# Patient Record
Sex: Female | Born: 1935 | Race: White | Hispanic: No | Smoking: Never smoker
Health system: Southern US, Community
[De-identification: ages and names within clinical notes are randomized; demographics above are authoritative.]

## PROBLEM LIST (undated history)

## (undated) ENCOUNTER — Emergency Department (HOSPITAL_COMMUNITY): Payer: PPO

## (undated) DIAGNOSIS — J189 Pneumonia, unspecified organism: Secondary | ICD-10-CM

## (undated) DIAGNOSIS — G47419 Narcolepsy without cataplexy: Secondary | ICD-10-CM

## (undated) DIAGNOSIS — D649 Anemia, unspecified: Secondary | ICD-10-CM

## (undated) DIAGNOSIS — E669 Obesity, unspecified: Secondary | ICD-10-CM

## (undated) DIAGNOSIS — G63 Polyneuropathy in diseases classified elsewhere: Secondary | ICD-10-CM

## (undated) DIAGNOSIS — S4292XA Fracture of left shoulder girdle, part unspecified, initial encounter for closed fracture: Secondary | ICD-10-CM

## (undated) DIAGNOSIS — G629 Polyneuropathy, unspecified: Secondary | ICD-10-CM

## (undated) HISTORY — DX: Fracture of left shoulder girdle, part unspecified, initial encounter for closed fracture: S42.92XA

## (undated) HISTORY — PX: MOUTH SURGERY: SHX715

## (undated) HISTORY — DX: Narcolepsy without cataplexy: G47.419

## (undated) HISTORY — DX: Polyneuropathy in diseases classified elsewhere: G63

## (undated) HISTORY — DX: Obesity, unspecified: E66.9

## (undated) HISTORY — DX: Polyneuropathy, unspecified: G62.9

## (undated) HISTORY — DX: Anemia, unspecified: D64.9

## (undated) HISTORY — DX: Pneumonia, unspecified organism: J18.9

## (undated) HISTORY — PX: NASAL SINUS SURGERY: SHX719

---

## 1998-05-17 ENCOUNTER — Emergency Department (HOSPITAL_COMMUNITY): Admission: EM | Admit: 1998-05-17 | Discharge: 1998-05-17 | Payer: Self-pay | Admitting: Emergency Medicine

## 1998-05-17 ENCOUNTER — Inpatient Hospital Stay (HOSPITAL_COMMUNITY): Admission: AD | Admit: 1998-05-17 | Discharge: 1998-05-17 | Payer: Self-pay | Admitting: Obstetrics

## 2005-01-16 ENCOUNTER — Ambulatory Visit: Payer: Self-pay | Admitting: Internal Medicine

## 2009-02-08 ENCOUNTER — Emergency Department (HOSPITAL_COMMUNITY): Admission: EM | Admit: 2009-02-08 | Discharge: 2009-02-08 | Payer: Self-pay | Admitting: Emergency Medicine

## 2009-05-07 ENCOUNTER — Encounter: Admission: RE | Admit: 2009-05-07 | Discharge: 2009-05-07 | Payer: Self-pay | Admitting: Orthopaedic Surgery

## 2010-03-22 ENCOUNTER — Encounter: Admission: RE | Admit: 2010-03-22 | Discharge: 2010-03-22 | Payer: Self-pay | Admitting: Family Medicine

## 2010-03-24 ENCOUNTER — Encounter: Admission: RE | Admit: 2010-03-24 | Discharge: 2010-03-24 | Payer: Self-pay | Admitting: Family Medicine

## 2011-02-01 LAB — POCT CARDIAC MARKERS
CKMB, poc: 2 ng/mL (ref 1.0–8.0)
Myoglobin, poc: 87.8 ng/mL (ref 12–200)
Troponin i, poc: <0.05 ng/mL (ref 0.00–0.09)

## 2011-02-01 LAB — CBC
HCT: 35 % — ABNORMAL LOW (ref 36.0–46.0)
Hemoglobin: 11.3 g/dL — ABNORMAL LOW (ref 12.0–15.0)
MCHC: 32.4 g/dL (ref 30.0–36.0)
MCV: 75.9 fL — ABNORMAL LOW (ref 78.0–100.0)
Platelets: 359 10*3/uL (ref 150–400)
RBC: 4.61 MIL/uL (ref 3.87–5.11)
RDW: 17.2 % — ABNORMAL HIGH (ref 11.5–15.5)
WBC: 6.2 10*3/uL (ref 4.0–10.5)

## 2011-02-01 LAB — BASIC METABOLIC PANEL
BUN: 12 mg/dL (ref 6–23)
CO2: 24 mEq/L (ref 19–32)
Calcium: 9 mg/dL (ref 8.4–10.5)
Chloride: 105 mEq/L (ref 96–112)
Creatinine, Ser: 0.79 mg/dL (ref 0.4–1.2)
GFR calc Af Amer: >60 mL/min (ref >60)
GFR calc non Af Amer: >60 mL/min (ref >60)
Glucose, Bld: 97 mg/dL (ref 70–99)
Potassium: 3.9 mEq/L (ref 3.5–5.1)
Sodium: 136 mEq/L (ref 135–145)

## 2011-02-01 LAB — DIFFERENTIAL
Basophils Absolute: 0 10*3/uL (ref 0.0–0.1)
Basophils Relative: 0 % (ref 0–1)
Eosinophils Absolute: 0.2 10*3/uL (ref 0.0–0.7)
Eosinophils Relative: 4 % (ref 0–5)
Lymphocytes Relative: 30 % (ref 12–46)
Lymphs Abs: 1.8 10*3/uL (ref 0.7–4.0)
Monocytes Absolute: 0.3 10*3/uL (ref 0.1–1.0)
Monocytes Relative: 5 % (ref 3–12)
Neutro Abs: 3.8 10*3/uL (ref 1.7–7.7)
Neutrophils Relative %: 61 % (ref 43–77)

## 2011-02-01 LAB — D-DIMER, QUANTITATIVE: D-Dimer, Quant: 0.48 ug/mL-FEU (ref 0.00–0.48)

## 2011-07-26 ENCOUNTER — Emergency Department (HOSPITAL_COMMUNITY): Payer: Medicare Other

## 2011-07-26 ENCOUNTER — Emergency Department (HOSPITAL_COMMUNITY)
Admission: EM | Admit: 2011-07-26 | Discharge: 2011-07-26 | Disposition: A | Discharge status: Home / Self Care | Payer: Medicare Other | Attending: Emergency Medicine | Admitting: Emergency Medicine

## 2011-07-26 DIAGNOSIS — Y93K1 Activity, walking an animal: Secondary | ICD-10-CM | POA: Insufficient documentation

## 2011-07-26 DIAGNOSIS — M79609 Pain in unspecified limb: Secondary | ICD-10-CM | POA: Insufficient documentation

## 2011-07-26 DIAGNOSIS — T148XXA Other injury of unspecified body region, initial encounter: Secondary | ICD-10-CM | POA: Insufficient documentation

## 2011-07-26 DIAGNOSIS — Y92009 Unspecified place in unspecified non-institutional (private) residence as the place of occurrence of the external cause: Secondary | ICD-10-CM | POA: Insufficient documentation

## 2011-07-26 DIAGNOSIS — M25539 Pain in unspecified wrist: Secondary | ICD-10-CM | POA: Insufficient documentation

## 2011-07-26 DIAGNOSIS — W101XXA Fall (on)(from) sidewalk curb, initial encounter: Secondary | ICD-10-CM | POA: Insufficient documentation

## 2013-04-15 ENCOUNTER — Ambulatory Visit: Payer: Medicare Other | Attending: Family Medicine | Admitting: Internal Medicine

## 2013-04-15 ENCOUNTER — Encounter: Payer: Self-pay | Admitting: Internal Medicine

## 2013-04-15 DIAGNOSIS — M79609 Pain in unspecified limb: Secondary | ICD-10-CM

## 2013-04-15 MED ORDER — DOXYCYCLINE HYCLATE 100 MG PO TABS: 100.0000 mg | ORAL_TABLET | Freq: Two times a day (BID) | ORAL | Status: DC

## 2013-04-15 MED ORDER — TRAMADOL HCL 50 MG PO TABS: 50.0000 mg | ORAL_TABLET | Freq: Four times a day (QID) | ORAL | Status: DC | PRN

## 2013-04-15 NOTE — Progress Notes (Signed)
Patient ID: Krystal Moore, female   DOB: 09-23-1930, 80 y.o.   MRN: [id number]   CC: Erythema in feet  WUJ:[id number] is 80 year old female who presents clinic with sudden onset of bilateral feet swelling associated with redness and tenderness to palpation, discomfort she describes is intermittent, dull, nonradiating, worse with ambulation, somewhat relieved with rest. Patient denies similar episodes in the past. She reports intermittent episodes of numbness and tingling in feet. She also reports that her feet feel warm to touch. She denies any trauma to the feet, no fevers or chills.  No Known Allergies No significant past medical history  No current outpatient prescriptions on file prior to visit.   No current facility-administered medications on file prior to visit.   No family history on file. History   Social History  . Marital Status: Widowed    Spouse Name: N/A    Number of Children: N/A  . Years of Education: N/A   Occupational History  . Not on file.   Social History Main Topics  . Smoking status: Never Smoker   . Smokeless tobacco: Not on file  . Alcohol Use: No  . Drug Use: Not on file  . Sexually Active: Not on file   Other Topics Concern  . Not on file   Social History Narrative  . No narrative on file    Review of Systems  Constitutional: Negative for fever, chills, diaphoresis, activity change, appetite change and fatigue.  HENT: Negative for ear pain, nosebleeds, congestion, facial swelling, rhinorrhea, neck pain, neck stiffness and ear discharge.   Eyes: Negative for pain, discharge, redness, itching and visual disturbance.  Respiratory: Negative for cough, choking, chest tightness, shortness of breath, wheezing and stridor.   Cardiovascular: Negative for chest pain, palpitations.  Gastrointestinal: Negative for abdominal distention.  Genitourinary: Negative for dysuria, urgency, frequency, hematuria, flank pain, decreased urine volume, difficulty  urinating and dyspareunia.  Musculoskeletal: Per history of present illness  Neurological: Negative for dizziness, tremors, seizures, syncope, facial asymmetry, speech difficulty, weakness, light-headedness, numbness and headaches.  Hematological: Negative for adenopathy. Does not bruise/bleed easily.  Psychiatric/Behavioral: Negative for hallucinations, behavioral problems, confusion, dysphoric mood, decreased concentration and agitation.    Objective:   Filed Vitals:   04/15/13 1541  BP: 163/80  Pulse: 81  Temp: 98.5 F (36.9 C)    Physical Exam  Constitutional: Appears well-developed and well-nourished. No distress.   CVS: RRR, S1/S2 +, no murmurs, no gallops, no carotid bruit.  Pulmonary: Effort and breath sounds normal, no stridor, rhonchi, wheezes, rales.  Abdominal: Soft. BS +,  no distension, tenderness, rebound or guarding.  Musculoskeletal: Normal range of motion. Bilateral feet edema and erythema, tenderness and warmth to palpation, poor peripheral pulses, I have difficulty palpating dorsalis pedis and posterior tibialis pulses  Neuro: Alert. Normal reflexes, muscle tone coordination. No cranial nerve deficit.   Lab Results  Component Value Date   WBC 6.2 02/08/2009   HGB 11.3* 02/08/2009   HCT 35.0* 02/08/2009   MCV 75.9* 02/08/2009   PLT 359 02/08/2009   Lab Results  Component Value Date   CREATININE 0.79 02/08/2009   BUN 12 02/08/2009   NA 136 02/08/2009   K 3.9 02/08/2009   CL 105 02/08/2009   CO2 24 02/08/2009    No results found for this basename: HGBA1C   Lipid Panel  No results found for this basename: chol, trig, hdl, cholhdl, vldl, ldlcalc      Assessment and plan:    bilateral feet swelling  and erythema  - Symptoms most likely related to ongoing cellulitis of unclear etiology, will prescribe course of antibiotic doxycycline, patient advised to come back and see [place] if her symptoms get worse or do not improve  - Since I was not able to palpate for solids  pedis or posterior tibialis pulses I will send patient to have ABI done in both legs at [facility] Radiology department June 25th, 2014 at 10 am

## 2013-04-15 NOTE — Patient Instructions (Signed)
Cellulitis Cellulitis is an infection of the skin and the tissue beneath it. The infected area is usually red and tender. Cellulitis occurs most often in the arms and lower legs.  CAUSES  Cellulitis is caused by bacteria that enter the skin through cracks or cuts in the skin. The most common types of bacteria that cause cellulitis are Staphylococcus and Streptococcus. SYMPTOMS   Redness and warmth.  Swelling.  Tenderness or pain.  Fever. DIAGNOSIS  Your caregiver can usually determine what is wrong based on a physical exam. Blood tests may also be done. TREATMENT  Treatment usually involves taking an antibiotic medicine. HOME CARE INSTRUCTIONS   Take your antibiotics as directed. Finish them even if you start to feel better.  Keep the infected arm or leg elevated to reduce swelling.  Apply a warm cloth to the affected area up to 4 times per day to relieve pain.  Only take over-the-counter or prescription medicines for pain, discomfort, or fever as directed by your caregiver.  Keep all follow-up appointments as directed by your caregiver. SEEK MEDICAL CARE IF:   You notice red streaks coming from the infected area.  Your red area gets larger or turns dark in color.  Your bone or joint underneath the infected area becomes painful after the skin has healed.  Your infection returns in the same area or another area.  You notice a swollen bump in the infected area.  You develop new symptoms. SEEK IMMEDIATE MEDICAL CARE IF:   You have a fever.  You feel very sleepy.  You develop vomiting or diarrhea.  You have a general ill feeling (malaise) with muscle aches and pains. MAKE SURE YOU:   Understand these instructions.  Will watch your condition.  Will get help right away if you are not doing well or get worse. Document Released: 07/19/2005 Document Revised: 04/09/2012 Document Reviewed: 12/25/2011 [employer] Patient Information 2014 [employer].  

## 2013-04-16 ENCOUNTER — Ambulatory Visit (HOSPITAL_COMMUNITY)
Admission: RE | Admit: 2013-04-16 | Discharge: 2013-04-16 | Disposition: A | Discharge status: Home / Self Care | Payer: Medicare Other | Source: Ambulatory Visit | Attending: Internal Medicine | Admitting: Internal Medicine

## 2013-04-16 DIAGNOSIS — M79609 Pain in unspecified limb: Secondary | ICD-10-CM

## 2013-04-16 DIAGNOSIS — R209 Unspecified disturbances of skin sensation: Secondary | ICD-10-CM | POA: Insufficient documentation

## 2013-04-16 NOTE — Progress Notes (Signed)
VASCULAR LAB PRELIMINARY  PRELIMINARY  PRELIMINARY  PRELIMINARY  VASCULAR LAB PRELIMINARY  ARTERIAL  ABI completed:    RIGHT    LEFT    PRESSURE WAVEFORM  PRESSURE WAVEFORM  BRACHIAL 137 Triphasic BRACHIAL 123  Triphasic  DP 139 Triphasic DP 131 Triphsic  PT 136 Triphasic PT 136 Triphasic  GREAT TOE 91  GREAT TOE 90     RIGHT LEFT  ABI 1.01 0.99   ABIs indicate normal arterial flow with normal Doppler waveforms bilaterally. Great toe pressures indicate adequate perfusion bilaterally.   Derrius Furtick, RVS 04/16/2013, 11:55 AM

## 2013-05-16 ENCOUNTER — Encounter: Payer: Self-pay | Admitting: Family Medicine

## 2013-05-16 ENCOUNTER — Other Ambulatory Visit: Payer: Self-pay | Admitting: Internal Medicine

## 2013-05-16 ENCOUNTER — Ambulatory Visit: Payer: Medicare Other | Attending: Family Medicine | Admitting: Internal Medicine

## 2013-05-16 VITALS — BP 137/70 | HR 88 | Temp 98.6°F | Resp 16 | Wt 182.4 lb

## 2013-05-16 DIAGNOSIS — G629 Polyneuropathy, unspecified: Secondary | ICD-10-CM | POA: Insufficient documentation

## 2013-05-16 DIAGNOSIS — R209 Unspecified disturbances of skin sensation: Secondary | ICD-10-CM | POA: Insufficient documentation

## 2013-05-16 DIAGNOSIS — G609 Hereditary and idiopathic neuropathy, unspecified: Secondary | ICD-10-CM

## 2013-05-16 LAB — HEMOGLOBIN A1C
Hgb A1c MFr Bld: 5.5 % (ref <5.7)
Mean Plasma Glucose: 111 mg/dL (ref <117)

## 2013-05-16 MED ORDER — GABAPENTIN 100 MG PO CAPS: 100.0000 mg | ORAL_CAPSULE | Freq: Three times a day (TID) | ORAL | Status: DC

## 2013-05-16 NOTE — Progress Notes (Signed)
Patient ID: Krystal Moore, female   DOB: 05-14-1930, 79 y.o.   MRN: [id number] Patient Demographics  Krystal Moore, is a 79 y.o. female  WUJ:[id number]  [id number]  DOB - 05-Feb-1930  Chief Complaint  Patient presents with  . Foot Swelling        Subjective:   Krystal Moore today is here for a follow up visit. The patient reported that she still continues to have bilateral feet tingling all the time. Somewhat relieved with rest and elevating feet at night. Denies any similar episodes in the past. Patient reports that this is been going on since June. She denies any focal weakness, headache or any other neurological deficits.   Patient has No headache, No chest pain, No abdominal pain - No Nausea, No Cough - SOB.    Objective:    Filed Vitals:   05/16/13 1535  BP: 137/70  Pulse: 88  Temp: 98.6 F (37 C)  Resp: 16  Weight: 182 lb 6.4 oz (82.736 kg)  SpO2: 98%     ALLERGIES:  No Known Allergies  PAST MEDICAL HISTORY: History reviewed. No pertinent past medical history.  MEDICATIONS AT HOME: Prior to Admission medications   Medication Sig Start Date End Date Taking? Authorizing Provider  vitamin B-12 (CYANOCOBALAMIN) 1000 MCG tablet Take 2,500 mcg by mouth daily.   Yes Historical Provider, MD  doxycycline (VIBRA-TABS) 100 MG tablet Take 1 tablet (100 mg total) by mouth 2 (two) times daily. 04/15/13   Dorothea Ogle, MD  gabapentin (NEURONTIN) 100 MG capsule Take 1 capsule (100 mg total) by mouth 3 (three) times daily. 05/16/13   Khai Torbert Jenna Luo, MD  traMADol (ULTRAM) 50 MG tablet Take 1 tablet (50 mg total) by mouth every 6 (six) hours as needed for pain. 04/15/13   Dorothea Ogle, MD     Exam  General appearance :Awake, alert, NAD, Speech Clear.  HEENT: Atraumatic and Normocephalic, PERLA Neck: supple, no JVD. No cervical lymphadenopathy.  Chest: Clear to auscultation bilaterally, no wheezing, rales or rhonchi CVS: S1 S2 regular, no  murmurs.  Abdomen: soft, NBS, NT, ND, no gaurding, rigidity or rebound. Extremities: no cyanosis or clubbing, B/L Lower Ext shows no edema Neurology: Awake alert, and oriented X 3, CN II-XII intact, Non focal Skin: No Rash or lesions Wounds:N/A    Data Review   Basic Metabolic Panel: No results found for this basename: NA, K, CL, CO2, GLUCOSE, BUN, CREATININE, CALCIUM, MG, PHOS,  in the last 168 hours Liver Function Tests: No results found for this basename: AST, ALT, ALKPHOS, BILITOT, PROT, ALBUMIN,  in the last 168 hours  CBC: No results found for this basename: WBC, NEUTROABS, HGB, HCT, MCV, PLT,  in the last 168 hours  ------------------------------------------------------------------------------------------------------------------ No results found for this basename: HGBA1C,  in the last 72 hours ------------------------------------------------------------------------------------------------------------------ No results found for this basename: CHOL, HDL, LDLCALC, TRIG, CHOLHDL, LDLDIRECT,  in the last 72 hours ------------------------------------------------------------------------------------------------------------------ No results found for this basename: TSH, T4TOTAL, FREET3, T3FREE, THYROIDAB,  in the last 72 hours ------------------------------------------------------------------------------------------------------------------ No results found for this basename: VITAMINB12, FOLATE, FERRITIN, TIBC, IRON, RETICCTPCT,  in the last 72 hours  Coagulation profile  No results found for this basename: INR, PROTIME,  in the last 168 hours    Assessment & Plan   Active Problems: Bilateral paresthesias of the feet: Likely due to perpheral neuropathy of unclear etiology. The patient had vascular studies ABI done, Right 1.0, left 0.99. - Will obtain B12 level, folate, TSH, hemoglobin  A1c, ANA, RPR for further workup - Patient takes vitamin B12 2500 mg daily - Started patient on  Neurontin 100 mg 3 times a day - Will send neurology referral, patient may need EMG or nerve conduction studies. - Patient reports that her BP usually runs low, hence will not start on any diuretic for the slight peripheral edema on the feet and ankles - Told the patient to be a TED hoses and at night, keep the feet elevated on a pillow  Recommendations: Will follow the labs Follow-up in 2 months or earlier if needed depending on the labs.    Cyani Kallstrom M.D. 05/16/2013, 4:29 PM

## 2013-05-16 NOTE — Progress Notes (Signed)
Patient states she has been having some swelling to her feet and  ankles

## 2013-05-17 LAB — FOLATE: Folate: >20 ng/mL

## 2013-05-17 LAB — RPR

## 2013-05-17 LAB — VITAMIN B12: Vitamin B-12: 1715 pg/mL — ABNORMAL HIGH (ref 211–911)

## 2013-05-17 LAB — TSH: TSH: 6.363 u[IU]/mL — ABNORMAL HIGH (ref 0.350–4.500)

## 2013-05-19 LAB — ANA: Anti Nuclear Antibody(ANA): NEGATIVE

## 2013-05-23 ENCOUNTER — Telehealth: Payer: Self-pay

## 2013-05-23 LAB — T3: T3, Total: 85.2 ng/dL (ref 80.0–204.0)

## 2013-05-23 LAB — T4, FREE: Free T4: 0.8 ng/dL (ref 0.80–1.80)

## 2013-05-23 NOTE — Telephone Encounter (Signed)
Message copied by Lestine Mount on Fri May 23, 2013 10:40 AM ------      Message from: RAI, RIPUDEEP K      Created: Fri May 23, 2013  7:35 AM       Please order T3, free T4 ------

## 2013-05-23 NOTE — Telephone Encounter (Signed)
Message copied by Lestine Mount on Fri May 23, 2013 10:38 AM ------      Message from: RAI, RIPUDEEP K      Created: Fri May 23, 2013  7:35 AM       Please order T3, free T4 ------

## 2013-05-23 NOTE — Progress Notes (Signed)
Quick Note:  Please order T3, free T4 ______

## 2013-05-23 NOTE — Telephone Encounter (Signed)
Called [facility] to add T3 and Free T4 Spoke with latisha  If there is a problem adding - she wil call back If that is the case we will need to get patient back in to draw

## 2013-06-13 ENCOUNTER — Encounter: Payer: Self-pay | Admitting: Neurology

## 2013-06-13 ENCOUNTER — Ambulatory Visit (INDEPENDENT_AMBULATORY_CARE_PROVIDER_SITE_OTHER): Payer: Medicare Other | Admitting: Neurology

## 2013-06-13 VITALS — BP 124/67 | HR 84 | Ht 62.25 in | Wt 176.0 lb

## 2013-06-13 DIAGNOSIS — G609 Hereditary and idiopathic neuropathy, unspecified: Secondary | ICD-10-CM

## 2013-06-13 NOTE — Progress Notes (Signed)
Reason for visit: Numbness of the feet  Krystal Moore is a 79 y.o. female  History of present illness:  Krystal Moore is an 79 year old right-handed white female with a history of problems with numbness and tingling sensations of the feet that began 6 weeks ago. The patient noted the onset of the sensory symptoms concurrent with swelling in the feet and ankles. The patient has a tight bandlike sensation around the feet, and she has tingling in the toes and feet, and a lack of sensation. The patient has had some improvement in the degree of swelling, and the numbness has also improved gradually. The patient indicates that the numbness and the swelling are worse in the evenings, better in the mornings. The patient has difficulty sleeping because of the sensory alteration. The patient does not believe that her balance has been affected. The patient denies sensory symptoms in the hands. The patient denies neck pain or pain down the arms. The patient denies low back pain or pain down the legs. The patient denies problems controlling the bowels or the bladder, but she does have some constipation issues at times. The patient indicates that she feels better she is up and active. The patient has been placed on gabapentin, and she believes this has been somewhat helpful. The patient reports the gabapentin causes dizziness during the day however. The patient is sent to this office for an evaluation. Blood work that has included a vitamin B12 level, thyroid profile, ANA, has revealed a slightly low thyroid, but otherwise the studies were unremarkable. The patient had blisters on the feet initially, and this was treated with doxycycline, and this improved the skin problems.  Past Medical History  Diagnosis Date  . Polyneuropathy in other diseases classified elsewhere   . Narcolepsy   . Shoulder fracture, left     Past Surgical History  Procedure Laterality Date  . Nasal sinus surgery       Family History  Problem Relation Age of Onset  . Stroke Mother   . Heart attack Father     Social history:  reports that she has never smoked. She does not have any smokeless tobacco history on file. She reports that  drinks alcohol. Her drug history is not on file.  Medications:  Current Outpatient Prescriptions on File Prior to Visit  Medication Sig Dispense Refill  . gabapentin (NEURONTIN) 100 MG capsule Take 1 capsule (100 mg total) by mouth 3 (three) times daily.  90 capsule  3  . traMADol (ULTRAM) 50 MG tablet Take 1 tablet (50 mg total) by mouth every 6 (six) hours as needed for pain.  45 tablet  1  . vitamin B-12 (CYANOCOBALAMIN) 1000 MCG tablet Take 2,500 mcg by mouth daily.       No current facility-administered medications on file prior to visit.      Allergies  Allergen Reactions  . Sulfur   . Orange Juice [Orange Oil]   . Poison Ivy Extract [employer] Poison Ivy]   . Tobacco [Nicotiana Tabacum]     ROS:  Out of a complete 14 system review of symptoms, the patient complains only of the following symptoms, and all other reviewed systems are negative.  Weight gain  Swelling in the legs Skin rash, birthmarks, itching, moles Blurred vision Constipation Dizziness Numbness of the feet  Blood pressure 124/67, pulse 84, height 5' 2.25" (1.581 m), weight 176 lb (79.833 kg).  Physical Exam  General: The patient is alert and cooperative at the time  of the examination.  Head: Pupils are equal, round, and reactive to light. Discs are flat bilaterally.  Neck: The neck is supple, no carotid bruits are noted.  Respiratory: The respiratory examination is clear.  Cardiovascular: The cardiovascular examination reveals a regular rate and rhythm, no obvious murmurs or rubs are noted.  Skin: Extremities are without significant edema.  Neurologic Exam  Mental status:  Cranial nerves: Facial symmetry is present. There is good sensation of the face to pinprick and  soft touch bilaterally. The strength of the facial muscles and the muscles to head turning and shoulder shrug are normal bilaterally. Speech is well enunciated, no aphasia or dysarthria is noted. Extraocular movements are full. Visual fields are full.  Motor: The motor testing reveals 5 over 5 strength of all 4 extremities. Good symmetric motor tone is noted throughout.  Sensory: Sensory testing is intact to pinprick, soft touch, vibration sensation, and position sense on all 4 extremities, with the exception that there is a stocking pattern pinprick sensory deficit across the ankles bilaterally . No evidence of extinction is noted.  Coordination: Cerebellar testing reveals good finger-nose-finger and heel-to-shin bilaterally.  Gait and station: Gait is normal. Tandem gait is slightly unsteady. Romberg is negative. No drift is seen.  Reflexes: Deep tendon reflexes are symmetric, with the exception that the left ankle jerk reflexes slightly decreased as compared to the right. The ankle jerk reflexes are well-maintained bilaterally. . Toes are downgoing bilaterally.   Assessment/Plan:   1. Numbness of the feet  The patient has noted onset of some peripheral edema along with some sensory alterations in the feet. Ankle jerk reflexes are well-maintained, but the patient could potentially have a small fiber neuropathy. The patient will be set up for nerve conduction studies of both legs, and one arm. EMG study will be done on one leg. Further blood work will be done depending upon the results of the above. The patient is having some dizziness from the gabapentin, and I have indicated that she is to switch over to taking 2 capsules of gabapentin at night of the 100 mg dosing. The patient will not take medication during the day. The patient indicates that she functions well if he remains active. The patient will followup for the above study.  Marlan Palau MD 06/14/2013 8:39 AM  [facility] [address] [place], [place] [address]  Phone [phone] Fax [phone]

## 2013-06-26 ENCOUNTER — Ambulatory Visit (INDEPENDENT_AMBULATORY_CARE_PROVIDER_SITE_OTHER): Payer: Medicare Other | Admitting: Neurology

## 2013-06-26 ENCOUNTER — Encounter (INDEPENDENT_AMBULATORY_CARE_PROVIDER_SITE_OTHER): Payer: Self-pay

## 2013-06-26 DIAGNOSIS — G609 Hereditary and idiopathic neuropathy, unspecified: Secondary | ICD-10-CM

## 2013-06-26 DIAGNOSIS — R209 Unspecified disturbances of skin sensation: Secondary | ICD-10-CM

## 2013-06-26 DIAGNOSIS — Z0289 Encounter for other administrative examinations: Secondary | ICD-10-CM

## 2013-06-26 NOTE — Progress Notes (Signed)
Nerve conduction studies done on the right upper extremity, and both lower extremities were unremarkable. EMG of the right leg was unremarkable. The patient could have an early peripheral neuropathy or a small fiber neuropathy. Some blood work as are been done, further blood work will be done today. The patient is not currently bothered significantly by her sensory complaints. The patient does report some balance issues. The patient will followup if needed. I'll contact her when the blood work results are available. The patient reports no low back pain.

## 2013-06-26 NOTE — Procedures (Signed)
  HISTORY:  Krystal Moore is an 79 year old patient with a two-month history of numbness and tingly sensations in the feet. The patient is being evaluated for a possible neuropathy. The patient indicates that the onset of the sensory changes were concurrent with swelling in her feet. The patient denies that the numbness or to the sensations prevent her from sleeping. The patient does report some balance issues.   NERVE CONDUCTION STUDIES:  Nerve conduction studies were performed on the right upper extremity. The distal motor latencies and motor amplitudes for the median and ulnar nerves were within normal limits. The F wave latencies and nerve conduction velocities for these nerves were also normal. The sensory latencies for the median and ulnar nerves were normal.  Nerve conduction studies were performed on both lower extremities. The distal motor latencies and motor amplitudes for the peroneal and posterior tibial nerves were within normal limits. The nerve conduction velocities for these nerves were also normal. The H reflex latencies were normal. The sensory latencies for the peroneal nerves were within normal limits.   EMG STUDIES:  EMG study was performed on the right lower extremity:  The tibialis anterior muscle reveals 2 to 4K motor units with full recruitment. No fibrillations or positive waves were seen. The peroneus tertius muscle reveals 2 to 6K motor units with decreased recruitment. No fibrillations or positive waves were seen. The medial gastrocnemius muscle reveals 1 to 3K motor units with full recruitment. No fibrillations or positive waves were seen. The vastus lateralis muscle reveals 2 to 4K motor units with full recruitment. No fibrillations or positive waves were seen. The iliopsoas muscle reveals 2 to 4K motor units with full recruitment. No fibrillations or positive waves were seen. The biceps femoris muscle (long head) reveals 2 to 4K motor units with full  recruitment. No fibrillations or positive waves were seen. The lumbosacral paraspinal muscles were tested at 3 levels, and revealed no abnormalities of insertional activity at all 3 levels tested. There was good relaxation.   IMPRESSION:  Nerve conduction studies done on the right upper extremity and on both lower extremities were unremarkable. There is no evidence of a peripheral neuropathy. An early peripheral neuropathy, or a small fiber neuropathy may be missed by a standard nerve conduction studies however. Clinical correlation is required. EMG evaluation of the right lower extremity was relatively unremarkable, without evidence of an overlying of a lumbosacral radiculopathy.  Marlan Palau MD 06/26/2013 11:09 AM  [facility] [address] [place], [place] [address]  Phone [phone] Fax [phone]

## 2013-07-17 ENCOUNTER — Encounter: Payer: Self-pay | Admitting: Family Medicine

## 2013-07-17 ENCOUNTER — Ambulatory Visit: Payer: Medicare Other | Attending: Family Medicine | Admitting: Family Medicine

## 2013-07-17 VITALS — BP 126/75 | HR 59 | Temp 98.9°F | Resp 16 | Wt 174.0 lb

## 2013-07-17 DIAGNOSIS — K3189 Other diseases of stomach and duodenum: Secondary | ICD-10-CM

## 2013-07-17 DIAGNOSIS — G609 Hereditary and idiopathic neuropathy, unspecified: Secondary | ICD-10-CM | POA: Insufficient documentation

## 2013-07-17 DIAGNOSIS — K30 Functional dyspepsia: Secondary | ICD-10-CM

## 2013-07-17 DIAGNOSIS — R112 Nausea with vomiting, unspecified: Secondary | ICD-10-CM

## 2013-07-17 MED ORDER — OMEPRAZOLE 40 MG PO CPDR: 40.0000 mg | DELAYED_RELEASE_CAPSULE | Freq: Every day | ORAL | Status: DC

## 2013-07-17 NOTE — Progress Notes (Signed)
Pt is here for a f/u on numbness/tingly feet Has seen a neurologist Reports swelling has decreased but still feeling numbness/tingly Sxs increase w/activity Alert w/no signs of acute distress.

## 2013-07-17 NOTE — Patient Instructions (Signed)
Neuropathy Neuropathy means your peripheral nerves are not working normally. Peripheral nerves are the nerves outside the brain and spinal cord. Messages between the brain and the rest of the body do not work properly with peripheral nerve disorders. CAUSES There are many different causes of peripheral nerve disorders. These include:  Injury.   Infections.   Diabetes.   Vitamin deficiency.   Poor circulation.   Alcoholism.   Exposure to toxins.   Drug effects.   Tumors.   Kidney disease.  SYMPTOMS  Tingling, burning, pain, and numbness in the extremities.   Weakness and loss of muscle tone and size.  DIAGNOSIS Blood tests and special studies of nerve function may help confirm the diagnosis.  TREATMENT  Treatment includes adopting healthy life habits.   A good diet, vitamin supplements, and mild pain medicine may be needed.   Avoid known toxins such as alcohol, tobacco, and recreational drugs.   Anti-convulsant medicines are helpful in some types of neuropathy.  Make a follow-up appointment with your caregiver to be sure you are getting better with treatment.  SEEK IMMEDIATE MEDICAL CARE IF:   You have breathing problems.   You have severe or uncontrolled pain.   You notice extreme weakness or you feel faint.   You are not better after 1 week or if you have worse symptoms.  Document Released: 11/16/2004 Document Revised: 06/21/2011 Document Reviewed: 10/09/2005 [employer] Patient Information 2012 [employer]. 

## 2013-07-17 NOTE — Progress Notes (Signed)
Patient ID: Krystal Moore, female   DOB: [birth year], 79 y.o.   MRN: [id number]  CC: follow up   HPI: Pt has peripheral neuropathy.  Pt says that she saw a neurologist and had normal nerve conduction studies.   Pt does get good relief with gabapentin. Pt says that the edema in her legs and feet is better.  She was dizzy with gabapentin but neuro told her take it at night and she says it felt better taking it at night.  The patient also reports that she 7 a difficult time with acid reflux symptoms.  She reports that she has chronic nausea.  She reports that she's had some difficulty with maintaining her weight because she is nauseated all the time.  She reports that this has been going on for over a month.  She's never had a GI evaluation done.  She also reports that she's been taking home acid reflux remedies which only alleviate the symptoms for a short time.  Allergies  Allergen Reactions  . Sulfur   . Orange Juice [Orange Oil]   . Poison Ivy Extract [employer] Of Poison Ivy]   . Tobacco [Nicotiana Tabacum]    Past Medical History  Diagnosis Date  . Polyneuropathy in other diseases classified elsewhere   . Narcolepsy   . Shoulder fracture, left    Current Outpatient Prescriptions on File Prior to Visit  Medication Sig Dispense Refill  . aspirin 81 MG tablet Take 81 mg by mouth daily.      Marland Kitchen gabapentin (NEURONTIN) 100 MG capsule Take 1 capsule (100 mg total) by mouth 3 (three) times daily.  90 capsule  3  . traMADol (ULTRAM) 50 MG tablet Take 1 tablet (50 mg total) by mouth every 6 (six) hours as needed for pain.  45 tablet  1  . vitamin B-12 (CYANOCOBALAMIN) 1000 MCG tablet Take 2,500 mcg by mouth daily.       No current facility-administered medications on file prior to visit.   Family History  Problem Relation Age of Onset  . Stroke Mother   . Heart attack Father    History   Social History  . Marital Status: Widowed    Spouse Name: N/A    Number of Children: N/A  .  Years of Education: N/A   Occupational History  . Not on file.   Social History Main Topics  . Smoking status: Never Smoker   . Smokeless tobacco: Not on file  . Alcohol Use: Yes     Comment: 2 to 3 times a week  . Drug Use: Not on file  . Sexual Activity: Not on file   Other Topics Concern  . Not on file   Social History Narrative  . No narrative on file    Review of Systems  Constitutional: Negative for fever, chills, diaphoresis, activity change, appetite change and fatigue.  HENT: Negative for ear pain, nosebleeds, congestion, facial swelling, rhinorrhea, neck pain, neck stiffness and ear discharge.   Eyes: Negative for pain, discharge, redness, itching and visual disturbance.  Respiratory: Negative for cough, choking, chest tightness, shortness of breath, wheezing and stridor.   Cardiovascular: Negative for chest pain, palpitations and leg swelling.  Gastrointestinal: mild abdominal distention and bloating of stomack.  Genitourinary: Negative for dysuria, urgency, frequency, hematuria, flank pain, decreased urine volume, difficulty urinating and dyspareunia.  Musculoskeletal: Negative for back pain, joint swelling, arthralgias and gait problem.  Neurological: Numbness of the feet and legs.  Negative for dizziness, tremors, seizures,  syncope, facial asymmetry, speech difficulty, weakness, light-headedness, and headaches.  Hematological: Negative for adenopathy. Does not bruise/bleed easily.  Psychiatric/Behavioral: Negative for hallucinations, behavioral problems, confusion, dysphoric mood, decreased concentration and agitation.    Objective:   Filed Vitals:   07/17/13 1030  BP: 126/75  Pulse: 59  Temp: 98.9 F (37.2 C)  Resp: 16    Physical Exam  Constitutional: Appears well-developed and well-nourished. No distress.  HENT: Normocephalic. External right and left ear normal. Oropharynx is clear and moist.  Eyes: Conjunctivae and EOM are normal. PERRLA, no scleral  icterus.  Neck: Normal ROM. Neck supple. No JVD. No tracheal deviation. No thyromegaly.  CVS: RRR, S1/S2 +, no murmurs, no gallops, no carotid bruit.  Pulmonary: Effort and breath sounds normal, no stridor, rhonchi, wheezes, rales.  Abdominal: Soft. BS +,  no distension, tenderness, rebound or guarding.  Musculoskeletal: Normal range of motion. No edema and no tenderness.  Lymphadenopathy: No lymphadenopathy noted, cervical, inguinal. Neuro: Alert. Normal reflexes, muscle tone coordination. No cranial nerve deficit. Skin: Skin is warm and dry. No rash noted. Not diaphoretic. No erythema. No pallor.  Psychiatric: Normal mood and affect. Behavior, judgment, thought content normal.   Lab Results  Component Value Date   WBC 6.2 02/08/2009   HGB 11.3* 02/08/2009   HCT 35.0* 02/08/2009   MCV 75.9* 02/08/2009   PLT 359 02/08/2009   Lab Results  Component Value Date   CREATININE 0.79 02/08/2009   BUN 12 02/08/2009   NA 136 02/08/2009   K 3.9 02/08/2009   CL 105 02/08/2009   CO2 24 02/08/2009    Lab Results  Component Value Date   HGBA1C 5.5 05/16/2013   Lipid Panel  No results found for this basename: chol, trig, hdl, cholhdl, vldl, ldlcalc      Assessment and plan:   Patient Active Problem List   Diagnosis Date Noted  . Unspecified peripheral neuropathy 05/16/2013   Peripheral edema - resolved now  Pt encouraged to continue gabapentin 200 mg po QHS as this does alleviate her symptoms of neuropathy.  Follow up with neurology for further testing Continue B12 supplementation.  I reviewed neurology consultation notes with patient today.  Pt will get her flu vaccine at [facility] later today.    The patient was given a prescription for omeprazole 40 mg by mouth daily every morning with breakfast.  Also I made a referral for her to see a gastroenterologist.  She likely is going to require an upper endoscopy.  RTC in 2 months  The patient was given clear instructions to go to ER or  return to medical center if symptoms don't improve, worsen or new problems develop.  The patient verbalized understanding.  The patient was told to call to get any lab results if not heard anything in the next week.    Rodney Langton, MD, CDE, FAAFP [facility] [place], [place]

## 2013-07-27 ENCOUNTER — Emergency Department (HOSPITAL_COMMUNITY)
Admission: EM | Admit: 2013-07-27 | Discharge: 2013-07-27 | Disposition: A | Discharge status: Home / Self Care | Payer: Medicare Other | Attending: Emergency Medicine | Admitting: Emergency Medicine

## 2013-07-27 ENCOUNTER — Encounter (HOSPITAL_COMMUNITY): Payer: Self-pay | Admitting: Emergency Medicine

## 2013-07-27 DIAGNOSIS — Z87828 Personal history of other (healed) physical injury and trauma: Secondary | ICD-10-CM | POA: Insufficient documentation

## 2013-07-27 DIAGNOSIS — K5900 Constipation, unspecified: Secondary | ICD-10-CM | POA: Insufficient documentation

## 2013-07-27 DIAGNOSIS — Z79899 Other long term (current) drug therapy: Secondary | ICD-10-CM | POA: Insufficient documentation

## 2013-07-27 DIAGNOSIS — Z7982 Long term (current) use of aspirin: Secondary | ICD-10-CM | POA: Insufficient documentation

## 2013-07-27 DIAGNOSIS — Z8669 Personal history of other diseases of the nervous system and sense organs: Secondary | ICD-10-CM | POA: Insufficient documentation

## 2013-07-27 DIAGNOSIS — K5641 Fecal impaction: Secondary | ICD-10-CM | POA: Insufficient documentation

## 2013-07-27 DIAGNOSIS — K602 Anal fissure, unspecified: Secondary | ICD-10-CM | POA: Insufficient documentation

## 2013-07-27 LAB — COMPREHENSIVE METABOLIC PANEL
ALT: 15 U/L (ref 0–35)
AST: 25 U/L (ref 0–37)
Albumin: 3.7 g/dL (ref 3.5–5.2)
Alkaline Phosphatase: 67 U/L (ref 39–117)
BUN: 14 mg/dL (ref 6–23)
CO2: 25 mEq/L (ref 19–32)
Calcium: 9.4 mg/dL (ref 8.4–10.5)
Chloride: 100 mEq/L (ref 96–112)
Creatinine, Ser: 0.91 mg/dL (ref 0.50–1.10)
GFR calc Af Amer: 67 mL/min — ABNORMAL LOW (ref >90)
GFR calc non Af Amer: 58 mL/min — ABNORMAL LOW (ref >90)
Glucose, Bld: 107 mg/dL — ABNORMAL HIGH (ref 70–99)
Potassium: 4 mEq/L (ref 3.5–5.1)
Sodium: 135 mEq/L (ref 135–145)
Total Bilirubin: 0.2 mg/dL — ABNORMAL LOW (ref 0.3–1.2)
Total Protein: 7.2 g/dL (ref 6.0–8.3)

## 2013-07-27 LAB — CBC WITH DIFFERENTIAL/PLATELET
Basophils Absolute: 0 10*3/uL (ref 0.0–0.1)
Basophils Relative: 1 % (ref 0–1)
Eosinophils Absolute: 0.3 10*3/uL (ref 0.0–0.7)
Eosinophils Relative: 5 % (ref 0–5)
HCT: 35.1 % — ABNORMAL LOW (ref 36.0–46.0)
Hemoglobin: 11.7 g/dL — ABNORMAL LOW (ref 12.0–15.0)
Lymphocytes Relative: 26 % (ref 12–46)
Lymphs Abs: 1.5 10*3/uL (ref 0.7–4.0)
MCH: 26.8 pg (ref 26.0–34.0)
MCHC: 33.3 g/dL (ref 30.0–36.0)
MCV: 80.5 fL (ref 78.0–100.0)
Monocytes Absolute: 0.4 10*3/uL (ref 0.1–1.0)
Monocytes Relative: 7 % (ref 3–12)
Neutro Abs: 3.6 10*3/uL (ref 1.7–7.7)
Neutrophils Relative %: 61 % (ref 43–77)
Platelets: 328 10*3/uL (ref 150–400)
RBC: 4.36 MIL/uL (ref 3.87–5.11)
RDW: 15.9 % — ABNORMAL HIGH (ref 11.5–15.5)
WBC: 5.8 10*3/uL (ref 4.0–10.5)

## 2013-07-27 LAB — URINALYSIS, ROUTINE W REFLEX MICROSCOPIC
Bilirubin Urine: NEGATIVE
Glucose, UA: NEGATIVE mg/dL
Hgb urine dipstick: NEGATIVE
Ketones, ur: NEGATIVE mg/dL
Nitrite: NEGATIVE
Protein, ur: NEGATIVE mg/dL
Specific Gravity, Urine: 1.021 (ref 1.005–1.030)
Urobilinogen, UA: 0.2 mg/dL (ref 0.0–1.0)
pH: 5.5 (ref 5.0–8.0)

## 2013-07-27 LAB — URINE MICROSCOPIC-ADD ON

## 2013-07-27 LAB — LIPASE, BLOOD: Lipase: 34 U/L (ref 11–59)

## 2013-07-27 MED ORDER — PEG 3350-KCL-NABCB-NACL-NASULF 236 G PO SOLR: 4.0000 L | Freq: Once | ORAL | Status: DC

## 2013-07-27 MED ORDER — FLEET ENEMA 7-19 GM/118ML RE ENEM
1.0000 | ENEMA | Freq: Once | RECTAL | Status: AC
  Administered 2013-07-27: 1 via RECTAL
  Filled 2013-07-27: qty 1

## 2013-07-27 NOTE — ED Notes (Signed)
Pt states that on Thursday she started taking an herbal laxative, but has been unable to have a bowel movement since Thursday. Pt states that she used a suppository tonight and then started having rectal bleeding.

## 2013-07-27 NOTE — ED Provider Notes (Signed)
CSN: [id number]     Arrival date & time 07/27/13  1924 History   First MD Initiated Contact with Patient 07/27/13 2028     Chief Complaint  Patient presents with  . Rectal Bleeding   (Consider location/radiation/quality/duration/timing/severity/associated sxs/prior Treatment) HPI This 79 year old female has a gradual onset of constipation over the last 2 months, she used to have 1-2 soft formed movements per day however over the last 2 months is now having hard small movements once every 2 days or so, she has not had a bowel movement in 3 days until today had a small painful hard bowel movement and noticed some bright red blood parenchyma with tenderness when she had a bowel movement today, she is no lightheadedness or chest pain no shortness of breath no abdominal pain no black or bloody stools, she just has some small amount bright blood around brown stool. She has not had a colonoscopy in the past. She's been taking over-the-counter natural laxative without improvement. Past Medical History  Diagnosis Date  . Polyneuropathy in other diseases classified elsewhere   . Narcolepsy   . Shoulder fracture, left    Past Surgical History  Procedure Laterality Date  . Nasal sinus surgery     Family History  Problem Relation Age of Onset  . Stroke Mother   . Heart attack Father    History  Substance Use Topics  . Smoking status: Never Smoker   . Smokeless tobacco: Not on file  . Alcohol Use: Yes     Comment: 2 to 3 times a week   OB History   Grav Para Term Preterm Abortions TAB SAB Ect Mult Living                 Review of Systems 10 Systems reviewed and are negative for acute change except as noted in the HPI. Allergies  Sulfur; Tobacco; Orange juice; and Poison ivy extract  Home Medications   Current Outpatient Rx  Name  Route  Sig  Dispense  Refill  . aspirin 81 MG tablet   Oral   Take 81 mg by mouth daily.         . Bisacodyl (WOMENS LAXATIVE PO)   Oral   Take 1  tablet by mouth daily.         Marland Kitchen gabapentin (NEURONTIN) 100 MG capsule   Oral   Take 1 capsule (100 mg total) by mouth 3 (three) times daily.   90 capsule   3   . omeprazole (PRILOSEC) 40 MG capsule   Oral   Take 1 capsule (40 mg total) by mouth daily.   30 capsule   3   . OVER THE COUNTER MEDICATION   Oral   Take 2 tablets by mouth daily. Swiss kriss herbal laxative         . vitamin B-12 (CYANOCOBALAMIN) 1000 MCG tablet   Oral   Take 2,500 mcg by mouth daily.         . polyethylene glycol (GOLYTELY) 236 G solution   Oral   Take 4,000 mLs by mouth once.   4000 mL   0   . traMADol (ULTRAM) 50 MG tablet   Oral   Take 1 tablet (50 mg total) by mouth every 6 (six) hours as needed for pain.   45 tablet   1    BP 95/70  Pulse 68  Temp(Src) 98 F (36.7 C) (Oral)  Resp 16  SpO2 99% Physical Exam  Nursing note and vitals  reviewed. Constitutional:  Awake, alert, nontoxic appearance.  HENT:  Head: Atraumatic.  Eyes: Right eye exhibits no discharge. Left eye exhibits no discharge.  Neck: Neck supple.  Cardiovascular: Normal rate and regular rhythm.   No murmur heard. Pulmonary/Chest: Effort normal and breath sounds normal. No respiratory distress. She has no wheezes. She has no rales. She exhibits no tenderness.  Abdominal: Soft. Bowel sounds are normal. She exhibits no distension and no mass. There is no tenderness. There is no rebound and no guarding.  Musculoskeletal: She exhibits no tenderness.  Baseline ROM, no obvious new focal weakness.  Neurological:  Mental status and motor strength appears baseline for patient and situation.  Skin: No rash noted.  Psychiatric: She has a normal mood and affect.    ED Course  Procedures (including critical care time) Chaperone present for successful manual disimpaction by myself with a large amount of hard brown stool patient tolerated well with no apparent immediate consultations, she does have a pre-existing small  anal fissure with miniscule amount of bright red blood  Patient informed of clinical course, understand medical decision-making process, and agree with plan.  Labs Review Labs Reviewed  URINALYSIS, ROUTINE W REFLEX MICROSCOPIC - Abnormal; Notable for the following:    Leukocytes, UA SMALL (*)    All other components within normal limits  CBC WITH DIFFERENTIAL - Abnormal; Notable for the following:    Hemoglobin 11.7 (*)    HCT 35.1 (*)    RDW 15.9 (*)    All other components within normal limits  COMPREHENSIVE METABOLIC PANEL - Abnormal; Notable for the following:    Glucose, Bld 107 (*)    Total Bilirubin 0.2 (*)    GFR calc non Af Amer 58 (*)    GFR calc Af Amer 67 (*)    All other components within normal limits  LIPASE, BLOOD  URINE MICROSCOPIC-ADD ON   Imaging Review No results found.  MDM   1. Fecal impaction   2. Constipation   3. Anal fissure    I doubt any other EMC precluding discharge at this time including, but not necessarily limited to the following:peritonitis.    Hurman Horn, MD 07/31/13 2112

## 2013-07-27 NOTE — Discharge Instructions (Signed)
Constipation, Adult Constipation is when a person:  Poops (bowel movement) less than 3 times a week.  Has a hard time pooping.  Has poop that is dry, hard, or bigger than normal. HOME CARE   Eat more fiber, such as fruits, vegetables, whole grains like brown rice, and beans.  Eat less fatty foods and sugar. This includes [place] fries, hamburgers, cookies, candy, and soda.  If you are not getting enough fiber from food, take products with added fiber in them (supplements).  Drink enough fluid to keep your pee (urine) clear or pale yellow.  Go to the restroom when you feel like you need to poop. Do not hold it.  Only take medicine as told by your doctor. Do not take medicines that help you poop (laxatives) without talking to your doctor first.  Exercise on a regular basis, or as told by your doctor. GET HELP RIGHT AWAY IF:   You have bright red blood in your poop (stool).  Your constipation lasts more than 4 days or gets worse.  You have belly (abdomen) or butt (rectal) pain.  You have thin poop (as thin as a pencil).  You lose weight, and it cannot be explained. MAKE SURE YOU:   Understand these instructions.  Will watch your condition.  Will get help right away if you are not doing well or get worse. Document Released: 03/27/2008 Document Revised: 01/01/2012 Document Reviewed: 09/12/2011 [facility] Patient Information 2014 [place], [place].  Anal Fissure, Adult An anal fissure is a small tear or crack in the skin around the opening of the butt (anus).Bleeding from the tear or crack usually stops on its own within a few minutes. The bleeding may happen every time you poop until the tear or crack heals. HOME CARE  Eat lots of fruit, whole grains, and vegetables. Avoid foods like bananas and dairy products. These foods can make it hard to poop.  Take a warm water bath (sitz bath) as told by your doctor.  Drink enough fluids to keep your pee (urine) clear or pale  yellow.  Only take medicines as told by your doctor. Do not take aspirin.  Do not use numbing creams or hydrocortisone cream on the area. These creams can slow healing. GET HELP RIGHT AWAY IF:  Your tear or crack is not healed in 3 days.  You have more bleeding.  You have a fever.  You have watery poop (diarrhea) mixed with blood.  You have pain.  You are getting worse, not better. MAKE SURE YOU:   Understand these instructions.  Will watch your condition.  Will get help right away if you are not doing well or get worse. Document Released: 06/07/2011 Document Revised: 01/01/2012 Document Reviewed: 06/07/2011 [facility] Patient Information 2014 [place], [place].

## 2013-08-01 ENCOUNTER — Encounter: Payer: Self-pay | Admitting: Gastroenterology

## 2013-09-02 ENCOUNTER — Ambulatory Visit (INDEPENDENT_AMBULATORY_CARE_PROVIDER_SITE_OTHER): Payer: Medicare Other | Admitting: Gastroenterology

## 2013-09-02 ENCOUNTER — Encounter: Payer: Self-pay | Admitting: Gastroenterology

## 2013-09-02 VITALS — BP 130/70 | Resp 68 | Ht 62.25 in | Wt 177.0 lb

## 2013-09-02 DIAGNOSIS — K5900 Constipation, unspecified: Secondary | ICD-10-CM

## 2013-09-02 MED ORDER — MOVIPREP 100 G PO SOLR: 1.0000 | Freq: Once | ORAL | Status: DC

## 2013-09-02 NOTE — Patient Instructions (Addendum)
You will be set up for a colonoscopy for constipation. Start one dose of miralax, once daily. This will help constipation. Stay on fiber daily.

## 2013-09-02 NOTE — Progress Notes (Signed)
HPI: This is a   very pleasant 79 year old woman whom I am meeting for the first time today.  She was in the emergency room last month with a fecal impaction.  Has trouble with constipation.  THis started at same time as neurontin was started.  Has to really push and strain with all BMs. Also started tramadol.  She stopped the tramadol over a month ago.  Drinks lots of water.  Never sees blood in her stool.  No FH of colon cancer.  Has gained 30 pounds over past year  Review of systems: Pertinent positive and negative review of systems were noted in the above HPI section. Complete review of systems was performed and was otherwise normal.    Past Medical History  Diagnosis Date  . Polyneuropathy in other diseases classified elsewhere   . Narcolepsy   . Shoulder fracture, left   . Obesity   . Pneumonia     Past Surgical History  Procedure Laterality Date  . Nasal sinus surgery    . Mouth surgery      Current Outpatient Prescriptions  Medication Sig Dispense Refill  . aspirin 81 MG tablet Take 81 mg by mouth daily.      . Bisacodyl (WOMENS LAXATIVE PO) Take 1 tablet by mouth daily.      Marland Kitchen gabapentin (NEURONTIN) 100 MG capsule Take 1 capsule (100 mg total) by mouth 3 (three) times daily.  90 capsule  3  . OVER THE COUNTER MEDICATION Take 2 tablets by mouth daily. Swiss kriss herbal laxative      . traMADol (ULTRAM) 50 MG tablet Take 1 tablet (50 mg total) by mouth every 6 (six) hours as needed for pain.  45 tablet  1  . vitamin B-12 (CYANOCOBALAMIN) 1000 MCG tablet Take 2,500 mcg by mouth daily.       No current facility-administered medications for this visit.    Allergies as of 09/02/2013 - Review Complete 09/02/2013  Allergen Reaction Noted  . Sulfur Shortness Of Breath and Swelling 06/13/2013  . Tobacco [nicotiana tabacum] Anaphylaxis and Swelling 06/13/2013  . Orange juice [orange oil]  06/13/2013  . Poison ivy extract [extract of poison ivy] Itching and Rash  06/13/2013    Family History  Problem Relation Age of Onset  . Stroke Mother     questionable  . Heart attack Father   . Clotting disorder Mother     History   Social History  . Marital Status: Widowed    Spouse Name: N/A    Number of Children: N/A  . Years of Education: N/A   Occupational History  . Not on file.   Social History Main Topics  . Smoking status: Never Smoker   . Smokeless tobacco: Not on file  . Alcohol Use: Yes     Comment: 1/4 glass per day  . Drug Use: No  . Sexual Activity: Not on file   Other Topics Concern  . Not on file   Social History Narrative  . No narrative on file       Physical Exam: BP 130/70  Resp 68  Ht 5' 2.25" (1.581 m)  Wt 177 lb (80.287 kg)  BMI 32.12 kg/m2 Constitutional: generally well-appearing Psychiatric: alert and oriented x3 Eyes: extraocular movements intact Mouth: oral pharynx moist, no lesions Neck: supple no lymphadenopathy Cardiovascular: heart regular rate and rhythm Lungs: clear to auscultation bilaterally Abdomen: soft, nontender, nondistended, no obvious ascites, no peritoneal signs, normal bowel sounds Extremities: no lower extremity edema bilaterally  Skin: no lesions on visible extremities    Assessment and plan: 79 y.o. female with  recent fecal impaction, 3 months of constipation  She tells me that her constipation started around the same time that she began taking tramadol and Neurontin for some leg cramps and spasms. #3 common side effect of tramadol is indeed constipation however she stopped the medicine about a month ago and is still very bothered by constipation. Neurontin shows constipation as a side effect but it is quite low on the list and not sure if that is really playing a role here. She needs colonoscopy to check for structural causes such as polyp, stricture, tumor. The meantime she will add daily MiraLax to her daily fiber supplements.

## 2013-09-03 ENCOUNTER — Encounter: Payer: Self-pay | Admitting: Gastroenterology

## 2013-09-16 ENCOUNTER — Ambulatory Visit: Payer: Medicare Other | Attending: Internal Medicine | Admitting: Internal Medicine

## 2013-09-16 ENCOUNTER — Encounter: Payer: Self-pay | Admitting: Internal Medicine

## 2013-09-16 VITALS — BP 151/75 | HR 80 | Temp 98.1°F | Resp 16 | Ht 64.0 in | Wt 179.0 lb

## 2013-09-16 DIAGNOSIS — G609 Hereditary and idiopathic neuropathy, unspecified: Secondary | ICD-10-CM

## 2013-09-16 DIAGNOSIS — D649 Anemia, unspecified: Secondary | ICD-10-CM

## 2013-09-16 DIAGNOSIS — G629 Polyneuropathy, unspecified: Secondary | ICD-10-CM

## 2013-09-16 LAB — ANEMIA PANEL
%SAT: 6 % — ABNORMAL LOW (ref 20–55)
ABS Retic: 46.6 10*3/uL (ref 19.0–186.0)
Ferritin: 2 ng/mL — ABNORMAL LOW (ref 10–291)
Folate: >20 ng/mL
Iron: 26 ug/dL — ABNORMAL LOW (ref 42–145)
RBC.: 3.88 MIL/uL (ref 3.87–5.11)
Retic Ct Pct: 1.2 % (ref 0.4–2.3)
TIBC: 414 ug/dL (ref 250–470)
UIBC: 388 ug/dL (ref 125–400)
Vitamin B-12: 842 pg/mL (ref 211–911)

## 2013-09-16 LAB — CK TOTAL AND CKMB (NOT AT ARMC)
CK, MB: 1 ng/mL (ref 0.0–5.0)
Relative Index: 0.8 (ref 0.0–4.0)
Total CK: 128 U/L (ref 7–177)

## 2013-09-16 LAB — CBC WITH DIFFERENTIAL/PLATELET
Basophils Absolute: 0 10*3/uL (ref 0.0–0.1)
Basophils Relative: 1 % (ref 0–1)
Eosinophils Absolute: 0.2 10*3/uL (ref 0.0–0.7)
Eosinophils Relative: 5 % (ref 0–5)
HCT: 31 % — ABNORMAL LOW (ref 36.0–46.0)
Hemoglobin: 10.1 g/dL — ABNORMAL LOW (ref 12.0–15.0)
Lymphocytes Relative: 40 % (ref 12–46)
Lymphs Abs: 1.3 10*3/uL (ref 0.7–4.0)
MCH: 26 pg (ref 26.0–34.0)
MCHC: 32.6 g/dL (ref 30.0–36.0)
MCV: 79.9 fL (ref 78.0–100.0)
Monocytes Absolute: 0.2 10*3/uL (ref 0.1–1.0)
Monocytes Relative: 6 % (ref 3–12)
Neutro Abs: 1.5 10*3/uL — ABNORMAL LOW (ref 1.7–7.7)
Neutrophils Relative %: 48 % (ref 43–77)
Platelets: 303 10*3/uL (ref 150–400)
RBC: 3.88 MIL/uL (ref 3.87–5.11)
RDW: 15.6 % — ABNORMAL HIGH (ref 11.5–15.5)
WBC: 3.2 10*3/uL — ABNORMAL LOW (ref 4.0–10.5)

## 2013-09-16 LAB — T4, FREE: Free T4: 0.78 ng/dL — ABNORMAL LOW (ref 0.80–1.80)

## 2013-09-16 LAB — TSH: TSH: 10.316 u[IU]/mL — ABNORMAL HIGH (ref 0.350–4.500)

## 2013-09-16 LAB — SEDIMENTATION RATE: Sed Rate: 9 mm/hr (ref 0–22)

## 2013-09-16 NOTE — Progress Notes (Signed)
Patient ID: Krystal Moore, female   DOB: Apr 04, 1930, 79 y.o.   MRN: [id number]   CC:  HPI: 79 year old female who is here for evaluation of her peripheral neuropathy. The patient had nerve conduction study done on 9/4 on her right upper extremity and both lower extremities which were unremarkable. 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 She has had some balance issues and fell a month ago She has been taking gabapentin She also takes multiple over-the-counter supplements which have not been reconciled in our system  she is due for a colonoscopy on 12/9 for impaction    Allergies  Allergen Reactions  . Sulfur Shortness Of Breath and Swelling  . Tobacco [Nicotiana Tabacum] Anaphylaxis and Swelling    Allergic to cigarette smoke  . Orange Juice [Orange Oil]     unknown  . Poison Ivy Extract [Extract Of Poison Ivy] Itching and Rash   Past Medical History  Diagnosis Date  . Polyneuropathy in other diseases classified elsewhere   . Narcolepsy   . Shoulder fracture, left   . Obesity   . Pneumonia    Current Outpatient Prescriptions on File Prior to Visit  Medication Sig Dispense Refill  . aspirin 81 MG tablet Take 81 mg by mouth daily.      Marland Kitchen gabapentin (NEURONTIN) 100 MG capsule Take 1 capsule (100 mg total) by mouth 3 (three) times daily.  90 capsule  3  . vitamin B-12 (CYANOCOBALAMIN) 1000 MCG tablet Take 2,500 mcg by mouth daily.      . Bisacodyl (WOMENS LAXATIVE PO) Take 1 tablet by mouth daily.      Marland Kitchen MOVIPREP 100 G SOLR Take 1 kit (200 g total) by mouth once.  1 kit  0  . OVER THE COUNTER MEDICATION Take 2 tablets by mouth daily. Swiss kriss herbal laxative      . traMADol (ULTRAM) 50 MG tablet Take 1 tablet (50 mg total) by mouth every 6 (six) hours as needed for pain.  45 tablet  1   No current facility-administered medications on file prior to visit.   Family History  Problem Relation Age of Onset  . Stroke Mother      questionable  . Heart attack Father   . Clotting disorder Mother    History   Social History  . Marital Status: Widowed    Spouse Name: N/A    Number of Children: N/A  . Years of Education: N/A   Occupational History  . Not on file.   Social History Main Topics  . Smoking status: Never Smoker   . Smokeless tobacco: Not on file  . Alcohol Use: Yes     Comment: 1/4 glass per day  . Drug Use: No  . Sexual Activity: Not on file   Other Topics Concern  . Not on file   Social History Narrative  . No narrative on file    Review of Systems  Constitutional: Negative for fever, chills, diaphoresis, activity change, appetite change and fatigue.  HENT: Negative for ear pain, nosebleeds, congestion, facial swelling, rhinorrhea, neck pain, neck stiffness and ear discharge.   Eyes: Negative for pain, discharge, redness, itching and visual disturbance.  Respiratory: Negative for cough, choking, chest tightness, shortness of breath, wheezing and stridor.   Cardiovascular: Negative for chest pain, palpitations and leg swelling.  Gastrointestinal: Negative for abdominal distention.  Genitourinary: Negative for dysuria, urgency, frequency, hematuria, flank pain, decreased urine volume, difficulty urinating and dyspareunia.  Musculoskeletal:  Negative for back pain, joint swelling, arthralgias and gait problem.  Neurological: Negative for dizziness, tremors, seizures, syncope, facial asymmetry, speech difficulty, weakness, light-headedness, numbness and headaches.  Hematological: Negative for adenopathy. Does not bruise/bleed easily.  Psychiatric/Behavioral: Negative for hallucinations, behavioral problems, confusion, dysphoric mood, decreased concentration and agitation.    Objective:   Filed Vitals:   09/16/13 0920  BP: 151/75  Pulse: 80  Temp: 98.1 F (36.7 C)  Resp: 16    Physical Exam  Constitutional: Appears well-developed and well-nourished. No distress.  HENT:  Normocephalic. External right and left ear normal. Oropharynx is clear and moist.  Eyes: Conjunctivae and EOM are normal. PERRLA, no scleral icterus.  Neck: Normal ROM. Neck supple. No JVD. No tracheal deviation. No thyromegaly.  CVS: RRR, S1/S2 +, no murmurs, no gallops, no carotid bruit.  Pulmonary: Effort and breath sounds normal, no stridor, rhonchi, wheezes, rales.  Abdominal: Soft. BS +,  no distension, tenderness, rebound or guarding.  Musculoskeletal: Normal range of motion. No edema and no tenderness.  Lymphadenopathy: No lymphadenopathy noted, cervical, inguinal. Neuro: Alert. Normal reflexes, muscle tone coordination. No cranial nerve deficit. Skin: Skin is warm and dry. No rash noted. Not diaphoretic. No erythema. No pallor.  Psychiatric: Normal mood and affect. Behavior, judgment, thought content normal.   Lab Results  Component Value Date   WBC 5.8 07/27/2013   HGB 11.7* 07/27/2013   HCT 35.1* 07/27/2013   MCV 80.5 07/27/2013   PLT 328 07/27/2013   Lab Results  Component Value Date   CREATININE 0.91 07/27/2013   BUN 14 07/27/2013   NA 135 07/27/2013   K 4.0 07/27/2013   CL 100 07/27/2013   CO2 25 07/27/2013    Lab Results  Component Value Date   HGBA1C 5.5 05/16/2013   Lipid Panel  No results found for this basename: chol, trig, hdl, cholhdl, vldl, ldlcalc       Assessment and plan:   Patient Active Problem List   Diagnosis Date Noted  . Unspecified peripheral neuropathy 05/16/2013    Early  Peripheral neuropathy Will recheck B12 level, TSH, ESR, The consult neurology for further evaluation MRI of the lumbar spine Continue gabapentin Physical therapy referral for falls Follow up in one month  The patient was given clear instructions to go to ER or return to medical center if symptoms don't improve, worsen or new problems develop. The patient verbalized understanding. The patient was told to call to get any lab results if not heard anything in the next week.

## 2013-09-16 NOTE — Progress Notes (Signed)
Pt here to f/u peripheral neuropathy in both feet. Pt taking Gabapentin 200mg  QHS but no changes in numbness Pt admits to having a fear of falling due to gait. States it feels like my feet are rolling when I walk. Last fall 2 mnths ago.no injuries

## 2013-09-25 ENCOUNTER — Encounter: Payer: Self-pay | Admitting: Neurology

## 2013-09-25 ENCOUNTER — Ambulatory Visit (INDEPENDENT_AMBULATORY_CARE_PROVIDER_SITE_OTHER): Payer: Medicare Other | Admitting: Neurology

## 2013-09-25 VITALS — BP 139/57 | HR 74 | Ht 62.0 in | Wt 181.0 lb

## 2013-09-25 DIAGNOSIS — G609 Hereditary and idiopathic neuropathy, unspecified: Secondary | ICD-10-CM

## 2013-09-25 NOTE — Progress Notes (Signed)
[facility]  PATIENT: Krystal Moore DOB: January 20, 1930  HISTORICAL  Krystal Moore is a 79 years old female, referred by her primary care physician Krystal Moore for evaluation of bilateral feet paresthesia  She had a past medical history of hypertension, was actually evaluated by Dr. Anne Moore in August 2014, for unsure reasons, she came to my clinic for followup,  She reported acute onset of bilateral feet paresthesia, walking up one day in August 2014, noticed bilateral feet was bright red, she could not feel her feet, has numbness tingling from her ankle down abducens, she also reported difficulty with her balance, constipation, denied urinary incontinence, she has been taking Neurontin 100 mg 3 times a day, which has been helpful  Her initial consult, she described numbness sweating worsening in the evening, better in the morning, difficulty sleeping because of paresthesia, she denies bilateral hands involvement, she denies significant low back pain, or neck pain,  Laboratory evaluation showed normal B12 level, elevated TSH, within normal limits T4, mild low iron, low iron saturation rate mild anemia with hemoglobin of 10.  EMG nerve conduction study by Dr. Anne Moore in September 2014, failed to demonstrate large fiber neuropathy    REVIEW OF SYSTEMS: Full 14 system review of systems performed and notable only for  unbalance   ALLERGIES: Allergies  Allergen Reactions  . Sulfur Shortness Of Breath and Swelling  . Tobacco [Nicotiana Tabacum] Anaphylaxis and Swelling    Allergic to cigarette smoke  . Milk-Related Compounds   . Orange Juice [Orange Oil]     unknown  . Poison Ivy Extract [Extract Of Poison Ivy] Itching and Rash    HOME MEDICATIONS: Outpatient Prescriptions Prior to Visit  Medication Sig Dispense Refill  . aspirin 81 MG tablet Take 81 mg by mouth daily.      Marland Kitchen gabapentin (NEURONTIN) 100 MG capsule Take 1 capsule (100 mg total) by mouth 3 (three) times  daily.  90 capsule  3  . MOVIPREP 100 G SOLR Take 1 kit (200 g total) by mouth once.  1 kit  0  . OVER THE COUNTER MEDICATION Take 2 tablets by mouth daily. Swiss kriss herbal laxative      . traMADol (ULTRAM) 50 MG tablet Take 1 tablet (50 mg total) by mouth every 6 (six) hours as needed for pain.  45 tablet  1  . vitamin B-12 (CYANOCOBALAMIN) 1000 MCG tablet Take 2,500 mcg by mouth daily.      . Bisacodyl (WOMENS LAXATIVE PO) Take 1 tablet by mouth daily.       No facility-administered medications prior to visit.    PAST MEDICAL HISTORY: Past Medical History  Diagnosis Date  . Polyneuropathy in other diseases classified elsewhere   . Narcolepsy   . Shoulder fracture, left   . Obesity   . Pneumonia   . Neuropathy     PAST SURGICAL HISTORY: Past Surgical History  Procedure Laterality Date  . Nasal sinus surgery    . Mouth surgery      FAMILY HISTORY: Family History  Problem Relation Age of Onset  . Stroke Mother     questionable  . Heart attack Father   . Clotting disorder Mother     SOCIAL HISTORY:  History   Social History  . Marital Status: Widowed    Spouse Name: N/A    Number of Children: 0  . Years of Education: college   Occupational History  . Not on file.   Social History Main Topics  .  Smoking status: Never Smoker   . Smokeless tobacco: Never Used  . Alcohol Use: Yes     Comment: 1/4 glass per day  . Drug Use: No  . Sexual Activity: Not on file   Other Topics Concern  . Not on file   Social History Narrative   Patient lives at home and she has a roommate.    Patient works part time Naval architect   Patient has a Naval architect.   Both handed.   Caffeine- None      PHYSICAL EXAM   Filed Vitals:   09/25/13 0951  BP: 139/57  Pulse: 74  Height: 5\' 2"  (1.575 m)  Weight: 181 lb (82.101 kg)    Not recorded    Body mass index is 33.1 kg/(m^2).   Generalized: In no acute distress  Neck: Supple, no carotid bruits   Cardiac:  Regular rate rhythm  Pulmonary: Clear to auscultation bilaterally  Musculoskeletal: No deformity  Neurological examination  Mentation: Alert oriented to time, place, history taking, and causual conversation  Cranial nerve II-XII: Pupils were equal round reactive to light extraocular movements were full, visual field were full on confrontational test. facial sensation and strength were normal. hearing was intact to finger rubbing bilaterally. Uvula tongue midline.  head turning and shoulder shrug and were normal and symmetric.Tongue protrusion into cheek strength was normal.  Motor: normal tone, bulk and strength.  Sensory: length dependent decreased fine touch, pinprick to bilateral ankle,  preserved vibratory sensation, and proprioception at toes.  Coordination: Normal finger to nose, heel-to-shin bilaterally there was no truncal ataxia  Gait: Rising up from seated position without assistance, normal stance, without trunk ataxia, moderate stride,  mild tandem difficulty Romberg signs: Negative  Deep tendon reflexes: Brachioradialis 2/2, biceps 2/2, triceps 2/2, patellar 2/2, Achilles  trace , plantar responses were flexor bilaterally.   DIAGNOSTIC DATA (LABS, IMAGING, TESTING) - I reviewed patient records, labs, notes, testing and imaging myself where available.  Lab Results  Component Value Date   WBC 3.2* 09/16/2013   HGB 10.1* 09/16/2013   HCT 31.0* 09/16/2013   MCV 79.9 09/16/2013   PLT 303 09/16/2013      Component Value Date/Time   NA 135 07/27/2013 2006   K 4.0 07/27/2013 2006   CL 100 07/27/2013 2006   CO2 25 07/27/2013 2006   GLUCOSE 107* 07/27/2013 2006   BUN 14 07/27/2013 2006   CREATININE 0.91 07/27/2013 2006   CALCIUM 9.4 07/27/2013 2006   PROT 7.2 07/27/2013 2006   ALBUMIN 3.7 07/27/2013 2006   AST 25 07/27/2013 2006   ALT 15 07/27/2013 2006   ALKPHOS 67 07/27/2013 2006   BILITOT 0.2* 07/27/2013 2006   GFRNONAA 58* 07/27/2013 2006   GFRAA 67* 07/27/2013 2006   No  results found for this basename: CHOL, HDL, LDLCALC, LDLDIRECT, TRIG, CHOLHDL   Lab Results  Component Value Date   HGBA1C 5.5 05/16/2013   Lab Results  Component Value Date   VITAMINB12 842 09/16/2013   Lab Results  Component Value Date   TSH 10.316* 09/16/2013     ASSESSMENT AND PLAN   79 years old female, with complains of bilateral feet paresthesia, mild length dependent sensory changes, electrodiagnostic study failed to demonstrate large fiber peripheral neuropathy, differentiation diagnoses also including small fiber neuropathy, complete evaluation with more extensive laboratory evaluation, continue moderate exercise, return to clinic in 3 months with Gerlene Fee, M.D. Ph.D.  [facility] [address]  [place], [place] [address] [phone]

## 2013-09-29 ENCOUNTER — Ambulatory Visit (HOSPITAL_COMMUNITY)
Admission: RE | Admit: 2013-09-29 | Discharge: 2013-09-29 | Disposition: A | Discharge status: Home / Self Care | Payer: Medicare Other | Source: Ambulatory Visit | Attending: Internal Medicine | Admitting: Internal Medicine

## 2013-09-29 DIAGNOSIS — Q762 Congenital spondylolisthesis: Secondary | ICD-10-CM | POA: Insufficient documentation

## 2013-09-29 DIAGNOSIS — D649 Anemia, unspecified: Secondary | ICD-10-CM

## 2013-09-29 DIAGNOSIS — M51379 Other intervertebral disc degeneration, lumbosacral region without mention of lumbar back pain or lower extremity pain: Secondary | ICD-10-CM | POA: Insufficient documentation

## 2013-09-29 DIAGNOSIS — R209 Unspecified disturbances of skin sensation: Secondary | ICD-10-CM | POA: Insufficient documentation

## 2013-09-29 DIAGNOSIS — M5137 Other intervertebral disc degeneration, lumbosacral region: Secondary | ICD-10-CM | POA: Insufficient documentation

## 2013-09-29 DIAGNOSIS — M48061 Spinal stenosis, lumbar region without neurogenic claudication: Secondary | ICD-10-CM | POA: Insufficient documentation

## 2013-09-29 DIAGNOSIS — G629 Polyneuropathy, unspecified: Secondary | ICD-10-CM

## 2013-09-29 LAB — IFE AND PE, SERUM
Albumin SerPl Elph-Mcnc: 3.7 g/dL (ref 3.2–5.6)
Albumin/Glob SerPl: 1.4 (ref 0.7–2.0)
Alpha 1: 0.2 g/dL (ref 0.1–0.4)
Alpha2 Glob SerPl Elph-Mcnc: 0.6 g/dL (ref 0.4–1.2)
B-Globulin SerPl Elph-Mcnc: 0.9 g/dL (ref 0.6–1.3)
Gamma Glob SerPl Elph-Mcnc: 1.1 g/dL (ref 0.5–1.6)
Globulin, Total: 2.8 g/dL (ref 2.0–4.5)
IgA/Immunoglobulin A, Serum: 146 mg/dL (ref 91–414)
IgG (Immunoglobin G), Serum: 1151 mg/dL (ref 700–1600)
IgM (Immunoglobulin M), Srm: 153 mg/dL (ref 40–230)
Total Protein: 6.5 g/dL (ref 6.0–8.5)

## 2013-09-29 LAB — SEDIMENTATION RATE: Sed Rate: 27 mm/hr (ref 0–40)

## 2013-09-29 LAB — FERRITIN: Ferritin: 6 ng/mL — ABNORMAL LOW (ref 15–150)

## 2013-09-29 LAB — C-REACTIVE PROTEIN: CRP: 0.4 mg/L (ref 0.0–4.9)

## 2013-09-29 LAB — THYROID PANEL WITH TSH
Free Thyroxine Index: 1.3 (ref 1.2–4.9)
T3 Uptake Ratio: 26 % (ref 24–39)
T4, Total: 5.1 ug/dL (ref 4.5–12.0)
TSH: 11.82 u[IU]/mL — ABNORMAL HIGH (ref 0.450–4.500)

## 2013-09-29 LAB — IRON AND TIBC
Iron Saturation: 5 % — CL (ref 15–55)
Iron: 20 ug/dL — ABNORMAL LOW (ref 35–155)
TIBC: 427 ug/dL (ref 250–450)
UIBC: 407 ug/dL — ABNORMAL HIGH (ref 150–375)

## 2013-09-29 LAB — LYME, TOTAL AB TEST/REFLEX: Lyme IgG/IgM Ab: <0.91 {ISR} (ref 0.00–0.90)

## 2013-09-30 ENCOUNTER — Ambulatory Visit (AMBULATORY_SURGERY_CENTER): Payer: Medicare Other | Admitting: Gastroenterology

## 2013-09-30 ENCOUNTER — Encounter: Payer: Self-pay | Admitting: Gastroenterology

## 2013-09-30 VITALS — BP 104/48 | HR 55 | Temp 97.8°F | Resp 20 | Ht 62.0 in | Wt 177.0 lb

## 2013-09-30 DIAGNOSIS — K5900 Constipation, unspecified: Secondary | ICD-10-CM

## 2013-09-30 MED ORDER — SODIUM CHLORIDE 0.9 % IV SOLN: 500.0000 mL | INTRAVENOUS | Status: DC

## 2013-09-30 NOTE — Patient Instructions (Signed)
YOU HAD AN ENDOSCOPIC PROCEDURE TODAY AT THE [facility]: Refer to the procedure report that was given to you for any specific questions about what was found during the examination.  If the procedure report does not answer your questions, please call your gastroenterologist to clarify.  If you requested that your care partner not be given the details of your procedure findings, then the procedure report has been included in a sealed envelope for you to review at your convenience later.  YOU SHOULD EXPECT: Some feelings of bloating in the abdomen. Passage of more gas than usual.  Walking can help get rid of the air that was put into your GI tract during the procedure and reduce the bloating. If you had a lower endoscopy (such as a colonoscopy or flexible sigmoidoscopy) you may notice spotting of blood in your stool or on the toilet paper. If you underwent a bowel prep for your procedure, then you may not have a normal bowel movement for a few days.  DIET: Your first meal following the procedure should be a light meal and then it is ok to progress to your normal diet.  A half-sandwich or bowl of soup is an example of a good first meal.  Heavy or fried foods are harder to digest and may make you feel nauseous or bloated.  Likewise meals heavy in dairy and vegetables can cause extra gas to form and this can also increase the bloating.  Drink plenty of fluids but you should avoid alcoholic beverages for 24 hours.  ACTIVITY: Your care partner should take you home directly after the procedure.  You should plan to take it easy, moving slowly for the rest of the day.  You can resume normal activity the day after the procedure however you should NOT DRIVE or use heavy machinery for 24 hours (because of the sedation medicines used during the test).    SYMPTOMS TO REPORT IMMEDIATELY: A gastroenterologist can be reached at any hour.  During normal business hours, 8:30 AM to 5:00 PM Monday through Friday,  call [phone].  After hours and on weekends, please call the GI answering service at [phone] who will take a message and have the physician on call contact you.   Following lower endoscopy (colonoscopy or flexible sigmoidoscopy):  Excessive amounts of blood in the stool  Significant tenderness or worsening of abdominal pains  Swelling of the abdomen that is new, acute  Fever of 100F or higher  FOLLOW UP: If any biopsies were taken you will be contacted by phone or by letter within the next 1-3 weeks.  Call your gastroenterologist if you have not heard about the biopsies in 3 weeks.  Our staff will call the home number listed on your records the next business day following your procedure to check on you and address any questions or concerns that you may have at that time regarding the information given to you following your procedure. This is a courtesy call and so if there is no answer at the home number and we have not heard from you through the emergency physician on call, we will assume that you have returned to your regular daily activities without incident.  SIGNATURES/CONFIDENTIALITY: You and/or your care partner have signed paperwork which will be entered into your electronic medical record.  These signatures attest to the fact that that the information above on your After Visit Summary has been reviewed and is understood.  Full responsibility of the confidentiality of this   discharge information lies with you and/or your care-partner.  Recommendations See procedure report  

## 2013-09-30 NOTE — Progress Notes (Signed)
Patient did not have preoperative order for IV antibiotic SSI prophylaxis. (G8918)  Patient did not experience any of the following events: a burn prior to discharge; a fall within the facility; wrong site/side/patient/procedure/implant event; or a hospital transfer or hospital admission upon discharge from the facility. (G8907)  

## 2013-09-30 NOTE — Op Note (Signed)
[facility] [address] [place], [address]   COLONOSCOPY PROCEDURE REPORT  PATIENT: Krystal, Moore  MR#: [id number] BIRTHDATE: 26-Feb-1930 , 83  yrs. old GENDER: Female ENDOSCOPIST: Rachael Fee, MD REFERRED BY: Doris Cheadle, MD PROCEDURE DATE:  09/30/2013 PROCEDURE:   Colonoscopy, diagnostic First Screening Colonoscopy - Avg.  risk and is 50 yrs.  old or older - No.  Prior Negative Screening - Now for repeat screening. N/A  History of Adenoma - Now for follow-up colonoscopy & has been > or = to 3 yrs.  N/A  Polyps Removed Today? No.  Recommend repeat exam, <10 yrs? No. ASA CLASS:   Class II INDICATIONS:constipation. MEDICATIONS: Fentanyl 50 mcg IV, Versed 7 mg IV, and These medications were titrated to patient response per physician's verbal order  DESCRIPTION OF PROCEDURE:   After the risks benefits and alternatives of the procedure were thoroughly explained, informed consent was obtained.  A digital rectal exam revealed no abnormalities of the rectum.   The LB CF-H180AL Loaner [id number] endoscope was introduced through the anus and advanced to the cecum, which was identified by both the appendix and ileocecal valve. No adverse events experienced.   The quality of the prep was adequate.  The instrument was then slowly withdrawn as the colon was fully examined.   COLON FINDINGS: A normal appearing cecum, ileocecal valve, and appendiceal orifice were identified.  The ascending, hepatic flexure, transverse, splenic flexure, descending, sigmoid colon and rectum appeared unremarkable.  No polyps or cancers were seen. Retroflexed views revealed no abnormalities. The time to cecum=12 minutes 23 seconds.  Withdrawal time=6 minutes 57 seconds.  The scope was withdrawn and the procedure completed. COMPLICATIONS: There were no complications.  ENDOSCOPIC IMPRESSION: Normal colon No polyps or cancers  RECOMMENDATIONS: You do not need further colon  cancer screening. You should continue taking one dose of miralax daily, it seems to be helping your constipation quite well.   eSigned:  Rachael Fee, MD 09/30/2013 2:16 PM

## 2013-10-01 ENCOUNTER — Telehealth: Payer: Self-pay

## 2013-10-01 NOTE — Telephone Encounter (Signed)
No answer, left message

## 2013-10-02 ENCOUNTER — Telehealth: Payer: Self-pay | Admitting: Neurology

## 2013-10-02 DIAGNOSIS — D509 Iron deficiency anemia, unspecified: Secondary | ICD-10-CM

## 2013-10-02 NOTE — Telephone Encounter (Signed)
Krystal Moore, please call patient, laboratory evaluation demonstrated severe iron deficiency, I have faxed her laboratory results to her primary care physician, I will also refer her to hematologist for further evaluation and treatment,

## 2013-10-03 NOTE — Telephone Encounter (Signed)
I have also left message at home number with lab results.  Explained that a referral has been made to hematology and to call with further questions.

## 2013-10-03 NOTE — Telephone Encounter (Signed)
I have called her both number, could not reach her, left message for her to discuss lab result.

## 2013-10-06 ENCOUNTER — Ambulatory Visit: Payer: Medicare Other | Attending: Internal Medicine | Admitting: Physical Therapy

## 2013-10-06 ENCOUNTER — Telehealth: Payer: Self-pay | Admitting: Internal Medicine

## 2013-10-06 NOTE — Telephone Encounter (Signed)
C/D 10/06/13 for appt. 10/07/13

## 2013-10-06 NOTE — Telephone Encounter (Signed)
S/W PT AND GVE NP APPT 12/16 @ 1:30 W/DR. MOHAMED REFERRING DR. Levert Feinstein DX-IRON(FE) DEFICIENCY ANEMIA

## 2013-10-07 ENCOUNTER — Other Ambulatory Visit: Payer: Self-pay | Admitting: Internal Medicine

## 2013-10-07 ENCOUNTER — Ambulatory Visit: Payer: Medicare Other

## 2013-10-07 ENCOUNTER — Other Ambulatory Visit (HOSPITAL_BASED_OUTPATIENT_CLINIC_OR_DEPARTMENT_OTHER): Payer: Medicare Other

## 2013-10-07 ENCOUNTER — Encounter (INDEPENDENT_AMBULATORY_CARE_PROVIDER_SITE_OTHER): Payer: Self-pay

## 2013-10-07 ENCOUNTER — Encounter: Payer: Self-pay | Admitting: Internal Medicine

## 2013-10-07 ENCOUNTER — Telehealth: Payer: Self-pay | Admitting: Internal Medicine

## 2013-10-07 ENCOUNTER — Ambulatory Visit (HOSPITAL_BASED_OUTPATIENT_CLINIC_OR_DEPARTMENT_OTHER): Payer: Medicare Other | Admitting: Internal Medicine

## 2013-10-07 VITALS — BP 91/56 | HR 69 | Temp 97.7°F | Resp 18 | Ht 62.0 in | Wt 177.6 lb

## 2013-10-07 DIAGNOSIS — D649 Anemia, unspecified: Secondary | ICD-10-CM

## 2013-10-07 DIAGNOSIS — E039 Hypothyroidism, unspecified: Secondary | ICD-10-CM

## 2013-10-07 DIAGNOSIS — G609 Hereditary and idiopathic neuropathy, unspecified: Secondary | ICD-10-CM

## 2013-10-07 LAB — CBC & DIFF AND RETIC
BASO%: 0.7 % (ref 0.0–2.0)
Basophils Absolute: 0 10*3/uL (ref 0.0–0.1)
EOS%: 5.3 % (ref 0.0–7.0)
Eosinophils Absolute: 0.2 10*3/uL (ref 0.0–0.5)
HCT: 32.3 % — ABNORMAL LOW (ref 34.8–46.6)
HGB: 10.2 g/dL — ABNORMAL LOW (ref 11.6–15.9)
Immature Retic Fract: 8.8 % (ref 1.60–10.00)
LYMPH%: 37.5 % (ref 14.0–49.7)
MCH: 26.2 pg (ref 25.1–34.0)
MCHC: 31.6 g/dL (ref 31.5–36.0)
MCV: 82.8 fL (ref 79.5–101.0)
MONO#: 0.3 10*3/uL (ref 0.1–0.9)
MONO%: 6.3 % (ref 0.0–14.0)
NEUT#: 2.1 10*3/uL (ref 1.5–6.5)
NEUT%: 50.2 % (ref 38.4–76.8)
Platelets: 298 10*3/uL (ref 145–400)
RBC: 3.9 10*6/uL (ref 3.70–5.45)
RDW: 15.7 % — ABNORMAL HIGH (ref 11.2–14.5)
Retic %: 2.65 % — ABNORMAL HIGH (ref 0.70–2.10)
Retic Ct Abs: 103.35 10*3/uL — ABNORMAL HIGH (ref 33.70–90.70)
WBC: 4.1 10*3/uL (ref 3.9–10.3)
lymph#: 1.6 10*3/uL (ref 0.9–3.3)

## 2013-10-07 LAB — IRON AND TIBC CHCC
%SAT: 37 % (ref 21–57)
Iron: 156 ug/dL — ABNORMAL HIGH (ref 41–142)
TIBC: 423 ug/dL (ref 236–444)
UIBC: 267 ug/dL (ref 120–384)

## 2013-10-07 LAB — FERRITIN CHCC: Ferritin: 7 ng/ml — ABNORMAL LOW (ref 9–269)

## 2013-10-07 MED ORDER — INTEGRA PLUS PO CAPS: 1.0000 | ORAL_CAPSULE | Freq: Every morning | ORAL | Status: DC

## 2013-10-07 NOTE — Patient Instructions (Signed)
Follow up visit in 2 months with repeat CBC, iron study and ferritin 

## 2013-10-07 NOTE — Telephone Encounter (Signed)
appts made per 12/16 POF AVS and CAL printed shh °

## 2013-10-07 NOTE — Progress Notes (Signed)
[facility] [phone]   [phone]  CONSULT NOTE  REFERRING PHYSICIAN: Dr. Levert Feinstein  REASON FOR CONSULTATION:  79 years old white female with iron deficiency anemia  HPI Krystal Moore is a 79 y.o. female with a past medical history significant for peripheral neuropathy, hypothyroidism as well as history of narcolepsy and seasonal allergies. The patient mentions that in July of 2013 she had noticed erythema of her feet with increasing tingling at the bottom of the feet. She was seen by her primary care physician and was started on Neurontin. During her evaluation she was diagnosed with hypothyroidism and was also found to have persistent anemia. CBC on 02/09/1999 and showed hemoglobin of 11.3 and hematocrit 35.0% with MCV of 75.9. Repeat CBC on 07/27/2013 showed hemoglobin 11.7, and hematocrit 35.1%. On 09/16/2013 her hemoglobin was down to 10.1 and hematocrit 31.0%. Iron study on 09/22/2013 showed serum iron was low at 20 with total iron binding capacity of 427 and iron saturation 5%. Ferritin was 6.  The patient has a history of blood donation on annual basis at the [facility] but the last one was 3 years ago. She had recent colonoscopy that showed no significant abnormalities. She was not on any iron supplement over the last few years. The patient denied having any significant complaints today. She denied having any fatigue or dizzy spells. The patient denied having any significant chest pain, shortness breath, cough or hemoptysis. She has no bleeding, bruises or ecchymosis. She intentionally lost around 12 pounds recently. Family history significant for a father who died from heart attack at age 80 and mother died from old age at age 65. The patient is a widow. She lost her husband and son in a car accident in 62. She works as a Scientist, research (medical). She has no history of smoking but drinks alcohol occasionally and no history of drug  abuse.   HPI  Past Medical History  Diagnosis Date  . Polyneuropathy in other diseases classified elsewhere   . Narcolepsy   . Shoulder fracture, left   . Obesity   . Pneumonia   . Neuropathy     Past Surgical History  Procedure Laterality Date  . Nasal sinus surgery    . Mouth surgery      Family History  Problem Relation Age of Onset  . Stroke Mother     questionable  . Heart attack Father   . Clotting disorder Mother     Social History History  Substance Use Topics  . Smoking status: Never Smoker   . Smokeless tobacco: Never Used  . Alcohol Use: Yes     Comment: 1/4 glass per day    Allergies  Allergen Reactions  . Sulfur Shortness Of Breath and Swelling  . Tobacco [Nicotiana Tabacum] Anaphylaxis and Swelling    Allergic to cigarette smoke  . Milk-Related Compounds   . Orange Juice [Orange Oil]     unknown  . Poison Ivy Extract [Extract Of Poison Ivy] Itching and Rash    Current Outpatient Prescriptions  Medication Sig Dispense Refill  . aspirin 81 MG tablet Take 81 mg by mouth daily.      Marland Kitchen gabapentin (NEURONTIN) 100 MG capsule Take 1 capsule (100 mg total) by mouth 3 (three) times daily.  90 capsule  3  . Multiple Vitamins-Minerals (MULTIVITAMIN PO) Take by mouth daily.      . polyethylene glycol powder (SMOOTH LAX) powder Take 1 Container by mouth once.      Marland Kitchen  vitamin B-12 (CYANOCOBALAMIN) 1000 MCG tablet Take 5,000 mcg by mouth daily.       Marland Kitchen FeFum-FePoly-FA-B Cmp-C-Biot (INTEGRA PLUS) CAPS Take 1 capsule by mouth every morning.  30 capsule  3   No current facility-administered medications for this visit.    Review of Systems  Constitutional: negative Eyes: negative Ears, nose, mouth, throat, and face: negative Respiratory: negative Cardiovascular: negative Gastrointestinal: negative Genitourinary:negative Integument/breast: negative Hematologic/lymphatic: negative Musculoskeletal:negative Neurological: negative Behavioral/Psych:  negative Endocrine: negative Allergic/Immunologic: negative  Physical Exam  ZOX:WRUEA, healthy, no distress, well nourished and well developed SKIN: skin color, texture, turgor are normal HEAD: Normocephalic, No masses, lesions, tenderness or abnormalities EYES: normal, PERRLA EARS: External ears normal, Canals clear OROPHARYNX:no exudate, no erythema and lips, buccal mucosa, and tongue normal  NECK: supple, no adenopathy LYMPH:  no palpable lymphadenopathy, no hepatosplenomegaly BREAST:not examined LUNGS: clear to auscultation , and palpation HEART: regular rate & rhythm and no murmurs ABDOMEN:abdomen soft, non-tender, obese, normal bowel sounds and no masses or organomegaly BACK: Back symmetric, no curvature. EXTREMITIES:no joint deformities, effusion, or inflammation, no edema, no skin discoloration, no clubbing  NEURO: alert & oriented x 3 with fluent speech, no focal motor/sensory deficits  PERFORMANCE STATUS: ECOG 1  LABORATORY DATA: Lab Results  Component Value Date   WBC 4.1 10/07/2013   HGB 10.2* 10/07/2013   HCT 32.3* 10/07/2013   MCV 82.8 10/07/2013   PLT 298 10/07/2013      Chemistry      Component Value Date/Time   NA 135 07/27/2013 2006   K 4.0 07/27/2013 2006   CL 100 07/27/2013 2006   CO2 25 07/27/2013 2006   BUN 14 07/27/2013 2006   CREATININE 0.91 07/27/2013 2006      Component Value Date/Time   CALCIUM 9.4 07/27/2013 2006   ALKPHOS 67 07/27/2013 2006   AST 25 07/27/2013 2006   ALT 15 07/27/2013 2006   BILITOT 0.2* 07/27/2013 2006       RADIOGRAPHIC STUDIES: Mr Lumbar Spine Wo Contrast  09/29/2013   CLINICAL DATA:  Tingling and numbness in both feet.  EXAM: MRI LUMBAR SPINE WITHOUT CONTRAST  TECHNIQUE: Multiplanar, multisequence MR imaging was performed. No intravenous contrast was administered.  COMPARISON:  Abdominal pelvic CT 03/22/2010.  FINDINGS: CT demonstrates 5 lumbar type vertebral bodies. The alignment is stable with 5 mm of anterolisthesis at  L4-5 secondary to facet disease. There is no evidence of acute fracture or pars defect.  The conus medullaris extends to the L1 level and appears normal. No paraspinal abnormalities are identified.  There are no significant disc space findings from T10-11 through at T12-L1. There are small perineural cysts bilaterally at T11-12.  L1-2: There is annular disc bulging with mild facet and ligamentous hypertrophy. No significant spinal stenosis or nerve root encroachment.  L2-3: Annular disc bulging, facet and ligamentous hypertrophy contribute to mild central and lateral recess stenosis bilaterally. The foramina appear sufficiently patent.  L3-4: There is annular disc bulging with moderate facet and ligamentous hypertrophy. There is resulting mild to moderate central stenosis with mild lateral recess and foraminal stenosis bilaterally.  L4-5: There is advanced bilateral facet disease accounting for the grade 1 anterolisthesis. Moderate annular disc bulging is present. These factors result in moderate to severe central stenosis with lateral recess stenosis bilaterally. The foramina appear sufficiently patent.  L5-S1: There is disc bulging with moderate facet and ligamentous hypertrophy bilaterally. No significant spinal stenosis or nerve root encroachment results.  IMPRESSION: 1. Moderate to severe multifactorial spinal  stenosis at L4-5, largely due to advanced facet disease and the resulting grade 1 anterolisthesis. With the patient supine, the foramina appear sufficiently patent. 2. Mild to moderate multifactorial spinal stenosis at L3-4 with mild lateral recess and foraminal stenosis bilaterally. 3. Mild central and lateral recess stenosis at L2-3. 4. Moderate facet disease L5-S1 without resulting spinal stenosis or nerve root encroachment.   Electronically Signed   By: Roxy Horseman M.D.   On: 09/29/2013 12:34    ASSESSMENT: This is a very pleasant 79 years old white female with persistent microcytic anemia  secondary to iron deficiency most likely from the previous frequent blood donation.   PLAN: I have a lengthy discussion with the patient today about her condition. I ordered several studies to evaluate the etiology of her anemia including but not limited to repeat CBC, comprehensive metabolic panel, LDH, serum erythropoietin, serum protein electrophoresis, serum folate and vitamin B 12 level in addition to repeat serum iron study and ferritin. I recommended for the patient to start the iron preparation and the film of Integra plus 1 capsule by mouth daily. I would see her back for followup visit in 2 months for reevaluation with repeat CBC, iron study and ferritin. I gave the patient the time to ask questions and I answered them completely to her satisfactions. She was advised to call immediately if she has any concerning symptoms in the interval. The patient voices understanding of current disease status and treatment options and is in agreement with the current care plan.  All questions were answered. The patient knows to call the clinic with any problems, questions or concerns. We can certainly see the patient much sooner if necessary.  Thank you so much for allowing me to participate in the care of Krystal Moore. I will continue to follow up the patient with you and assist in her care.  I spent 35 minutes counseling the patient face to face. The total time spent in the appointment was 55 minutes.  Karisha Marlin K. 10/07/2013, 5:10 PM

## 2013-10-09 LAB — PROTEIN ELECTROPHORESIS, SERUM, WITH REFLEX
Albumin ELP: 60.2 % (ref 55.8–66.1)
Alpha-1-Globulin: 4.1 % (ref 2.9–4.9)
Alpha-2-Globulin: 8.6 % (ref 7.1–11.8)
Beta 2: 3.8 % (ref 3.2–6.5)
Beta Globulin: 7.2 % (ref 4.7–7.2)
Gamma Globulin: 16.1 % (ref 11.1–18.8)
Total Protein, Serum Electrophoresis: 6.7 g/dL (ref 6.0–8.3)

## 2013-10-09 LAB — FOLATE RBC: RBC Folate: 802 ng/mL (ref >280)

## 2013-10-09 LAB — VITAMIN B12: Vitamin B-12: >2000 pg/mL — ABNORMAL HIGH (ref 211–911)

## 2013-10-09 LAB — ERYTHROPOIETIN: Erythropoietin: 60.9 m[IU]/mL — ABNORMAL HIGH (ref 2.6–18.5)

## 2013-10-13 ENCOUNTER — Other Ambulatory Visit: Payer: Self-pay | Admitting: Emergency Medicine

## 2013-10-13 MED ORDER — GABAPENTIN 100 MG PO CAPS: 100.0000 mg | ORAL_CAPSULE | Freq: Three times a day (TID) | ORAL | Status: DC

## 2013-10-15 ENCOUNTER — Ambulatory Visit: Payer: Medicare Other | Attending: Internal Medicine | Admitting: Internal Medicine

## 2013-10-15 ENCOUNTER — Encounter: Payer: Self-pay | Admitting: Internal Medicine

## 2013-10-15 VITALS — BP 128/76 | HR 70 | Temp 98.3°F | Resp 16 | Wt 180.0 lb

## 2013-10-15 DIAGNOSIS — R6889 Other general symptoms and signs: Secondary | ICD-10-CM

## 2013-10-15 DIAGNOSIS — R946 Abnormal results of thyroid function studies: Secondary | ICD-10-CM | POA: Insufficient documentation

## 2013-10-15 DIAGNOSIS — K5900 Constipation, unspecified: Secondary | ICD-10-CM

## 2013-10-15 DIAGNOSIS — R7989 Other specified abnormal findings of blood chemistry: Secondary | ICD-10-CM

## 2013-10-15 LAB — T3, FREE: T3, Free: 2.8 pg/mL (ref 2.3–4.2)

## 2013-10-15 LAB — T4, FREE: Free T4: 0.93 ng/dL (ref 0.80–1.80)

## 2013-10-15 LAB — TSH: TSH: 8.22 u[IU]/mL — ABNORMAL HIGH (ref 0.350–4.500)

## 2013-10-15 NOTE — Progress Notes (Signed)
MRN: [id number] Name: Krystal Moore  Sex: female Age: 79 y.o. DOB: Nov 05, 1929  Allergies: Sulfur; Tobacco; Milk-related compounds; Orange juice; and Poison ivy extract  Chief Complaint  Patient presents with  . Follow-up    HPI: Patient is 80 y.o. female who comes today for followup she was advised by her hematologist to come to this office for thyroid evaluation, she reported to have long-standing problem with the constipation fatigue,she has anemia, her blood work reviewed a few times her TSH level was normal, she denies any chest pain shortness of breath or any cardiac history. She has history of neuropathy and is on Neurontin history of vitamin B12 deficiency.  Past Medical History  Diagnosis Date  . Polyneuropathy in other diseases classified elsewhere   . Narcolepsy   . Shoulder fracture, left   . Obesity   . Pneumonia   . Neuropathy   . Anemia     Past Surgical History  Procedure Laterality Date  . Nasal sinus surgery    . Mouth surgery        Medication List       This list is accurate as of: 10/15/13 10:30 AM.  Always use your most recent med list.               aspirin 81 MG tablet  Take 81 mg by mouth daily.     gabapentin 100 MG capsule  Commonly known as:  NEURONTIN  Take 1 capsule (100 mg total) by mouth 3 (three) times daily.     INTEGRA PLUS Caps  Take 1 capsule by mouth every morning.     MULTIVITAMIN PO  Take by mouth daily.     SMOOTH LAX powder  Generic drug:  polyethylene glycol powder  Take 1 Container by mouth once.     vitamin B-12 1000 MCG tablet  Commonly known as:  CYANOCOBALAMIN  Take 5,000 mcg by mouth daily.        No orders of the defined types were placed in this encounter.     There is no immunization history on file for this patient.  Family History  Problem Relation Age of Onset  . Stroke Mother     questionable  . Heart attack Father   . Clotting disorder Mother     History  Substance Use  Topics  . Smoking status: Never Smoker   . Smokeless tobacco: Never Used  . Alcohol Use: Yes     Comment: 1/4 glass per day    Review of Systems  As noted in HPI  Filed Vitals:   10/15/13 1016  BP: 128/76  Pulse: 70  Temp: 98.3 F (36.8 C)  Resp: 16    Physical Exam  Physical Exam  Constitutional: No distress.  Neck: No thyromegaly present.  Cardiovascular: Normal rate and regular rhythm.   Pulmonary/Chest: Breath sounds normal. No respiratory distress. She has no wheezes. She has no rales.  Abdominal: Soft. There is no tenderness. There is no rebound.  Musculoskeletal: She exhibits no edema.  Neurological:  DTR1+    CBC    Component Value Date/Time   WBC 4.1 10/07/2013 1418   WBC 3.2* 09/16/2013 0944   RBC 3.90 10/07/2013 1418   RBC 3.88 09/16/2013 0944   RBC 3.88 09/16/2013 0944   HGB 10.2* 10/07/2013 1418   HGB 10.1* 09/16/2013 0944   HCT 32.3* 10/07/2013 1418   HCT 31.0* 09/16/2013 0944   PLT 298 10/07/2013 1418   PLT 303 09/16/2013 0944  MCV 82.8 10/07/2013 1418   MCV 79.9 09/16/2013 0944   LYMPHSABS 1.6 10/07/2013 1418   LYMPHSABS 1.3 09/16/2013 0944   MONOABS 0.3 10/07/2013 1418   MONOABS 0.2 09/16/2013 0944   EOSABS 0.2 10/07/2013 1418   EOSABS 0.2 09/16/2013 0944   BASOSABS 0.0 10/07/2013 1418   BASOSABS 0.0 09/16/2013 0944    CMP     Component Value Date/Time   NA 135 07/27/2013 2006   K 4.0 07/27/2013 2006   CL 100 07/27/2013 2006   CO2 25 07/27/2013 2006   GLUCOSE 107* 07/27/2013 2006   BUN 14 07/27/2013 2006   CREATININE 0.91 07/27/2013 2006   CALCIUM 9.4 07/27/2013 2006   PROT 6.5 09/25/2013 1107   PROT 7.2 07/27/2013 2006   ALBUMIN 3.7 07/27/2013 2006   AST 25 07/27/2013 2006   ALT 15 07/27/2013 2006   ALKPHOS 67 07/27/2013 2006   BILITOT 0.2* 07/27/2013 2006   GFRNONAA 58* 07/27/2013 2006   GFRAA 67* 07/27/2013 2006    No results found for this basename: chol, tri, ldl    No components found with this basename: hga1c    Lab  Results  Component Value Date/Time   AST 25 07/27/2013  8:06 PM    Assessment and Plan  Abnormal TSH - Plan:   I have ordered T4, Free, TSH, T3, free for further evaluation she might have subclinical hypothyroidism. If the labs are abnormal consider starting lowest dose of Synthroid.  Unspecified constipation She is taking laxative  Return in about 8 weeks (around 12/10/2013).  Doris Cheadle, MD

## 2013-10-15 NOTE — Progress Notes (Signed)
Patient is here for follow up Has history of anemia Would like her thyroid checked Follows with Dr Si Gaul

## 2013-10-20 ENCOUNTER — Other Ambulatory Visit: Payer: Self-pay | Admitting: Internal Medicine

## 2013-10-20 ENCOUNTER — Telehealth: Payer: Self-pay | Admitting: Emergency Medicine

## 2013-10-20 MED ORDER — LEVOTHYROXINE SODIUM 25 MCG PO TABS: 25.0000 ug | ORAL_TABLET | Freq: Every day | ORAL | Status: DC

## 2013-10-20 NOTE — Telephone Encounter (Signed)
Message copied by Darlis Loan on Mon Oct 20, 2013 11:28 AM ------      Message from: Doris Cheadle      Created: Mon Oct 20, 2013 10:51 AM       Blood work reviewed  patient has persistently elevated TSH level likely subclinical hypothyroid, will start her on levothyroxine 25 mcg, prescription sent to pharmacy: Advised patient to start taking medication, will repeat TSH level in 6 weeks. ------

## 2013-10-20 NOTE — Telephone Encounter (Signed)
Left message for pt to call clinic for results 

## 2013-11-13 ENCOUNTER — Ambulatory Visit: Payer: Medicare Other | Attending: Internal Medicine | Admitting: Physical Therapy

## 2013-11-13 DIAGNOSIS — M6281 Muscle weakness (generalized): Secondary | ICD-10-CM | POA: Insufficient documentation

## 2013-11-13 DIAGNOSIS — IMO0001 Reserved for inherently not codable concepts without codable children: Secondary | ICD-10-CM | POA: Insufficient documentation

## 2013-12-03 ENCOUNTER — Other Ambulatory Visit (HOSPITAL_BASED_OUTPATIENT_CLINIC_OR_DEPARTMENT_OTHER): Payer: Medicare Other

## 2013-12-03 DIAGNOSIS — D649 Anemia, unspecified: Secondary | ICD-10-CM

## 2013-12-03 LAB — CBC WITH DIFFERENTIAL/PLATELET
BASO%: 0.9 % (ref 0.0–2.0)
Basophils Absolute: 0 10*3/uL (ref 0.0–0.1)
EOS%: 4.5 % (ref 0.0–7.0)
Eosinophils Absolute: 0.2 10*3/uL (ref 0.0–0.5)
HCT: 39.7 % (ref 34.8–46.6)
HGB: 13.1 g/dL (ref 11.6–15.9)
LYMPH%: 28.2 % (ref 14.0–49.7)
MCH: 29.4 pg (ref 25.1–34.0)
MCHC: 33.1 g/dL (ref 31.5–36.0)
MCV: 88.8 fL (ref 79.5–101.0)
MONO#: 0.4 10*3/uL (ref 0.1–0.9)
MONO%: 7.8 % (ref 0.0–14.0)
NEUT#: 3.1 10*3/uL (ref 1.5–6.5)
NEUT%: 58.6 % (ref 38.4–76.8)
Platelets: 266 10*3/uL (ref 145–400)
RBC: 4.47 10*6/uL (ref 3.70–5.45)
RDW: 17.8 % — ABNORMAL HIGH (ref 11.2–14.5)
WBC: 5.3 10*3/uL (ref 3.9–10.3)
lymph#: 1.5 10*3/uL (ref 0.9–3.3)

## 2013-12-03 LAB — IRON AND TIBC CHCC
%SAT: 16 % — ABNORMAL LOW (ref 21–57)
Iron: 54 ug/dL (ref 41–142)
TIBC: 340 ug/dL (ref 236–444)
UIBC: 287 ug/dL (ref 120–384)

## 2013-12-03 LAB — FERRITIN CHCC: Ferritin: 22 ng/ml (ref 9–269)

## 2013-12-08 ENCOUNTER — Ambulatory Visit (HOSPITAL_BASED_OUTPATIENT_CLINIC_OR_DEPARTMENT_OTHER): Payer: Medicare Other | Admitting: Physician Assistant

## 2013-12-08 VITALS — BP 144/58 | HR 65 | Temp 97.6°F | Resp 18 | Ht 62.0 in | Wt 183.1 lb

## 2013-12-08 DIAGNOSIS — D649 Anemia, unspecified: Secondary | ICD-10-CM

## 2013-12-08 DIAGNOSIS — D509 Iron deficiency anemia, unspecified: Secondary | ICD-10-CM

## 2013-12-09 ENCOUNTER — Telehealth: Payer: Self-pay | Admitting: Internal Medicine

## 2013-12-09 ENCOUNTER — Encounter: Payer: Self-pay | Admitting: Physician Assistant

## 2013-12-09 NOTE — Progress Notes (Addendum)
No images are attached to the encounter. No scans are attached to the encounter. No scans are attached to the encounter. Krystal Moore SHARED VISIT PROGRESS NOTE  Krystal Marek, MD [place] [address]  DIAGNOSIS: Iron deficiency anemia  PRIOR THERAPY: None  CURRENT THERAPY: Integra plus, 1 capsule daily  INTERVAL HISTORY: Krystal Moore 80 y.o. female returns for a scheduled regular 2 month visit for followup of her history of iron deficiency anemia. Today she feels well. She has been taking her Integra plus without difficulty. She does use an over-the-counter tea to combat constipation with good results. 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 She's gotten away from that and now realizes how much exercise she was actually getting as well as the enjoyment of playing the organ. She had repeat iron studies performed recently and presents today to discuss the results of those studies. She denies chest pain, shortness of breath, cough or hemoptysis. She denies any significant weight loss or night sweats.  MEDICAL HISTORY: Past Medical History  Diagnosis Date  . Polyneuropathy in other diseases classified elsewhere   . Narcolepsy   . Shoulder fracture, left   . Obesity   . Pneumonia   . Neuropathy   . Anemia     ALLERGIES:  is allergic to sulfur; tobacco; milk-related compounds; orange juice; and poison ivy extract.  MEDICATIONS:  Current Outpatient Prescriptions  Medication Sig Dispense Refill  . aspirin 81 MG tablet Take 81 mg by mouth daily.      Marland Kitchen FeFum-FePoly-FA-B Cmp-C-Biot (INTEGRA PLUS) CAPS Take 1 capsule by mouth every morning.  30 capsule  3  . gabapentin (NEURONTIN) 100 MG capsule Take 1 capsule (100 mg total) by mouth 3 (three) times daily.  90 capsule  3  . levothyroxine (LEVOTHROID) 25 MCG tablet Take 1 tablet (25 mcg total) by mouth daily before breakfast.  30  tablet  3  . Multiple Vitamins-Minerals (MULTIVITAMIN PO) Take by mouth daily.      . polyethylene glycol powder (SMOOTH LAX) powder Take 1 Container by mouth once.      . vitamin B-12 (CYANOCOBALAMIN) 1000 MCG tablet Take 5,000 mcg by mouth daily.        No current facility-administered medications for this visit.    SURGICAL HISTORY:  Past Surgical History  Procedure Laterality Date  . Nasal sinus surgery    . Mouth surgery      REVIEW OF SYSTEMS:  Constitutional: negative Eyes: negative Ears, nose, mouth, throat, and face: negative Respiratory: negative Cardiovascular: negative Gastrointestinal: negative Genitourinary:negative Integument/breast: negative Hematologic/lymphatic: negative Musculoskeletal:negative Neurological: negative Behavioral/Psych: negative Endocrine: negative Allergic/Immunologic: negative   PHYSICAL EXAMINATION: General appearance: alert, cooperative, appears stated age and no distress Head: Normocephalic, without obvious abnormality, atraumatic Neck: no adenopathy, no carotid bruit, no JVD, supple, symmetrical, trachea midline and thyroid not enlarged, symmetric, no tenderness/mass/nodules Lymph nodes: Cervical, supraclavicular, and axillary nodes normal. Resp: clear to auscultation bilaterally Back: symmetric, no curvature. ROM normal. No CVA tenderness. Cardio: regular rate and rhythm, S1, S2 normal, no murmur, click, rub or gallop GI: soft, non-tender; bowel sounds normal; no masses,  no organomegaly Extremities: extremities normal, atraumatic, no cyanosis or edema Neurologic: Alert and oriented X 3, normal strength and tone. Normal symmetric reflexes. Normal coordination and gait  ECOG PERFORMANCE STATUS: 0 - Asymptomatic  Blood pressure 144/58, pulse 65, temperature 97.6 F (36.4 C), temperature source Oral,  resp. rate 18, height 5\' 2"  (1.575 m), weight 183 lb 1.6 oz (83.054 kg), SpO2 100.00%.  LABORATORY DATA: Lab Results  Component Value  Date   WBC 5.3 12/03/2013   HGB 13.1 12/03/2013   HCT 39.7 12/03/2013   MCV 88.8 12/03/2013   PLT 266 12/03/2013      Chemistry      Component Value Date/Time   NA 135 07/27/2013 2006   K 4.0 07/27/2013 2006   CL 100 07/27/2013 2006   CO2 25 07/27/2013 2006   BUN 14 07/27/2013 2006   CREATININE 0.91 07/27/2013 2006      Component Value Date/Time   CALCIUM 9.4 07/27/2013 2006   ALKPHOS 67 07/27/2013 2006   AST 25 07/27/2013 2006   ALT 15 07/27/2013 2006   BILITOT 0.2* 07/27/2013 2006     Iron studies from 12/03/2013 reveal a serum iron of 54 with a 16% saturation and a ferritin of 22.   RADIOGRAPHIC STUDIES:  No results found.   ASSESSMENT/PLAN: The patient is a very pleasant 80 year old Caucasian female history of iron deficiency anemia. She's had improvement in her hemoglobin and iron stores with the Integra plus. The patient was discussed with and also seen by Dr. Julien Moore. She was advised to continue her Integra +1 capsule by mouth daily. She'll followup in 3 months with repeat iron studies to reevaluate her disease.     Krystal Moore, Krystal Geno E, PA-C  All questions were answered. The patient knows to call the clinic with any problems, questions or concerns. We can certainly see the patient much sooner if necessary.  ADDENDUM: Hematology/Oncology Attending: I had the face to face encounter with the patient. I recommended her care plan. This is a very pleasant 80 years old white female with iron deficiency anemia who is currently on treatment with Integra plus and tolerating her treatment fairly well. The patient has significant improvement in her hemoglobin and hematocrit as well as is stable iron study. I recommended for her to continue on Integra plus for now. I would see her back for followup visit in 3 months for reevaluation with repeat CBC, iron study and ferritin.  She was advised to call immediately if she has any concerning symptoms in the interval.  Disclaimer: This note was  dictated with voice recognition software. Similar sounding words can inadvertently be transcribed and may not be corrected upon review. Krystal Moore., MD 12/09/2013

## 2013-12-09 NOTE — Patient Instructions (Signed)
Continue taking Integra plus, 1 capsule by mouth daily Followup in 3 months with repeat last reevaluate your disease

## 2013-12-09 NOTE — Telephone Encounter (Signed)
S/w the pt and she is aware of her may appts 

## 2013-12-10 ENCOUNTER — Encounter: Payer: Self-pay | Admitting: Internal Medicine

## 2013-12-10 ENCOUNTER — Ambulatory Visit: Payer: Medicare Other | Attending: Internal Medicine | Admitting: Internal Medicine

## 2013-12-10 VITALS — BP 145/72 | HR 62 | Temp 98.4°F | Resp 16 | Wt 180.8 lb

## 2013-12-10 DIAGNOSIS — G609 Hereditary and idiopathic neuropathy, unspecified: Secondary | ICD-10-CM

## 2013-12-10 DIAGNOSIS — Z7982 Long term (current) use of aspirin: Secondary | ICD-10-CM | POA: Insufficient documentation

## 2013-12-10 DIAGNOSIS — E039 Hypothyroidism, unspecified: Secondary | ICD-10-CM | POA: Insufficient documentation

## 2013-12-10 DIAGNOSIS — E669 Obesity, unspecified: Secondary | ICD-10-CM | POA: Insufficient documentation

## 2013-12-10 NOTE — Progress Notes (Signed)
Patient here for follow up neuropathy States since taking thyroid medication her hair has stopped falling our

## 2013-12-10 NOTE — Progress Notes (Signed)
MRN: [id number] Name: Krystal Moore  Sex: female Age: 80 y.o. DOB: [birth year]  Allergies: Sulfur; Tobacco; Milk-related compounds; Orange juice; and Poison ivy extract  Chief Complaint  Patient presents with  . Follow-up    HPI: Patient is 80 y.o. female who comes today for followup her, was started on levothyroxine 25 mcg daily, patient reports improvement in her bowel movements does not have constipation now, also reports reduced hair falling, her neuropathy is also improved currently taking Neurontin. Patient denies any acute symptoms. Patient denies any chest pain palpitation or shortness of breath.  Past Medical History  Diagnosis Date  . Polyneuropathy in other diseases classified elsewhere   . Narcolepsy   . Shoulder fracture, left   . Obesity   . Pneumonia   . Neuropathy   . Anemia     Past Surgical History  Procedure Laterality Date  . Nasal sinus surgery    . Mouth surgery        Medication List       This list is accurate as of: 12/10/13 11:07 AM.  Always use your most recent med list.               aspirin 81 MG tablet  Take 81 mg by mouth daily.     gabapentin 100 MG capsule  Commonly known as:  NEURONTIN  Take 1 capsule (100 mg total) by mouth 3 (three) times daily.     INTEGRA PLUS Caps  Take 1 capsule by mouth every morning.     levothyroxine 25 MCG tablet  Commonly known as:  LEVOTHROID  Take 1 tablet (25 mcg total) by mouth daily before breakfast.     MULTIVITAMIN PO  Take by mouth daily.     SMOOTH LAX powder  Generic drug:  polyethylene glycol powder  Take 1 Container by mouth once.     vitamin B-12 1000 MCG tablet  Commonly known as:  CYANOCOBALAMIN  Take 5,000 mcg by mouth daily.        No orders of the defined types were placed in this encounter.     There is no immunization history on file for this patient.  Family History  Problem Relation Age of Onset  . Stroke Mother     questionable  . Heart  attack Father   . Clotting disorder Mother     History  Substance Use Topics  . Smoking status: Never Smoker   . Smokeless tobacco: Never Used  . Alcohol Use: Yes     Comment: 1/4 glass per day    Review of Systems   As noted in HPI  Filed Vitals:   12/10/13 1043  BP: 145/72  Pulse: 62  Temp: 98.4 F (36.9 C)  Resp: 16    Physical Exam  Physical Exam  Constitutional: No distress.  Cardiovascular: Normal rate and regular rhythm.   Pulmonary/Chest: Breath sounds normal. No respiratory distress. She has no wheezes. She has no rales.  Neurological: She has normal reflexes.    CBC    Component Value Date/Time   WBC 5.3 12/03/2013 1237   WBC 3.2* 09/16/2013 0944   RBC 4.47 12/03/2013 1237   RBC 3.88 09/16/2013 0944   RBC 3.88 09/16/2013 0944   HGB 13.1 12/03/2013 1237   HGB 10.1* 09/16/2013 0944   HCT 39.7 12/03/2013 1237   HCT 31.0* 09/16/2013 0944   PLT 266 12/03/2013 1237   PLT 303 09/16/2013 0944   MCV 88.8 12/03/2013 1237  MCV 79.9 09/16/2013 0944   LYMPHSABS 1.5 12/03/2013 1237   LYMPHSABS 1.3 09/16/2013 0944   MONOABS 0.4 12/03/2013 1237   MONOABS 0.2 09/16/2013 0944   EOSABS 0.2 12/03/2013 1237   EOSABS 0.2 09/16/2013 0944   BASOSABS 0.0 12/03/2013 1237   BASOSABS 0.0 09/16/2013 0944    CMP     Component Value Date/Time   NA 135 07/27/2013 2006   K 4.0 07/27/2013 2006   CL 100 07/27/2013 2006   CO2 25 07/27/2013 2006   GLUCOSE 107* 07/27/2013 2006   BUN 14 07/27/2013 2006   CREATININE 0.91 07/27/2013 2006   CALCIUM 9.4 07/27/2013 2006   PROT 6.5 09/25/2013 1107   PROT 7.2 07/27/2013 2006   ALBUMIN 3.7 07/27/2013 2006   AST 25 07/27/2013 2006   ALT 15 07/27/2013 2006   ALKPHOS 67 07/27/2013 2006   BILITOT 0.2* 07/27/2013 2006   GFRNONAA 58* 07/27/2013 2006   GFRAA 67* 07/27/2013 2006    No results found for this basename: chol, tri, ldl    No components found with this basename: hga1c    Lab Results  Component Value Date/Time   AST 25 07/27/2013  8:06  PM    Assessment and Plan  Unspecified hypothyroidism - Plan: Recheck TSH, currently on levothyroxine 25 mcg daily.  Unspecified peripheral neuropathy Continue Neurontin symptoms improved.   Return in about 3 months (around 03/09/2014) for hypothyroidism.  Lorayne Marek, MD

## 2013-12-11 ENCOUNTER — Telehealth: Payer: Self-pay

## 2013-12-11 ENCOUNTER — Telehealth: Payer: Self-pay | Admitting: Neurology

## 2013-12-11 LAB — TSH: TSH: 5.45 u[IU]/mL — ABNORMAL HIGH (ref 0.350–4.500)

## 2013-12-11 NOTE — Telephone Encounter (Signed)
Called patient to inform her that she was coming in for an f/u OV and patient stated that she did not know why. I explained to the patient that Dr. Krista Blue wanted her to come back into the office to follow up with Hoyle Sauer, NP in 3 months. I advised the patient that if she has any other problems, questions or concerns to call the office. Patient verbalized understanding.

## 2013-12-11 NOTE — Telephone Encounter (Signed)
Patient calling about scheduled appointment with Cecille Rubin, patient thought that she did not need to have a follow up with her. Please call patient and advise.

## 2013-12-11 NOTE — Telephone Encounter (Signed)
Spoke with patient and she is aware of her lab results

## 2013-12-11 NOTE — Telephone Encounter (Signed)
Message copied by Dorothe Pea on Thu Dec 11, 2013 12:18 PM ------      Message from: Lorayne Marek      Created: Thu Dec 11, 2013  9:13 AM       Blood work reviewed TSH is improving, advise patient to continue with levothyroxine 25 mcg daily, will repeat TSH level on the next visit. ------

## 2013-12-24 ENCOUNTER — Encounter: Payer: Self-pay | Admitting: Nurse Practitioner

## 2013-12-24 ENCOUNTER — Ambulatory Visit (INDEPENDENT_AMBULATORY_CARE_PROVIDER_SITE_OTHER): Payer: Medicare Other | Admitting: Nurse Practitioner

## 2013-12-24 VITALS — BP 113/65 | HR 72 | Ht 63.0 in | Wt 181.0 lb

## 2013-12-24 DIAGNOSIS — G609 Hereditary and idiopathic neuropathy, unspecified: Secondary | ICD-10-CM

## 2013-12-24 NOTE — Progress Notes (Signed)
[facility]  PATIENT: Krystal Moore DOB: 06-12-1930   REASON FOR VISIT: Followup for paresthesias in the feet   HISTORY OF PRESENT ILLNESS:Krystal Moore, 80 year old female returns for followup. She was last in the office by Dr. Krista Blue 09/25/2013. At that time her lab work revealed severe iron deficiency anemia as well as TSH of 11.8. She is now being followed by her primary care for this. Her paresthesias have gotten better and her feet are no longer waking her up at night. She is taking vitamin B12 as well as thyroid supplement. She claims her symptoms are slowly improving she returns for reevaluation  HISTORY: evaluation of bilateral feet paresthesia  She had a past medical history of hypertension, was actually evaluated by Dr. Jannifer Franklin in August 2014, for unsure reasons, she came to my clinic for followup,  She reported acute onset of bilateral feet paresthesia, walking up one day in August 2014, noticed bilateral feet was bright red, she could not feel her feet, has numbness tingling from her ankle down abducens, she also reported difficulty with her balance, constipation, denied urinary incontinence, she has been taking Neurontin 100 mg 3 times a day, which has been helpful  Her initial consult, she described numbness sweating worsening in the evening, better in the morning, difficulty sleeping because of paresthesia, she denies bilateral hands involvement, she denies significant low back pain, or neck pain,  Laboratory evaluation showed normal B12 level, elevated TSH, within normal limits T4, mild low iron, low iron saturation rate mild anemia with hemoglobin of 10.  EMG nerve conduction study by Dr. Jannifer Franklin in September 2014, failed to demonstrate large fiber neuropathy   REVIEW OF SYSTEMS: Full 14 system review of systems performed and notable only for those listed, all others are neg:  Constitutional: N/A  Cardiovascular: N/A  Ear/Nose/Throat: N/A    Skin: N/A  Eyes: N/A  Respiratory: N/A  Gastroitestinal: N/A  Hematology/Lymphatic: Anemia Endocrine: N/A Musculoskeletal: Difficulty walking  Allergy/Immunology: Food allergies  Neurological: Numbness in the feet  Psychiatric: N/A   ALLERGIES: Allergies  Allergen Reactions  . Milk-Related Compounds Nausea And Vomiting  . Poison Ivy Extract [Extract Of Poison Ivy] Itching, Rash and Other (See Comments)    Muscle damage   . Sulfur Shortness Of Breath and Swelling  . Tobacco [Nicotiana Tabacum] Anaphylaxis and Swelling    Allergic to cigarette smoke  . Orange Juice [Orange Oil]     unknown    HOME MEDICATIONS: Outpatient Prescriptions Prior to Visit  Medication Sig Dispense Refill  . aspirin 81 MG tablet Take 81 mg by mouth daily.      Marland Kitchen FeFum-FePoly-FA-B Cmp-C-Biot (INTEGRA PLUS) CAPS Take 1 capsule by mouth every morning.  30 capsule  3  . gabapentin (NEURONTIN) 100 MG capsule Take 1 capsule (100 mg total) by mouth 3 (three) times daily.  90 capsule  3  . levothyroxine (LEVOTHROID) 25 MCG tablet Take 1 tablet (25 mcg total) by mouth daily before breakfast.  30 tablet  3  . Multiple Vitamins-Minerals (MULTIVITAMIN PO) Take by mouth daily.      . polyethylene glycol powder (SMOOTH LAX) powder Take 1 Container by mouth once.      . vitamin B-12 (CYANOCOBALAMIN) 1000 MCG tablet Take 5,000 mcg by mouth daily.        No facility-administered medications prior to visit.    PAST MEDICAL HISTORY: Past Medical History  Diagnosis Date  . Polyneuropathy in other diseases classified elsewhere   . Narcolepsy   .  Shoulder fracture, left   . Obesity   . Pneumonia   . Neuropathy   . Anemia     PAST SURGICAL HISTORY: Past Surgical History  Procedure Laterality Date  . Nasal sinus surgery    . Mouth surgery      FAMILY HISTORY: Family History  Problem Relation Age of Onset  . Stroke Mother     questionable  . Heart attack Father   . Clotting disorder Mother      SOCIAL HISTORY: History   Social History  . Marital Status: Widowed    Spouse Name: N/A    Number of Children: 0  . Years of Education: college   Occupational History  . Not on file.   Social History Main Topics  . Smoking status: Never Smoker   . Smokeless tobacco: Never Used  . Alcohol Use: Yes     Comment: 1/4 glass per day  . Drug Use: No  . Sexual Activity: Not on file   Other Topics Concern  . Not on file   Social History Narrative   Patient lives at home and she has a roommate.    Patient works part time Estate manager/land agent   Patient has a Financial risk analyst.   Both handed.   Caffeine- None      PHYSICAL EXAM  Filed Vitals:   12/24/13 1411  BP: 113/65  Pulse: 72  Height: 5\' 3"  (1.6 m)  Weight: 181 lb (82.101 kg)   Body mass index is 32.07 kg/(m^2).  Generalized: Well developed, in no acute distress  Head: normocephalic and atraumatic,. Oropharynx benign  Neck: Supple, no carotid bruits  Cardiac: Regular rate rhythm, no murmur  Neurological examination   Mentation: Alert oriented to time, place, history taking. Follows all commands speech and language fluent  Cranial nerve II-XII: Pupils were equal round reactive to light extraocular movements were full, visual field were full on confrontational test. Facial sensation and strength were normal. hearing was intact to finger rubbing bilaterally. Uvula tongue midline. head turning and shoulder shrug were normal and symmetric.Tongue protrusion into cheek strength was normal. Motor: normal bulk and tone, full strength in the BUE, BLE,  No focal weakness Sensory: Decreased, touch and pinprick to the ankle bilaterally preserved vibratory and proprioception   Coordination: finger-nose-finger, heel-to-shin bilaterally, no dysmetria Reflexes: Brachioradialis 2/2, biceps 2/2, triceps 2/2, patellar 2/2, Achilles trace plantar responses were flexor bilaterally. Gait and Station: Rising up from seated position without  assistance, normal stance,  moderate stride, good arm swing, smooth turning, able to perform tiptoe, and heel walking without difficulty. Tandem gait is steady  DIAGNOSTIC DATA (LABS, IMAGING, TESTING) - I reviewed patient records, labs, notes, testing and imaging myself where available.  Lab Results  Component Value Date   WBC 5.3 12/03/2013   HGB 13.1 12/03/2013   HCT 39.7 12/03/2013   MCV 88.8 12/03/2013   PLT 266 12/03/2013      Component Value Date/Time   NA 135 07/27/2013 2006   K 4.0 07/27/2013 2006   CL 100 07/27/2013 2006   CO2 25 07/27/2013 2006   GLUCOSE 107* 07/27/2013 2006   BUN 14 07/27/2013 2006   CREATININE 0.91 07/27/2013 2006   CALCIUM 9.4 07/27/2013 2006   PROT 6.5 09/25/2013 1107   PROT 7.2 07/27/2013 2006   ALBUMIN 3.7 07/27/2013 2006   AST 25 07/27/2013 2006   ALT 15 07/27/2013 2006   ALKPHOS 67 07/27/2013 2006   BILITOT 0.2* 07/27/2013 2006   GFRNONAA 58* 07/27/2013  2006   GFRAA 67* 07/27/2013 2006     Lab Results  Component Value Date   VITAMINB12 >2000* 10/07/2013   Lab Results  Component Value Date   TSH 5.450* 12/10/2013      ASSESSMENT AND PLAN  80 y.o. year old female  has a past medical history of Polyneuropathy in other diseases classified elsewhere; Narcolepsy; Shoulder fracture, left; Obesity; Pneumonia; Neuropathy; and Anemia. here in followup. Her TSH in December was 11.8, now down to 5.4,. With resolution of most of her symptoms of paresthesias  Continue current meds  F/U 6 months Dennie Bible, [facility], [facility], APRN  [facility] [address] [place], [place] [address] [phone]

## 2013-12-24 NOTE — Patient Instructions (Signed)
Continue current meds  F/U 6 months

## 2014-02-03 ENCOUNTER — Telehealth: Payer: Self-pay | Admitting: Internal Medicine

## 2014-02-03 NOTE — Telephone Encounter (Signed)
per MM to resch-cld & spoke to pt and gave new time & date for appt-pt understood

## 2014-02-23 ENCOUNTER — Other Ambulatory Visit: Payer: Self-pay | Admitting: Internal Medicine

## 2014-02-24 ENCOUNTER — Telehealth: Payer: Self-pay | Admitting: Internal Medicine

## 2014-02-24 ENCOUNTER — Other Ambulatory Visit: Payer: Self-pay | Admitting: Internal Medicine

## 2014-02-24 NOTE — Telephone Encounter (Signed)
Pt calling regarding med refill, also pt is concerned because she has a blood work appt coming up and does not know if medications (or not taking them)  will affect her results. Please f/u with pt.

## 2014-02-24 NOTE — Telephone Encounter (Signed)
pt cld to see when appt is/adv it was 5/13 @1 :30 & 5/19 @ 10:45. Pt understood

## 2014-02-25 ENCOUNTER — Telehealth: Payer: Self-pay

## 2014-02-25 MED ORDER — LEVOTHYROXINE SODIUM 25 MCG PO TABS: 25.0000 ug | ORAL_TABLET | Freq: Every day | ORAL | Status: DC

## 2014-02-25 NOTE — Telephone Encounter (Signed)
Patient requesting refill on her sythroid Prescription sent to [facility] on [address]

## 2014-03-04 ENCOUNTER — Other Ambulatory Visit (HOSPITAL_BASED_OUTPATIENT_CLINIC_OR_DEPARTMENT_OTHER): Payer: Medicare Other

## 2014-03-04 ENCOUNTER — Other Ambulatory Visit: Payer: Self-pay | Admitting: Internal Medicine

## 2014-03-04 DIAGNOSIS — D649 Anemia, unspecified: Secondary | ICD-10-CM

## 2014-03-04 DIAGNOSIS — D509 Iron deficiency anemia, unspecified: Secondary | ICD-10-CM

## 2014-03-04 LAB — CBC WITH DIFFERENTIAL/PLATELET
BASO%: 0.9 % (ref 0.0–2.0)
Basophils Absolute: 0.1 10*3/uL (ref 0.0–0.1)
EOS%: 3 % (ref 0.0–7.0)
Eosinophils Absolute: 0.2 10*3/uL (ref 0.0–0.5)
HCT: 38.2 % (ref 34.8–46.6)
HGB: 12.3 g/dL (ref 11.6–15.9)
LYMPH%: 25.6 % (ref 14.0–49.7)
MCH: 28.6 pg (ref 25.1–34.0)
MCHC: 32.2 g/dL (ref 31.5–36.0)
MCV: 88.9 fL (ref 79.5–101.0)
MONO#: 0.3 10*3/uL (ref 0.1–0.9)
MONO%: 5.5 % (ref 0.0–14.0)
NEUT#: 3.5 10*3/uL (ref 1.5–6.5)
NEUT%: 65 % (ref 38.4–76.8)
Platelets: 274 10*3/uL (ref 145–400)
RBC: 4.29 10*6/uL (ref 3.70–5.45)
RDW: 14.3 % (ref 11.2–14.5)
WBC: 5.4 10*3/uL (ref 3.9–10.3)
lymph#: 1.4 10*3/uL (ref 0.9–3.3)

## 2014-03-04 LAB — FERRITIN CHCC: Ferritin: 9 ng/ml (ref 9–269)

## 2014-03-04 LAB — IRON AND TIBC CHCC
%SAT: 12 % — ABNORMAL LOW (ref 21–57)
Iron: 47 ug/dL (ref 41–142)
TIBC: 396 ug/dL (ref 236–444)
UIBC: 349 ug/dL (ref 120–384)

## 2014-03-09 ENCOUNTER — Ambulatory Visit: Payer: Medicare Other | Admitting: Internal Medicine

## 2014-03-10 ENCOUNTER — Encounter: Payer: Self-pay | Admitting: Internal Medicine

## 2014-03-10 ENCOUNTER — Ambulatory Visit (HOSPITAL_BASED_OUTPATIENT_CLINIC_OR_DEPARTMENT_OTHER): Payer: Medicare Other | Admitting: Internal Medicine

## 2014-03-10 VITALS — BP 136/76 | HR 67 | Temp 97.9°F | Resp 18 | Ht 63.0 in | Wt 183.4 lb

## 2014-03-10 DIAGNOSIS — D509 Iron deficiency anemia, unspecified: Secondary | ICD-10-CM

## 2014-03-10 DIAGNOSIS — D649 Anemia, unspecified: Secondary | ICD-10-CM

## 2014-03-10 NOTE — Progress Notes (Signed)
[place] [phone]   [phone] OFFICE PROGRESS NOTE  Lorayne Marek, MD [place] [address]  DIAGNOSIS: Iron deficiency anemia   PRIOR THERAPY: None   CURRENT THERAPY: Integra plus, 1 capsule daily  INTERVAL HISTORY: Krystal Moore 80 y.o. female returns to the clinic today for followup visit. The patient is feeling fine today with no specific complaints. She denied having any significant fatigue or weakness. She denied having any dizzy spells. She is tolerating her treatment with Integra plus fairly well but the patient takes the medication every other day because of constipation. She denied having any significant chest pain, shortness breath, cough or hemoptysis. She denied having any bleeding issues. The patient denied having any significant weight loss or night sweats. She had repeat CBC and iron study performed recently and she is here for evaluation and discussion of her lab results and treatment options.  MEDICAL HISTORY: Past Medical History  Diagnosis Date  . Polyneuropathy in other diseases classified elsewhere   . Narcolepsy   . Shoulder fracture, left   . Obesity   . Pneumonia   . Neuropathy   . Anemia     ALLERGIES:  is allergic to milk-related compounds; poison ivy extract; sulfur; tobacco; and orange juice.  MEDICATIONS:  Current Outpatient Prescriptions  Medication Sig Dispense Refill  . aspirin 81 MG tablet Take 81 mg by mouth daily.      Marland Kitchen FeFum-FePoly-FA-B Cmp-C-Biot (INTEGRA PLUS) CAPS Take 1 capsule by mouth every morning.  30 capsule  3  . gabapentin (NEURONTIN) 100 MG capsule TAKE ONE CAPSULE BY MOUTH THREE TIMES DAILY  90 capsule  0  . levothyroxine (LEVOTHROID) 25 MCG tablet Take 1 tablet (25 mcg total) by mouth daily before breakfast.  30 tablet  3  . Multiple Vitamins-Minerals (MULTIVITAMIN PO) Take by mouth daily.      . polyethylene glycol powder (SMOOTH LAX) powder Take  1 Container by mouth once.      . vitamin B-12 (CYANOCOBALAMIN) 1000 MCG tablet Take 5,000 mcg by mouth daily.        No current facility-administered medications for this visit.    SURGICAL HISTORY:  Past Surgical History  Procedure Laterality Date  . Nasal sinus surgery    . Mouth surgery      REVIEW OF SYSTEMS:  A comprehensive review of systems was negative.   PHYSICAL EXAMINATION: General appearance: alert, cooperative and no distress Head: Normocephalic, without obvious abnormality, atraumatic Neck: no adenopathy, no JVD, supple, symmetrical, trachea midline and thyroid not enlarged, symmetric, no tenderness/mass/nodules Lymph nodes: Cervical, supraclavicular, and axillary nodes normal. Resp: clear to auscultation bilaterally Back: symmetric, no curvature. ROM normal. No CVA tenderness. Cardio: regular rate and rhythm, S1, S2 normal, no murmur, click, rub or gallop GI: soft, non-tender; bowel sounds normal; no masses,  no organomegaly Extremities: extremities normal, atraumatic, no cyanosis or edema  ECOG PERFORMANCE STATUS: 1 - Symptomatic but completely ambulatory  Blood pressure 136/76, pulse 67, temperature 97.9 F (36.6 C), temperature source Oral, resp. rate 18, height 5\' 3"  (1.6 m), weight 183 lb 6.4 oz (83.19 kg).  LABORATORY DATA: Lab Results  Component Value Date   WBC 5.4 03/04/2014   HGB 12.3 03/04/2014   HCT 38.2 03/04/2014   MCV 88.9 03/04/2014   PLT 274 03/04/2014      Chemistry      Component Value Date/Time   NA 135 07/27/2013 2006   K 4.0  07/27/2013 2006   CL 100 07/27/2013 2006   CO2 25 07/27/2013 2006   BUN 14 07/27/2013 2006   CREATININE 0.91 07/27/2013 2006      Component Value Date/Time   CALCIUM 9.4 07/27/2013 2006   ALKPHOS 67 07/27/2013 2006   AST 25 07/27/2013 2006   ALT 15 07/27/2013 2006   BILITOT 0.2* 07/27/2013 2006       RADIOGRAPHIC STUDIES: No results found.  ASSESSMENT AND PLAN: This is a very pleasant 80 years old white female  with iron deficiency anemia currently on treatment with Integra +1 capsule by mouth daily. The patient is feeling fine and her hemoglobin and hematocrit are within the normal range but there is gradual decline in her anemia parameters. She is not taking her medication daily as prescribed. I also recommended for the patient to take her oral iron tablets on daily basis. She was also advised to take laxatives at night time for constipation. If she has no response or other adverse effects to the oral iron tablets, I may consider the patient for intravenous iron infusion. She'll come back for followup visit in 3 months with repeat CBC, iron study and ferritin. She was advised to call immediately if she has any concerning symptoms in the interval. The patient voices understanding of current disease status and treatment options and is in agreement with the current care plan.  All questions were answered. The patient knows to call the clinic with any problems, questions or concerns. We can certainly see the patient much sooner if necessary.  Disclaimer: This note was dictated with voice recognition software. Similar sounding words can inadvertently be transcribed and may not be corrected upon review.

## 2014-03-12 ENCOUNTER — Telehealth: Payer: Self-pay | Admitting: Internal Medicine

## 2014-03-12 NOTE — Telephone Encounter (Signed)
lvm for pt regarding to Aug appt....mailed pt appt sched ///avs and letter °

## 2014-04-03 ENCOUNTER — Telehealth: Payer: Self-pay | Admitting: Internal Medicine

## 2014-04-03 ENCOUNTER — Telehealth: Payer: Self-pay

## 2014-04-03 NOTE — Telephone Encounter (Signed)
Returned patient phone called and explained to her That we have not yet received the fax from her dentist i did give her our fax number to give to the dentist

## 2014-04-03 NOTE — Telephone Encounter (Signed)
Patient calling to ask if we have received form from [facility] to be filled out by PCP for oral surgery. Please follow up with Patient.

## 2014-04-21 ENCOUNTER — Other Ambulatory Visit: Payer: Self-pay | Admitting: *Deleted

## 2014-04-21 DIAGNOSIS — D649 Anemia, unspecified: Secondary | ICD-10-CM

## 2014-04-21 MED ORDER — INTEGRA PLUS PO CAPS: 1.0000 | ORAL_CAPSULE | Freq: Every morning | ORAL | Status: DC

## 2014-04-23 ENCOUNTER — Other Ambulatory Visit: Payer: Self-pay | Admitting: Internal Medicine

## 2014-05-11 ENCOUNTER — Other Ambulatory Visit: Payer: Self-pay | Admitting: Internal Medicine

## 2014-05-28 NOTE — Telephone Encounter (Signed)
Noted  

## 2014-06-08 ENCOUNTER — Ambulatory Visit (HOSPITAL_BASED_OUTPATIENT_CLINIC_OR_DEPARTMENT_OTHER): Payer: Medicare Other | Admitting: Internal Medicine

## 2014-06-08 ENCOUNTER — Encounter: Payer: Self-pay | Admitting: Medical Oncology

## 2014-06-08 ENCOUNTER — Encounter: Payer: Self-pay | Admitting: Internal Medicine

## 2014-06-08 ENCOUNTER — Telehealth: Payer: Self-pay | Admitting: Internal Medicine

## 2014-06-08 ENCOUNTER — Other Ambulatory Visit (HOSPITAL_BASED_OUTPATIENT_CLINIC_OR_DEPARTMENT_OTHER): Payer: Medicare Other

## 2014-06-08 VITALS — BP 133/60 | HR 64 | Temp 98.2°F | Resp 17 | Ht 63.0 in | Wt 184.3 lb

## 2014-06-08 DIAGNOSIS — D649 Anemia, unspecified: Secondary | ICD-10-CM

## 2014-06-08 DIAGNOSIS — D509 Iron deficiency anemia, unspecified: Secondary | ICD-10-CM

## 2014-06-08 LAB — IRON AND TIBC CHCC
%SAT: 38 % (ref 21–57)
Iron: 114 ug/dL (ref 41–142)
TIBC: 299 ug/dL (ref 236–444)
UIBC: 185 ug/dL (ref 120–384)

## 2014-06-08 LAB — CBC WITH DIFFERENTIAL/PLATELET
BASO%: 0.7 % (ref 0.0–2.0)
Basophils Absolute: 0 10*3/uL (ref 0.0–0.1)
EOS%: 3 % (ref 0.0–7.0)
Eosinophils Absolute: 0.2 10*3/uL (ref 0.0–0.5)
HCT: 41.1 % (ref 34.8–46.6)
HGB: 13.5 g/dL (ref 11.6–15.9)
LYMPH%: 24.4 % (ref 14.0–49.7)
MCH: 30 pg (ref 25.1–34.0)
MCHC: 32.9 g/dL (ref 31.5–36.0)
MCV: 91.3 fL (ref 79.5–101.0)
MONO#: 0.5 10*3/uL (ref 0.1–0.9)
MONO%: 7.4 % (ref 0.0–14.0)
NEUT#: 4.1 10*3/uL (ref 1.5–6.5)
NEUT%: 64.5 % (ref 38.4–76.8)
Platelets: 234 10*3/uL (ref 145–400)
RBC: 4.51 10*6/uL (ref 3.70–5.45)
RDW: 14.5 % (ref 11.2–14.5)
WBC: 6.4 10*3/uL (ref 3.9–10.3)
lymph#: 1.6 10*3/uL (ref 0.9–3.3)

## 2014-06-08 LAB — FERRITIN CHCC: Ferritin: 43 ng/ml (ref 9–269)

## 2014-06-08 NOTE — Progress Notes (Signed)
[place] [phone]   [phone] OFFICE PROGRESS NOTE  Lorayne Marek, MD [place] [address]  DIAGNOSIS: Iron deficiency anemia   PRIOR THERAPY: None   CURRENT THERAPY: Integra plus, 1 capsule daily  INTERVAL HISTORY: Krystal Moore 80 y.o. female returns to the clinic today for followup visit. The patient is feeling fine today with no specific complaints. She denied having any significant fatigue or weakness. She denied having any dizzy spells. She denied having any significant chest pain, shortness of breath, cough or hemoptysis. She denied having any bleeding issues. The patient denied having any significant weight loss or night sweats. She had repeat CBC and iron study performed recently and she is here for evaluation and discussion of her lab results and treatment options.  MEDICAL HISTORY: Past Medical History  Diagnosis Date  . Polyneuropathy in other diseases classified elsewhere   . Narcolepsy   . Shoulder fracture, left   . Obesity   . Pneumonia   . Neuropathy   . Anemia     ALLERGIES:  is allergic to milk-related compounds; poison ivy extract; sulfur; tobacco; and orange juice.  MEDICATIONS:  Current Outpatient Prescriptions  Medication Sig Dispense Refill  . aspirin 81 MG tablet Take 81 mg by mouth daily.      Marland Kitchen FeFum-FePoly-FA-B Cmp-C-Biot (INTEGRA PLUS) CAPS Take 1 capsule by mouth every morning.  30 capsule  3  . gabapentin (NEURONTIN) 100 MG capsule TAKE 1 CAPSULE BY MOUTH THREE TIMES DAILY  270 capsule  0  . levothyroxine (LEVOTHROID) 25 MCG tablet Take 1 tablet (25 mcg total) by mouth daily before breakfast.  30 tablet  3  . Multiple Vitamins-Minerals (MULTIVITAMIN PO) Take by mouth daily.      . polyethylene glycol powder (SMOOTH LAX) powder Take 1 Container by mouth once.      . vitamin B-12 (CYANOCOBALAMIN) 1000 MCG tablet Take 5,000 mcg by mouth daily.        No current  facility-administered medications for this visit.    SURGICAL HISTORY:  Past Surgical History  Procedure Laterality Date  . Nasal sinus surgery    . Mouth surgery      REVIEW OF SYSTEMS:  A comprehensive review of systems was negative.   PHYSICAL EXAMINATION: General appearance: alert, cooperative and no distress Head: Normocephalic, without obvious abnormality, atraumatic Neck: no adenopathy, no JVD, supple, symmetrical, trachea midline and thyroid not enlarged, symmetric, no tenderness/mass/nodules Lymph nodes: Cervical, supraclavicular, and axillary nodes normal. Resp: clear to auscultation bilaterally Back: symmetric, no curvature. ROM normal. No CVA tenderness. Cardio: regular rate and rhythm, S1, S2 normal, no murmur, click, rub or gallop GI: soft, non-tender; bowel sounds normal; no masses,  no organomegaly Extremities: extremities normal, atraumatic, no cyanosis or edema  ECOG PERFORMANCE STATUS: 1 - Symptomatic but completely ambulatory  Blood pressure 133/60, pulse 64, temperature 98.2 F (36.8 C), temperature source Oral, resp. rate 17, height 5\' 3"  (1.6 m), weight 184 lb 4.8 oz (83.598 kg), SpO2 96.00%.  LABORATORY DATA: Lab Results  Component Value Date   WBC 6.4 06/08/2014   HGB 13.5 06/08/2014   HCT 41.1 06/08/2014   MCV 91.3 06/08/2014   PLT 234 06/08/2014      Chemistry      Component Value Date/Time   NA 135 07/27/2013 2006   K 4.0 07/27/2013 2006   CL 100 07/27/2013 2006   CO2 25 07/27/2013 2006   BUN 14 07/27/2013  2006   CREATININE 0.91 07/27/2013 2006      Component Value Date/Time   CALCIUM 9.4 07/27/2013 2006   ALKPHOS 67 07/27/2013 2006   AST 25 07/27/2013 2006   ALT 15 07/27/2013 2006   BILITOT 0.2* 07/27/2013 2006     Other lab results: Iron study and ferritin are still pending.  RADIOGRAPHIC STUDIES: No results found.  ASSESSMENT AND PLAN: This is a very pleasant 80 years old white female with iron deficiency anemia currently on treatment with  Integra  Plus 1 capsule by mouth daily.  Her CBC today showed no evidence for anemia of the iron study and ferritin are still pending. I recommended for the patient to continue on her current treatment with Integra plus. She'll come back for followup visit in 6 months with repeat CBC, iron study and ferritin. She was advised to call immediately if she has any concerning symptoms in the interval. The patient voices understanding of current disease status and treatment options and is in agreement with the current care plan.  All questions were answered. The patient knows to call the clinic with any problems, questions or concerns. We can certainly see the patient much sooner if necessary.  Disclaimer: This note was dictated with voice recognition software. Similar sounding words can inadvertently be transcribed and may not be corrected upon review.

## 2014-06-08 NOTE — Telephone Encounter (Signed)
gv and printed appt sched and avs for pt for Feb 2016 °

## 2014-06-21 ENCOUNTER — Other Ambulatory Visit: Payer: Self-pay | Admitting: Internal Medicine

## 2014-06-30 ENCOUNTER — Ambulatory Visit: Payer: Medicare Other | Admitting: Nurse Practitioner

## 2014-07-06 ENCOUNTER — Other Ambulatory Visit: Payer: Self-pay

## 2014-07-06 DIAGNOSIS — E079 Disorder of thyroid, unspecified: Secondary | ICD-10-CM

## 2014-07-06 MED ORDER — LEVOTHYROXINE SODIUM 25 MCG PO TABS: ORAL_TABLET | ORAL | Status: DC

## 2014-07-15 ENCOUNTER — Emergency Department (HOSPITAL_COMMUNITY): Payer: Medicare Other

## 2014-07-15 ENCOUNTER — Emergency Department (HOSPITAL_COMMUNITY)
Admission: EM | Admit: 2014-07-15 | Discharge: 2014-07-15 | Disposition: A | Discharge status: Home / Self Care | Payer: Medicare Other | Attending: Emergency Medicine | Admitting: Emergency Medicine

## 2014-07-15 ENCOUNTER — Encounter (HOSPITAL_COMMUNITY): Payer: Self-pay | Admitting: Emergency Medicine

## 2014-07-15 DIAGNOSIS — I498 Other specified cardiac arrhythmias: Secondary | ICD-10-CM | POA: Insufficient documentation

## 2014-07-15 DIAGNOSIS — Z8669 Personal history of other diseases of the nervous system and sense organs: Secondary | ICD-10-CM | POA: Diagnosis not present

## 2014-07-15 DIAGNOSIS — Z8781 Personal history of (healed) traumatic fracture: Secondary | ICD-10-CM | POA: Insufficient documentation

## 2014-07-15 DIAGNOSIS — Y9289 Other specified places as the place of occurrence of the external cause: Secondary | ICD-10-CM | POA: Diagnosis not present

## 2014-07-15 DIAGNOSIS — Z8701 Personal history of pneumonia (recurrent): Secondary | ICD-10-CM | POA: Diagnosis not present

## 2014-07-15 DIAGNOSIS — Y9389 Activity, other specified: Secondary | ICD-10-CM | POA: Insufficient documentation

## 2014-07-15 DIAGNOSIS — E669 Obesity, unspecified: Secondary | ICD-10-CM | POA: Diagnosis not present

## 2014-07-15 DIAGNOSIS — Z79899 Other long term (current) drug therapy: Secondary | ICD-10-CM | POA: Diagnosis not present

## 2014-07-15 DIAGNOSIS — R42 Dizziness and giddiness: Secondary | ICD-10-CM | POA: Diagnosis not present

## 2014-07-15 DIAGNOSIS — D649 Anemia, unspecified: Secondary | ICD-10-CM | POA: Insufficient documentation

## 2014-07-15 DIAGNOSIS — W540XXA Bitten by dog, initial encounter: Secondary | ICD-10-CM | POA: Insufficient documentation

## 2014-07-15 DIAGNOSIS — R55 Syncope and collapse: Secondary | ICD-10-CM | POA: Diagnosis not present

## 2014-07-15 DIAGNOSIS — Z7982 Long term (current) use of aspirin: Secondary | ICD-10-CM | POA: Diagnosis not present

## 2014-07-15 DIAGNOSIS — S61409A Unspecified open wound of unspecified hand, initial encounter: Secondary | ICD-10-CM | POA: Diagnosis present

## 2014-07-15 LAB — CBC
HCT: 38.9 % (ref 36.0–46.0)
Hemoglobin: 13.3 g/dL (ref 12.0–15.0)
MCH: 31.4 pg (ref 26.0–34.0)
MCHC: 34.2 g/dL (ref 30.0–36.0)
MCV: 91.7 fL (ref 78.0–100.0)
Platelets: 225 10*3/uL (ref 150–400)
RBC: 4.24 MIL/uL (ref 3.87–5.11)
RDW: 13.4 % (ref 11.5–15.5)
WBC: 5.7 10*3/uL (ref 4.0–10.5)

## 2014-07-15 LAB — CBG MONITORING, ED: Glucose-Capillary: 96 mg/dL (ref 70–99)

## 2014-07-15 LAB — BASIC METABOLIC PANEL
Anion gap: 12 (ref 5–15)
BUN: 15 mg/dL (ref 6–23)
CO2: 23 mEq/L (ref 19–32)
Calcium: 8.9 mg/dL (ref 8.4–10.5)
Chloride: 101 mEq/L (ref 96–112)
Creatinine, Ser: 0.87 mg/dL (ref 0.50–1.10)
GFR calc Af Amer: 70 mL/min — ABNORMAL LOW (ref >90)
GFR calc non Af Amer: 61 mL/min — ABNORMAL LOW (ref >90)
Glucose, Bld: 84 mg/dL (ref 70–99)
Potassium: 4.1 mEq/L (ref 3.7–5.3)
Sodium: 136 mEq/L — ABNORMAL LOW (ref 137–147)

## 2014-07-15 LAB — URINALYSIS, ROUTINE W REFLEX MICROSCOPIC
Bilirubin Urine: NEGATIVE
Glucose, UA: NEGATIVE mg/dL
Hgb urine dipstick: NEGATIVE
Ketones, ur: NEGATIVE mg/dL
Nitrite: NEGATIVE
Protein, ur: NEGATIVE mg/dL
Specific Gravity, Urine: 1.019 (ref 1.005–1.030)
Urobilinogen, UA: 0.2 mg/dL (ref 0.0–1.0)
pH: 5.5 (ref 5.0–8.0)

## 2014-07-15 LAB — URINE MICROSCOPIC-ADD ON

## 2014-07-15 LAB — TROPONIN I: Troponin I: <0.3 ng/mL (ref <0.30)

## 2014-07-15 MED ORDER — AMOXICILLIN-POT CLAVULANATE 875-125 MG PO TABS: 1.0000 | ORAL_TABLET | Freq: Two times a day (BID) | ORAL | Status: DC

## 2014-07-15 NOTE — ED Notes (Signed)
Pt ambulated to restroom w/ no difficulty, states "I feel much better".
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
## 2014-07-15 NOTE — ED Notes (Signed)
Patient transported to X-ray 

## 2014-07-15 NOTE — ED Notes (Signed)
Patient transported to CT 
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
## 2014-07-15 NOTE — ED Provider Notes (Signed)
CSN: [id number]     Arrival date & time 07/15/14  1328 History   First MD Initiated Contact with Patient 07/15/14 1502     Chief Complaint  Patient presents with  . Dizziness  . Extremity Laceration     (Consider location/radiation/quality/duration/timing/severity/associated sxs/prior Treatment) The history is provided by the patient and medical records. No language interpreter was used.    Krystal Moore is a 80 y.o. female  with a hx of polyneuropathy, narcolepsy, anemia presents to the Emergency Department complaining of gradual, persistent, slowly improving dizziness onset 27min PTA on her way to the ER.  Pt reports the dizziness felt as if she might pass out and was "blinking to keep my eyes open."  Pt reports that the episode was similar to previous episodes of narcolepsy but it was not associated with nausea per usual and she has never had to blink like that before.  Pt reports she normally takes 5061mcg of cyanocobalamin per day to keep her awake, but did not take it this AM.  Pt denies palpitations, CP, SOB, nausea, vomiting, tremors, diaphoresis, weakness, numbness. Associated symptoms include feeling very flushed.  Pt reports she was able to ambulate into the ER, feeling unsteady and was immediately placed in a wheelchair.  Pt reports that she is beginning to feel better now.    Pt reports that yesterday she was playing with her puppy and the dog's tooth caught the back of her right hand tearing the skin yesterday around noon.  Pt reports cleaning the wound well at home, but bleeding persisted.  She reports taking ASA QD, but did not take it today to help stop the bleeding.  Pt denies use of other blood thinners.    Past Medical History  Diagnosis Date  . Polyneuropathy in other diseases classified elsewhere   . Narcolepsy   . Shoulder fracture, left   . Obesity   . Pneumonia   . Neuropathy   . Anemia    Past Surgical History  Procedure Laterality Date  . Nasal  sinus surgery    . Mouth surgery     Family History  Problem Relation Age of Onset  . Stroke Mother     questionable  . Heart attack Father   . Clotting disorder Mother    History  Substance Use Topics  . Smoking status: Never Smoker   . Smokeless tobacco: Never Used  . Alcohol Use: Yes     Comment: 1/4 glass per day   OB History   Grav Para Term Preterm Abortions TAB SAB Ect Mult Living                 Review of Systems  Constitutional: Negative for fever, diaphoresis, appetite change, fatigue and unexpected weight change.  HENT: Negative for mouth sores.   Eyes: Negative for visual disturbance.  Respiratory: Negative for cough, chest tightness, shortness of breath and wheezing.   Cardiovascular: Negative for chest pain.  Gastrointestinal: Negative for nausea, vomiting, abdominal pain, diarrhea and constipation.  Endocrine: Negative for polydipsia, polyphagia and polyuria.  Genitourinary: Negative for dysuria, urgency, frequency and hematuria.  Musculoskeletal: Negative for back pain and neck stiffness.  Skin: Positive for wound. Negative for rash.  Allergic/Immunologic: Negative for immunocompromised state.  Neurological: Positive for syncope (near) and light-headedness. Negative for headaches.  Hematological: Does not bruise/bleed easily.  Psychiatric/Behavioral: Negative for sleep disturbance. The patient is not nervous/anxious.       Allergies  Milk-related compounds; Poison ivy extract; Sulfur; Tobacco;  and Orange juice  Home Medications   Prior to Admission medications   Medication Sig Start Date End Date Taking? Authorizing Provider  aspirin 81 MG tablet Take 81 mg by mouth daily.   Yes Historical Provider, MD  Cyanocobalamin 5000 MCG CAPS Take 5,000 mcg by mouth daily.   Yes Historical Provider, MD  FeFum-FePoly-FA-B Cmp-C-Biot (INTEGRA PLUS) CAPS Take 1 capsule by mouth every morning. 04/21/14  Yes Curt Bears, MD  gabapentin (NEURONTIN) 100 MG capsule  Take 100 mg by mouth 3 (three) times daily.   Yes Historical Provider, MD  ibuprofen (ADVIL,MOTRIN) 200 MG tablet Take 200 mg by mouth every 6 (six) hours as needed.   Yes Historical Provider, MD  levothyroxine (SYNTHROID, LEVOTHROID) 25 MCG tablet Take 25 mcg by mouth daily before breakfast.   Yes Historical Provider, MD  Multiple Vitamins-Minerals (MULTIVITAMIN PO) Take 1 tablet by mouth daily.    Yes Historical Provider, MD  polyethylene glycol (MIRALAX / GLYCOLAX) packet Take 17 g by mouth daily.   Yes Historical Provider, MD  amoxicillin-clavulanate (AUGMENTIN) 875-125 MG per tablet Take 1 tablet by mouth every 12 (twelve) hours. 07/15/14   Sandi Towe, PA-C   BP 134/58  Pulse 50  Temp(Src) 98.6 F (37 C) (Oral)  Resp 11  SpO2 99% Physical Exam  Nursing note and vitals reviewed. Constitutional: She is oriented to person, place, and time. She appears well-developed and well-nourished. No distress.  HENT:  Head: Normocephalic and atraumatic.  Mouth/Throat: Oropharynx is clear and moist.  Eyes: Conjunctivae and EOM are normal. Pupils are equal, round, and reactive to light. No scleral icterus.  No horizontal, vertical or rotational nystagmus  Neck: Normal range of motion. Neck supple.  Full active and passive ROM without pain No midline or paraspinal tenderness No nuchal rigidity or meningeal signs  Cardiovascular: Regular rhythm, S1 normal, S2 normal, normal heart sounds and intact distal pulses.  Bradycardia present.   No murmur heard. Pulses:      Radial pulses are 2+ on the right side, and 2+ on the left side.       Dorsalis pedis pulses are 2+ on the right side, and 2+ on the left side.       Posterior tibial pulses are 2+ on the right side, and 2+ on the left side.  Capillary refill < 3 sec  Pulmonary/Chest: Effort normal and breath sounds normal. No respiratory distress. She has no wheezes. She has no rales.  Abdominal: Soft. Bowel sounds are normal. There is no  tenderness. There is no rebound and no guarding.  Musculoskeletal: Normal range of motion.  Full range of motion of all fingers of the right hand; no pain to palpation of the extensor tendons and full flexion and extension of the hand wrist  Lymphadenopathy:    She has no cervical adenopathy.  Neurological: She is alert and oriented to person, place, and time. She has normal reflexes. No cranial nerve deficit. She exhibits normal muscle tone. Coordination normal.  Mental Status:  Alert, oriented, thought content appropriate. Speech fluent without evidence of aphasia. Able to follow 2 step commands without difficulty.  Cranial Nerves:  II:  Peripheral visual fields grossly normal, pupils equal, round, reactive to light III,IV, VI: ptosis not present, extra-ocular motions intact bilaterally  V,VII: smile symmetric, facial light touch sensation equal VIII: hearing grossly normal bilaterally  IX,X: gag reflex present  XI: bilateral shoulder shrug equal and strong XII: midline tongue extension  Motor:  5/5 in upper and lower  extremities bilaterally including strong and equal grip strength and dorsiflexion/plantar flexion Sensory: Pinprick and light touch normal in all extremities.  Deep Tendon Reflexes: 2+ and symmetric  Cerebellar: normal finger-to-nose with bilateral upper extremities Gait: gait testing deferred CV: distal pulses palpable throughout   Skin: Skin is warm and dry. No rash noted. She is not diaphoretic.  4 cm skin tear to the dorsum of the right hand; mild, surrounding erythema without induration or evidence of abscess, no pain to palpation  Psychiatric: She has a normal mood and affect. Her behavior is normal. Judgment and thought content normal.    ED Course  LACERATION REPAIR Date/Time: 07/15/2014 5:14 PM Performed by: Abigail Butts Authorized by: Abigail Butts Consent: Verbal consent obtained. Risks and benefits: risks, benefits and alternatives were  discussed Consent given by: patient Patient understanding: patient states understanding of the procedure being performed Patient consent: the patient's understanding of the procedure matches consent given Procedure consent: procedure consent matches procedure scheduled Relevant documents: relevant documents present and verified Site marked: the operative site was marked Required items: required blood products, implants, devices, and special equipment available Patient identity confirmed: verbally with patient and arm band Time out: Immediately prior to procedure a "time out" was called to verify the correct patient, procedure, equipment, support staff and site/side marked as required. Body area: upper extremity Location details: right hand Laceration length: 4 cm Foreign bodies: no foreign bodies Tendon involvement: none Nerve involvement: none Vascular damage: no Patient sedated: no Preparation: Patient was prepped and draped in the usual sterile fashion. Irrigation solution: saline Irrigation method: syringe Amount of cleaning: extensive Skin closure: Steri-Strips Approximation: loose Approximation difficulty: complex Dressing: 4x4 sterile gauze Patient tolerance: Patient tolerated the procedure well with no immediate complications.   (including critical care time) Labs Review Labs Reviewed  BASIC METABOLIC PANEL - Abnormal; Notable for the following:    Sodium 136 (*)    GFR calc non Af Amer 61 (*)    GFR calc Af Amer 70 (*)    All other components within normal limits  URINALYSIS, ROUTINE W REFLEX MICROSCOPIC - Abnormal; Notable for the following:    Leukocytes, UA TRACE (*)    All other components within normal limits  CBC  TROPONIN I  URINE MICROSCOPIC-ADD ON  CBG MONITORING, ED    Imaging Review Dg Chest 2 View  07/15/2014   CLINICAL DATA:  80 year old female with low blood pressure and dizziness.  EXAM: CHEST - 2 VIEW  COMPARISON:  Chest x-ray 02/08/2009 CT  abdomen pelvis 03/22/2010  FINDINGS: Cardiac size borderline enlarged, similar to prior.  Density overlying the mediastinum just above the diaphragm is present on both anterior and lateral views, compatible with large hiatal hernia present on comparison abdominal CT.  No evidence of pulmonary vascular congestion.  No confluent airspace disease.  No pneumothorax.  No pleural fluid.  Atherosclerotic changes of the aortic arch.  No displaced fracture. Posttraumatic changes of the left humeral head. This was present on upper extremity CT 05/07/2009. Multilevel degenerative disc disease of the visualized spine.  IMPRESSION: No radiographic evidence of acute cardiopulmonary disease.  Large hiatal hernia, which was present at the time of abdominal C5 11/21/2009.  Atherosclerosis  Posttraumatic changes of the left humeral head.  Signed,  Dulcy Fanny. Earleen Newport, DO  [facility]  [facility]   Electronically Signed   By: Corrie Mckusick O.D.   On: 07/15/2014 15:32   Ct Head Wo Contrast  07/15/2014  CLINICAL DATA:  Dizziness for 1 day, no known injury  EXAM: CT HEAD WITHOUT CONTRAST  TECHNIQUE: Contiguous axial images were obtained from the base of the skull through the vertex without intravenous contrast.  COMPARISON:  None  FINDINGS: Generalized atrophy.  Normal ventricular morphology.  No midline shift or mass effect.  No intracranial hemorrhage, mass lesion, or evidence acute infarction.  No extra-axial fluid collections.  Opacification of RIGHT maxillary sinus with osseous wall thickening suggesting chronic sinusitis.  Bones otherwise demineralized.  Remaining sinuses and mastoid air cells clear.  IMPRESSION: Generalized atrophy.  No acute intracranial abnormalities.  Question chronic RIGHT maxillary sinusitis.   Electronically Signed   By: Lavonia Dana M.D.   On: 07/15/2014 16:38     EKG Interpretation   Date/Time:  Wednesday July 15 2014 13:30:56 EDT Ventricular Rate:   60 PR Interval:  188 QRS Duration: 96 QT Interval:  430 QTC Calculation: 430 R Axis:   -47 Text Interpretation:  Normal sinus rhythm Left axis deviation Cannot rule  out Anterior infarct , age undetermined Abnormal ECG No significant change  since last tracing Confirmed by GOLDSTON  MD, [place] ([id number]) on 07/15/2014  4:41:44 PM      MDM   Final diagnoses:  Dog bite  Near syncope   MEKA LEWAN presents with laceration to the posterior right hand from her dog and near syncopal episode this afternoon.  Wound is a skin tear with mild erythema, but no induration abscess or significant secondary infection.  Pt's near syncope is concerning for possible cardiac vs TIA event.  Pt reports feeling normal at this time.  Will obtain work-up for both.    5:12 PM Labs and imaging reassuring without evidence of infection, MI, PNA or hemorrhagic stroke.  Pt with normal neurologic exam and ambulates without gait disturbance or balance problems.    Skin tear repaired with steri strips and pt will be d/c on Augmentin.    I have personally reviewed patient's vitals, nursing note and any pertinent labs or imaging.  I performed an undressed physical exam.    It has been determined that no acute conditions requiring further emergency intervention are present at this time. The patient/guardian have been advised of the diagnosis and plan. I reviewed all labs and imaging including any potential incidental findings. We have discussed signs and symptoms that warrant return to the ED, such as fever, chills, extending redness, additional near syncope events.  Patient/guardian has voiced understanding and agreed to follow-up with the PCP or specialist in 3 days.  Vital signs are stable at discharge.   BP 134/58  Pulse 50  Temp(Src) 98.6 F (37 C) (Oral)  Resp 11  SpO2 99%  The patient was discussed with and seen by Dr. Regenia Skeeter who agrees with the treatment plan.    Jarrett Soho Jenika Chiem,  PA-C 07/15/14 1750

## 2014-07-15 NOTE — Discharge Instructions (Signed)
1. Medications:  usual home medications 2. Treatment: rest, drink plenty of fluids, keep wound clean with warm soap and water, pat dry 3. Follow Up: Please followup with your primary doctor in 24 hours for discussion of your diagnoses and further evaluation after today's visit; please return to emergency department for repeat events similar to what you had today, chest pain, shortness of breath, worsening redness or pain in the hand or other concerning symptoms   Animal Bite An animal bite can result in a scratch on the skin, deep open cut, puncture of the skin, crush injury, or tearing away of the skin or a body part. Dogs are responsible for most animal bites. Children are bitten more often than adults. An animal bite can range from very mild to more serious. A small bite from your house pet is no cause for alarm. However, some animal bites can become infected or injure a bone or other tissue. You must seek medical care if:  The skin is broken and bleeding does not slow down or stop after 15 minutes.  The puncture is deep and difficult to clean (such as a cat bite).  Pain, warmth, redness, or pus develops around the wound.  The bite is from a stray animal or rodent. There may be a risk of rabies infection.  The bite is from a snake, raccoon, skunk, fox, coyote, or bat. There may be a risk of rabies infection.  The person bitten has a chronic illness such as diabetes, liver disease, or cancer, or the person takes medicine that lowers the immune system.  There is concern about the location and severity of the bite. It is important to clean and protect an animal bite wound right away to prevent infection. Follow these steps:  Clean the wound with plenty of water and soap.  Apply an antibiotic cream.  Apply gentle pressure over the wound with a clean towel or gauze to slow or stop bleeding.  Elevate the affected area above the heart to help stop any bleeding.  Seek medical care. Getting  medical care within 8 hours of the animal bite leads to the best possible outcome. DIAGNOSIS  Your caregiver will most likely:  Take a detailed history of the animal and the bite injury.  Perform a wound exam.  Take your medical history. Blood tests or X-rays may be performed. Sometimes, infected bite wounds are cultured and sent to a lab to identify the infectious bacteria.  TREATMENT  Medical treatment will depend on the location and type of animal bite as well as the patient's medical history. Treatment may include:  Wound care, such as cleaning and flushing the wound with saline solution, bandaging, and elevating the affected area.  Antibiotics.  Tetanus immunization.  Rabies immunization.  Leaving the wound open to heal. This is often done with animal bites, due to the high risk of infection. However, in certain cases, wound closure with stitches, wound adhesive, skin adhesive strips, or staples may be used. Infected bites that are left untreated may require intravenous (IV) antibiotics and surgical treatment in the hospital. [place]  Follow your caregiver's instructions for wound care.  Take all medicines as directed.  If your caregiver prescribes antibiotics, take them as directed. Finish them even if you start to feel better.  Follow up with your caregiver for further exams or immunizations as directed. You may need a tetanus shot if:  You cannot remember when you had your last tetanus shot.  You have never  had a tetanus shot.  The injury broke your skin. If you get a tetanus shot, your arm may swell, get red, and feel warm to the touch. This is common and not a problem. If you need a tetanus shot and you choose not to have one, there is a rare chance of getting tetanus. Sickness from tetanus can be serious. SEEK MEDICAL CARE IF:  You notice warmth, redness, soreness, swelling, pus discharge, or a bad smell coming from the wound.  You have a red  line on the skin coming from the wound.  You have a fever, chills, or a general ill feeling.  You have nausea or vomiting.  You have continued or worsening pain.  You have trouble moving the injured part.  You have other questions or concerns. MAKE SURE YOU:  Understand these instructions.  Will watch your condition.  Will get help right away if you are not doing well or get worse. Document Released: 06/27/2011 Document Revised: 01/01/2012 Document Reviewed: 06/27/2011 [facility] Patient Information 2015 [place], [place]. This information is not intended to replace advice given to you by your health care provider. Make sure you discuss any questions you have with your health care provider.

## 2014-07-16 NOTE — ED Provider Notes (Signed)
Medical screening examination/treatment/procedure(s) were conducted as a shared visit with non-physician practitioner(s) and myself.  I personally evaluated the patient during the encounter.   EKG Interpretation   Date/Time:  Wednesday July 15 2014 13:30:56 EDT Ventricular Rate:  60 PR Interval:  188 QRS Duration: 96 QT Interval:  430 QTC Calculation: 430 R Axis:   -47 Text Interpretation:  Normal sinus rhythm Left axis deviation Cannot rule  out Anterior infarct , age undetermined Abnormal ECG No significant change  since last tracing Confirmed by Kitt Ledet  MD, Earl Losee ([id number]) on 07/15/2014  4:41:44 PM       Patient with transient dizziness on ride over to hospital to get right hand dog bite evaluated. Discussed workup and recommended observation to r/o TIA/CVA vs arrythmia. Could be related to pain and some bleeding from her hand. She declines admission and is completely asymptomatic. Will have her f/u closely with PCP and treat hand with abx.  Ephraim Hamburger, MD 07/16/14 2258

## 2014-07-22 ENCOUNTER — Telehealth: Payer: Self-pay | Admitting: Internal Medicine

## 2014-07-22 NOTE — Telephone Encounter (Signed)
Pt. Called with some concerns in regards to her visit to the ED, pt was bit by a dog and hand is swelling... Pt. Is concerned because the ED stated that there should not be any swelling. Pt. Has an upcoming appt. On 07/28/2014.Please f/u with pt.

## 2014-07-23 ENCOUNTER — Other Ambulatory Visit: Payer: Self-pay | Admitting: Internal Medicine

## 2014-07-28 ENCOUNTER — Ambulatory Visit: Payer: Medicare Other | Attending: Internal Medicine | Admitting: Internal Medicine

## 2014-07-28 ENCOUNTER — Encounter: Payer: Self-pay | Admitting: Internal Medicine

## 2014-07-28 VITALS — BP 121/78 | HR 74 | Temp 98.6°F | Resp 16

## 2014-07-28 DIAGNOSIS — Z7982 Long term (current) use of aspirin: Secondary | ICD-10-CM | POA: Insufficient documentation

## 2014-07-28 DIAGNOSIS — Z91018 Allergy to other foods: Secondary | ICD-10-CM | POA: Insufficient documentation

## 2014-07-28 DIAGNOSIS — W540XXD Bitten by dog, subsequent encounter: Secondary | ICD-10-CM | POA: Diagnosis not present

## 2014-07-28 DIAGNOSIS — Z23 Encounter for immunization: Secondary | ICD-10-CM | POA: Insufficient documentation

## 2014-07-28 DIAGNOSIS — Z91011 Allergy to milk products: Secondary | ICD-10-CM | POA: Diagnosis not present

## 2014-07-28 DIAGNOSIS — S61451D Open bite of right hand, subsequent encounter: Secondary | ICD-10-CM | POA: Insufficient documentation

## 2014-07-28 DIAGNOSIS — Z91048 Other nonmedicinal substance allergy status: Secondary | ICD-10-CM | POA: Diagnosis not present

## 2014-07-28 DIAGNOSIS — E669 Obesity, unspecified: Secondary | ICD-10-CM | POA: Diagnosis not present

## 2014-07-28 DIAGNOSIS — R55 Syncope and collapse: Secondary | ICD-10-CM | POA: Insufficient documentation

## 2014-07-28 DIAGNOSIS — G629 Polyneuropathy, unspecified: Secondary | ICD-10-CM | POA: Insufficient documentation

## 2014-07-28 DIAGNOSIS — D649 Anemia, unspecified: Secondary | ICD-10-CM | POA: Insufficient documentation

## 2014-07-28 DIAGNOSIS — Z09 Encounter for follow-up examination after completed treatment for conditions other than malignant neoplasm: Secondary | ICD-10-CM

## 2014-07-28 DIAGNOSIS — E039 Hypothyroidism, unspecified: Secondary | ICD-10-CM | POA: Diagnosis not present

## 2014-07-28 DIAGNOSIS — Z8701 Personal history of pneumonia (recurrent): Secondary | ICD-10-CM | POA: Insufficient documentation

## 2014-07-28 DIAGNOSIS — Z882 Allergy status to sulfonamides status: Secondary | ICD-10-CM | POA: Insufficient documentation

## 2014-07-28 DIAGNOSIS — Z79899 Other long term (current) drug therapy: Secondary | ICD-10-CM | POA: Diagnosis not present

## 2014-07-28 DIAGNOSIS — T148 Other injury of unspecified body region: Secondary | ICD-10-CM

## 2014-07-28 DIAGNOSIS — W540XXA Bitten by dog, initial encounter: Secondary | ICD-10-CM

## 2014-07-28 DIAGNOSIS — G47419 Narcolepsy without cataplexy: Secondary | ICD-10-CM | POA: Diagnosis not present

## 2014-07-28 MED ORDER — AMOXICILLIN-POT CLAVULANATE 875-125 MG PO TABS
1.0000 | ORAL_TABLET | Freq: Two times a day (BID) | ORAL | Status: DC
Start: 2014-07-28 — End: 2014-12-14

## 2014-07-28 NOTE — Progress Notes (Signed)
MRN: [id number] Name: Krystal Moore  Sex: female Age: 80 y.o. DOB: 26-Jan-1930  Allergies: Milk-related compounds; Poison ivy extract; Sulfur; Tobacco; and Orange juice  Chief Complaint  Patient presents with  . Follow-up    HPI: Patient is 80 y.o. female who recently went to the emergency room with a laceration on the posterior aspect of right hand after she was bit by  her dog and had a syncopal episode , EMR reviewed the syncopal workup was negative patient had a laceration repair done and skin closure with Steri-Strips and patient was given prescription for Augmentin. As per patient she has not taken antibiotic denies any fever chills any discharge from the wound but has some tenderness and erythema. As per patient her last tetanus shot was more than 10 years ago.   Past Medical History  Diagnosis Date  . Polyneuropathy in other diseases classified elsewhere   . Narcolepsy   . Shoulder fracture, left   . Obesity   . Pneumonia   . Neuropathy   . Anemia     Past Surgical History  Procedure Laterality Date  . Nasal sinus surgery    . Mouth surgery        Medication List       This list is accurate as of: 07/28/14 10:18 AM.  Always use your most recent med list.               amoxicillin-clavulanate 875-125 MG per tablet  Commonly known as:  AUGMENTIN  Take 1 tablet by mouth every 12 (twelve) hours.     aspirin 81 MG tablet  Take 81 mg by mouth daily.     Cyanocobalamin 5000 MCG Caps  Take 5,000 mcg by mouth daily.     gabapentin 100 MG capsule  Commonly known as:  NEURONTIN  Take 100 mg by mouth 3 (three) times daily.     ibuprofen 200 MG tablet  Commonly known as:  ADVIL,MOTRIN  Take 200 mg by mouth every 6 (six) hours as needed.     INTEGRA PLUS Caps  Take 1 capsule by mouth every morning.     levothyroxine 25 MCG tablet  Commonly known as:  SYNTHROID, LEVOTHROID  Take 25 mcg by mouth daily before breakfast.     MULTIVITAMIN PO    Take 1 tablet by mouth daily.     polyethylene glycol packet  Commonly known as:  MIRALAX / GLYCOLAX  Take 17 g by mouth daily.        Meds ordered this encounter  Medications  . amoxicillin-clavulanate (AUGMENTIN) 875-125 MG per tablet    Sig: Take 1 tablet by mouth every 12 (twelve) hours.    Dispense:  20 tablet    Refill:  0    There is no immunization history for the selected administration types on file for this patient.  Family History  Problem Relation Age of Onset  . Stroke Mother     questionable  . Heart attack Father   . Clotting disorder Mother     History  Substance Use Topics  . Smoking status: Never Smoker   . Smokeless tobacco: Never Used  . Alcohol Use: Yes     Comment: 1/4 glass per day    Review of Systems   As noted in HPI  Filed Vitals:   07/28/14 0940  BP: 121/78  Pulse: 74  Temp: 98.6 F (37 C)  Resp: 16    Physical Exam  Physical Exam  Constitutional:  No distress.  Eyes: EOM are normal. Pupils are equal, round, and reactive to light.  Cardiovascular: Normal rate and regular rhythm.   Pulmonary/Chest: Breath sounds normal. No respiratory distress. She has no wheezes. She has no rales.  Musculoskeletal:  Right hand wound look clean some surrounding erythema and swelling minimal difficulty with full flexion of index finger due to swelling    CBC    Component Value Date/Time   WBC 5.7 07/15/2014 1511   WBC 6.4 06/08/2014 1313   RBC 4.24 07/15/2014 1511   RBC 4.51 06/08/2014 1313   RBC 3.88 09/16/2013 0944   HGB 13.3 07/15/2014 1511   HGB 13.5 06/08/2014 1313   HCT 38.9 07/15/2014 1511   HCT 41.1 06/08/2014 1313   PLT 225 07/15/2014 1511   PLT 234 06/08/2014 1313   MCV 91.7 07/15/2014 1511   MCV 91.3 06/08/2014 1313   LYMPHSABS 1.6 06/08/2014 1313   LYMPHSABS 1.3 09/16/2013 0944   MONOABS 0.5 06/08/2014 1313   MONOABS 0.2 09/16/2013 0944   EOSABS 0.2 06/08/2014 1313   EOSABS 0.2 09/16/2013 0944   BASOSABS 0.0 06/08/2014 1313    BASOSABS 0.0 09/16/2013 0944    CMP     Component Value Date/Time   NA 136* 07/15/2014 1511   K 4.1 07/15/2014 1511   CL 101 07/15/2014 1511   CO2 23 07/15/2014 1511   GLUCOSE 84 07/15/2014 1511   BUN 15 07/15/2014 1511   CREATININE 0.87 07/15/2014 1511   CALCIUM 8.9 07/15/2014 1511   PROT 6.5 09/25/2013 1107   PROT 7.2 07/27/2013 2006   ALBUMIN 3.7 07/27/2013 2006   AST 25 07/27/2013 2006   ALT 15 07/27/2013 2006   ALKPHOS 67 07/27/2013 2006   BILITOT 0.2* 07/27/2013 2006   GFRNONAA 61* 07/15/2014 1511   GFRAA 70* 07/15/2014 1511    No results found for this basename: chol, tri, ldl    No components found with this basename: hga1c    Lab Results  Component Value Date/Time   AST 25 07/27/2013  8:06 PM    Assessment and Plan  Dog bite - Plan: I have given patient prescription for Augmentin to take for 10 days, she'll take ibuprofen when necessary for pain and swelling amoxicillin-clavulanate (AUGMENTIN) 875-125 MG per tablet  She'll come back in 2 weeks for nurse visit.  Need for Tdap vaccination - Plan: Tdap vaccine greater than or equal to 7yo IM given today.     Return in about 3 months (around 10/28/2014) for wound check in  2 weeks/Nurse Visit.  Lorayne Marek, MD

## 2014-07-28 NOTE — Progress Notes (Signed)
Patient here for a follow up from the ED Has a bite from her dog to the top of her right hand Area is still red and tender to the touch

## 2014-07-29 ENCOUNTER — Telehealth: Payer: Self-pay | Admitting: Nurse Practitioner

## 2014-07-29 ENCOUNTER — Ambulatory Visit: Payer: Medicare Other | Admitting: Nurse Practitioner

## 2014-07-29 NOTE — Telephone Encounter (Signed)
No show for scheduled appointment 

## 2014-08-06 ENCOUNTER — Other Ambulatory Visit: Payer: Self-pay | Admitting: Internal Medicine

## 2014-08-12 ENCOUNTER — Other Ambulatory Visit: Payer: Self-pay

## 2014-08-12 MED ORDER — GABAPENTIN 100 MG PO CAPS: 100.0000 mg | ORAL_CAPSULE | Freq: Three times a day (TID) | ORAL | Status: DC

## 2014-08-19 ENCOUNTER — Other Ambulatory Visit: Payer: Self-pay | Admitting: *Deleted

## 2014-08-19 DIAGNOSIS — D649 Anemia, unspecified: Secondary | ICD-10-CM

## 2014-08-19 MED ORDER — INTEGRA PLUS PO CAPS: 1.0000 | ORAL_CAPSULE | Freq: Every morning | ORAL | Status: DC

## 2014-08-20 ENCOUNTER — Other Ambulatory Visit: Payer: Self-pay | Admitting: Internal Medicine

## 2014-08-20 ENCOUNTER — Other Ambulatory Visit: Payer: Self-pay | Admitting: Emergency Medicine

## 2014-09-09 ENCOUNTER — Encounter: Payer: Self-pay | Admitting: Neurology

## 2014-09-15 ENCOUNTER — Encounter: Payer: Self-pay | Admitting: Neurology

## 2014-09-23 ENCOUNTER — Other Ambulatory Visit: Payer: Self-pay | Admitting: Internal Medicine

## 2014-11-15 ENCOUNTER — Other Ambulatory Visit: Payer: Self-pay | Admitting: Emergency Medicine

## 2014-11-15 ENCOUNTER — Other Ambulatory Visit: Payer: Self-pay | Admitting: Internal Medicine

## 2014-11-21 ENCOUNTER — Other Ambulatory Visit: Payer: Self-pay | Admitting: Internal Medicine

## 2014-12-07 ENCOUNTER — Other Ambulatory Visit: Payer: Medicare Other

## 2014-12-08 ENCOUNTER — Telehealth: Payer: Self-pay | Admitting: Internal Medicine

## 2014-12-08 ENCOUNTER — Ambulatory Visit (HOSPITAL_BASED_OUTPATIENT_CLINIC_OR_DEPARTMENT_OTHER): Payer: Medicare HMO

## 2014-12-08 DIAGNOSIS — D649 Anemia, unspecified: Secondary | ICD-10-CM

## 2014-12-08 DIAGNOSIS — D509 Iron deficiency anemia, unspecified: Secondary | ICD-10-CM

## 2014-12-08 LAB — IRON AND TIBC CHCC
%SAT: 30 % (ref 21–57)
Iron: 87 ug/dL (ref 41–142)
TIBC: 285 ug/dL (ref 236–444)
UIBC: 198 ug/dL (ref 120–384)

## 2014-12-08 LAB — CBC WITH DIFFERENTIAL/PLATELET
BASO%: 1 % (ref 0.0–2.0)
Basophils Absolute: 0.1 10*3/uL (ref 0.0–0.1)
EOS%: 3 % (ref 0.0–7.0)
Eosinophils Absolute: 0.2 10*3/uL (ref 0.0–0.5)
HCT: 41.7 % (ref 34.8–46.6)
HGB: 13.5 g/dL (ref 11.6–15.9)
LYMPH%: 29.3 % (ref 14.0–49.7)
MCH: 30.2 pg (ref 25.1–34.0)
MCHC: 32.4 g/dL (ref 31.5–36.0)
MCV: 93.3 fL (ref 79.5–101.0)
MONO#: 0.3 10*3/uL (ref 0.1–0.9)
MONO%: 5.4 % (ref 0.0–14.0)
NEUT#: 3.2 10*3/uL (ref 1.5–6.5)
NEUT%: 61.3 % (ref 38.4–76.8)
Platelets: 253 10*3/uL (ref 145–400)
RBC: 4.47 10*6/uL (ref 3.70–5.45)
RDW: 13.5 % (ref 11.2–14.5)
WBC: 5.2 10*3/uL (ref 3.9–10.3)
lymph#: 1.5 10*3/uL (ref 0.9–3.3)

## 2014-12-08 LAB — FERRITIN CHCC: Ferritin: 39 ng/ml (ref 9–269)

## 2014-12-08 NOTE — Telephone Encounter (Signed)
pt called to r/s missed appt.....done...pt comming in today

## 2014-12-14 ENCOUNTER — Ambulatory Visit (HOSPITAL_BASED_OUTPATIENT_CLINIC_OR_DEPARTMENT_OTHER): Payer: Medicare HMO | Admitting: Internal Medicine

## 2014-12-14 ENCOUNTER — Encounter: Payer: Self-pay | Admitting: Internal Medicine

## 2014-12-14 ENCOUNTER — Telehealth: Payer: Self-pay | Admitting: Internal Medicine

## 2014-12-14 VITALS — BP 142/70 | HR 65 | Temp 97.7°F | Resp 18 | Ht 63.0 in | Wt 189.3 lb

## 2014-12-14 DIAGNOSIS — D509 Iron deficiency anemia, unspecified: Secondary | ICD-10-CM

## 2014-12-14 NOTE — Progress Notes (Signed)
[facility] [facility] [phone]   [phone] OFFICE PROGRESS NOTE  Lorayne Marek, MD [place] [address]  DIAGNOSIS: Iron deficiency anemia   PRIOR THERAPY: None   CURRENT THERAPY: Integra plus, 1 capsule daily  INTERVAL HISTORY: Krystal Moore 81 y.o. female returns to the clinic today for 6 months followup visit. The patient is feeling fine today with no specific complaints. She eats more liver. She denied having any significant fatigue or weakness. She denied having any dizzy spells. She denied having any significant chest pain, shortness of breath, cough or hemoptysis. She denied having any bleeding issues. The patient denied having any significant weight loss or night sweats. She had repeat CBC and iron study performed recently and she is here for evaluation and discussion of her lab results and treatment options.  MEDICAL HISTORY: Past Medical History  Diagnosis Date  . Polyneuropathy in other diseases classified elsewhere   . Narcolepsy   . Shoulder fracture, left   . Obesity   . Pneumonia   . Neuropathy   . Anemia     ALLERGIES:  is allergic to milk-related compounds; poison ivy extract; sulfur; tobacco; and orange juice.  MEDICATIONS:  Current Outpatient Prescriptions  Medication Sig Dispense Refill  . aspirin 81 MG tablet Take 81 mg by mouth daily.    . Cyanocobalamin 5000 MCG CAPS Take 5,000 mcg by mouth daily.    Marland Kitchen gabapentin (NEURONTIN) 100 MG capsule TAKE 1 CAPSULE BY MOUTH THREE TIMES DAILY 270 capsule 0  . levothyroxine (SYNTHROID, LEVOTHROID) 25 MCG tablet TAKE 1 TABLET BY MOUTH DAILY BEFORE BREAKFAST 30 tablet 0  . Multiple Vitamins-Minerals (MULTIVITAMIN PO) Take 1 tablet by mouth daily.     . polyethylene glycol (MIRALAX / GLYCOLAX) packet Take 17 g by mouth daily.    Marland Kitchen FeFum-FePoly-FA-B Cmp-C-Biot (INTEGRA PLUS) CAPS Take 1 capsule by mouth every morning. (Patient not taking: Reported on  12/14/2014) 30 capsule 3  . ibuprofen (ADVIL,MOTRIN) 200 MG tablet Take 200 mg by mouth every 6 (six) hours as needed.     No current facility-administered medications for this visit.    SURGICAL HISTORY:  Past Surgical History  Procedure Laterality Date  . Nasal sinus surgery    . Mouth surgery      REVIEW OF SYSTEMS:  A comprehensive review of systems was negative.   PHYSICAL EXAMINATION: General appearance: alert, cooperative and no distress Head: Normocephalic, without obvious abnormality, atraumatic Neck: no adenopathy, no JVD, supple, symmetrical, trachea midline and thyroid not enlarged, symmetric, no tenderness/mass/nodules Lymph nodes: Cervical, supraclavicular, and axillary nodes normal. Resp: clear to auscultation bilaterally Back: symmetric, no curvature. ROM normal. No CVA tenderness. Cardio: regular rate and rhythm, S1, S2 normal, no murmur, click, rub or gallop GI: soft, non-tender; bowel sounds normal; no masses,  no organomegaly Extremities: extremities normal, atraumatic, no cyanosis or edema  ECOG PERFORMANCE STATUS: 1 - Symptomatic but completely ambulatory  Blood pressure 142/70, pulse 65, temperature 97.7 F (36.5 C), temperature source Oral, resp. rate 18, height 5\' 3"  (1.6 m), weight 189 lb 4.8 oz (85.866 kg), SpO2 99 %.  LABORATORY DATA: Lab Results  Component Value Date   WBC 5.2 12/08/2014   HGB 13.5 12/08/2014   HCT 41.7 12/08/2014   MCV 93.3 12/08/2014   PLT 253 12/08/2014      Chemistry      Component Value Date/Time   NA 136* 07/15/2014 1511   K 4.1 07/15/2014 1511  CL 101 07/15/2014 1511   CO2 23 07/15/2014 1511   BUN 15 07/15/2014 1511   CREATININE 0.87 07/15/2014 1511      Component Value Date/Time   CALCIUM 8.9 07/15/2014 1511   ALKPHOS 67 07/27/2013 2006   AST 25 07/27/2013 2006   ALT 15 07/27/2013 2006   BILITOT 0.2* 07/27/2013 2006     Other lab results: Serum ferritin 39, serum iron 87, total iron binding capacity 285  and iron saturation 30%.  RADIOGRAPHIC STUDIES: No results found.  ASSESSMENT AND PLAN: This is a very pleasant 81 years old white female with iron deficiency anemia currently on treatment with Integra  Plus 1 capsule by mouth daily.  Her recent blood work including repeat CBC, iron study and ferritin are within the normal range. I recommended for the patient to continue on her current treatment with Integra plus. She'll come back for followup visit in 6 months with repeat CBC, iron study and ferritin. She was advised to call immediately if she has any concerning symptoms in the interval. The patient voices understanding of current disease status and treatment options and is in agreement with the current care plan.  All questions were answered. The patient knows to call the clinic with any problems, questions or concerns. We can certainly see the patient much sooner if necessary.  Disclaimer: This note was dictated with voice recognition software. Similar sounding words can inadvertently be transcribed and may not be corrected upon review.

## 2014-12-14 NOTE — Telephone Encounter (Signed)
lvm for pt regarding to Aug appt....mailed pt appt sched ///avs and letter °

## 2014-12-20 ENCOUNTER — Other Ambulatory Visit: Payer: Self-pay | Admitting: Internal Medicine

## 2015-01-05 ENCOUNTER — Other Ambulatory Visit: Payer: Self-pay | Admitting: Internal Medicine

## 2015-01-18 ENCOUNTER — Other Ambulatory Visit: Payer: Self-pay | Admitting: Internal Medicine

## 2015-03-09 ENCOUNTER — Other Ambulatory Visit: Payer: Self-pay | Admitting: Internal Medicine

## 2015-03-25 ENCOUNTER — Other Ambulatory Visit: Payer: Self-pay

## 2015-03-25 MED ORDER — LEVOTHYROXINE SODIUM 25 MCG PO TABS: ORAL_TABLET | ORAL | Status: DC

## 2015-04-28 ENCOUNTER — Other Ambulatory Visit: Payer: Self-pay | Admitting: Internal Medicine

## 2015-04-28 DIAGNOSIS — E039 Hypothyroidism, unspecified: Secondary | ICD-10-CM

## 2015-06-07 ENCOUNTER — Other Ambulatory Visit (HOSPITAL_BASED_OUTPATIENT_CLINIC_OR_DEPARTMENT_OTHER): Payer: Medicare HMO

## 2015-06-07 DIAGNOSIS — D509 Iron deficiency anemia, unspecified: Secondary | ICD-10-CM | POA: Diagnosis not present

## 2015-06-07 LAB — IRON AND TIBC CHCC
%SAT: 36 % (ref 21–57)
Iron: 114 ug/dL (ref 41–142)
TIBC: 315 ug/dL (ref 236–444)
UIBC: 201 ug/dL (ref 120–384)

## 2015-06-07 LAB — CBC WITH DIFFERENTIAL/PLATELET
BASO%: 1.3 % (ref 0.0–2.0)
Basophils Absolute: 0.1 10*3/uL (ref 0.0–0.1)
EOS%: 4.4 % (ref 0.0–7.0)
Eosinophils Absolute: 0.2 10*3/uL (ref 0.0–0.5)
HCT: 41 % (ref 34.8–46.6)
HGB: 13.5 g/dL (ref 11.6–15.9)
LYMPH%: 30.2 % (ref 14.0–49.7)
MCH: 30.3 pg (ref 25.1–34.0)
MCHC: 32.9 g/dL (ref 31.5–36.0)
MCV: 91.9 fL (ref 79.5–101.0)
MONO#: 0.4 10*3/uL (ref 0.1–0.9)
MONO%: 7.7 % (ref 0.0–14.0)
NEUT#: 2.9 10*3/uL (ref 1.5–6.5)
NEUT%: 56.4 % (ref 38.4–76.8)
Platelets: 228 10*3/uL (ref 145–400)
RBC: 4.46 10*6/uL (ref 3.70–5.45)
RDW: 13.6 % (ref 11.2–14.5)
WBC: 5.1 10*3/uL (ref 3.9–10.3)
lymph#: 1.5 10*3/uL (ref 0.9–3.3)

## 2015-06-07 LAB — FERRITIN CHCC: Ferritin: 32 ng/ml (ref 9–269)

## 2015-06-11 ENCOUNTER — Other Ambulatory Visit: Payer: Self-pay | Admitting: Internal Medicine

## 2015-06-14 ENCOUNTER — Ambulatory Visit: Payer: Medicare HMO | Admitting: Internal Medicine

## 2015-06-21 ENCOUNTER — Telehealth: Payer: Self-pay | Admitting: Internal Medicine

## 2015-06-21 NOTE — Telephone Encounter (Signed)
R/s missed appt...the patient ok and aware

## 2015-07-07 ENCOUNTER — Telehealth: Payer: Self-pay | Admitting: Internal Medicine

## 2015-07-07 NOTE — Telephone Encounter (Signed)
Due to PAL moved 9/20 f/u to 10/24 - lab drawn end of August - August f/u was rescheduled to September. Spoke with patient she is aware. Patient reached on cell.

## 2015-07-13 ENCOUNTER — Ambulatory Visit: Payer: Medicare HMO | Admitting: Internal Medicine

## 2015-07-20 ENCOUNTER — Other Ambulatory Visit: Payer: Self-pay | Admitting: Internal Medicine

## 2015-07-21 ENCOUNTER — Other Ambulatory Visit: Payer: Self-pay | Admitting: Internal Medicine

## 2015-07-27 ENCOUNTER — Telehealth: Payer: Self-pay | Admitting: *Deleted

## 2015-07-27 NOTE — Telephone Encounter (Signed)
FYI Dr. Julien Nordmann,  On 07-26-2015 a voicemail left at 1519 was retrieved at 1704. [employer] Chart retrieval called for reference number [id number] on behalf of [employer].  Returned call to [phone] at this time.  Reached Terri who asked for confirmation to verify provider address, phone number, fax and who to put request to the attention of.  Lead H.I.M. Rhys Martini name provided.

## 2015-07-29 ENCOUNTER — Telehealth: Payer: Self-pay | Admitting: Internal Medicine

## 2015-07-29 NOTE — Telephone Encounter (Signed)
Left message terri @ [employer] [phone] reference number [id number] to return call.

## 2015-08-16 ENCOUNTER — Ambulatory Visit (HOSPITAL_BASED_OUTPATIENT_CLINIC_OR_DEPARTMENT_OTHER): Payer: Medicare HMO | Admitting: Internal Medicine

## 2015-08-16 ENCOUNTER — Encounter: Payer: Self-pay | Admitting: Internal Medicine

## 2015-08-16 VITALS — BP 128/53 | HR 70 | Temp 98.6°F | Resp 18 | Ht 63.0 in | Wt 189.8 lb

## 2015-08-16 DIAGNOSIS — D509 Iron deficiency anemia, unspecified: Secondary | ICD-10-CM | POA: Diagnosis not present

## 2015-08-16 NOTE — Progress Notes (Signed)
[facility] [phone]   [phone] OFFICE PROGRESS NOTE  Lorayne Marek, MD No address on file  DIAGNOSIS: Iron deficiency anemia.  PRIOR THERAPY: None   CURRENT THERAPY: None.  INTERVAL HISTORY: Krystal Moore 81 y.o. female returns to the clinic today for 8 months followup visit. The patient is feeling fine today with no specific complaints. She eats more liver and Kale. She is not taking any iron supplement at this point She denied having any significant fatigue or weakness. She denied having any dizzy spells. She denied having any significant chest pain, shortness of breath, cough or hemoptysis. She denied having any bleeding issues. The patient denied having any significant weight loss or night sweats. She had repeat CBC and iron study performed 2 months ago and she is here for evaluation and discussion of her lab results and treatment options.  MEDICAL HISTORY: Past Medical History  Diagnosis Date  . Polyneuropathy in other diseases classified elsewhere ([place])   . Narcolepsy   . Shoulder fracture, left   . Obesity   . Pneumonia   . Neuropathy ([place])   . Anemia     ALLERGIES:  is allergic to milk-related compounds; poison ivy extract; sulfur; tobacco; and orange juice.  MEDICATIONS:  Current Outpatient Prescriptions  Medication Sig Dispense Refill  . aspirin 81 MG tablet Take 81 mg by mouth daily.    . Cyanocobalamin 5000 MCG CAPS Take 5,000 mcg by mouth daily.    Marland Kitchen FeFum-FePoly-FA-B Cmp-C-Biot (INTEGRA PLUS) CAPS Take 1 capsule by mouth every morning. 30 capsule 3  . gabapentin (NEURONTIN) 100 MG capsule TAKE 1 CAPSULE BY MOUTH THREE TIMES DAILY 90 capsule 0  . levothyroxine (SYNTHROID, LEVOTHROID) 25 MCG tablet TAKE 1 TABLET BY MOUTH DAILY BEFORE BREAKFAST 30 tablet 0  . Multiple Vitamins-Minerals (MULTIVITAMIN PO) Take 1 tablet by mouth daily.     . polyethylene glycol (MIRALAX / GLYCOLAX) packet Take 17 g by mouth  daily.     No current facility-administered medications for this visit.    SURGICAL HISTORY:  Past Surgical History  Procedure Laterality Date  . Nasal sinus surgery    . Mouth surgery      REVIEW OF SYSTEMS:  A comprehensive review of systems was negative.   PHYSICAL EXAMINATION: General appearance: alert, cooperative and no distress Head: Normocephalic, without obvious abnormality, atraumatic Neck: no adenopathy, no JVD, supple, symmetrical, trachea midline and thyroid not enlarged, symmetric, no tenderness/mass/nodules Lymph nodes: Cervical, supraclavicular, and axillary nodes normal. Resp: clear to auscultation bilaterally Back: symmetric, no curvature. ROM normal. No CVA tenderness. Cardio: regular rate and rhythm, S1, S2 normal, no murmur, click, rub or gallop GI: soft, non-tender; bowel sounds normal; no masses,  no organomegaly Extremities: extremities normal, atraumatic, no cyanosis or edema  ECOG PERFORMANCE STATUS: 1 - Symptomatic but completely ambulatory  Blood pressure 128/53, pulse 70, temperature 98.6 F (37 C), temperature source Oral, resp. rate 18, height 5\' 3"  (1.6 m), weight 189 lb 12.8 oz (86.093 kg), SpO2 97 %.  LABORATORY DATA: Lab Results  Component Value Date   WBC 5.1 06/07/2015   HGB 13.5 06/07/2015   HCT 41.0 06/07/2015   MCV 91.9 06/07/2015   PLT 228 06/07/2015      Chemistry      Component Value Date/Time   NA 136* 07/15/2014 1511   K 4.1 07/15/2014 1511   CL 101 07/15/2014 1511   CO2 23 07/15/2014 1511   BUN 15 07/15/2014 1511  CREATININE 0.87 07/15/2014 1511      Component Value Date/Time   CALCIUM 8.9 07/15/2014 1511   ALKPHOS 67 07/27/2013 2006   AST 25 07/27/2013 2006   ALT 15 07/27/2013 2006   BILITOT 0.2* 07/27/2013 2006     Other lab results: Serum ferritin 32, serum iron 114, total iron binding capacity 315 and iron saturation 36%.  RADIOGRAPHIC STUDIES: No results found.  ASSESSMENT AND PLAN: This is a very  pleasant 81 years old white female with iron deficiency anemia previously treated with Integra  Plus 1 capsule by mouth daily and currently on iron rich diet.  CBC, iron study and ferritin performed in August 2016 showed no evidence of anemia or iron deficiency. I recommended for the patient to continue on the iron this diet and to follow-up with her primary care physician has previously scheduled. I don't see a need to see the patient unless she has any concerning symptoms or abnormalities in the future. She was advised to call immediately if she has any concerning symptoms. The patient voices understanding of current disease status and treatment options and is in agreement with the current care plan.  All questions were answered. The patient knows to call the clinic with any problems, questions or concerns. We can certainly see the patient much sooner if necessary.  Disclaimer: This note was dictated with voice recognition software. Similar sounding words can inadvertently be transcribed and may not be corrected upon review.

## 2015-08-18 ENCOUNTER — Other Ambulatory Visit: Payer: Self-pay | Admitting: Internal Medicine

## 2015-08-18 NOTE — Telephone Encounter (Signed)
Nurse called patient, patient verified date of birth. Patient aware of gabapentin and levothyroxine refilled for 1 month only. Patient agrees to make appointment with provider for additional refills.  Nurse transferred patient to front office staff to schedule appointment.

## 2015-09-15 ENCOUNTER — Encounter (HOSPITAL_COMMUNITY): Payer: Self-pay | Admitting: Emergency Medicine

## 2015-09-15 ENCOUNTER — Emergency Department (HOSPITAL_COMMUNITY)
Admission: EM | Admit: 2015-09-15 | Discharge: 2015-09-15 | Disposition: A | Discharge status: Home / Self Care | Payer: Medicare HMO | Attending: Emergency Medicine | Admitting: Emergency Medicine

## 2015-09-15 DIAGNOSIS — Z8781 Personal history of (healed) traumatic fracture: Secondary | ICD-10-CM | POA: Diagnosis not present

## 2015-09-15 DIAGNOSIS — J040 Acute laryngitis: Secondary | ICD-10-CM | POA: Diagnosis not present

## 2015-09-15 DIAGNOSIS — Z8701 Personal history of pneumonia (recurrent): Secondary | ICD-10-CM | POA: Diagnosis not present

## 2015-09-15 DIAGNOSIS — Z7982 Long term (current) use of aspirin: Secondary | ICD-10-CM | POA: Diagnosis not present

## 2015-09-15 DIAGNOSIS — G629 Polyneuropathy, unspecified: Secondary | ICD-10-CM | POA: Diagnosis not present

## 2015-09-15 DIAGNOSIS — Z79899 Other long term (current) drug therapy: Secondary | ICD-10-CM | POA: Insufficient documentation

## 2015-09-15 DIAGNOSIS — J029 Acute pharyngitis, unspecified: Secondary | ICD-10-CM | POA: Diagnosis present

## 2015-09-15 DIAGNOSIS — E669 Obesity, unspecified: Secondary | ICD-10-CM | POA: Insufficient documentation

## 2015-09-15 DIAGNOSIS — D649 Anemia, unspecified: Secondary | ICD-10-CM | POA: Diagnosis not present

## 2015-09-15 LAB — RAPID STREP SCREEN (MED CTR MEBANE ONLY): Streptococcus, Group A Screen (Direct): NEGATIVE

## 2015-09-15 MED ORDER — DEXAMETHASONE SODIUM PHOSPHATE 10 MG/ML IJ SOLN
10.0000 mg | Freq: Once | INTRAMUSCULAR | Status: AC
  Administered 2015-09-15: 10 mg via INTRAMUSCULAR
  Filled 2015-09-15: qty 1

## 2015-09-15 MED ORDER — MAGIC MOUTHWASH
5.0000 mL | Freq: Once | ORAL | Status: AC
  Administered 2015-09-15: 5 mL via ORAL
  Filled 2015-09-15: qty 5

## 2015-09-15 MED ORDER — CHLORHEXIDINE GLUCONATE 0.12 % MT SOLN: 15.0000 mL | Freq: Two times a day (BID) | OROMUCOSAL | Status: DC

## 2015-09-15 MED ORDER — PREDNISONE 20 MG PO TABS: ORAL_TABLET | ORAL | Status: DC

## 2015-09-15 NOTE — Discharge Instructions (Signed)
Laryngitis  Laryngitis is inflammation of your vocal cords. This causes hoarseness, coughing, loss of voice, sore throat, or a dry throat. Your vocal cords are two bands of muscles that are found in your throat. When you speak, these cords come together and vibrate. These vibrations come out through your mouth as sound. When your vocal cords are inflamed, your voice sounds different.  Laryngitis can be temporary (acute) or long-term (chronic). Most cases of acute laryngitis improve with time. Chronic laryngitis is laryngitis that lasts for more than three weeks.  CAUSES  Acute laryngitis may be caused by:   A viral infection.   Lots of talking, yelling, or singing. This is also called vocal strain.   Bacterial infections.  Chronic laryngitis may be caused by:   Vocal strain.   Injury to your vocal cords.   Acid reflux (gastroesophageal reflux disease or GERD).   Allergies.   Sinus infection.   Smoking.   Alcohol abuse.   Breathing in chemicals or dust.   Growths on the vocal cords.  RISK FACTORS  Risk factors for laryngitis include:   Smoking.   Alcohol abuse.   Having allergies.  SIGNS AND SYMPTOMS  Symptoms of laryngitis may include:   Low, hoarse voice.   Loss of voice.   Dry cough.   Sore throat.   Stuffy nose.  DIAGNOSIS  Laryngitis may be diagnosed by:   Physical exam.   Throat culture.   Blood test.   Laryngoscopy. This procedure allows your health care provider to look at your vocal cords with a mirror or viewing tube.  TREATMENT  Treatment for laryngitis depends on what is causing it. Usually, treatment involves resting your voice and using medicines to soothe your throat. However, if your laryngitis is caused by a bacterial infection, you may need to take antibiotic medicine. If your laryngitis is caused by a growth, you may need to have a procedure to remove it.  HOME CARE INSTRUCTIONS   Drink enough fluid to keep your urine clear or pale yellow.   Breathe in moist air. Use a  humidifier if you live in a dry climate.   Take medicines only as directed by your health care provider.   If you were prescribed an antibiotic medicine, finish it all even if you start to feel better.   Do not smoke cigarettes or electronic cigarettes. If you need help quitting, ask your health care provider.   Talk as little as possible. Also avoid whispering, which can cause vocal strain.   Write instead of talking. Do this until your voice is back to normal.  SEEK MEDICAL CARE IF:   You have a fever.   You have increasing pain.   You have difficulty swallowing.  SEEK IMMEDIATE MEDICAL CARE IF:   You cough up blood.   You have trouble breathing.     This information is not intended to replace advice given to you by your health care provider. Make sure you discuss any questions you have with your health care provider.     Document Released: 10/09/2005 Document Revised: 10/30/2014 Document Reviewed: 03/24/2014  Elsevier Interactive Patient Education 2016 [employer]

## 2015-09-15 NOTE — ED Provider Notes (Signed)
Medical screening examination/treatment/procedure(s) were conducted as a shared visit with non-physician practitioner(s) and myself.  I personally evaluated the patient during the encounter.   EKG Interpretation None     Patient here complaining of sore and hoarse throat times several days. No fever or chills. Rapid strep negative. Likely allergic in stable for discharge  Lacretia Leigh, MD 09/15/15 1505

## 2015-09-15 NOTE — ED Provider Notes (Signed)
CSN: [id number]     Arrival date & time 09/15/15  1324 History   First MD Initiated Contact with Patient 09/15/15 1353     Chief Complaint  Patient presents with  . Sore Throat     (Consider location/radiation/quality/duration/timing/severity/associated sxs/prior Treatment) HPI   81 year old female with history of narcolepsy, neuropathy presenting today with complaints of sore throat. Patient reports for the past 4 days she has had throat irritation which has gotten progressively worse. Endorse mild nasal congestion with the symptoms. Yesterday she lost her voice.  Despite taking: Sinus over-the-counter medication for the past 3 days she has noticed no improvement. No complaints of fever, chills, headache, sneezing, productive cough, ear pain, chest pain, difficulty breathing, neck pain, or rash. 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 No history of active cancer.  Past Medical History  Diagnosis Date  . Polyneuropathy in other diseases classified elsewhere ([place])   . Narcolepsy   . Shoulder fracture, left   . Obesity   . Pneumonia   . Neuropathy ([place])   . Anemia    Past Surgical History  Procedure Laterality Date  . Nasal sinus surgery    . Mouth surgery     Family History  Problem Relation Age of Onset  . Stroke Mother     questionable  . Heart attack Father   . Clotting disorder Mother    Social History  Substance Use Topics  . Smoking status: Never Smoker   . Smokeless tobacco: Never Used  . Alcohol Use: Yes     Comment: 1/4 glass per day   OB History    No data available     Review of Systems  All other systems reviewed and are negative.     Allergies  Milk-related compounds; Poison ivy extract; Sulfur; Tobacco; and Orange juice  Home Medications   Prior to Admission medications   Medication Sig Start Date End Date Taking?  Authorizing Provider  aspirin 81 MG tablet Take 81 mg by mouth daily.   Yes Historical Provider, MD  Cyanocobalamin 5000 MCG CAPS Take 5,000 mcg by mouth daily.   Yes Historical Provider, MD  gabapentin (NEURONTIN) 100 MG capsule TAKE 1 CAPSULE BY MOUTH THREE TIMES DAILY 03/12/15  Yes Deepak Advani, MD  levothyroxine (SYNTHROID, LEVOTHROID) 25 MCG tablet TAKE 1 TABLET BY MOUTH DAILY BEFORE BREAKFAST 08/18/15  Yes Josalyn Funches, MD  LUTEIN PO Take 1 tablet by mouth daily.   Yes Historical Provider, MD  Multiple Vitamins-Minerals (MULTIVITAMIN PO) Take 1 tablet by mouth daily.    Yes Historical Provider, MD  Multiple Vitamins-Minerals (ZINC PO) Take 1 tablet by mouth daily.   Yes Historical Provider, MD  OVER THE COUNTER MEDICATION Take 1 tablet by mouth daily as needed (cold symtpoms). otc cold and congestion tablets   Yes Historical Provider, MD  sodium chloride (OCEAN) 0.65 % SOLN nasal spray Place 1 spray into both nostrils as needed for congestion.   Yes Historical Provider, MD  FeFum-FePoly-FA-B Cmp-C-Biot (INTEGRA PLUS) CAPS Take 1 capsule by mouth every morning. 08/19/14   Curt Bears, MD   BP 144/58 mmHg  Pulse 62  Temp(Src) 98.1 F (36.7 C) (Oral)  Resp 18  SpO2 100% Physical Exam  Constitutional: She appears well-developed and well-nourished. No distress.  Caucasian female, appears stated age, in no acute distress.  Patient is hoarse  HENT:  Head: Atraumatic.  Ears: Normal right TM.  cerumen impaction in left ear canal. Nose: Normal nares, normal turbinate Throat: Uvula is midline no tonsillar enlargement or exudates. Mild posterior oropharyngeal erythema. No trismus.  Eyes: Conjunctivae are normal.  Neck: Neck supple.  No nuchal rigidity  Cardiovascular: Normal rate and regular rhythm.   Pulmonary/Chest: Effort normal and breath sounds normal.  Lymphadenopathy:    She has no cervical adenopathy.  Neurological: She is alert.  Skin: No rash noted.  Psychiatric: She  has a normal mood and affect.  Nursing note and vitals reviewed.   ED Course  Procedures (including critical care time)  Patient presents with sore throat, and hoarseness. Throat exam is unremarkable, strep test obtained. She is afebrile with stable normal vital sign no evidence of deep tissue infection on exam. No airway compromise. Magic mouthwash given to patient along with shot of Decadron.  2:45 PM Strep test is negative.  Throat culture sent.  Pt able to tolerates PO.  Will discharge with a course of steroid, peridex and guaifenesin.  Outpt f/u recommended.  Return precaution discussed.  Care discussed with Dr. Zenia Resides.   2:59 PM Pt felt better after treatment.  Pt stable for discharge.    Labs Review Labs Reviewed  RAPID STREP SCREEN (NOT AT [facility])  CULTURE, GROUP A STREP    Imaging Review No results found. I have personally reviewed and evaluated these images and lab results as part of my medical decision-making.   EKG Interpretation None      MDM   Final diagnoses:  Laryngitis without obstruction, acute    BP 144/58 mmHg  Pulse 62  Temp(Src) 98.1 F (36.7 C) (Oral)  Resp 18  SpO2 100%     Domenic Moras, PA-C 09/15/15 1503  Lacretia Leigh, MD 09/16/15 [phone]

## 2015-09-15 NOTE — ED Notes (Signed)
Per pt, states started having sore throat on Sunday-thought it was allergies-painful swallowing that started today

## 2015-09-17 LAB — CULTURE, GROUP A STREP: Strep A Culture: NEGATIVE

## 2015-09-20 ENCOUNTER — Telehealth: Payer: Self-pay | Admitting: Family Medicine

## 2015-09-20 DIAGNOSIS — G6289 Other specified polyneuropathies: Secondary | ICD-10-CM

## 2015-09-20 DIAGNOSIS — E039 Hypothyroidism, unspecified: Secondary | ICD-10-CM

## 2015-09-20 NOTE — Telephone Encounter (Signed)
Pt notified  Call transfer to front office for appointments

## 2015-09-20 NOTE — Telephone Encounter (Signed)
Nurse called patient, reached voicemail. Left message for patient to call Railynn Ballo with [facility], at [phone]. Nurse called patient to make her aware of need for an appointment. Nurse received prescription refill requests for gabapentin and levothyroxine.  Refill request sent to provider.

## 2015-09-20 NOTE — Telephone Encounter (Signed)
meds refilled Please inform patient Also ask patient to come in for lab draw, TSH,  and to schedule f/u appt

## 2015-10-01 ENCOUNTER — Encounter (HOSPITAL_COMMUNITY): Payer: Self-pay | Admitting: Emergency Medicine

## 2015-10-01 ENCOUNTER — Emergency Department (INDEPENDENT_AMBULATORY_CARE_PROVIDER_SITE_OTHER)
Admission: EM | Admit: 2015-10-01 | Discharge: 2015-10-01 | Disposition: A | Discharge status: Home / Self Care | Payer: Medicare HMO | Source: Home / Self Care | Attending: Family Medicine | Admitting: Family Medicine

## 2015-10-01 DIAGNOSIS — H9191 Unspecified hearing loss, right ear: Secondary | ICD-10-CM | POA: Diagnosis not present

## 2015-10-01 DIAGNOSIS — H6122 Impacted cerumen, left ear: Secondary | ICD-10-CM

## 2015-10-01 MED ORDER — IPRATROPIUM BROMIDE 0.06 % NA SOLN: 2.0000 | Freq: Four times a day (QID) | NASAL | Status: DC

## 2015-10-01 NOTE — ED Provider Notes (Signed)
CSN: [id number]     Arrival date & time 10/01/15  1451 History   First MD Initiated Contact with Patient 10/01/15 1607     Chief Complaint  Patient presents with  . Otalgia   (Consider location/radiation/quality/duration/timing/severity/associated sxs/prior Treatment) Patient is a 81 y.o. female presenting with ear pain. The history is provided by the patient.  Otalgia Location:  Bilateral Behind ear:  No abnormality Quality:  Pressure Severity:  Mild Onset quality:  Gradual Duration:  10 days (seen 11/23 in ER ) Progression:  Unchanged Chronicity:  New Relieved by:  Nothing Associated symptoms: congestion   Associated symptoms: no ear discharge and no fever     Past Medical History  Diagnosis Date  . Polyneuropathy in other diseases classified elsewhere ([place])   . Narcolepsy   . Shoulder fracture, left   . Obesity   . Pneumonia   . Neuropathy ([place])   . Anemia    Past Surgical History  Procedure Laterality Date  . Nasal sinus surgery    . Mouth surgery     Family History  Problem Relation Age of Onset  . Stroke Mother     questionable  . Heart attack Father   . Clotting disorder Mother    Social History  Substance Use Topics  . Smoking status: Never Smoker   . Smokeless tobacco: Never Used  . Alcohol Use: Yes     Comment: 1/4 glass per day   OB History    No data available     Review of Systems  Constitutional: Negative for fever.  HENT: Positive for congestion and ear pain. Negative for ear discharge and postnasal drip.   All other systems reviewed and are negative.   Allergies  Milk-related compounds; Poison ivy extract; Sulfur; Tobacco; and Orange juice  Home Medications   Prior to Admission medications   Medication Sig Start Date End Date Taking? Authorizing Provider  aspirin 81 MG tablet Take 81 mg by mouth daily.    Historical Provider, MD  chlorhexidine (PERIDEX) 0.12 % solution Use as directed 15 mLs in the mouth or throat 2 (two) times  daily. 09/15/15   Domenic Moras, PA-C  Cyanocobalamin 5000 MCG CAPS Take 5,000 mcg by mouth daily.    Historical Provider, MD  FeFum-FePoly-FA-B Cmp-C-Biot (INTEGRA PLUS) CAPS Take 1 capsule by mouth every morning. 08/19/14   Curt Bears, MD  gabapentin (NEURONTIN) 100 MG capsule TAKE 1 CAPSULE BY MOUTH THREE TIMES DAILY 09/20/15   Josalyn Funches, MD  ipratropium (ATROVENT) 0.06 % nasal spray Place 2 sprays into both nostrils 4 (four) times daily. 10/01/15   Billy Fischer, MD  levothyroxine (SYNTHROID, LEVOTHROID) 25 MCG tablet TAKE 1 TABLET BY MOUTH ONCE DAILY BEFORE BREAKFAST 09/20/15   Josalyn Funches, MD  LUTEIN PO Take 1 tablet by mouth daily.    Historical Provider, MD  Multiple Vitamins-Minerals (MULTIVITAMIN PO) Take 1 tablet by mouth daily.     Historical Provider, MD  Multiple Vitamins-Minerals (ZINC PO) Take 1 tablet by mouth daily.    Historical Provider, MD  OVER THE COUNTER MEDICATION Take 1 tablet by mouth daily as needed (cold symtpoms). otc cold and congestion tablets    Historical Provider, MD  sodium chloride (OCEAN) 0.65 % SOLN nasal spray Place 1 spray into both nostrils as needed for congestion.    Historical Provider, MD   Meds Ordered and Administered this Visit  Medications - No data to display  BP 156/91 mmHg  Pulse 67  Temp(Src) 98.4 F (  36.9 C) (Oral)  Resp 16  SpO2 98% No data found.   Physical Exam  Constitutional: She is oriented to person, place, and time. She appears well-developed and well-nourished. She appears distressed.  HENT:  Right Ear: Tympanic membrane is erythematous.  Ears:  Mouth/Throat: Oropharynx is clear and moist.  Eyes: Pupils are equal, round, and reactive to light.  Neck: Normal range of motion. Neck supple.  Lymphadenopathy:    She has no cervical adenopathy.  Neurological: She is alert and oriented to person, place, and time.  Skin: Skin is warm and dry.  Nursing note and vitals reviewed.   ED Course  Procedures  (including critical care time)  Labs Review Labs Reviewed - No data to display  Imaging Review No results found.   Visual Acuity Review  Right Eye Distance:   Left Eye Distance:   Bilateral Distance:    Right Eye Near:   Left Eye Near:    Bilateral Near:         MDM   1. Cerumen impaction, left   2. Hearing deficit, right    Pt very happy and hearing improved with left ear irrig, still with right hearing deficit. Will send to ENT.    Billy Fischer, MD 10/01/15 [phone]

## 2015-10-01 NOTE — ED Notes (Addendum)
11/23 patient was seen in the ed for ear problems.  Patient unable to follow up as instructed.  Patient here today for continued pain and inability to hear in  ear

## 2015-10-01 NOTE — Discharge Instructions (Signed)
Use medicine as prescribed and see ent doctor if hearing problems continue.

## 2015-10-06 ENCOUNTER — Ambulatory Visit: Payer: Medicare HMO | Attending: Family Medicine

## 2015-10-06 DIAGNOSIS — E039 Hypothyroidism, unspecified: Secondary | ICD-10-CM

## 2015-10-06 LAB — TSH: TSH: 7.192 u[IU]/mL — ABNORMAL HIGH (ref 0.350–4.500)

## 2015-10-07 ENCOUNTER — Other Ambulatory Visit: Payer: Self-pay | Admitting: Family Medicine

## 2015-10-07 DIAGNOSIS — E039 Hypothyroidism, unspecified: Secondary | ICD-10-CM

## 2015-10-07 MED ORDER — LEVOTHYROXINE SODIUM 50 MCG PO TABS: 50.0000 ug | ORAL_TABLET | Freq: Every day | ORAL | Status: DC

## 2015-10-07 NOTE — Assessment & Plan Note (Signed)
TSH high which means synthroid dose is too low Increase synthroid to 50 mcg from 25 mcg daily

## 2015-10-11 ENCOUNTER — Encounter: Payer: Self-pay | Admitting: Family Medicine

## 2015-10-11 ENCOUNTER — Ambulatory Visit: Payer: Medicare HMO | Attending: Family Medicine | Admitting: Family Medicine

## 2015-10-11 VITALS — BP 127/79 | HR 75 | Temp 98.7°F | Resp 16 | Ht 63.0 in | Wt 189.0 lb

## 2015-10-11 DIAGNOSIS — E039 Hypothyroidism, unspecified: Secondary | ICD-10-CM | POA: Insufficient documentation

## 2015-10-11 DIAGNOSIS — G629 Polyneuropathy, unspecified: Secondary | ICD-10-CM | POA: Insufficient documentation

## 2015-10-11 DIAGNOSIS — G6289 Other specified polyneuropathies: Secondary | ICD-10-CM | POA: Insufficient documentation

## 2015-10-11 DIAGNOSIS — Z0000 Encounter for general adult medical examination without abnormal findings: Secondary | ICD-10-CM | POA: Diagnosis not present

## 2015-10-11 DIAGNOSIS — Z23 Encounter for immunization: Secondary | ICD-10-CM

## 2015-10-11 DIAGNOSIS — J029 Acute pharyngitis, unspecified: Secondary | ICD-10-CM | POA: Insufficient documentation

## 2015-10-11 DIAGNOSIS — J3089 Other allergic rhinitis: Secondary | ICD-10-CM | POA: Diagnosis not present

## 2015-10-11 DIAGNOSIS — Z79899 Other long term (current) drug therapy: Secondary | ICD-10-CM | POA: Diagnosis not present

## 2015-10-11 DIAGNOSIS — Z7982 Long term (current) use of aspirin: Secondary | ICD-10-CM | POA: Insufficient documentation

## 2015-10-11 DIAGNOSIS — J309 Allergic rhinitis, unspecified: Secondary | ICD-10-CM | POA: Insufficient documentation

## 2015-10-11 MED ORDER — GABAPENTIN 100 MG PO CAPS: 100.0000 mg | ORAL_CAPSULE | Freq: Three times a day (TID) | ORAL | Status: DC

## 2015-10-11 MED ORDER — CYANOCOBALAMIN 5000 MCG PO CAPS: 5000.0000 ug | ORAL_CAPSULE | Freq: Every day | ORAL | Status: DC

## 2015-10-11 MED ORDER — IPRATROPIUM BROMIDE 0.06 % NA SOLN: 2.0000 | Freq: Four times a day (QID) | NASAL | Status: DC

## 2015-10-11 NOTE — Assessment & Plan Note (Signed)
A: elevated TSH P: Synthroid dose has been changed to 50 mcg Repeat TSH in 12 weeks

## 2015-10-11 NOTE — Progress Notes (Signed)
ED F/U allergic reaction  Stated Ear feeling stuffy  Unable to hear from Rt ear  No pain now  No tobacco user  No suicidal thoughts in the past two weeks

## 2015-10-11 NOTE — Patient Instructions (Addendum)
Krystal Moore was seen today for ear fullness.  Diagnoses and all orders for this visit:  Other allergic rhinitis -     ipratropium (ATROVENT) 0.06 % nasal spray; Place 2 sprays into both nostrils 4 (four) times daily.  Other polyneuropathy ([place]) -     Cyanocobalamin 5000 MCG CAPS; Take 5,000 mcg by mouth daily. Reported on 10/11/2015 -     gabapentin (NEURONTIN) 100 MG capsule; Take 1 capsule (100 mg total) by mouth 3 (three) times daily.  Hypothyroidism, unspecified hypothyroidism type  continue synthroid 50 mcg daily  F/u in 3 months for TSH check  Dr. Adrian Blackwater

## 2015-10-11 NOTE — Progress Notes (Signed)
Subjective:  Patient ID: Krystal Moore, female    DOB: Feb 22, 1930  Age: 81 y.o. MRN: [id number]  CC: Ear Fullness   HPI Krystal Moore presents for    1. Ear pain: patient with recent allergic reaction to mold. She developed b/l ear pain, worse on R. Decreased hearing. Hoarseness and sore throat. Her hoarseness and sore throat has improved with UC treatment. She continue to take atrovent nasal spray for ear pressure. No dizziness or lightheadedness. She has decreased hearing in her R ear that is slowly improving.   2. Hypothyroidism: recent TSH was high. Her synthroid dose has been increased to 50 mcg daily. She is aware of the increase. She reports weight gain and slight swelling in ankles.   3. Peripheral neuropathy: patient pain free with vit B12 and low dose gabapentin. 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  Social History  Substance Use Topics  . Smoking status: Never Smoker   . Smokeless tobacco: Never Used  . Alcohol Use: Yes     Comment: 1/4 glass per day    Outpatient Prescriptions Prior to Visit  Medication Sig Dispense Refill  . aspirin 81 MG tablet Take 81 mg by mouth daily.    Marland Kitchen gabapentin (NEURONTIN) 100 MG capsule TAKE 1 CAPSULE BY MOUTH THREE TIMES DAILY 90 capsule 0  . levothyroxine (SYNTHROID, LEVOTHROID) 50 MCG tablet Take 1 tablet (50 mcg total) by mouth daily before breakfast. 30 tablet 2  . Multiple Vitamins-Minerals (MULTIVITAMIN PO) Take 1 tablet by mouth daily.     . Multiple Vitamins-Minerals (ZINC PO) Take 1 tablet by mouth daily. Reported on 10/11/2015    . OVER THE COUNTER MEDICATION Take 1 tablet by mouth daily as needed (cold symtpoms). Reported on 10/11/2015    . chlorhexidine (PERIDEX) 0.12 % solution Use as directed 15 mLs in the mouth or throat 2 (two) times daily. 120 mL 0  . ipratropium (ATROVENT) 0.06 % nasal spray Place 2 sprays into both nostrils 4 (four) times daily. 15 mL 1  . Cyanocobalamin 5000 MCG  CAPS Take 5,000 mcg by mouth daily. Reported on 10/11/2015    . LUTEIN PO Take 1 tablet by mouth daily.    . sodium chloride (OCEAN) 0.65 % SOLN nasal spray Place 1 spray into both nostrils as needed for congestion. Reported on 10/11/2015    . FeFum-FePoly-FA-B Cmp-C-Biot (INTEGRA PLUS) CAPS Take 1 capsule by mouth every morning. (Patient not taking: Reported on 10/11/2015) 30 capsule 3   No facility-administered medications prior to visit.    ROS Review of Systems  Constitutional: Negative for fever and chills.  HENT: Positive for hearing loss (R ear ).   Eyes: Negative for visual disturbance.  Respiratory: Negative for shortness of breath.   Cardiovascular: Positive for leg swelling. Negative for chest pain.  Gastrointestinal: Negative for abdominal pain and blood in stool.  Musculoskeletal: Negative for back pain and arthralgias.  Skin: Negative for rash.  Allergic/Immunologic: Negative for immunocompromised state.  Hematological: Negative for adenopathy. Does not bruise/bleed easily.  Psychiatric/Behavioral: Negative for suicidal ideas and dysphoric mood.    Objective:  BP 127/79 mmHg  Pulse 75  Temp(Src) 98.7 F (37.1 C) (Oral)  Resp 16  Ht 5\' 3"  (1.6 m)  Wt 189 lb (85.73 kg)  BMI 33.49 kg/m2  SpO2 96%  BP/Weight 10/11/2015 10/01/2015 [id number]  Systolic BP [id number]  Diastolic BP 79 91 72  Wt. (Lbs) 189 - -  BMI 33.49 - -  Physical Exam  Constitutional: She is oriented to person, place, and time. She appears well-developed and well-nourished. No distress.  HENT:  Head: Normocephalic and atraumatic.  Cardiovascular: Normal rate, regular rhythm, normal heart sounds and intact distal pulses.   Pulmonary/Chest: Effort normal and breath sounds normal.  Musculoskeletal: She exhibits edema (trace foot and ankle swelling ).  Neurological: She is alert and oriented to person, place, and time.  Skin: Skin is warm and dry. No rash noted.  Psychiatric: She has a normal  mood and affect.   Assessment & Plan:   Problem List Items Addressed This Visit    Allergic rhinitis - Primary    A: recent allergic rhinitis and pharyngitis. Improving P: Continue atrovent       Relevant Medications   ipratropium (ATROVENT) 0.06 % nasal spray   Hypothyroidism (Chronic)    A: elevated TSH P: Synthroid dose has been changed to 50 mcg Repeat TSH in 12 weeks       Peripheral neuropathy ([facility]) (Chronic)    A; controlled P: Continue vit B12 and gabapentin       Relevant Medications   Cyanocobalamin 5000 MCG CAPS   gabapentin (NEURONTIN) 100 MG capsule    Other Visit Diagnoses    Healthcare maintenance        Relevant Orders    Flu Vaccine QUAD 36+ mos IM (Completed)       No orders of the defined types were placed in this encounter.    Follow-up: No Follow-up on file.   Boykin Nearing MD

## 2015-10-11 NOTE — Assessment & Plan Note (Signed)
A; controlled P: Continue vit B12 and gabapentin

## 2015-10-11 NOTE — Assessment & Plan Note (Signed)
A: recent allergic rhinitis and pharyngitis. Improving P: Continue atrovent

## 2015-10-25 ENCOUNTER — Other Ambulatory Visit: Payer: Self-pay | Admitting: Family Medicine

## 2015-10-26 ENCOUNTER — Other Ambulatory Visit: Payer: Self-pay | Admitting: Family Medicine

## 2016-01-08 ENCOUNTER — Other Ambulatory Visit: Payer: Self-pay | Admitting: Family Medicine

## 2016-02-09 ENCOUNTER — Other Ambulatory Visit: Payer: Self-pay | Admitting: Family Medicine

## 2016-02-24 IMAGING — CT CT HEAD W/O CM
2 series · 16 of 30 positions shown, 20 images · non-contrast
Comparison: None

CLINICAL DATA: Dizziness for 1 day, no known injury

EXAM:
CT HEAD WITHOUT CONTRAST
TECHNIQUE: Contiguous axial images were obtained from the base of the skull
through the vertex without intravenous contrast.

[Series 2: head w/o · axial · non-contrast · 0.45mm/px · z∈[+1385,+1520]mm · 13 of 33 slices shown, 17 images]
[im 3/33  brain]
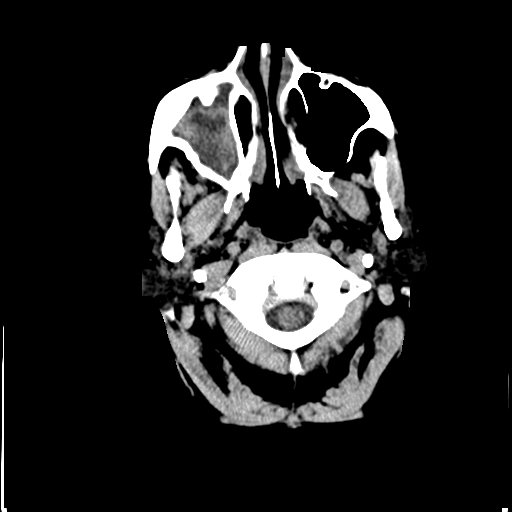
[im 3/33  bone]
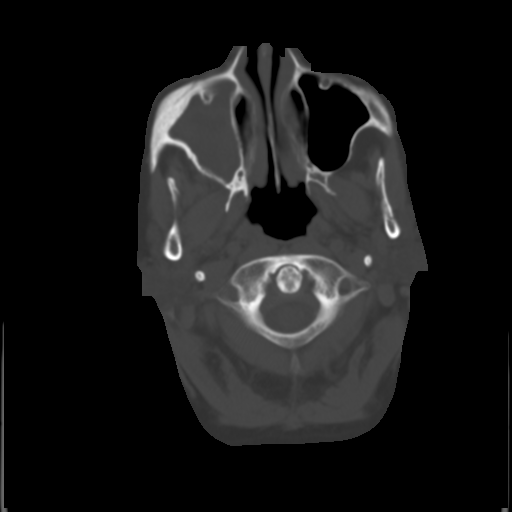
[im 5/33  brain]
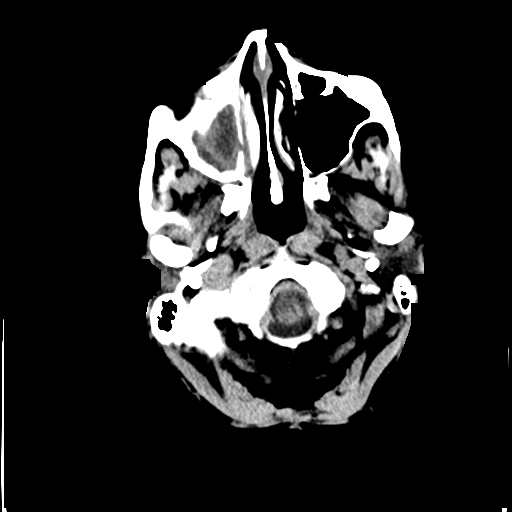
[im 7/33  brain]
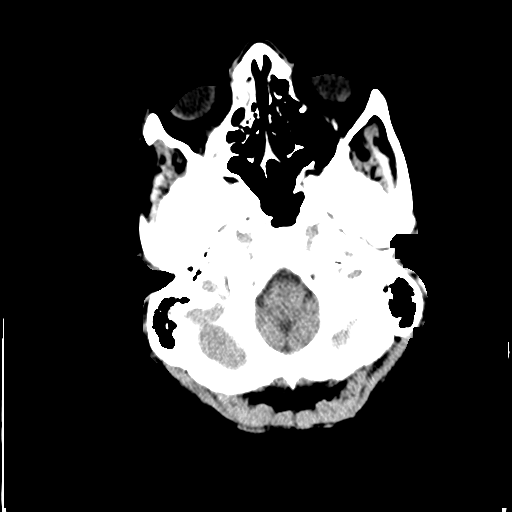
[im 10/33  brain]
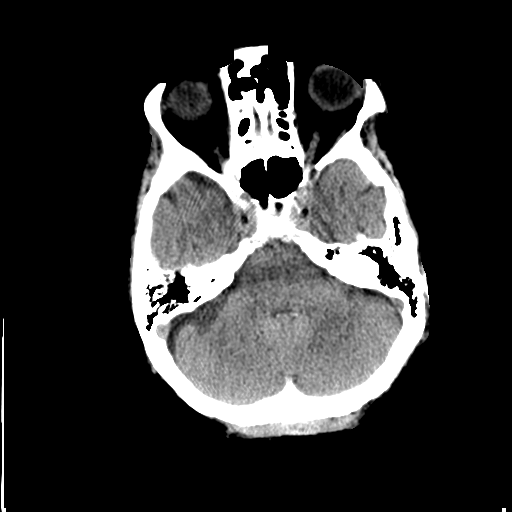
[im 12/33  brain]
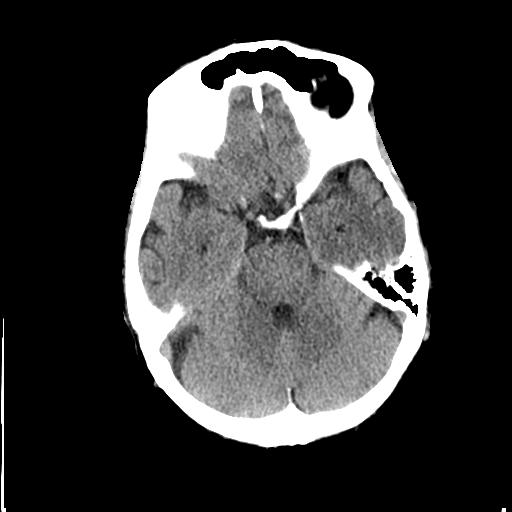
[im 12/33  bone]
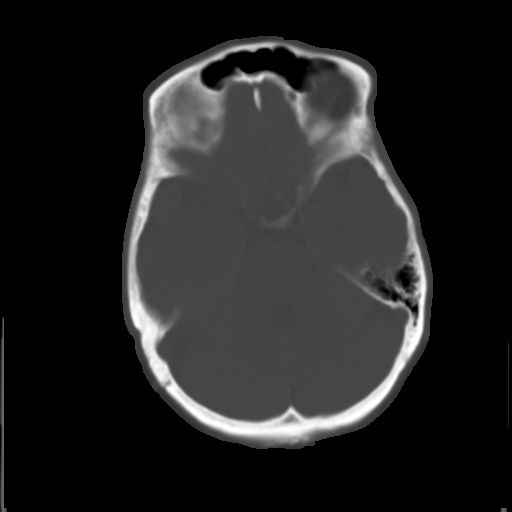
[im 14/33  brain]
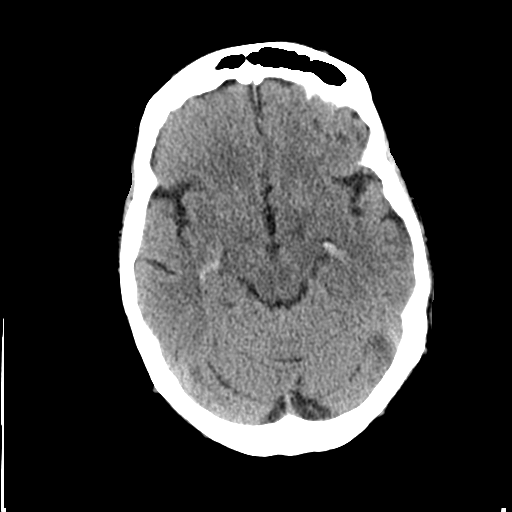
[im 17/33  brain]
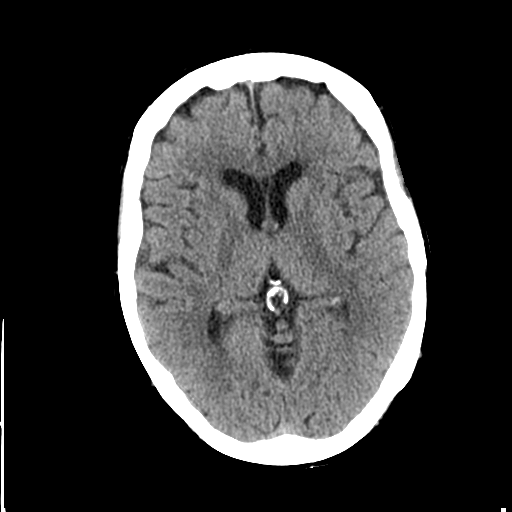
[im 19/33  brain]
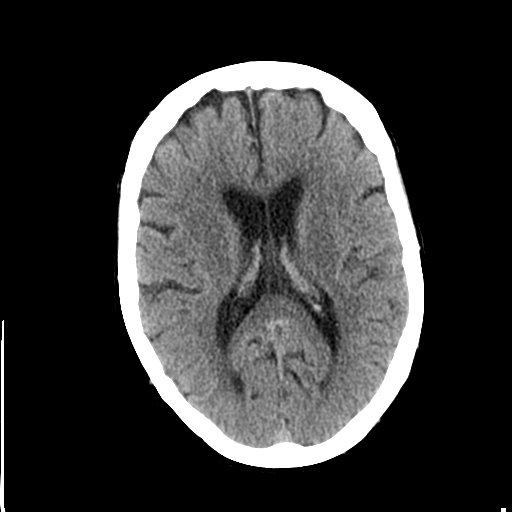
[im 21/33  brain]
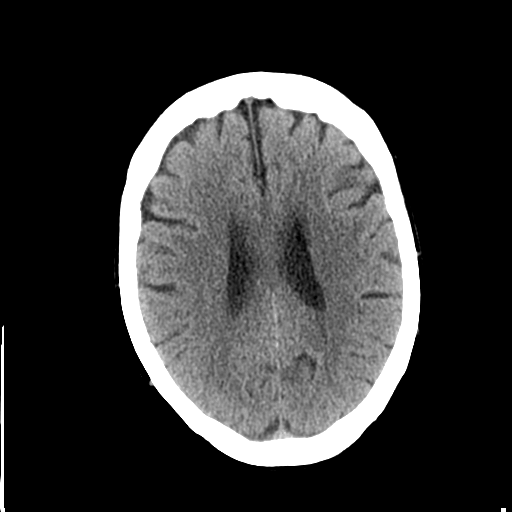
[im 21/33  bone]
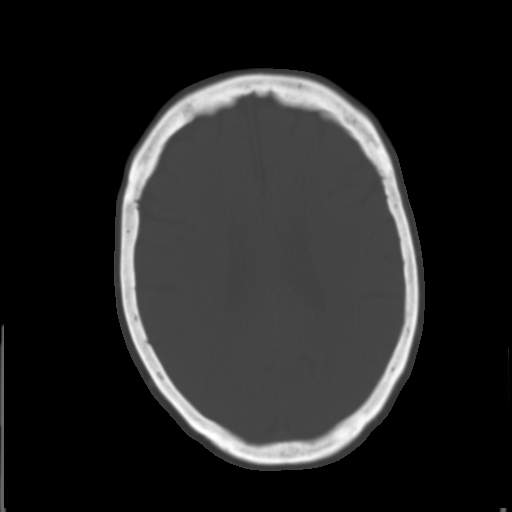
[im 23/33  brain]
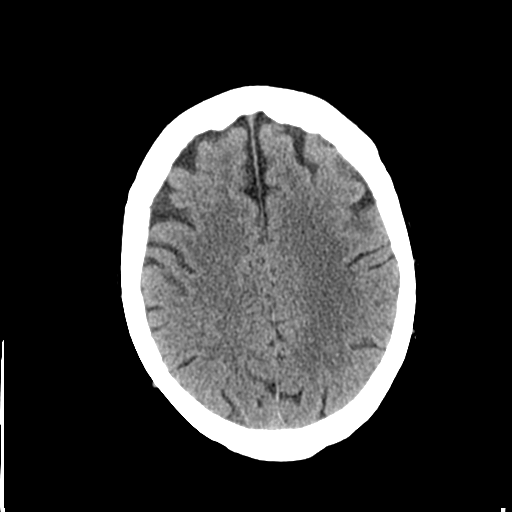
[im 26/33  brain]
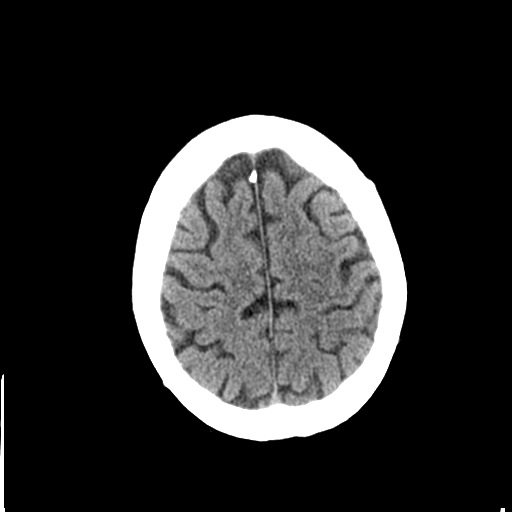
[im 28/33  brain]
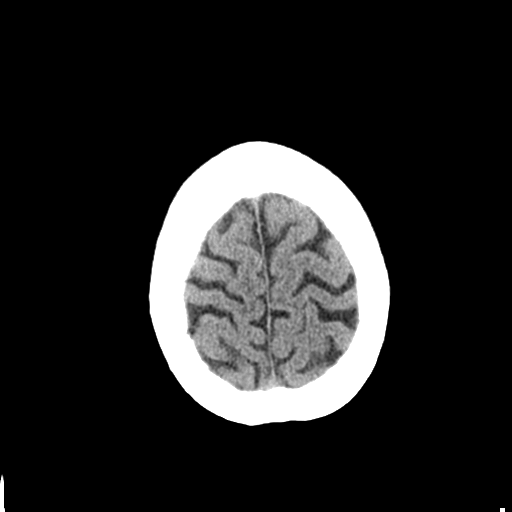
[im 30/33  brain]
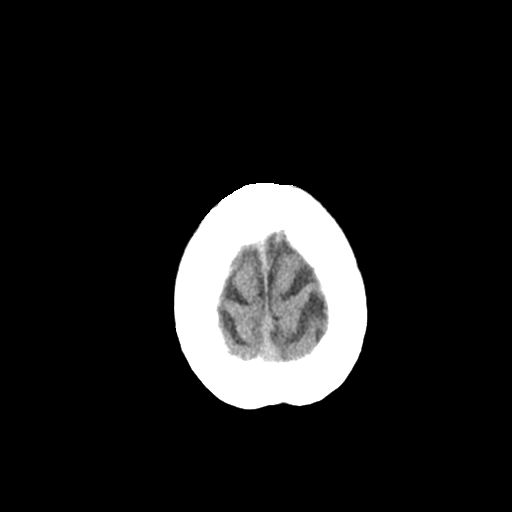
[im 30/33  bone]
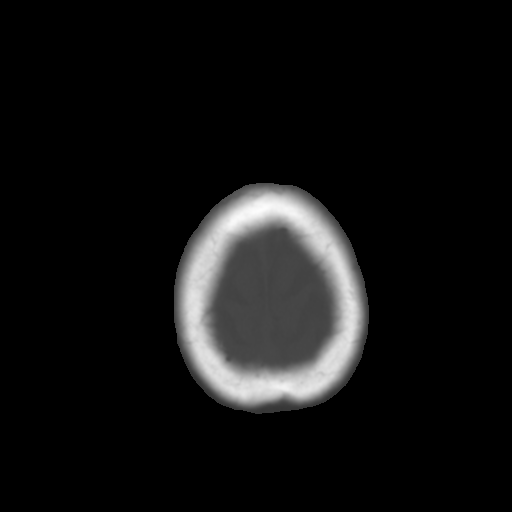

[Series 3: bone windows · axial · 0.45mm/px · z∈[+1385,+1430]mm · 3 of 33 slices shown]
[im 3/33  bone]
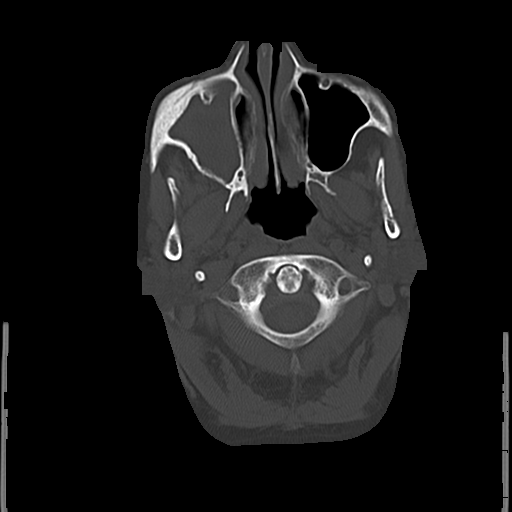
[im 7/33  bone]
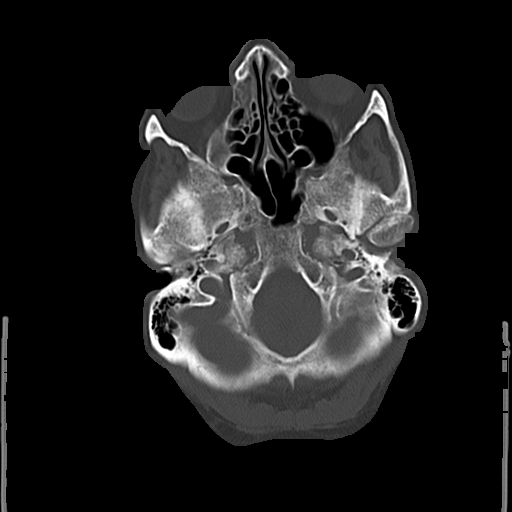
[im 12/33  bone]
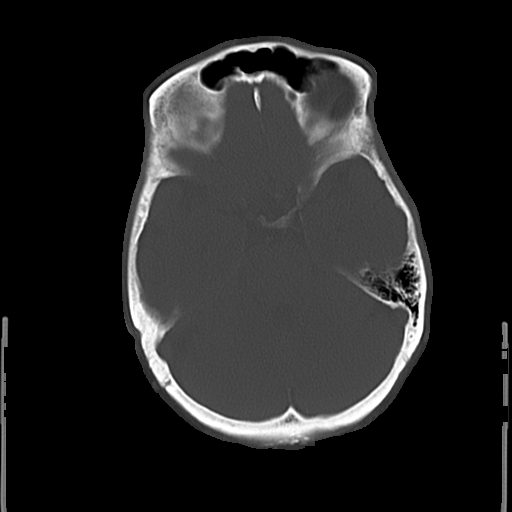

[16 of 30 positions shown; findings below may reference images not displayed]

FINDINGS: Generalized atrophy.

Normal ventricular morphology.

No midline shift or mass effect.

No intracranial hemorrhage, mass lesion, or evidence acute
infarction.

No extra-axial fluid collections.

Opacification of RIGHT maxillary sinus with osseous wall thickening
suggesting chronic sinusitis.

Bones otherwise demineralized.

Remaining sinuses and mastoid air cells clear.
IMPRESSION: Generalized atrophy.

No acute intracranial abnormalities.

Question chronic RIGHT maxillary sinusitis.

## 2016-02-24 IMAGING — CR DG CHEST 2V
2 series · 2 of 2 positions shown · non-contrast
Comparison: Chest x-ray 02/08/2009 CT abdomen pelvis 03/22/2010

CLINICAL DATA: 83-year-old female with low blood pressure and
dizziness.

EXAM:
CHEST - 2 VIEW

[w chest pa]
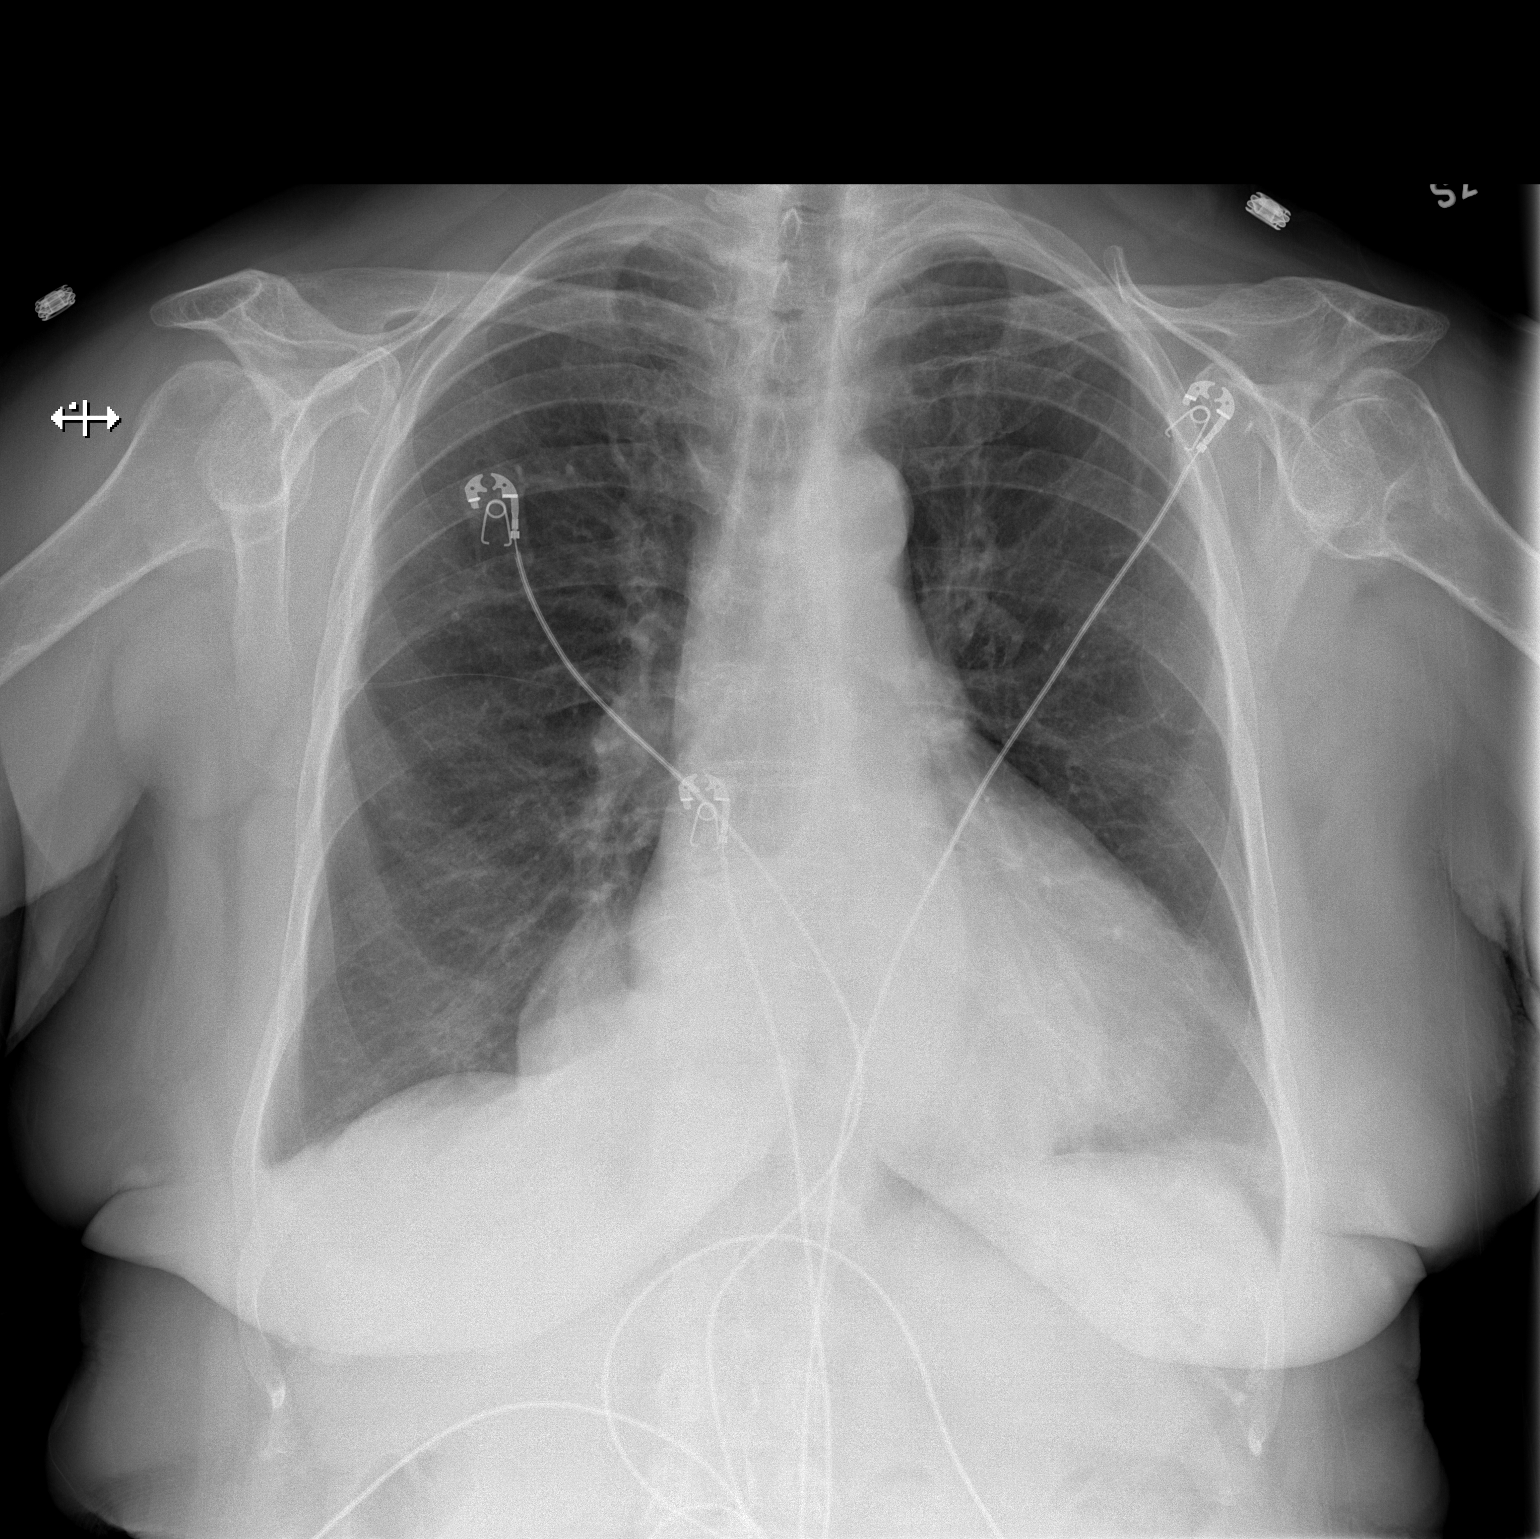

[w chest lat]
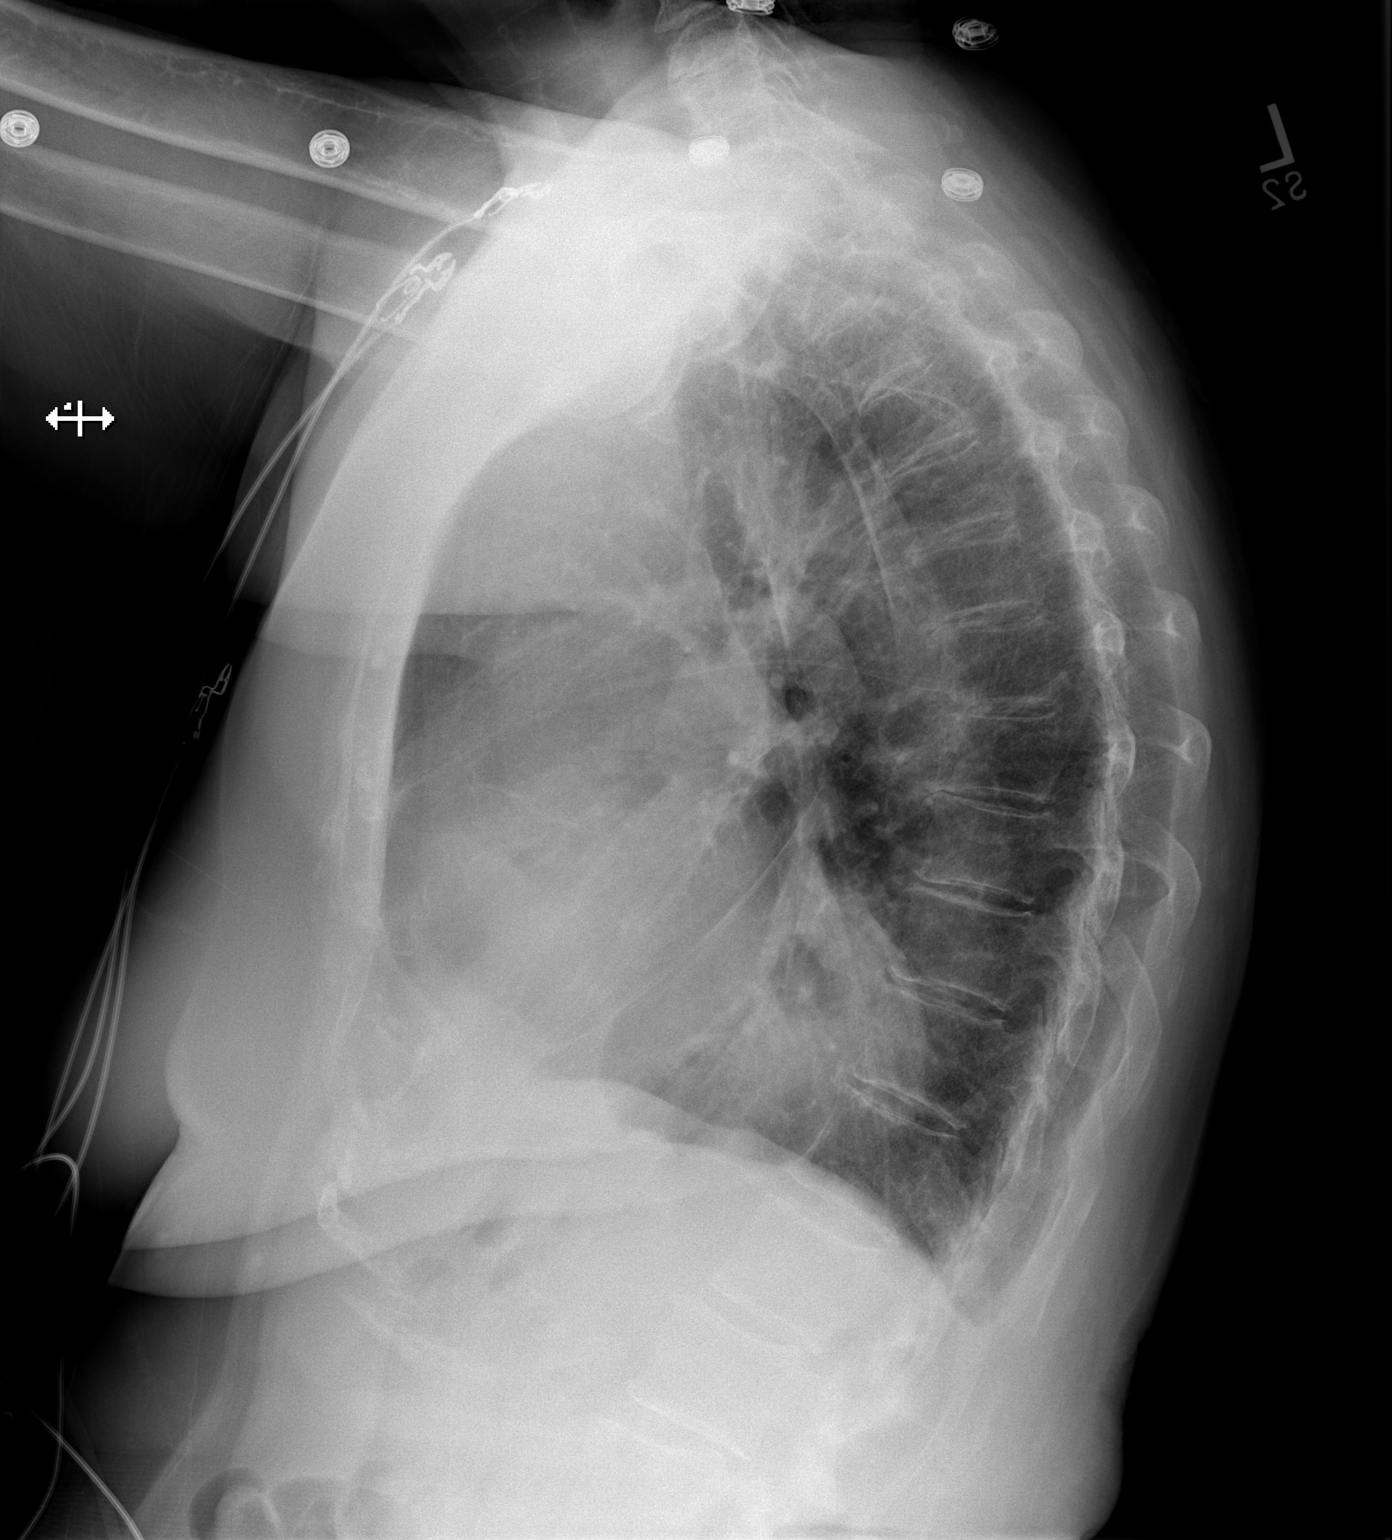

[2 of 2 positions shown; findings below may reference images not displayed]

FINDINGS: Cardiac size borderline enlarged, similar to prior.

Density overlying the mediastinum just above the diaphragm is
present on both anterior and lateral views, compatible with large
hiatal hernia present on comparison abdominal CT.

No evidence of pulmonary vascular congestion.

No confluent airspace disease.  No pneumothorax.  No pleural fluid.

Atherosclerotic changes of the aortic arch.

No displaced fracture. Posttraumatic changes of the left humeral
head. This was present on upper extremity CT 05/07/2009. Multilevel
degenerative disc disease of the visualized spine.
IMPRESSION: No radiographic evidence of acute cardiopulmonary disease.

Large hiatal hernia, which was present at the time of abdominal C5
11/21/2009.

Atherosclerosis

Posttraumatic changes of the left humeral head.

## 2016-03-08 ENCOUNTER — Other Ambulatory Visit: Payer: Self-pay | Admitting: Family Medicine

## 2016-03-17 ENCOUNTER — Other Ambulatory Visit: Payer: Self-pay | Admitting: Family Medicine

## 2016-04-13 ENCOUNTER — Other Ambulatory Visit: Payer: Self-pay | Admitting: Family Medicine

## 2016-04-26 ENCOUNTER — Other Ambulatory Visit: Payer: Self-pay | Admitting: Family Medicine

## 2016-05-11 ENCOUNTER — Other Ambulatory Visit: Payer: Self-pay | Admitting: Family Medicine

## 2016-05-11 NOTE — Telephone Encounter (Signed)
Refill Gabapentin 

## 2016-05-20 ENCOUNTER — Other Ambulatory Visit: Payer: Self-pay | Admitting: Family Medicine

## 2016-05-20 NOTE — Telephone Encounter (Signed)
Rx Request 

## 2016-05-25 ENCOUNTER — Other Ambulatory Visit: Payer: Self-pay | Admitting: Pharmacist

## 2016-05-25 ENCOUNTER — Telehealth: Payer: Self-pay | Admitting: Family Medicine

## 2016-05-25 MED ORDER — LEVOTHYROXINE SODIUM 50 MCG PO TABS: 50.0000 ug | ORAL_TABLET | Freq: Every day | ORAL | 0 refills | Status: DC

## 2016-05-25 NOTE — Telephone Encounter (Signed)
Refilled x 30 days - needs office visit for refills

## 2016-05-25 NOTE — Telephone Encounter (Signed)
Medication Refill: levothyroxine (SYNTHROID, LEVOTHROID) 50 MCG tablet

## 2016-06-08 ENCOUNTER — Encounter: Payer: Self-pay | Admitting: Family Medicine

## 2016-06-08 ENCOUNTER — Telehealth: Payer: Self-pay | Admitting: Family Medicine

## 2016-06-08 ENCOUNTER — Ambulatory Visit: Payer: Medicare HMO | Attending: Family Medicine | Admitting: Family Medicine

## 2016-06-08 VITALS — BP 122/77 | HR 63 | Temp 98.3°F | Ht 63.0 in | Wt 191.0 lb

## 2016-06-08 DIAGNOSIS — Z79899 Other long term (current) drug therapy: Secondary | ICD-10-CM | POA: Diagnosis not present

## 2016-06-08 DIAGNOSIS — Z23 Encounter for immunization: Secondary | ICD-10-CM

## 2016-06-08 DIAGNOSIS — Z7982 Long term (current) use of aspirin: Secondary | ICD-10-CM | POA: Insufficient documentation

## 2016-06-08 DIAGNOSIS — E669 Obesity, unspecified: Secondary | ICD-10-CM | POA: Insufficient documentation

## 2016-06-08 DIAGNOSIS — E66811 Obesity, class 1: Secondary | ICD-10-CM

## 2016-06-08 DIAGNOSIS — E039 Hypothyroidism, unspecified: Secondary | ICD-10-CM

## 2016-06-08 DIAGNOSIS — Z0000 Encounter for general adult medical examination without abnormal findings: Secondary | ICD-10-CM

## 2016-06-08 LAB — CBC
HCT: 41.6 % (ref 35.0–45.0)
Hemoglobin: 14.1 g/dL (ref 11.7–15.5)
MCH: 30.7 pg (ref 27.0–33.0)
MCHC: 33.9 g/dL (ref 32.0–36.0)
MCV: 90.4 fL (ref 80.0–100.0)
MPV: 9.6 fL (ref 7.5–12.5)
Platelets: 254 10*3/uL (ref 140–400)
RBC: 4.6 MIL/uL (ref 3.80–5.10)
RDW: 13.6 % (ref 11.0–15.0)
WBC: 4.6 10*3/uL (ref 3.8–10.8)

## 2016-06-08 LAB — LIPID PANEL
Cholesterol: 218 mg/dL — ABNORMAL HIGH (ref 125–200)
HDL: 70 mg/dL (ref >=46)
LDL Cholesterol: 128 mg/dL (ref <130)
Total CHOL/HDL Ratio: 3.1 Ratio (ref <=5.0)
Triglycerides: 98 mg/dL (ref <150)
VLDL: 20 mg/dL (ref <30)

## 2016-06-08 LAB — TSH: TSH: 6.29 mIU/L — ABNORMAL HIGH

## 2016-06-08 LAB — POCT GLYCOSYLATED HEMOGLOBIN (HGB A1C): Hemoglobin A1C: 5.4

## 2016-06-08 MED ORDER — LEVOTHYROXINE SODIUM 75 MCG PO TABS: 75.0000 ug | ORAL_TABLET | Freq: Every day | ORAL | 1 refills | Status: DC

## 2016-06-08 MED ORDER — ZOSTER VACCINE LIVE 19400 UNT/0.65ML ~~LOC~~ SUSR: 0.6500 mL | Freq: Once | SUBCUTANEOUS | 0 refills | Status: AC

## 2016-06-08 NOTE — Progress Notes (Signed)
Subjective:  Patient ID: Krystal Moore, female    DOB: 06/20/1930  Age: 82 y.o. MRN: [id number]  CC: Follow-up (medication refills)   HPI Kwanesha Puglisi presents for    1. Hypothyroidism: she continues to take synthroid 50 mcg daily. She has gained 2 lbs since last OV, 5 # in last 4 years (weight range 177-189#). She walks 1 mile a day, gardens, swims 3 times a week, practices the organ daily. She eats chicken and fish, lots of fresh veggies, small slice of homemade angel food cake with low sugar whipped cream and fruit daily. She denies swelling, CP, SOB and depressed mood.    Social History  Substance Use Topics  . Smoking status: Never Smoker  . Smokeless tobacco: Never Used  . Alcohol use Yes     Comment: 1/4 glass per day    Outpatient Medications Prior to Visit  Medication Sig Dispense Refill  . aspirin 81 MG tablet Take 81 mg by mouth daily.    . Cyanocobalamin 5000 MCG CAPS Take 5,000 mcg by mouth daily. Reported on 10/11/2015 30 capsule 11  . gabapentin (NEURONTIN) 100 MG capsule TAKE 1 CAPSULE BY MOUTH THREE TIMES DAILY 90 capsule 0  . levothyroxine (SYNTHROID, LEVOTHROID) 50 MCG tablet Take 1 tablet (50 mcg total) by mouth daily before breakfast. 30 tablet 0  . LUTEIN PO Take 1 tablet by mouth daily.    . Multiple Vitamins-Minerals (MULTIVITAMIN PO) Take 1 tablet by mouth daily.     . Multiple Vitamins-Minerals (ZINC PO) Take 1 tablet by mouth daily. Reported on 10/11/2015    . ipratropium (ATROVENT) 0.06 % nasal spray Place 2 sprays into both nostrils 4 (four) times daily. (Patient not taking: Reported on 06/08/2016) 15 mL 2  . OVER THE COUNTER MEDICATION Take 1 tablet by mouth daily as needed (cold symtpoms). Reported on 10/11/2015    . sodium chloride (OCEAN) 0.65 % SOLN nasal spray Place 1 spray into both nostrils as needed for congestion. Reported on 10/11/2015     No facility-administered medications prior to visit.     ROS Review of  Systems  Constitutional: Negative for chills and fever.  Eyes: Negative for visual disturbance.  Respiratory: Negative for shortness of breath.   Cardiovascular: Negative for chest pain.  Gastrointestinal: Negative for abdominal pain and blood in stool.  Musculoskeletal: Negative for arthralgias and back pain.  Skin: Negative for rash.  Allergic/Immunologic: Negative for immunocompromised state.  Neurological:       Intermittent tingling and numbness in L medial ankle   Hematological: Negative for adenopathy. Does not bruise/bleed easily.  Psychiatric/Behavioral: Negative for dysphoric mood and suicidal ideas.    Objective:  BP 122/77 (BP Location: Left Arm, Patient Position: Sitting, Cuff Size: Large)   Pulse 63   Temp 98.3 F (36.8 C) (Oral)   Ht 5\' 3"  (1.6 m)   Wt 191 lb (86.6 kg)   SpO2 96%   BMI 33.83 kg/m   BP/Weight 06/08/2016 10/11/2015 [id number]  Systolic BP 123XX123 [id number]  Diastolic BP 77 79 91  Wt. (Lbs) 191 189 -  BMI 33.83 33.49 -    Physical Exam  Constitutional: She is oriented to person, place, and time. She appears well-developed and well-nourished. No distress.  HENT:  Head: Normocephalic and atraumatic.  Cardiovascular: Normal rate, regular rhythm, normal heart sounds and intact distal pulses.   Pulmonary/Chest: Effort normal and breath sounds normal.  Musculoskeletal: She exhibits no edema.  Neurological: She is alert and  oriented to person, place, and time.  Skin: Skin is warm and dry. No rash noted.  Psychiatric: She has a normal mood and affect.    Lab Results  Component Value Date   HGBA1C 5.4 06/08/2016   Lab Results  Component Value Date   TSH 7.192 (H) 10/06/2015    Assessment & Plan:   Dijonnaise was seen today for follow-up.  Diagnoses and all orders for this visit:  Healthcare maintenance -     Flu Vaccine QUAD 36+ mos IM  Hypothyroidism, unspecified hypothyroidism type -     TSH -     Lipid Panel -     CBC -     HgB A1c  Need  for shingles vaccine -     Zoster Vaccine Live, PF, (ZOSTAVAX) 91478 UNT/0.65ML injection; Inject 19,400 Units into the skin once.   No orders of the defined types were placed in this encounter.   Follow-up: Return in about 3 months (around 09/08/2016) for hypothyroidism and weight check .   Boykin Nearing MD

## 2016-06-08 NOTE — Patient Instructions (Addendum)
Krystal Moore was seen today for follow-up.  Diagnoses and all orders for this visit:  Healthcare maintenance -     Flu Vaccine QUAD 36+ mos IM  Hypothyroidism, unspecified hypothyroidism type -     TSH -     Lipid Panel -     CBC -     HgB A1c  Need for shingles vaccine -     Zoster Vaccine Live, PF, (ZOSTAVAX) 13086 UNT/0.65ML injection; Inject 19,400 Units into the skin once.   F/u in 3 months for hypothyroidism and weight check  Dr. Adrian Blackwater

## 2016-06-08 NOTE — Assessment & Plan Note (Signed)
A: hypothyroidism  Compliant with synthroid Upset with weight gain and inability to lose weight  P: TSH today A1c Lipids Adjust synthroid as needed

## 2016-06-08 NOTE — Progress Notes (Signed)
C/C: patient is concerned about her weight.

## 2016-06-08 NOTE — Telephone Encounter (Signed)
Called patient  Verified name and DOB  Elevated TSH as suspected Still hypothyroid Increased synthroid to 75 mcg daily Patient called

## 2016-06-09 ENCOUNTER — Other Ambulatory Visit: Payer: Self-pay | Admitting: Family Medicine

## 2016-06-24 ENCOUNTER — Other Ambulatory Visit: Payer: Self-pay | Admitting: Family Medicine

## 2016-11-21 ENCOUNTER — Encounter: Payer: Self-pay | Admitting: Family Medicine

## 2016-11-21 ENCOUNTER — Ambulatory Visit: Payer: PPO | Attending: Family Medicine | Admitting: Family Medicine

## 2016-11-21 VITALS — BP 129/82 | HR 64 | Temp 97.8°F | Ht 63.0 in | Wt 191.0 lb

## 2016-11-21 DIAGNOSIS — R238 Other skin changes: Secondary | ICD-10-CM

## 2016-11-21 DIAGNOSIS — Z79899 Other long term (current) drug therapy: Secondary | ICD-10-CM | POA: Diagnosis not present

## 2016-11-21 DIAGNOSIS — E669 Obesity, unspecified: Secondary | ICD-10-CM

## 2016-11-21 DIAGNOSIS — Z7982 Long term (current) use of aspirin: Secondary | ICD-10-CM | POA: Insufficient documentation

## 2016-11-21 DIAGNOSIS — E2839 Other primary ovarian failure: Secondary | ICD-10-CM

## 2016-11-21 DIAGNOSIS — Z23 Encounter for immunization: Secondary | ICD-10-CM

## 2016-11-21 DIAGNOSIS — E039 Hypothyroidism, unspecified: Secondary | ICD-10-CM | POA: Diagnosis not present

## 2016-11-21 DIAGNOSIS — E66811 Obesity, class 1: Secondary | ICD-10-CM

## 2016-11-21 LAB — TSH: TSH: 4.75 mIU/L — ABNORMAL HIGH

## 2016-11-21 MED ORDER — ZOSTER VACCINE LIVE 19400 UNT/0.65ML ~~LOC~~ SUSR: 0.6500 mL | Freq: Once | SUBCUTANEOUS | 0 refills | Status: DC

## 2016-11-21 MED ORDER — ZOSTER VACCINE LIVE 19400 UNT/0.65ML ~~LOC~~ SUSR: 0.6500 mL | Freq: Once | SUBCUTANEOUS | 0 refills | Status: AC

## 2016-11-21 NOTE — Progress Notes (Signed)
Pt is concerned about her weight gain.

## 2016-11-21 NOTE — Patient Instructions (Addendum)
Krystal Moore was seen today for hypothyroidism.  Diagnoses and all orders for this visit:  Hypothyroidism, unspecified type -     TSH  Estrogen deficiency -     DG Bone Density; Future  Papule -     Ambulatory referral to Dermatology  Need for shingles vaccine -     Zoster Vaccine Live, PF, (ZOSTAVAX) 57846 UNT/0.65ML injection; Inject 19,400 Units into the skin once.   Please increase weight bearing exercises to help with weight loss, I recommend fasting between 7 PM-9 AM max. If you develop weakness, fatigue or feel faint, please eat. Be sure to stay well hydrated.  You may call to schedule your bone density scan You may have your zostavax completed at [facility]  F/u in 6 months sooner if needed  Dr. Adrian Blackwater

## 2016-11-21 NOTE — Assessment & Plan Note (Addendum)
Compliant with synthroid Check TSH Adjust dose as needed  Lab Results  Component Value Date   TSH 4.75 (H) 11/21/2016   Increase synthroid to 100 mcg daily

## 2016-11-21 NOTE — Progress Notes (Signed)
Subjective:  Patient ID: Krystal Moore, female    DOB: Feb 26, 1930  Age: 83 y.o. MRN: [id number]  CC: Hypothyroidism   HPI Krystal Moore presents for    1. Hypothyroidism: she continues to take synthroid 75 mcg daily. Her weight is stable. She walks 1 mile a day, gardens, swims 3 times a week, practices the organ daily. She eats chicken and fish, lots of fresh veggies, small slice of homemade angel food cake with low sugar whipped cream and fruit daily. She denies swelling, CP, SOB and depressed mood. She would like to lose weight and ask about ketosis/intermittent fasting.  2. Papule on R eye: R lower eyelid papule. Painless. Obscuring vision a bit. No bleeding.    Social History  Substance Use Topics  . Smoking status: Never Smoker  . Smokeless tobacco: Never Used  . Alcohol use Yes     Comment: 1/4 glass per day    Outpatient Medications Prior to Visit  Medication Sig Dispense Refill  . aspirin 81 MG tablet Take 81 mg by mouth daily.    . Cyanocobalamin 5000 MCG CAPS Take 5,000 mcg by mouth daily. Reported on 10/11/2015 30 capsule 11  . gabapentin (NEURONTIN) 100 MG capsule Take 1 capsule (100 mg total) by mouth 3 (three) times daily. 90 capsule 2  . levothyroxine (SYNTHROID, LEVOTHROID) 75 MCG tablet Take 1 tablet (75 mcg total) by mouth daily before breakfast. 90 tablet 1  . LUTEIN PO Take 1 tablet by mouth daily.    . Multiple Vitamins-Minerals (MULTIVITAMIN PO) Take 1 tablet by mouth daily.     . Multiple Vitamins-Minerals (ZINC PO) Take 1 tablet by mouth daily. Reported on 10/11/2015    . OVER THE COUNTER MEDICATION Take 1 tablet by mouth daily as needed (cold symtpoms). Reported on 10/11/2015    . sodium chloride (OCEAN) 0.65 % SOLN nasal spray Place 1 spray into both nostrils as needed for congestion. Reported on 10/11/2015     No facility-administered medications prior to visit.     ROS Review of Systems  Constitutional: Negative for chills  and fever.  Eyes: Negative for visual disturbance.  Respiratory: Negative for shortness of breath.   Cardiovascular: Negative for chest pain.  Gastrointestinal: Negative for abdominal pain and blood in stool.  Musculoskeletal: Negative for arthralgias and back pain.  Skin: Negative for rash.  Allergic/Immunologic: Negative for immunocompromised state.  Neurological:       Intermittent tingling and numbness in L medial ankle   Hematological: Negative for adenopathy. Does not bruise/bleed easily.  Psychiatric/Behavioral: Negative for dysphoric mood and suicidal ideas.    Objective:  BP 129/82 (BP Location: Left Arm, Patient Position: Sitting, Cuff Size: Small)   Pulse 64   Temp 97.8 F (36.6 C) (Oral)   Ht 5\' 3"  (1.6 m)   Wt 191 lb (86.6 kg)   SpO2 97%   BMI 33.83 kg/m   BP/Weight 11/21/2016 06/08/2016 0000000  Systolic BP [id number]  Diastolic BP 82 77 79  Wt. (Lbs) 191 191 189  BMI 33.83 33.83 33.49    Physical Exam  Constitutional: She is oriented to person, place, and time. She appears well-developed and well-nourished. No distress.  HENT:  Head: Normocephalic and atraumatic.  Eyes:    Cardiovascular: Normal rate, regular rhythm, normal heart sounds and intact distal pulses.   Pulmonary/Chest: Effort normal and breath sounds normal.  Musculoskeletal: She exhibits no edema.  Neurological: She is alert and oriented to person, place, and time.  Skin: Skin is warm and dry. No rash noted.  Psychiatric: She has a normal mood and affect.    Lab Results  Component Value Date   HGBA1C 5.4 06/08/2016   Lab Results  Component Value Date   TSH 6.29 (H) 06/08/2016    Assessment & Plan:   Krystal Moore was seen today for hypothyroidism.  Diagnoses and all orders for this visit:  Hypothyroidism, unspecified type -     TSH  Estrogen deficiency -     DG Bone Density; Future  Papule -     Ambulatory referral to Dermatology  Need for shingles vaccine -      Discontinue: Zoster Vaccine Live, PF, (ZOSTAVAX) 96295 UNT/0.65ML injection; Inject 19,400 Units into the skin once. -     Zoster Vaccine Live, PF, (ZOSTAVAX) 28413 UNT/0.65ML injection; Inject 19,400 Units into the skin once.  Other orders -     Pneumococcal polysaccharide vaccine 23-valent greater than or equal to 2yo subcutaneous/IM   No orders of the defined types were placed in this encounter.   Follow-up: Return in about 6 months (around 05/21/2017) for hypothyroidism .   Boykin Nearing MD

## 2016-11-21 NOTE — Assessment & Plan Note (Signed)
Obesity  Desires to lose weight Advised limit fasting to 7 PM-9 AM max, with discontinuation if she develops symptoms of low sugar Advised increase weight bearing exercise

## 2016-11-23 ENCOUNTER — Telehealth: Payer: Self-pay

## 2016-11-23 MED ORDER — LEVOTHYROXINE SODIUM 100 MCG PO TABS: 100.0000 ug | ORAL_TABLET | Freq: Every day | ORAL | 5 refills | Status: DC

## 2016-11-23 NOTE — Addendum Note (Signed)
Addended by: Boykin Nearing on: 11/23/2016 09:30 AM   Modules accepted: Orders

## 2016-11-23 NOTE — Telephone Encounter (Signed)
Pt was called and informed of lab results and medication change. 

## 2016-11-23 NOTE — Addendum Note (Signed)
Addended by: Boykin Nearing on: 11/23/2016 09:29 AM   Modules accepted: Orders

## 2017-03-07 ENCOUNTER — Encounter: Payer: Self-pay | Admitting: Family Medicine

## 2017-03-22 ENCOUNTER — Encounter: Payer: Self-pay | Admitting: Family Medicine

## 2017-03-22 ENCOUNTER — Ambulatory Visit (HOSPITAL_COMMUNITY)
Admission: RE | Admit: 2017-03-22 | Discharge: 2017-03-22 | Disposition: A | Discharge status: Home / Self Care | Payer: PPO | Source: Ambulatory Visit | Attending: Family Medicine | Admitting: Family Medicine

## 2017-03-22 ENCOUNTER — Ambulatory Visit: Payer: PPO | Attending: Family Medicine | Admitting: Family Medicine

## 2017-03-22 VITALS — BP 121/81 | HR 61 | Temp 97.4°F | Wt 191.4 lb

## 2017-03-22 DIAGNOSIS — Z7982 Long term (current) use of aspirin: Secondary | ICD-10-CM | POA: Diagnosis not present

## 2017-03-22 DIAGNOSIS — E039 Hypothyroidism, unspecified: Secondary | ICD-10-CM | POA: Insufficient documentation

## 2017-03-22 DIAGNOSIS — M79662 Pain in left lower leg: Secondary | ICD-10-CM | POA: Insufficient documentation

## 2017-03-22 DIAGNOSIS — M7989 Other specified soft tissue disorders: Secondary | ICD-10-CM | POA: Diagnosis not present

## 2017-03-22 DIAGNOSIS — G629 Polyneuropathy, unspecified: Secondary | ICD-10-CM | POA: Diagnosis not present

## 2017-03-22 NOTE — Progress Notes (Signed)
Subjective:  Patient ID: Krystal Moore, female    DOB: 11/06/1929  Age: 83 y.o. MRN: [id number]  CC: Fall and Leg Swelling   HPI Keyle Doby has hypothyroidism, peripheral neuropathy she  presents for    1. Fall: she had a fall 6 days ago. She was standing on a sofa chair while trying to put a light bulb in her ceiling fan. She slipped off and hit the edges of a box table. She did not hit her head or suffer loss of consciousness. She reports immediate bruising, mild pain, no bleeding. She reports improvement in her bruising. 4 days ago she reports swelling, increased pain   Social History  Substance Use Topics  . Smoking status: Never Smoker  . Smokeless tobacco: Never Used  . Alcohol use Yes     Comment: 1/4 glass per day    Outpatient Medications Prior to Visit  Medication Sig Dispense Refill  . aspirin 81 MG tablet Take 81 mg by mouth daily.    . Cyanocobalamin 5000 MCG CAPS Take 5,000 mcg by mouth daily. Reported on 10/11/2015 30 capsule 11  . levothyroxine (SYNTHROID, LEVOTHROID) 100 MCG tablet Take 1 tablet (100 mcg total) by mouth daily before breakfast. 30 tablet 5  . LUTEIN PO Take 1 tablet by mouth daily.    . Multiple Vitamins-Minerals (MULTIVITAMIN PO) Take 1 tablet by mouth daily.     . Multiple Vitamins-Minerals (ZINC PO) Take 1 tablet by mouth daily. Reported on 10/11/2015    . OVER THE COUNTER MEDICATION Take 1 tablet by mouth daily as needed (cold symtpoms). Reported on 10/11/2015    . sodium chloride (OCEAN) 0.65 % SOLN nasal spray Place 1 spray into both nostrils as needed for congestion. Reported on 10/11/2015     No facility-administered medications prior to visit.     ROS Review of Systems  Constitutional: Negative for chills and fever.  Eyes: Negative for visual disturbance.  Respiratory: Negative for shortness of breath.   Cardiovascular: Negative for chest pain.  Gastrointestinal: Negative for abdominal pain and blood in  stool.  Musculoskeletal: Negative for arthralgias and back pain.  Skin: Negative for rash.  Allergic/Immunologic: Negative for immunocompromised state.  Neurological:       Intermittent tingling and numbness in L medial ankle   Hematological: Negative for adenopathy. Does not bruise/bleed easily.  Psychiatric/Behavioral: Negative for dysphoric mood and suicidal ideas.    Objective:  BP 121/81   Pulse 61   Temp 97.4 F (36.3 C) (Oral)   Wt 191 lb 6.4 oz (86.8 kg)   SpO2 95%   BMI 33.90 kg/m   BP/Weight 03/22/2017 11/21/2016 02/07/4080  Systolic BP 448 185 631  Diastolic BP 81 82 77  Wt. (Lbs) 191.4 191 191  BMI 33.9 33.83 33.83    Physical Exam  Constitutional: She is oriented to person, place, and time. She appears well-developed and well-nourished. No distress.  HENT:  Head: Normocephalic and atraumatic.  Eyes:    Cardiovascular: Normal rate, regular rhythm, normal heart sounds and intact distal pulses.   Pulmonary/Chest: Effort normal and breath sounds normal.  Musculoskeletal:       Left lower leg: She exhibits tenderness, swelling and edema. She exhibits no bony tenderness, no deformity and no laceration.       Legs: Neurological: She is alert and oriented to person, place, and time.  Skin: Skin is warm and dry. No rash noted.  Psychiatric: She has a normal mood and affect.  Lab Results  Component Value Date   HGBA1C 5.4 06/08/2016   Lab Results  Component Value Date   TSH 4.75 (H) 11/21/2016    Assessment & Plan:   Meleah was seen today for fall and leg swelling.  Diagnoses and all orders for this visit:  Pain and swelling of left lower leg -     CMP14+EGFR -     CBC -     D-dimer, quantitative (not at [facility]) -     VAS [place] LOWER EXTREMITY VENOUS (DVT); Future -     DG Tibia/Fibula Left; Future   No orders of the defined types were placed in this encounter.   Follow-up: No Follow-up on file.   Boykin Nearing MD

## 2017-03-22 NOTE — Assessment & Plan Note (Signed)
Repeat TSH check today

## 2017-03-22 NOTE — Assessment & Plan Note (Signed)
Acute onset of pain and swelling following fall LLE venous doppler negative for VTE with incidental finding of adenopathy  x-ray and CBC pending  Will plan to start clindamycin

## 2017-03-22 NOTE — Progress Notes (Signed)
*  PRELIMINARY RESULTS* Vascular Ultrasound Left lower extremity venous duplex has been completed.  Preliminary findings: No evidence of DVT or baker's cyst. Enlarged left inguinal lymph node.    Attempted to call Dr. Adrian Blackwater at [facility]. No answer at nurse line or doctor line. Will let patient leave.   Landry Mellow, RDMS, RVT  03/22/2017, 11:23 AM

## 2017-03-22 NOTE — Patient Instructions (Signed)
Krystal Moore was seen today for fall and leg swelling.  Diagnoses and all orders for this visit:  Pain and swelling of left lower leg -     CMP14+EGFR -     CBC -     D-dimer, quantitative (not at [facility]) -     VAS [place] LOWER EXTREMITY VENOUS (DVT); Future -     DG Tibia/Fibula Left; Future    Please present to [facility] for x-ray of L leg  [address], [place], [place] [address]  You have been scheduled for vascular ultrasound as well to evaluate for clot in L leg If there is a clot in a leg vein this is called a deep vein thrombosis or DVT  Elevated leg when resting  Icing will also help reduce swelling   F/u in 2 weeks for left leg pain and swelling following fall   Dr. Adrian Blackwater

## 2017-03-23 ENCOUNTER — Telehealth: Payer: Self-pay | Admitting: Family Medicine

## 2017-03-23 ENCOUNTER — Ambulatory Visit
Admission: RE | Admit: 2017-03-23 | Discharge: 2017-03-23 | Disposition: A | Discharge status: Home / Self Care | Payer: PPO | Source: Ambulatory Visit | Attending: Family Medicine | Admitting: Family Medicine

## 2017-03-23 DIAGNOSIS — M7989 Other specified soft tissue disorders: Principal | ICD-10-CM

## 2017-03-23 DIAGNOSIS — M79662 Pain in left lower leg: Secondary | ICD-10-CM

## 2017-03-23 LAB — CBC
Hematocrit: 41.6 % (ref 34.0–46.6)
Hemoglobin: 13.5 g/dL (ref 11.1–15.9)
MCH: 29.7 pg (ref 26.6–33.0)
MCHC: 32.5 g/dL (ref 31.5–35.7)
MCV: 91 fL (ref 79–97)
Platelets: 267 10*3/uL (ref 150–379)
RBC: 4.55 x10E6/uL (ref 3.77–5.28)
RDW: 14.3 % (ref 12.3–15.4)
WBC: 4.1 10*3/uL (ref 3.4–10.8)

## 2017-03-23 LAB — CMP14+EGFR
ALT: 14 IU/L (ref 0–32)
AST: 22 IU/L (ref 0–40)
Albumin/Globulin Ratio: 1.6 (ref 1.2–2.2)
Albumin: 4.2 g/dL (ref 3.5–4.7)
Alkaline Phosphatase: 65 IU/L (ref 39–117)
BUN/Creatinine Ratio: 21 (ref 12–28)
BUN: 18 mg/dL (ref 8–27)
Bilirubin Total: 0.5 mg/dL (ref 0.0–1.2)
CO2: 22 mmol/L (ref 18–29)
Calcium: 9 mg/dL (ref 8.7–10.3)
Chloride: 104 mmol/L (ref 96–106)
Creatinine, Ser: 0.87 mg/dL (ref 0.57–1.00)
GFR calc Af Amer: 71 mL/min/{1.73_m2} (ref >59)
GFR calc non Af Amer: 61 mL/min/{1.73_m2} (ref >59)
Globulin, Total: 2.6 g/dL (ref 1.5–4.5)
Glucose: 93 mg/dL (ref 65–99)
Potassium: 4.6 mmol/L (ref 3.5–5.2)
Sodium: 138 mmol/L (ref 134–144)
Total Protein: 6.8 g/dL (ref 6.0–8.5)

## 2017-03-23 LAB — D-DIMER, QUANTITATIVE: D-DIMER: 0.82 mg/L FEU — ABNORMAL HIGH (ref 0.00–0.49)

## 2017-03-23 MED ORDER — CLINDAMYCIN HCL 300 MG PO CAPS: 300.0000 mg | ORAL_CAPSULE | Freq: Four times a day (QID) | ORAL | 0 refills | Status: DC

## 2017-03-23 NOTE — Telephone Encounter (Signed)
Called patient Verified name and DOB Gave results   Normal CBC and CMP Slightly elevated D dimer with left leg venous doppler negative for DVT  Left leg venous doppler negative for VTE Prominent lymph nodes on the L   She reports leg is red, shiny and swollen.  There is little to no pain.   Plan: Clindamycin 300 mg 4 times daily for 10 days Elevation of leg, ice Ibuprofen 200-400 mg twice daily with food Complete ordered x-ray F/u in 2 weeks, sooner if needed. She agrees with plan and voices understanding

## 2017-03-26 ENCOUNTER — Telehealth: Payer: Self-pay | Admitting: Family Medicine

## 2017-03-26 NOTE — Telephone Encounter (Signed)
Called to patient She has designated party release on file Left VM Negative L tib/fib x-ray. No fracture She is advised to elevate,ice, take clindamycin for L leg swelling following fall possible cellulitis vs muscle contusion  She is warned to watch out for paleness or numbness that would suggest poor blood flow due to swelling and go directly to the ED if this occurs.  She is asked to call with questions

## 2017-04-04 ENCOUNTER — Other Ambulatory Visit: Payer: Self-pay | Admitting: Family Medicine

## 2017-04-04 DIAGNOSIS — M7989 Other specified soft tissue disorders: Principal | ICD-10-CM

## 2017-04-04 DIAGNOSIS — M79662 Pain in left lower leg: Secondary | ICD-10-CM

## 2017-04-04 NOTE — Telephone Encounter (Signed)
Called patient Verified name and DOB She is taking pine bark emulsion She reports the redness and swelling is improving. She reports pink discoloration, swelling and numbness around the ankle.  She denies numbness, pallor or coldness in foot and toes.    Advised patient to take clindamycin. Also requested follow up visit  Please call patient to schedule follow up for L leg and ankle pain and redness following fall, 15 min, per Dr. Adrian Blackwater I would like to see her next week or week after

## 2017-04-05 NOTE — Telephone Encounter (Signed)
Called pt. And left VM to call back and schedule appt. With PCP. When pt. Calls back schedule 15 min appt. For L leg and ankle pain and redness.

## 2017-05-04 ENCOUNTER — Ambulatory Visit: Payer: PPO | Attending: Family Medicine | Admitting: Family Medicine

## 2017-05-04 ENCOUNTER — Encounter: Payer: Self-pay | Admitting: Family Medicine

## 2017-05-04 VITALS — BP 137/75 | HR 67 | Temp 98.3°F | Ht 63.0 in | Wt 188.2 lb

## 2017-05-04 DIAGNOSIS — E039 Hypothyroidism, unspecified: Secondary | ICD-10-CM | POA: Diagnosis not present

## 2017-05-04 DIAGNOSIS — M7989 Other specified soft tissue disorders: Secondary | ICD-10-CM | POA: Diagnosis not present

## 2017-05-04 DIAGNOSIS — M79605 Pain in left leg: Secondary | ICD-10-CM | POA: Diagnosis not present

## 2017-05-04 DIAGNOSIS — Z7982 Long term (current) use of aspirin: Secondary | ICD-10-CM | POA: Insufficient documentation

## 2017-05-04 DIAGNOSIS — G629 Polyneuropathy, unspecified: Secondary | ICD-10-CM | POA: Insufficient documentation

## 2017-05-04 MED ORDER — MEDICAL COMPRESSION STOCKINGS MISC: 0 refills | Status: DC

## 2017-05-04 NOTE — Assessment & Plan Note (Signed)
Associated pain has resolved with clindamycin and time There is still increased swelling compared to the right leg  Plan: ABI done in office  Compression stockings

## 2017-05-04 NOTE — Progress Notes (Signed)
Subjective:  Patient ID: Krystal Moore, female    DOB: May 04, 1930  Age: 83 y.o. MRN: [id number]  CC: Leg Swelling   HPI Keiasha Diep has hypothyroidism, peripheral neuropathy  she  presents for    1. Left leg swelling and pain: she had a fall on 03/16/17. She was standing on a sofa chair attempting to change a light bulb and feel off. She hit the edges of a box table. She did not hit her head or suffer loss of consciousness. She reports immediate bruising, mild pain, no bleeding in her left leg. She reports improvement in her bruising over the first week bu worsening swelling and  increased pain. She presented to care. X-ray of left tib-fib and LLE venous doppler were negative. She completed clindamycin for possible cellulitis. Today she reports pain has resolved. However there is residual swelling. She has swelling in both lower legs but it is worse on the left. 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    Social History  Substance Use Topics  . Smoking status: Never Smoker  . Smokeless tobacco: Never Used  . Alcohol use Yes     Comment: 1/4 glass per day    Outpatient Medications Prior to Visit  Medication Sig Dispense Refill  . aspirin 81 MG tablet Take 81 mg by mouth daily.    . clindamycin (CLEOCIN) 300 MG capsule TAKE ONE CAPSULE BY MOUTH FOUR TIMES DAILY 40 capsule 0  . Cyanocobalamin 5000 MCG CAPS Take 5,000 mcg by mouth daily. Reported on 10/11/2015 30 capsule 11  . levothyroxine (SYNTHROID, LEVOTHROID) 100 MCG tablet Take 1 tablet (100 mcg total) by mouth daily before breakfast. 30 tablet 5  . LUTEIN PO Take 1 tablet by mouth daily.    . Multiple Vitamins-Minerals (MULTIVITAMIN PO) Take 1 tablet by mouth daily.     . Multiple Vitamins-Minerals (ZINC PO) Take 1 tablet by mouth daily. Reported on 10/11/2015    . OVER THE COUNTER MEDICATION Take 1 tablet by mouth daily as needed (cold symtpoms). Reported on 10/11/2015    . sodium  chloride (OCEAN) 0.65 % SOLN nasal spray Place 1 spray into both nostrils as needed for congestion. Reported on 10/11/2015     No facility-administered medications prior to visit.     ROS Review of Systems  Constitutional: Negative for chills and fever.  Eyes: Negative for visual disturbance.  Respiratory: Negative for shortness of breath.   Cardiovascular: Negative for chest pain.  Gastrointestinal: Negative for abdominal pain and blood in stool.  Musculoskeletal: Negative for arthralgias and back pain.  Skin: Negative for rash.  Allergic/Immunologic: Negative for immunocompromised state.  Neurological:       Intermittent tingling and numbness in L medial ankle   Hematological: Negative for adenopathy. Does not bruise/bleed easily.  Psychiatric/Behavioral: Negative for dysphoric mood and suicidal ideas.    Objective:  BP 137/75   Pulse 67   Temp 98.3 F (36.8 C) (Oral)   Ht 5\' 3"  (1.6 m)   Wt 188 lb 3.2 oz (85.4 kg)   SpO2 96%   BMI 33.34 kg/m   BP/Weight 05/04/2017 03/22/2017 7/65/4650  Systolic BP 354 656 812  Diastolic BP 75 81 82  Wt. (Lbs) 188.2 191.4 191  BMI 33.34 33.9 33.83    Physical Exam  Constitutional: She is oriented to person, place, and time. She appears well-developed and well-nourished. No distress.  HENT:  Head: Normocephalic and atraumatic.  Eyes:  Cardiovascular: Normal rate, regular rhythm, normal heart sounds and intact distal pulses.   Pulmonary/Chest: Effort normal and breath sounds normal.  Musculoskeletal: She exhibits edema (in lower legs worse on the L side ).       Left lower leg: She exhibits swelling and edema. She exhibits no tenderness, no bony tenderness, no deformity and no laceration.  Neurological: She is alert and oriented to person, place, and time.  Skin: Skin is warm and dry. No rash noted.  Psychiatric: She has a normal mood and affect.    Lab Results  Component Value Date   HGBA1C 5.4 06/08/2016   Lab Results    Component Value Date   TSH 4.75 (H) 11/21/2016   Lab Results  Component Value Date   WBC 4.1 03/22/2017   HGB 13.5 03/22/2017   HCT 41.6 03/22/2017   MCV 91 03/22/2017   PLT 267 03/22/2017   ABI Left 1.12 Right 1.13  Assessment & Plan:   Shirline was seen today for leg swelling.  Diagnoses and all orders for this visit:  Hypothyroidism, unspecified type -     TSH  Left leg swelling -     POCT ABI Screening for Pilot No Charge -     Elastic Bandages & Supports (MEDICAL COMPRESSION STOCKINGS) MISC; Knee high compression stockings 25-30 mm Hg pressure wear daily   No orders of the defined types were placed in this encounter.   Follow-up: Return in about 3 months (around 08/04/2017) for hypothyroidism.   Boykin Nearing MD

## 2017-05-04 NOTE — Patient Instructions (Addendum)
Krystal Moore was seen today for leg swelling.  Diagnoses and all orders for this visit:  Hypothyroidism, unspecified type -     TSH  Left leg swelling -     POCT ABI Screening for Pilot No Charge -     Elastic Bandages & Supports (MEDICAL COMPRESSION STOCKINGS) MISC; Knee high compression stockings 25-30 mm Hg pressure wear daily   Please wear compression stockings during the day  Your ABIs are normal  You have normal pressure in your legs  You will be called with TSH results  F/u in 3 months for hypothyroidism   Dr. Adrian Blackwater

## 2017-05-04 NOTE — Assessment & Plan Note (Signed)
TSH today Last TSH was elevated and synthroid dose was increased

## 2017-05-05 LAB — TSH: TSH: 2.25 u[IU]/mL (ref 0.450–4.500)

## 2017-05-05 MED ORDER — LEVOTHYROXINE SODIUM 100 MCG PO TABS: 100.0000 ug | ORAL_TABLET | Freq: Every day | ORAL | 11 refills | Status: DC

## 2017-05-05 NOTE — Addendum Note (Signed)
Addended by: Boykin Nearing on: 05/05/2017 11:44 PM   Modules accepted: Orders

## 2017-05-16 ENCOUNTER — Telehealth: Payer: Self-pay

## 2017-05-16 NOTE — Telephone Encounter (Signed)
Pt was called ans informed of lab results.

## 2017-08-03 ENCOUNTER — Ambulatory Visit: Payer: PPO | Attending: Internal Medicine | Admitting: Internal Medicine

## 2017-08-03 ENCOUNTER — Encounter: Payer: Self-pay | Admitting: Internal Medicine

## 2017-08-03 VITALS — BP 140/83 | HR 65 | Temp 98.6°F | Resp 16 | Wt 191.4 lb

## 2017-08-03 DIAGNOSIS — Z780 Asymptomatic menopausal state: Secondary | ICD-10-CM | POA: Diagnosis not present

## 2017-08-03 DIAGNOSIS — E039 Hypothyroidism, unspecified: Secondary | ICD-10-CM | POA: Diagnosis not present

## 2017-08-03 DIAGNOSIS — Z91011 Allergy to milk products: Secondary | ICD-10-CM | POA: Diagnosis not present

## 2017-08-03 DIAGNOSIS — R030 Elevated blood-pressure reading, without diagnosis of hypertension: Secondary | ICD-10-CM | POA: Diagnosis not present

## 2017-08-03 DIAGNOSIS — Z23 Encounter for immunization: Secondary | ICD-10-CM | POA: Diagnosis not present

## 2017-08-03 DIAGNOSIS — Z882 Allergy status to sulfonamides status: Secondary | ICD-10-CM | POA: Insufficient documentation

## 2017-08-03 DIAGNOSIS — Z7982 Long term (current) use of aspirin: Secondary | ICD-10-CM | POA: Insufficient documentation

## 2017-08-03 DIAGNOSIS — Z79899 Other long term (current) drug therapy: Secondary | ICD-10-CM | POA: Diagnosis not present

## 2017-08-03 DIAGNOSIS — Z91048 Other nonmedicinal substance allergy status: Secondary | ICD-10-CM | POA: Diagnosis not present

## 2017-08-03 DIAGNOSIS — Z8249 Family history of ischemic heart disease and other diseases of the circulatory system: Secondary | ICD-10-CM | POA: Insufficient documentation

## 2017-08-03 DIAGNOSIS — E669 Obesity, unspecified: Secondary | ICD-10-CM | POA: Insufficient documentation

## 2017-08-03 DIAGNOSIS — Z91018 Allergy to other foods: Secondary | ICD-10-CM | POA: Diagnosis not present

## 2017-08-03 DIAGNOSIS — N959 Unspecified menopausal and perimenopausal disorder: Secondary | ICD-10-CM | POA: Diagnosis not present

## 2017-08-03 DIAGNOSIS — Z823 Family history of stroke: Secondary | ICD-10-CM | POA: Diagnosis not present

## 2017-08-03 NOTE — Progress Notes (Signed)
Patient ID: Norman Bier, female    DOB: Sep 02, 1930  MRN: [id number]  CC: re-establish; Weight Gain; and Hypothyroidism   Subjective: Krystal Moore is a 83 y.o. female who presents for chronic ds management. Last saw Dr. Adrian Blackwater 04/2017 Her concerns today include:  Pt with hx of hypothyroidism, obesity  1. C/O not being able to lose wgh. -eating fairly healthy - avoids sugary drinks and snacks. Eats whole wheat bread. No red meat. Usually eat salad plate size.  -walks her pets 3 x a day. Swims once a wk at the [employer] and gardens.  -last TSH level was 2.250 Compliant with Levothyroxine  2. HM: due for bone density and flu shot  Patient Active Problem List   Diagnosis Date Noted  . Immunization due 08/03/2017  . Left leg swelling 05/04/2017  . Obesity (BMI 30.0-34.9) 06/08/2016  . Allergic rhinitis 10/11/2015  . Hypothyroidism 12/10/2013  . Peripheral neuropathy 05/16/2013     Current Outpatient Prescriptions on File Prior to Visit  Medication Sig Dispense Refill  . aspirin 81 MG tablet Take 81 mg by mouth daily.    . Cyanocobalamin 5000 MCG CAPS Take 5,000 mcg by mouth daily. Reported on 10/11/2015 30 capsule 11  . Elastic Bandages & Supports (MEDICAL COMPRESSION STOCKINGS) MISC Knee high compression stockings 25-30 mm Hg pressure wear daily 4 each 0  . levothyroxine (SYNTHROID, LEVOTHROID) 100 MCG tablet Take 1 tablet (100 mcg total) by mouth daily before breakfast. 30 tablet 11  . LUTEIN PO Take 1 tablet by mouth daily.    . Multiple Vitamins-Minerals (MULTIVITAMIN PO) Take 1 tablet by mouth daily.     . Multiple Vitamins-Minerals (ZINC PO) Take 1 tablet by mouth daily. Reported on 10/11/2015    . OVER THE COUNTER MEDICATION Take 1 tablet by mouth daily as needed (cold symtpoms). Reported on 10/11/2015    . sodium chloride (OCEAN) 0.65 % SOLN nasal spray Place 1 spray into both nostrils as needed for congestion. Reported on 10/11/2015      No current facility-administered medications on file prior to visit.     Allergies  Allergen Reactions  . Milk-Related Compounds Nausea And Vomiting  . Poison Ivy Extract [Poison Ivy Extract] Itching, Rash and Other (See Comments)    Muscle damage   . Sulfur Shortness Of Breath and Swelling  . Tobacco [Nicotiana Tabacum] Anaphylaxis and Swelling    Allergic to cigarette smoke  . Orange Juice [Orange Oil] Nausea And Vomiting    Social History   Social History  . Marital status: Widowed    Spouse name: N/A  . Number of children: 0  . Years of education: college   Occupational History  . Not on file.   Social History Main Topics  . Smoking status: Never Smoker  . Smokeless tobacco: Never Used  . Alcohol use Yes     Comment: 1/4 glass per day  . Drug use: No  . Sexual activity: Not on file   Other Topics Concern  . Not on file   Social History Narrative   Patient lives at home and she has a roommate.    Has a poodle.    Patient works part time Advertising copywriter.   Patient has a college education.   Both handed. Plays organ since age 24.    Caffeine- None     Family History  Problem Relation Age of Onset  . Stroke Mother        questionable  .  Clotting disorder Mother   . Heart attack Father     Past Surgical History:  Procedure Laterality Date  . MOUTH SURGERY    . NASAL SINUS SURGERY      ROS: Review of Systems  Constitutional: Negative for activity change and appetite change.       Walks 8 blocks 3 x a day  Respiratory: Negative for cough and shortness of breath.   Cardiovascular: Negative for chest pain and palpitations.  Musculoskeletal:       Almost fall again about 1 mth ago. She was turning around and LT lower leg, hit against large wooden firewood box in her house. She was able to catch herself.   Neurological:       Sleeping 7-8 hrs at nights   PHYSICAL EXAM: BP 140/83   Pulse 65   Temp 98.6 F (37 C) (Oral)   Resp 16   Wt 191 lb  6.4 oz (86.8 kg)   SpO2 95%   BMI 33.90 kg/m   Wt Readings from Last 3 Encounters:  08/03/17 191 lb 6.4 oz (86.8 kg)  05/04/17 188 lb 3.2 oz (85.4 kg)  03/22/17 191 lb 6.4 oz (86.8 kg)     Physical Exam General appearance - alert, well appearing, pleasant elderly female and in no distress Mental status - alert, oriented to person, place, and time, normal mood, behavior, speech, dress, motor activity, and thought processes Mouth - mucous membranes moist, pharynx normal without lesions Neck - supple, no significant adenopathy Chest - clear to auscultation, no wheezes, rales or rhonchi, symmetric air entry Heart - normal rate, regular rhythm, normal S1, S2, no murmurs, rubs, clicks or gallops Extremities - peripheral pulses normal, no pedal edema, no clubbing or cyanosis Gait: get up and go test normal. Gait is a little slow but steady. Walks unassisted Depression screen [facility] 2/9 08/03/2017 05/04/2017 03/22/2017 11/21/2016 10/11/2015  Decreased Interest 0 0 0 0 0  Down, Depressed, Hopeless 0 0 0 0 0  PHQ - 2 Score 0 0 0 0 0  Altered sleeping - 0 0 0 -  Tired, decreased energy - 0 0 0 -  Change in appetite - 0 0 0 -  Feeling bad or failure about yourself  - 0 0 0 -  Trouble concentrating - 0 0 0 -  Moving slowly or fidgety/restless - 0 0 0 -  Suicidal thoughts - 0 0 0 -  PHQ-9 Score - 0 0 0 -    Lab Results  Component Value Date   WBC 4.1 03/22/2017   HGB 13.5 03/22/2017   HCT 41.6 03/22/2017   MCV 91 03/22/2017   PLT 267 03/22/2017     Chemistry      Component Value Date/Time   NA 138 03/22/2017 0941   K 4.6 03/22/2017 0941   CL 104 03/22/2017 0941   CO2 22 03/22/2017 0941   BUN 18 03/22/2017 0941   CREATININE 0.87 03/22/2017 0941      Component Value Date/Time   CALCIUM 9.0 03/22/2017 0941   ALKPHOS 65 03/22/2017 0941   AST 22 03/22/2017 0941   ALT 14 03/22/2017 0941   BILITOT 0.5 03/22/2017 0941      ASSESSMENT AND PLAN: 1. Hypothyroidism, unspecified  type -continue Levothyroxine - Comprehensive metabolic panel  2. Immunization due - Flu Vaccine QUAD 6+ mos PF IM (Fluarix Quad PF)  3. Post-menopausal - DG Bone Density; Future  4. Elevated blood pressure reading -encourage low salt diet Nurse only BP  check in 1 mth  5. Obesity -encouraged her to continue eating healthy and exercising regularly. -advise to rearrange furniture in house as needed to make walking safe and help prevent falls  Patient was given the opportunity to ask questions.  Patient verbalized understanding of the plan and was able to repeat key elements of the plan.   Orders Placed This Encounter  Procedures  . Flu Vaccine QUAD 6+ mos PF IM (Fluarix Quad PF)     Requested Prescriptions    No prescriptions requested or ordered in this encounter    No Follow-up on file.  Karle Plumber, MD, FACP

## 2017-08-03 NOTE — Patient Instructions (Addendum)
Give follow up appointment with Carola Frost in 1 month for repeat blood pressure check.    Influenza Virus Vaccine injection (Fluarix) What is this medicine? INFLUENZA VIRUS VACCINE (in floo EN zuh VAHY ruhs vak SEEN) helps to reduce the risk of getting influenza also known as the flu. This medicine may be used for other purposes; ask your health care provider or pharmacist if you have questions. COMMON BRAND NAME(S): Fluarix, Fluzone What should I tell my health care provider before I take this medicine? They need to know if you have any of these conditions: -bleeding disorder like hemophilia -fever or infection -Guillain-Barre syndrome or other neurological problems -immune system problems -infection with the human immunodeficiency virus (HIV) or AIDS -low blood platelet counts -multiple sclerosis -an unusual or allergic reaction to influenza virus vaccine, eggs, chicken proteins, latex, gentamicin, other medicines, foods, dyes or preservatives -pregnant or trying to get pregnant -breast-feeding How should I use this medicine? This vaccine is for injection into a muscle. It is given by a health care professional. A copy of Vaccine Information Statements will be given before each vaccination. Read this sheet carefully each time. The sheet may change frequently. Talk to your pediatrician regarding the use of this medicine in children. Special care may be needed. Overdosage: If you think you have taken too much of this medicine contact a poison control center or emergency room at once. NOTE: This medicine is only for you. Do not share this medicine with others. What if I miss a dose? This does not apply. What may interact with this medicine? -chemotherapy or radiation therapy -medicines that lower your immune system like etanercept, anakinra, infliximab, and adalimumab -medicines that treat or prevent blood clots like warfarin -phenytoin -steroid medicines like prednisone or  cortisone -theophylline -vaccines This list may not describe all possible interactions. Give your health care provider a list of all the medicines, herbs, non-prescription drugs, or dietary supplements you use. Also tell them if you smoke, drink alcohol, or use illegal drugs. Some items may interact with your medicine. What should I watch for while using this medicine? Report any side effects that do not go away within 3 days to your doctor or health care professional. Call your health care provider if any unusual symptoms occur within 6 weeks of receiving this vaccine. You may still catch the flu, but the illness is not usually as bad. You cannot get the flu from the vaccine. The vaccine will not protect against colds or other illnesses that may cause fever. The vaccine is needed every year. What side effects may I notice from receiving this medicine? Side effects that you should report to your doctor or health care professional as soon as possible: -allergic reactions like skin rash, itching or hives, swelling of the face, lips, or tongue Side effects that usually do not require medical attention (report to your doctor or health care professional if they continue or are bothersome): -fever -headache -muscle aches and pains -pain, tenderness, redness, or swelling at site where injected -weak or tired This list may not describe all possible side effects. Call your doctor for medical advice about side effects. You may report side effects to FDA at 1-800-FDA-1088. Where should I keep my medicine? This vaccine is only given in a clinic, pharmacy, doctor's office, or other health care setting and will not be stored at home. NOTE: This sheet is a summary. It may not cover all possible information. If you have questions about this medicine, talk to your  doctor, pharmacist, or health care provider.  2018 Elsevier/Gold Standard (2008-05-06 09:30:40)

## 2017-08-05 LAB — COMPREHENSIVE METABOLIC PANEL
ALT: 25 IU/L (ref 0–32)
AST: 26 IU/L (ref 0–40)
Albumin/Globulin Ratio: 1.7 (ref 1.2–2.2)
Albumin: 4 g/dL (ref 3.5–4.7)
Alkaline Phosphatase: 61 IU/L (ref 39–117)
BUN/Creatinine Ratio: 15 (ref 12–28)
BUN: 14 mg/dL (ref 8–27)
Bilirubin Total: 0.4 mg/dL (ref 0.0–1.2)
CO2: 23 mmol/L (ref 20–29)
Calcium: 9.4 mg/dL (ref 8.7–10.3)
Chloride: 102 mmol/L (ref 96–106)
Creatinine, Ser: 0.96 mg/dL (ref 0.57–1.00)
GFR calc Af Amer: 63 mL/min/{1.73_m2} (ref >59)
GFR calc non Af Amer: 54 mL/min/{1.73_m2} — ABNORMAL LOW (ref >59)
Globulin, Total: 2.3 g/dL (ref 1.5–4.5)
Glucose: 90 mg/dL (ref 65–99)
Potassium: 4.4 mmol/L (ref 3.5–5.2)
Sodium: 138 mmol/L (ref 134–144)
Total Protein: 6.3 g/dL (ref 6.0–8.5)

## 2017-08-21 ENCOUNTER — Telehealth: Payer: Self-pay | Admitting: Internal Medicine

## 2017-08-21 DIAGNOSIS — Z780 Asymptomatic menopausal state: Secondary | ICD-10-CM

## 2017-08-21 NOTE — Telephone Encounter (Signed)
The [facility] center call to inform the provider that need to change the order for this pt since pt has eatna/medicare, you need to change the diagnostic on the order if you have any question please call them back

## 2017-08-24 ENCOUNTER — Other Ambulatory Visit: Payer: Self-pay | Admitting: Internal Medicine

## 2017-08-24 DIAGNOSIS — E2839 Other primary ovarian failure: Secondary | ICD-10-CM

## 2017-08-24 NOTE — Telephone Encounter (Signed)
Contacted the [facility] to get some clarification. Wasn't able to get any front desk would have to have someone to call me back

## 2017-09-09 NOTE — Telephone Encounter (Signed)
Will reorder bone density with dx of estrogen deficiency.

## 2017-12-19 ENCOUNTER — Ambulatory Visit
Admission: RE | Admit: 2017-12-19 | Discharge: 2017-12-19 | Disposition: A | Discharge status: Home / Self Care | Payer: PPO | Source: Ambulatory Visit | Attending: Internal Medicine | Admitting: Internal Medicine

## 2017-12-19 DIAGNOSIS — Z780 Asymptomatic menopausal state: Secondary | ICD-10-CM

## 2017-12-26 ENCOUNTER — Encounter: Payer: Self-pay | Admitting: Internal Medicine

## 2017-12-26 ENCOUNTER — Telehealth: Payer: Self-pay | Admitting: Internal Medicine

## 2017-12-26 NOTE — Telephone Encounter (Signed)
Phone call placed to patient today to discuss results of bone density study.  I left message on voicemail informing her of who I am and that I was calling to discuss the results of the bone density.  I informed her that I will send the results via my chart and she can reply to me that way. Patient calculated Frax score is 5.5% at the hip and 15% for major osteoporotic fracture.  I recommend vitamin D 800 IU daily and calcium 600 mg twice a day.  I also recommend regular aerobic exercise 3-4 days a week and starting Fosamax 35 mg once a week to help prevent osteoporosis given her FRAX score.

## 2018-01-09 ENCOUNTER — Telehealth: Payer: Self-pay | Admitting: Internal Medicine

## 2018-01-09 NOTE — Telephone Encounter (Signed)
Contacted pt to go over bone density results. Pt didn't answer lvm asking pt to give me a call back at her earliest convenience

## 2018-01-09 NOTE — Telephone Encounter (Signed)
I received notification through Mychart that pt has not read the message that was sent to her earlier this month regarding her bone density study.  I will have my CMA try to reach.

## 2018-01-09 NOTE — Telephone Encounter (Signed)
Patient called to check up on her bone density test. Please fu at your earliest convenience.

## 2018-01-10 NOTE — Telephone Encounter (Signed)
Returned pt call to go over bone density results. Pt didn't answer left a detailed vm informing pt that I will be mailing her bone density results to her also Dr. Wynetta Moore emailed her in [place] regarding results and if she has any questions or concerns to give me a call

## 2018-04-03 ENCOUNTER — Other Ambulatory Visit: Payer: Self-pay

## 2018-04-03 DIAGNOSIS — E039 Hypothyroidism, unspecified: Secondary | ICD-10-CM

## 2018-04-10 ENCOUNTER — Ambulatory Visit: Payer: PPO | Attending: Internal Medicine | Admitting: Physician Assistant

## 2018-04-10 VITALS — BP 139/69 | HR 62 | Temp 98.7°F | Resp 18 | Ht 63.0 in | Wt 190.0 lb

## 2018-04-10 DIAGNOSIS — Z7982 Long term (current) use of aspirin: Secondary | ICD-10-CM | POA: Diagnosis not present

## 2018-04-10 DIAGNOSIS — E039 Hypothyroidism, unspecified: Secondary | ICD-10-CM | POA: Insufficient documentation

## 2018-04-10 DIAGNOSIS — G47419 Narcolepsy without cataplexy: Secondary | ICD-10-CM | POA: Diagnosis not present

## 2018-04-10 DIAGNOSIS — G629 Polyneuropathy, unspecified: Secondary | ICD-10-CM | POA: Diagnosis not present

## 2018-04-10 DIAGNOSIS — E669 Obesity, unspecified: Secondary | ICD-10-CM | POA: Insufficient documentation

## 2018-04-10 DIAGNOSIS — R635 Abnormal weight gain: Secondary | ICD-10-CM | POA: Diagnosis not present

## 2018-04-10 DIAGNOSIS — Z79890 Hormone replacement therapy (postmenopausal): Secondary | ICD-10-CM | POA: Diagnosis not present

## 2018-04-10 NOTE — Progress Notes (Signed)
Patient ID: Krystal Moore, female   DOB: [birth year], 84 y.o.   MRN: [id number]   Krystal Moore, is a 84 y.o. female  FYB:[id number]  NID:[id number]  DOB - 14-May-1930  Subjective:  Chief Complaint and HPI: Krystal Moore is a 84 y.o. female here today for RF.  C/o weight gain "since I've been coming here."  Reviewing epic, there has been a 13 pound fluctuation in weight over the last 5 years.  Very active-works in her garden and  plays piano and organ daily.  Been compliant on 124mcg synthroid.  Says her diet is good and reasonable  ED/Hospital notes reviewed.   Social History: Family history:  ROS:   Constitutional:  No f/c, No night sweats, No unexplained weight loss. EENT:  No vision changes, No blurry vision, No hearing changes. No mouth, throat, or ear problems.  Respiratory: No cough, No SOB Cardiac: No CP, no palpitations GI:  No abd pain, No N/V/D. GU: No Urinary s/sx Musculoskeletal: No joint pain Neuro: No headache, no dizziness, no motor weakness.  Skin: No rash Endocrine:  No polydipsia. No polyuria.  Psych: Denies SI/HI  No problems updated.  ALLERGIES: Allergies  Allergen Reactions  . Milk-Related Compounds Nausea And Vomiting  . Poison Ivy Extract [Poison Ivy Extract] Itching, Rash and Other (See Comments)    Muscle damage   . Sulfur Shortness Of Breath and Swelling  . Tobacco [Nicotiana Tabacum] Anaphylaxis and Swelling    Allergic to cigarette smoke  . Orange Juice [Orange Oil] Nausea And Vomiting    PAST MEDICAL HISTORY: Past Medical History:  Diagnosis Date  . Anemia   . Narcolepsy   . Neuropathy   . Obesity   . Pneumonia   . Polyneuropathy in other diseases classified elsewhere ([place])   . Shoulder fracture, left     MEDICATIONS AT HOME: Prior to Admission medications   Medication Sig Start Date End Date Taking? Authorizing Provider  aspirin 81 MG tablet Take 81 mg by mouth daily.   Yes [provider]   Cyanocobalamin 5000 MCG CAPS Take 5,000 mcg by mouth daily. Reported on 10/11/2015 10/11/15  Yes Funches, Adriana Mccallum, MD  Elastic Bandages & Supports (MEDICAL COMPRESSION STOCKINGS) MISC Knee high compression stockings 25-30 mm Hg pressure wear daily 05/04/17  Yes Funches, Josalyn, MD  levothyroxine (SYNTHROID, LEVOTHROID) 100 MCG tablet Take 1 tablet (100 mcg total) by mouth daily before breakfast. 05/05/17  Yes Funches, Josalyn, MD  LUTEIN PO Take 1 tablet by mouth daily.   Yes [provider]  Multiple Vitamins-Minerals (MULTIVITAMIN PO) Take 1 tablet by mouth daily.    Yes [provider]  Multiple Vitamins-Minerals (ZINC PO) Take 1 tablet by mouth daily. Reported on 10/11/2015   Yes [provider]  OVER THE COUNTER MEDICATION Take 1 tablet by mouth daily as needed (cold symtpoms). Reported on 10/11/2015   Yes [provider]  sodium chloride (OCEAN) 0.65 % SOLN nasal spray Place 1 spray into both nostrils as needed for congestion. Reported on 10/11/2015   Yes [provider]     Objective:  EXAM:   Vitals:   04/10/18 0944  BP: 139/69  Pulse: 62  Resp: 18  Temp: 98.7 F (37.1 C)  TempSrc: Oral  SpO2: 97%  Weight: 190 lb (86.2 kg)  Height: 5\' 3"  (1.6 m)    General appearance : A&OX3. NAD. Non-toxic-appearing HEENT: Atraumatic and Normocephalic.  PERRLA. EOM intact.   Neck: supple, no JVD. No cervical lymphadenopathy. No  thyromegaly Chest/Lungs:  Breathing-non-labored, Good air entry bilaterally, breath sounds normal without rales, rhonchi, or wheezing  CVS: S1 S2 regular, no murmurs, gallops, rubs  Extremities: Bilateral Lower Ext shows no edema, both legs are warm to touch with = pulse throughout Neurology:  CN II-XII grossly intact, Non focal.   Psych:  TP linear. J/I WNL. Normal speech. Appropriate eye contact and affect.  Skin:  No Rash  Data Review Lab Results  Component Value Date   HGBA1C 5.4 06/08/2016   HGBA1C 5.5  05/16/2013     Assessment & Plan   1. Hypothyroidism, unspecified type Will send in new Rx when labs are back.  Currently taking 163mcg synthroid daily - Vitamin D, 25-hydroxy - TSH - Comprehensive metabolic panel  2. Weight gain 13 pound fluctuation over the last 5 years-no evidence of recent surge in weight.  Encouraged her to increase her activity, drink more water, fine tune diet.  - TSH - Comprehensive metabolic panel  Patient have been counseled extensively about nutrition and exercise  Return in about 3 months (around 07/11/2018) for Dr [name] for Thyroid f/up.  The patient was given clear instructions to go to ER or return to medical center if symptoms don't improve, worsen or new problems develop. The patient verbalized understanding. The patient was told to call to get lab results if they haven't heard anything in the next week.     Freeman Caldron, PA-C [facility] [place], [place]   04/10/2018, 9:52 AM

## 2018-04-11 ENCOUNTER — Other Ambulatory Visit: Payer: Self-pay | Admitting: Physician Assistant

## 2018-04-11 DIAGNOSIS — E039 Hypothyroidism, unspecified: Secondary | ICD-10-CM

## 2018-04-11 LAB — COMPREHENSIVE METABOLIC PANEL
ALT: 17 IU/L (ref 0–32)
AST: 20 IU/L (ref 0–40)
Albumin/Globulin Ratio: 1.7 (ref 1.2–2.2)
Albumin: 3.8 g/dL (ref 3.5–4.7)
Alkaline Phosphatase: 59 IU/L (ref 39–117)
BUN/Creatinine Ratio: 17 (ref 12–28)
BUN: 16 mg/dL (ref 8–27)
Bilirubin Total: 0.3 mg/dL (ref 0.0–1.2)
CO2: 21 mmol/L (ref 20–29)
Calcium: 9.5 mg/dL (ref 8.7–10.3)
Chloride: 104 mmol/L (ref 96–106)
Creatinine, Ser: 0.94 mg/dL (ref 0.57–1.00)
GFR calc Af Amer: 64 mL/min/{1.73_m2} (ref >59)
GFR calc non Af Amer: 55 mL/min/{1.73_m2} — ABNORMAL LOW (ref >59)
Globulin, Total: 2.3 g/dL (ref 1.5–4.5)
Glucose: 90 mg/dL (ref 65–99)
Potassium: 4.5 mmol/L (ref 3.5–5.2)
Sodium: 140 mmol/L (ref 134–144)
Total Protein: 6.1 g/dL (ref 6.0–8.5)

## 2018-04-11 LAB — TSH: TSH: 3.06 u[IU]/mL (ref 0.450–4.500)

## 2018-04-11 LAB — VITAMIN D 25 HYDROXY (VIT D DEFICIENCY, FRACTURES): Vit D, 25-Hydroxy: 32 ng/mL (ref 30.0–100.0)

## 2018-04-11 MED ORDER — LEVOTHYROXINE SODIUM 100 MCG PO TABS: 100.0000 ug | ORAL_TABLET | Freq: Every day | ORAL | 3 refills | Status: DC

## 2018-04-12 ENCOUNTER — Telehealth: Payer: Self-pay | Admitting: *Deleted

## 2018-04-12 NOTE — Telephone Encounter (Signed)
Medical Assistant left message on patient's home and cell voicemail. Voicemail states to give a call back to [place] with [facility] at [phone]. Patient is aware of labs being normal and vitamin d level being normal .Patient advised to take OTC 2000 units to maintain normal level. Patient is also aware of thyroid dosing remaining the same and no blood indicators to explain for weight gain or fatigue. Follow up as planned.

## 2018-04-12 NOTE — Telephone Encounter (Signed)
-----   Message from [place] Donovan, [place] sent at 04/11/2018  9:13 AM EDT ----- Your labs look great.  Your vitamin D level is normal, so take 2000 units vitamin D daily to maintain a healthy level.  I sent you refills of your thyroid meds and will continue 126mcg daily.  Your labs do not show anything abnormal as far as weight gain or fatigue.  Follow-up as planned.

## 2019-01-29 ENCOUNTER — Other Ambulatory Visit: Payer: Self-pay

## 2019-01-29 ENCOUNTER — Ambulatory Visit: Payer: Medicaid Other | Attending: Family Medicine | Admitting: Family Medicine

## 2019-01-29 ENCOUNTER — Encounter: Payer: Self-pay | Admitting: Family Medicine

## 2019-01-29 DIAGNOSIS — M25561 Pain in right knee: Secondary | ICD-10-CM | POA: Diagnosis not present

## 2019-01-29 MED ORDER — NAPROXEN 500 MG PO TABS: 500.0000 mg | ORAL_TABLET | Freq: Two times a day (BID) | ORAL | 0 refills | Status: DC | PRN

## 2019-01-29 NOTE — Progress Notes (Signed)
Patient has been called and DOB has been verified. Patient has been screened and transferred to PCP to start phone visit.  C/C: right knee pain.

## 2019-01-29 NOTE — Progress Notes (Signed)
Virtual Visit via Telephone Note  I connected with Krystal Moore on 01/29/19 at  1:50 PM EDT by telephone and verified that I am speaking with the correct person using two identifiers.   I discussed the limitations, risks, security and privacy concerns of performing an evaluation and management service by telephone and the availability of in person appointments. I also discussed with the patient that there may be a patient responsible charge related to this service. The patient expressed understanding and agreed to proceed.   History of Present Illness: 85 year old female with a history of hypothyroidism who presents today with right medial knee pain rated as a 7/10 for the last 4 days.  She describes it as pain on the lower inner aspect of her right knee at the point where her knee bends.  She denies trauma, swelling, bruising.  Pain is severe to the point that she finds it difficult to bear weight. She endorses playing the organ every day and does a lot of leg work with her right leg. Use of OTC acetaminophen reduces the pain to a mild dull ache.   Observations/Objective: Awake, alert, oriented x3  Assessment and Plan: 1. Acute pain of right knee Likely inflammation from repetitive motion while playing the organ Advised to apply ice, rest, elevate and use knee brace if needed Hold off on playing the organ for the next couple of days - naproxen (NAPROSYN) 500 MG tablet; Take 1 tablet (500 mg total) by mouth 2 (two) times daily as needed.  Dispense: 30 tablet; Refill: 0   Follow Up Instructions: Return for Follow-up of chronic medical conditions, keep previously scheduled appointment with PCP.    I discussed the assessment and treatment plan with the patient. The patient was provided an opportunity to ask questions and all were answered. The patient agreed with the plan and demonstrated an understanding of the instructions.   The patient was advised to call back or seek an  in-person evaluation if the symptoms worsen or if the condition fails to improve as anticipated.  I provided 11 minutes of non-face-to-face time during this encounter.   Charlott Rakes, MD

## 2019-02-10 ENCOUNTER — Other Ambulatory Visit: Payer: Self-pay | Admitting: Family Medicine

## 2019-02-10 DIAGNOSIS — M25561 Pain in right knee: Secondary | ICD-10-CM

## 2019-02-18 ENCOUNTER — Telehealth: Payer: Self-pay | Admitting: Internal Medicine

## 2019-02-18 NOTE — Telephone Encounter (Signed)
Luke could you take care of this please  °

## 2019-02-18 NOTE — Telephone Encounter (Signed)
Patient called stating she needs clearer information for her naproxen. Please follow up.

## 2019-02-18 NOTE — Telephone Encounter (Signed)
Patient was asking if she could take her evening dose before bedtime. Told her that this was okay to do.  Recommended for her to eat something before taking. Additionally, I encouraged her to stay hydrated.

## 2019-02-19 ENCOUNTER — Other Ambulatory Visit: Payer: Self-pay

## 2019-02-19 ENCOUNTER — Ambulatory Visit: Payer: Medicaid Other | Attending: Internal Medicine | Admitting: Physician Assistant

## 2019-02-19 DIAGNOSIS — M62838 Other muscle spasm: Secondary | ICD-10-CM

## 2019-02-19 MED ORDER — METHOCARBAMOL 500 MG PO TABS: 500.0000 mg | ORAL_TABLET | Freq: Three times a day (TID) | ORAL | 0 refills | Status: DC | PRN

## 2019-02-19 NOTE — Progress Notes (Signed)
Right leg pain- Pain 6/10- dull ache presently  Inside next to knee feels like one hard long lump, swollen And like its about to go into a spasm  Uses heat to area

## 2019-02-19 NOTE — Progress Notes (Signed)
Patient ID: Krystal Moore, female   DOB: May 10, 1930, 85 y.o.   MRN: [id number] Virtual Visit via Telephone Note  I connected with Krystal Moore on 02/19/19 at  3:50 PM EDT by telephone and verified that I am speaking with the correct person using two identifiers.   I discussed the limitations, risks, security and privacy concerns of performing an evaluation and management service by telephone and the availability of in person appointments. I also discussed with the patient that there may be a patient responsible charge related to this service. The patient expressed understanding and agreed to proceed.  Patient location:  home My Location:  [place] office Persons on the call:  Myself and the patient   History of Present Illness: Inside R knee down to ankle, feels like a long lump/muscle spasm. Bothering her X 2 weeks.  Naproxen helping some.  Sleeping ok.  Bearing weight on it hurts.  No discoloration.  Feels like a cramp/pull.  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  Plays daily. No CP.  No SOB.  No h/o blood clot.  Heat helps some.  No swelling or redness of the back of the leg.      Observations/Objective: A&OX3.  TP linear.  Speech is clear.     Assessment and Plan: 1. Muscle spasm Continue naproxen.  Relative rest/no organ playing for at least a week.  Alternate heat and ice.  To ED if signs of clot develop - methocarbamol (ROBAXIN) 500 MG tablet; Take 1 tablet (500 mg total) by mouth every 8 (eight) hours as needed for muscle spasms.  Dispense: 60 tablet; Refill: 0 -temporary handicap placard  Follow Up Instructions: F/up prn    I discussed the assessment and treatment plan with the patient. The patient was provided an opportunity to ask questions and all were answered. The patient agreed with the plan and demonstrated an understanding of the instructions.   The patient was advised to call back or seek an in-person evaluation if the symptoms worsen or if the condition  fails to improve as anticipated.  I provided 15 minutes of non-face-to-face time during this encounter.   Freeman Caldron, PA-C

## 2019-02-22 ENCOUNTER — Other Ambulatory Visit: Payer: Self-pay | Admitting: Family Medicine

## 2019-02-22 DIAGNOSIS — M25561 Pain in right knee: Secondary | ICD-10-CM

## 2019-02-23 ENCOUNTER — Other Ambulatory Visit: Payer: Self-pay | Admitting: Family Medicine

## 2019-02-23 DIAGNOSIS — M25561 Pain in right knee: Secondary | ICD-10-CM

## 2019-02-24 ENCOUNTER — Other Ambulatory Visit: Payer: Self-pay | Admitting: Family Medicine

## 2019-02-24 ENCOUNTER — Telehealth: Payer: Self-pay | Admitting: Internal Medicine

## 2019-02-24 DIAGNOSIS — M25561 Pain in right knee: Principal | ICD-10-CM

## 2019-02-24 DIAGNOSIS — G8929 Other chronic pain: Secondary | ICD-10-CM

## 2019-02-24 NOTE — Telephone Encounter (Signed)
Will route to PCP for review. 

## 2019-02-24 NOTE — Telephone Encounter (Signed)
New Message  Pt states she spoke with [facility] and they informed her that she would need a referral to be seen. Please f/u

## 2019-02-25 NOTE — Telephone Encounter (Signed)
Referral has been placed. 

## 2019-02-28 ENCOUNTER — Other Ambulatory Visit: Payer: Self-pay | Admitting: Family Medicine

## 2019-02-28 DIAGNOSIS — M25561 Pain in right knee: Secondary | ICD-10-CM

## 2019-03-30 ENCOUNTER — Other Ambulatory Visit: Payer: Self-pay | Admitting: Physician Assistant

## 2019-03-30 DIAGNOSIS — E039 Hypothyroidism, unspecified: Secondary | ICD-10-CM

## 2019-03-31 ENCOUNTER — Other Ambulatory Visit: Payer: Self-pay | Admitting: Internal Medicine

## 2019-03-31 DIAGNOSIS — E039 Hypothyroidism, unspecified: Secondary | ICD-10-CM

## 2019-04-28 ENCOUNTER — Other Ambulatory Visit: Payer: Self-pay | Admitting: Internal Medicine

## 2019-04-28 DIAGNOSIS — E039 Hypothyroidism, unspecified: Secondary | ICD-10-CM

## 2019-05-01 ENCOUNTER — Telehealth: Payer: Self-pay | Admitting: Internal Medicine

## 2019-05-01 NOTE — Telephone Encounter (Signed)
1) Medication(s) Requested (by name): levothyroxine (SYNTHROID) 100 MCG tablet [[id number]]    2) Pharmacy of Choice: [facility] [place]   3) Special Requests: 90 tablet    Approved medications will be sent to the pharmacy, we will reach out if there is an issue.  Requests made after 3pm may not be addressed until the following business day!  If a patient is unsure of the name of the medication(s) please note and ask patient to call back when they are able to provide all info, do not send to responsible party until all information is available!

## 2019-05-06 NOTE — Telephone Encounter (Signed)
Per pcp pt needs labwork prior to this being refilled,

## 2019-05-08 ENCOUNTER — Other Ambulatory Visit: Payer: Self-pay

## 2019-05-08 ENCOUNTER — Other Ambulatory Visit: Payer: Self-pay | Admitting: Internal Medicine

## 2019-05-08 ENCOUNTER — Other Ambulatory Visit: Payer: PPO

## 2019-05-08 DIAGNOSIS — E039 Hypothyroidism, unspecified: Secondary | ICD-10-CM

## 2019-05-08 MED ORDER — LEVOTHYROXINE SODIUM 100 MCG PO TABS: 100.0000 ug | ORAL_TABLET | Freq: Every day | ORAL | 0 refills | Status: DC

## 2019-05-09 ENCOUNTER — Other Ambulatory Visit: Payer: PPO

## 2019-05-29 ENCOUNTER — Other Ambulatory Visit: Payer: Self-pay

## 2019-05-29 ENCOUNTER — Encounter: Payer: Self-pay | Admitting: Internal Medicine

## 2019-05-29 ENCOUNTER — Ambulatory Visit: Payer: PPO | Attending: Internal Medicine | Admitting: Internal Medicine

## 2019-05-29 VITALS — BP 149/83 | HR 49 | Temp 98.3°F | Resp 16 | Ht 64.0 in | Wt 173.2 lb

## 2019-05-29 DIAGNOSIS — Z780 Asymptomatic menopausal state: Secondary | ICD-10-CM | POA: Insufficient documentation

## 2019-05-29 DIAGNOSIS — R030 Elevated blood-pressure reading, without diagnosis of hypertension: Secondary | ICD-10-CM | POA: Insufficient documentation

## 2019-05-29 DIAGNOSIS — Z8639 Personal history of other endocrine, nutritional and metabolic disease: Secondary | ICD-10-CM

## 2019-05-29 DIAGNOSIS — E663 Overweight: Secondary | ICD-10-CM | POA: Insufficient documentation

## 2019-05-29 DIAGNOSIS — E039 Hypothyroidism, unspecified: Secondary | ICD-10-CM

## 2019-05-29 DIAGNOSIS — H52209 Unspecified astigmatism, unspecified eye: Secondary | ICD-10-CM

## 2019-05-29 DIAGNOSIS — M8580 Other specified disorders of bone density and structure, unspecified site: Secondary | ICD-10-CM

## 2019-05-29 DIAGNOSIS — R200 Anesthesia of skin: Secondary | ICD-10-CM

## 2019-05-29 DIAGNOSIS — R600 Localized edema: Secondary | ICD-10-CM

## 2019-05-29 MED ORDER — HYDROCHLOROTHIAZIDE 12.5 MG PO TABS: ORAL_TABLET | ORAL | 3 refills | Status: DC

## 2019-05-29 MED ORDER — ALENDRONATE SODIUM 35 MG PO TABS: 35.0000 mg | ORAL_TABLET | ORAL | 6 refills | Status: DC

## 2019-05-29 NOTE — Progress Notes (Signed)
Patient ID: Krystal Moore, female    DOB: 1929/11/11  MRN: [id number]  CC: Hypothyroidism   Subjective: Krystal Moore is a 85 y.o. female who presents for chronic ds management Her concerns today include:  Hx of osteopenia, hypothyroid, obesity, Vit B12 def  Obesity:  Loss 17 lbs in last yr and she is pleased about this -attributes this to being more active - walks.  Plays piano 4 hrs daily -eating about same amount  C/o numbness in both feet "as if I have and invisible band around both feet."  Points to mid foot.   -constant x several mths Hx of B12 def dx in 1980s at [facility] and use to get inj.  Now takes Vit B12 2500 units daily  RT knee pain:  Saw ortho and had jt inj which was very helpful   Osteopenia:  I was unable to reach her via phone to discuss results of bone density study that was done the early part of last year.  Letter sent.  Did not receive.  She had osteopenia with a T score of -2.1 at the femoral head.  Calculated FRAX score was 5.5% at the hip and 15% for major osteoporotic fracture.  I had recommended calcium and vitamin D supplement and starting Fosamax 35 mg once a week to help prevent osteoporosis.  However, patient never received the letter -[place] 1 mth ago.  Leash got caught around her legs when walking her dog.  Fell in grass.  Had painful mass LT elbow for wks.   -She does not smoke  -No family history of osteoporosis  -She drinks 1/2 glass wine 2 x a wk.  Hypothyroid:  Compliant with levothyroxine.  Denies any constipation or diarrhea.  No palpitations.  Problems seeing things close up. One day she had problems reading the signs [place] -dx with astigmatism yrs ago. Wears glasses to read music when she plays piano. -no eye exam in yrs  Patient Active Problem List   Diagnosis Date Noted  . Immunization due 08/03/2017  . Left leg swelling 05/04/2017  . Obesity (BMI 30.0-34.9) 06/08/2016  . Allergic rhinitis  10/11/2015  . Hypothyroidism 12/10/2013  . Peripheral neuropathy 05/16/2013     Current Outpatient Medications on File Prior to Visit  Medication Sig Dispense Refill  . aspirin 81 MG tablet Take 81 mg by mouth daily.    . Cyanocobalamin 5000 MCG CAPS Take 5,000 mcg by mouth daily. Reported on 10/11/2015 (Patient not taking: Reported on 05/29/2019) 30 capsule 11  . Elastic Bandages & Supports (MEDICAL COMPRESSION STOCKINGS) MISC Knee high compression stockings 25-30 mm Hg pressure wear daily 4 each 0  . levothyroxine (SYNTHROID) 100 MCG tablet Take 1 tablet (100 mcg total) by mouth daily before breakfast. 30 tablet 0  . LUTEIN PO Take 1 tablet by mouth daily.    . methocarbamol (ROBAXIN) 500 MG tablet Take 1 tablet (500 mg total) by mouth every 8 (eight) hours as needed for muscle spasms. (Patient not taking: Reported on 05/29/2019) 60 tablet 0  . Multiple Vitamins-Minerals (MULTIVITAMIN PO) Take 1 tablet by mouth daily.     . Multiple Vitamins-Minerals (ZINC PO) Take 1 tablet by mouth daily. Reported on 10/11/2015    . naproxen (NAPROSYN) 500 MG tablet TAKE 1 TABLET BY MOUTH TWICE DAILY AS NEEDED (Patient not taking: Reported on 05/29/2019) 60 tablet 0  . OVER THE COUNTER MEDICATION Take 1 tablet by mouth daily as needed (cold symtpoms).  Reported on 10/11/2015    . sodium chloride (OCEAN) 0.65 % SOLN nasal spray Place 1 spray into both nostrils as needed for congestion. Reported on 10/11/2015     No current facility-administered medications on file prior to visit.     Allergies  Allergen Reactions  . Milk-Related Compounds Nausea And Vomiting  . Poison Ivy Extract [Poison Ivy Extract] Itching, Rash and Other (See Comments)    Muscle damage   . Sulfur Shortness Of Breath and Swelling  . Tobacco [Nicotiana Tabacum] Anaphylaxis and Swelling    Allergic to cigarette smoke  . Orange Juice [Orange Oil] Nausea And Vomiting    Social History   Socioeconomic History  . Marital status: Widowed     Spouse name: Not on file  . Number of children: 0  . Years of education: college  . Highest education level: Not on file  Occupational History  . Not on file  Social Needs  . Financial resource strain: Not on file  . Food insecurity    Worry: Not on file    Inability: Not on file  . Transportation needs    Medical: Not on file    Non-medical: Not on file  Tobacco Use  . Smoking status: Never Smoker  . Smokeless tobacco: Never Used  Substance and Sexual Activity  . Alcohol use: Yes    Comment: 1/4 glass per day  . Drug use: No  . Sexual activity: Not Currently  Lifestyle  . Physical activity    Days per week: Not on file    Minutes per session: Not on file  . Stress: Not on file  Relationships  . Social Herbalist on phone: Not on file    Gets together: Not on file    Attends religious service: Not on file    Active member of club or organization: Not on file    Attends meetings of clubs or organizations: Not on file    Relationship status: Not on file  . Intimate partner violence    Fear of current or ex partner: Not on file    Emotionally abused: Not on file    Physically abused: Not on file    Forced sexual activity: Not on file  Other Topics Concern  . Not on file  Social History Narrative   Patient lives at home and she has a roommate.    Has a poodle.    Patient works part time Advertising copywriter.   Patient has a college education.   Both handed. Plays organ since age 73.    Caffeine- None     Family History  Problem Relation Age of Onset  . Stroke Mother        questionable  . Clotting disorder Mother   . Heart attack Father     Past Surgical History:  Procedure Laterality Date  . MOUTH SURGERY    . NASAL SINUS SURGERY      ROS: Review of Systems Negative except as stated above  PHYSICAL EXAM: BP (!) 149/83   Pulse (!) 49   Temp 98.3 F (36.8 C) (Oral)   Resp 16   Ht 5\' 4"  (1.626 m)   Wt 173 lb 3.2 oz (78.6 kg)   SpO2 95%    BMI 29.73 kg/m   Wt Readings from Last 3 Encounters:  05/29/19 173 lb 3.2 oz (78.6 kg)  04/10/18 190 lb (86.2 kg)  08/03/17 191 lb 6.4 oz (86.8 kg)  149/83 RT,  P 56.  154/77 L  Physical Exam General appearance - alert, well appearing, elderly Caucasian female and in no distress Mental status - normal mood, behavior, speech, dress, motor activity, and thought processes Eyes - pupils equal and reactive, extraocular eye movements intact Nose - normal and patent, no erythema, discharge or polyps Mouth - mucous membranes moist, pharynx normal without lesions Neck - supple, no significant adenopathy Chest - clear to auscultation, no wheezes, rales or rhonchi, symmetric air entry Heart - normal rate, regular rhythm, normal S1, S2, no murmurs, rubs, clicks or gallops Neurological -power in lower extremities 5/5 bilaterally.  Gross sensation intact on the dorsal and plantar surface of the feet Extremities -trace to 1+ bilateral lower extremity edema.  Good dorsalis pedis and posterior tibialis pulses.  Feet with hyperemic hue  Depression screen [facility] 2/9 05/29/2019 02/19/2019 04/10/2018  Decreased Interest 0 0 0  Down, Depressed, Hopeless 0 0 0  PHQ - 2 Score 0 0 0  Altered sleeping - 0 -  Tired, decreased energy - 0 -  Change in appetite - 0 -  Feeling bad or failure about yourself  - 0 -  Trouble concentrating - 0 -  Moving slowly or fidgety/restless - 0 -  Suicidal thoughts - 0 -  PHQ-9 Score - 0 -   GAD 7 : Generalized Anxiety Score 05/29/2019 02/19/2019 04/10/2018 08/03/2017  Nervous, Anxious, on Edge 0 0 0 0  Control/stop worrying 0 0 0 0  Worry too much - different things 0 0 0 0  Trouble relaxing 0 0 0 0  Restless 0 0 0 0  Easily annoyed or irritable 0 0 0 0  Afraid - awful might happen 0 0 0 0  Total GAD 7 Score 0 0 0 0       CMP Latest Ref Rng & Units 04/10/2018 08/03/2017 03/22/2017  Glucose 65 - 99 mg/dL 90 90 93  BUN 8 - 27 mg/dL 16 14 18   Creatinine 0.57 - 1.00 mg/dL 0.94  0.96 0.87  Sodium 134 - 144 mmol/L 140 138 138  Potassium 3.5 - 5.2 mmol/L 4.5 4.4 4.6  Chloride 96 - 106 mmol/L 104 102 104  CO2 20 - 29 mmol/L 21 23 22   Calcium 8.7 - 10.3 mg/dL 9.5 9.4 9.0  Total Protein 6.0 - 8.5 g/dL 6.1 6.3 6.8  Total Bilirubin 0.0 - 1.2 mg/dL 0.3 0.4 0.5  Alkaline Phos 39 - 117 IU/L 59 61 65  AST 0 - 40 IU/L 20 26 22   ALT 0 - 32 IU/L 17 25 14    Lipid Panel     Component Value Date/Time   CHOL 218 (H) 06/08/2016 1155   TRIG 98 06/08/2016 1155   HDL 70 06/08/2016 1155   CHOLHDL 3.1 06/08/2016 1155   VLDL 20 06/08/2016 1155   LDLCALC 128 06/08/2016 1155    CBC    Component Value Date/Time   WBC 4.1 03/22/2017 0941   WBC 4.6 06/08/2016 1155   RBC 4.55 03/22/2017 0941   RBC 4.60 06/08/2016 1155   HGB 13.5 03/22/2017 0941   HGB 13.5 06/07/2015 1240   HCT 41.6 03/22/2017 0941   HCT 41.0 06/07/2015 1240   PLT 267 03/22/2017 0941   MCV 91 03/22/2017 0941   MCV 91.9 06/07/2015 1240   MCH 29.7 03/22/2017 0941   MCH 30.7 06/08/2016 1155   MCHC 32.5 03/22/2017 0941   MCHC 33.9 06/08/2016 1155   RDW 14.3 03/22/2017 0941   RDW 13.6 06/07/2015 1240  LYMPHSABS 1.5 06/07/2015 1240   MONOABS 0.4 06/07/2015 1240   EOSABS 0.2 06/07/2015 1240   BASOSABS 0.1 06/07/2015 1240    ASSESSMENT AND PLAN: 1. Over weight Commended her on weight loss.  Encouraged her to stay active and to eat healthy. - CBC - Comprehensive metabolic panel - Lipid panel - Hemoglobin A1c  2. Osteopenia after menopause -I recommend taking vitamin D 800 units daily and calcium 600 mg twice a day.  This can be purchased over-the-counter.  Written instructions given. -Because she is at increased risk for fracture I recommend starting Fosamax to take once a week.  I went over possible side effects of the medication especially gastrointestinal upset due to gastritis or acid reflux.  Advised patient to sit upright and take the medication first thing in the morning with an 8 ounce glass of  water and not to eat or take other medications for at least 1 hour after taking this medicine.  She expressed understanding and was able to repeat instructions back to me - alendronate (FOSAMAX) 35 MG tablet; Take 1 tablet (35 mg total) by mouth every 7 (seven) days. Take with a full glass of water on an empty stomach. Do not eat or take other meds for at least 1 hr after.  Dispense: 4 tablet; Refill: 6  3. Numbness in feet We will check vitamin B12 level and A1c to screen for diabetes.  We will also check TSH  4. Hypothyroidism, unspecified type - TSH  5. Astigmatism, unspecified laterality, unspecified type - Ambulatory referral to Ophthalmology  6. History of non anemic vitamin B12 deficiency - Vitamin B12  7. Elevated blood pressure reading DASH diet discussed and encouraged.  She will follow-up in 1 week with the clinical pharmacist for blood pressure recheck.  Of note I have placed her on HCTZ to use as needed for lower extremity edema.  If blood pressure is not at goal on her follow-up visit with the clinical pharmacist, I recommend having her take the low-dose hydrochlorothiazide every day. - CBC - Comprehensive metabolic panel - Lipid panel  8. Edema leg  - hydrochlorothiazide (HYDRODIURIL) 12.5 MG tablet; 1 tab PO daily as needed for lower extremity swelling  Dispense: 30 tablet; Refill: 3     Patient was given the opportunity to ask questions.  Patient verbalized understanding of the plan and was able to repeat key elements of the plan.   Orders Placed This Encounter  Procedures  . CBC  . Comprehensive metabolic panel  . Lipid panel  . TSH  . Vitamin B12  . Hemoglobin A1c  . Ambulatory referral to Ophthalmology     Requested Prescriptions   Signed Prescriptions Disp Refills  . alendronate (FOSAMAX) 35 MG tablet 4 tablet 6    Sig: Take 1 tablet (35 mg total) by mouth every 7 (seven) days. Take with a full glass of water on an empty stomach. Do not eat or take  other meds for at least 1 hr after.  . hydrochlorothiazide (HYDRODIURIL) 12.5 MG tablet 30 tablet 3    Sig: 1 tab PO daily as needed for lower extremity swelling    Return in about 4 months (around 09/28/2019).  Karle Plumber, MD, FACP

## 2019-05-29 NOTE — Patient Instructions (Addendum)
Please give patient an appointment with the clinical pharmacist in 1 week for repeat blood pressure check.  You need to take vitamin D 800 IU daily.  You need to take calcium 600 mg twice a day. Start the medication called Fosamax which she will take once a week for the osteoporosis.  Take the fluid pill hydrochlorothiazide as needed for lower extremity swelling.   Osteopenia  Osteopenia is a loss of thickness (density) inside of the bones. Another name for osteopenia is low bone mass. Mild osteopenia is a normal part of aging. It is not a disease, and it does not cause symptoms. However, if you have osteopenia and continue to lose bone mass, you could develop a condition that causes the bones to become thin and break more easily (osteoporosis). You may also lose some height, have back pain, and have a stooped posture. Although osteopenia is not a disease, making changes to your lifestyle and diet can help to prevent osteopenia from developing into osteoporosis. What are the causes? Osteopenia is caused by loss of calcium in the bones.  Bones are constantly changing. Old bone cells are continually being replaced with new bone cells. This process builds new bone. The mineral calcium is needed to build new bone and maintain bone density. Bone density is usually highest around age 103. After that, most people's bodies cannot replace all the bone they have lost with new bone. What increases the risk? You are more likely to develop this condition if:  You are older than age 25.  You are a woman who went through menopause early.  You have a long illness that keeps you in bed.  You do not get enough exercise.  You lack certain nutrients (malnutrition).  You have an overactive thyroid gland (hyperthyroidism).  You smoke.  You drink a lot of alcohol.  You are taking medicines that weaken the bones, such as steroids. What are the signs or symptoms? This condition does not cause any symptoms.  You may have a slightly higher risk for bone breaks (fractures), so getting fractures more easily than normal may be an indication of osteopenia. How is this diagnosed? Your health care provider can diagnose this condition with a special type of X-ray exam that measures bone density (dual-energy X-ray absorptiometry, DEXA). This test can measure bone density in your hips, spine, and wrists. Osteopenia has no symptoms, so this condition is usually diagnosed after a routine bone density screening test is done for osteoporosis. This routine screening is usually done for:  Women who are age 41 or older.  Men who are age 53 or older. If you have risk factors for osteopenia, you may have the screening test at an earlier age. How is this treated? Making dietary and lifestyle changes can lower your risk for osteoporosis. If you have severe osteopenia that is close to becoming osteoporosis, your health care provider may prescribe medicines and dietary supplements such as calcium and vitamin D. These supplements help to rebuild bone density. Follow these instructions at home:   Take over-the-counter and prescription medicines only as told by your health care provider. These include vitamins and supplements.  Eat a diet that is high in calcium and vitamin D. ? Calcium is found in dairy products, beans, salmon, and leafy green vegetables like spinach and broccoli. ? Look for foods that have vitamin D and calcium added to them (fortified foods), such as orange juice, cereal, and bread.  Do 30 or more minutes of a weight-bearing exercise  every day, such as walking, jogging, or playing a sport. These types of exercises strengthen the bones.  Take precautions at home to lower your risk of falling, such as: ? Keeping rooms well-lit and free of clutter, such as cords. ? Installing safety rails on stairs. ? Using rubber mats in the bathroom or other areas that are often wet or slippery.  Do not use any  products that contain nicotine or tobacco, such as cigarettes and e-cigarettes. If you need help quitting, ask your health care provider.  Avoid alcohol or limit alcohol intake to no more than 1 drink a day for nonpregnant women and 2 drinks a day for men. One drink equals 12 oz of beer, 5 oz of wine, or 1 oz of hard liquor.  Keep all follow-up visits as told by your health care provider. This is important. Contact a health care provider if:  You have not had a bone density screening for osteoporosis and you are: ? A woman, age 78 or older. ? A man, age 66 or older.  You are a postmenopausal woman who has not had a bone density screening for osteoporosis.  You are older than age 28 and you want to know if you should have bone density screening for osteoporosis. Summary  Osteopenia is a loss of thickness (density) inside of the bones. Another name for osteopenia is low bone mass.  Osteopenia is not a disease, but it may increase your risk for a condition that causes the bones to become thin and break more easily (osteoporosis).  You may be at risk for osteopenia if you are older than age 28 or if you are a woman who went through early menopause.  Osteopenia does not cause any symptoms, but it can be diagnosed with a bone density screening test.  Dietary and lifestyle changes are the first treatment for osteopenia. These may lower your risk for osteoporosis. This information is not intended to replace advice given to you by your health care provider. Make sure you discuss any questions you have with your health care provider. Document Released: 07/18/2017 Document Revised: 09/21/2017 Document Reviewed: 07/18/2017 Elsevier Patient Education  2020 [place] in the Home, Adult Falls can cause injuries. They can happen to people of all ages. There are many things you can do to make your home safe and to help prevent falls. Ask for help when making these changes, if  needed. What actions can I take to prevent falls? General Instructions  Use good lighting in all rooms. Replace any light bulbs that burn out.  Turn on the lights when you go into a dark area. Use night-lights.  Keep items that you use often in easy-to-reach places. Lower the shelves around your home if necessary.  Set up your furniture so you have a clear path. Avoid moving your furniture around.  Do not have throw rugs and other things on the floor that can make you trip.  Avoid walking on wet floors.  If any of your floors are uneven, fix them.  Add color or contrast paint or tape to clearly mark and help you see: ? Any grab bars or handrails. ? First and last steps of stairways. ? Where the edge of each step is.  If you use a stepladder: ? Make sure that it is fully opened. Do not climb a closed stepladder. ? Make sure that both sides of the stepladder are locked into place. ? Ask someone to hold the stepladder  for you while you use it.  If there are any pets around you, be aware of where they are. What can I do in the bathroom?      Keep the floor dry. Clean up any water that spills onto the floor as soon as it happens.  Remove soap buildup in the tub or shower regularly.  Use non-skid mats or decals on the floor of the tub or shower.  Attach bath mats securely with double-sided, non-slip rug tape.  If you need to sit down in the shower, use a plastic, non-slip stool.  Install grab bars by the toilet and in the tub and shower. Do not use towel bars as grab bars. What can I do in the bedroom?  Make sure that you have a light by your bed that is easy to reach.  Do not use any sheets or blankets that are too big for your bed. They should not hang down onto the floor.  Have a firm chair that has side arms. You can use this for support while you get dressed. What can I do in the kitchen?  Clean up any spills right away.  If you need to reach something above  you, use a strong step stool that has a grab bar.  Keep electrical cords out of the way.  Do not use floor polish or wax that makes floors slippery. If you must use wax, use non-skid floor wax. What can I do with my stairs?  Do not leave any items on the stairs.  Make sure that you have a light switch at the top of the stairs and the bottom of the stairs. If you do not have them, ask someone to add them for you.  Make sure that there are handrails on both sides of the stairs, and use them. Fix handrails that are broken or loose. Make sure that handrails are as long as the stairways.  Install non-slip stair treads on all stairs in your home.  Avoid having throw rugs at the top or bottom of the stairs. If you do have throw rugs, attach them to the floor with carpet tape.  Choose a carpet that does not hide the edge of the steps on the stairway.  Check any carpeting to make sure that it is firmly attached to the stairs. Fix any carpet that is loose or worn. What can I do on the outside of my home?  Use bright outdoor lighting.  Regularly fix the edges of walkways and driveways and fix any cracks.  Remove anything that might make you trip as you walk through a door, such as a raised step or threshold.  Trim any bushes or trees on the path to your home.  Regularly check to see if handrails are loose or broken. Make sure that both sides of any steps have handrails.  Install guardrails along the edges of any raised decks and porches.  Clear walking paths of anything that might make someone trip, such as tools or rocks.  Have any leaves, snow, or ice cleared regularly.  Use sand or salt on walking paths during winter.  Clean up any spills in your garage right away. This includes grease or oil spills. What other actions can I take?  Wear shoes that: ? Have a low heel. Do not wear high heels. ? Have rubber bottoms. ? Are comfortable and fit you well. ? Are closed at the toe. Do  not wear open-toe sandals.  Use tools that  help you move around (mobility aids) if they are needed. These include: ? Canes. ? Walkers. ? Scooters. ? Crutches.  Review your medicines with your doctor. Some medicines can make you feel dizzy. This can increase your chance of falling. Ask your doctor what other things you can do to help prevent falls. Where to find more information  Centers for Disease Control and Prevention, STEADI: https://garcia.biz/  [employer] on Aging: BrainJudge.co.uk Contact a doctor if:  You are afraid of falling at home.  You feel weak, drowsy, or dizzy at home.  You fall at home. Summary  There are many simple things that you can do to make your home safe and to help prevent falls.  Ways to make your home safe include removing tripping hazards and installing grab bars in the bathroom.  Ask for help when making these changes in your home. This information is not intended to replace advice given to you by your health care provider. Make sure you discuss any questions you have with your health care provider. Document Released: 08/05/2009 Document Revised: 01/30/2019 Document Reviewed: 05/24/2017 Elsevier Patient Education  2020 [employer].

## 2019-05-30 ENCOUNTER — Other Ambulatory Visit: Payer: Self-pay | Admitting: Internal Medicine

## 2019-05-30 DIAGNOSIS — R200 Anesthesia of skin: Secondary | ICD-10-CM

## 2019-05-30 LAB — COMPREHENSIVE METABOLIC PANEL
ALT: 19 IU/L (ref 0–32)
AST: 21 IU/L (ref 0–40)
Albumin/Globulin Ratio: 2.2 (ref 1.2–2.2)
Albumin: 4.3 g/dL (ref 3.6–4.6)
Alkaline Phosphatase: 66 IU/L (ref 39–117)
BUN/Creatinine Ratio: 13 (ref 12–28)
BUN: 12 mg/dL (ref 8–27)
Bilirubin Total: 0.5 mg/dL (ref 0.0–1.2)
CO2: 22 mmol/L (ref 20–29)
Calcium: 9.4 mg/dL (ref 8.7–10.3)
Chloride: 102 mmol/L (ref 96–106)
Creatinine, Ser: 0.91 mg/dL (ref 0.57–1.00)
GFR calc Af Amer: 66 mL/min/{1.73_m2} (ref >59)
GFR calc non Af Amer: 57 mL/min/{1.73_m2} — ABNORMAL LOW (ref >59)
Globulin, Total: 2 g/dL (ref 1.5–4.5)
Glucose: 97 mg/dL (ref 65–99)
Potassium: 4.4 mmol/L (ref 3.5–5.2)
Sodium: 138 mmol/L (ref 134–144)
Total Protein: 6.3 g/dL (ref 6.0–8.5)

## 2019-05-30 LAB — TSH: TSH: 2.87 u[IU]/mL (ref 0.450–4.500)

## 2019-05-30 LAB — CBC
Hematocrit: 41 % (ref 34.0–46.6)
Hemoglobin: 14 g/dL (ref 11.1–15.9)
MCH: 31.4 pg (ref 26.6–33.0)
MCHC: 34.1 g/dL (ref 31.5–35.7)
MCV: 92 fL (ref 79–97)
Platelets: 238 10*3/uL (ref 150–450)
RBC: 4.46 x10E6/uL (ref 3.77–5.28)
RDW: 12.2 % (ref 11.7–15.4)
WBC: 5.1 10*3/uL (ref 3.4–10.8)

## 2019-05-30 LAB — LIPID PANEL
Chol/HDL Ratio: 2.6 ratio (ref 0.0–4.4)
Cholesterol, Total: 233 mg/dL — ABNORMAL HIGH (ref 100–199)
HDL: 90 mg/dL (ref >39)
LDL Calculated: 132 mg/dL — ABNORMAL HIGH (ref 0–99)
Triglycerides: 55 mg/dL (ref 0–149)
VLDL Cholesterol Cal: 11 mg/dL (ref 5–40)

## 2019-05-30 LAB — HEMOGLOBIN A1C
Est. average glucose Bld gHb Est-mCnc: 105 mg/dL
Hgb A1c MFr Bld: 5.3 % (ref 4.8–5.6)

## 2019-05-30 LAB — VITAMIN B12: Vitamin B-12: >2000 pg/mL — ABNORMAL HIGH (ref 232–1245)

## 2019-06-05 ENCOUNTER — Other Ambulatory Visit: Payer: Self-pay | Admitting: Internal Medicine

## 2019-06-05 ENCOUNTER — Ambulatory Visit: Payer: PPO | Attending: Internal Medicine | Admitting: Pharmacist

## 2019-06-05 ENCOUNTER — Other Ambulatory Visit: Payer: Self-pay

## 2019-06-05 ENCOUNTER — Encounter: Payer: Self-pay | Admitting: Pharmacist

## 2019-06-05 VITALS — BP 116/75 | HR 65

## 2019-06-05 DIAGNOSIS — E039 Hypothyroidism, unspecified: Secondary | ICD-10-CM

## 2019-06-05 DIAGNOSIS — R030 Elevated blood-pressure reading, without diagnosis of hypertension: Secondary | ICD-10-CM

## 2019-06-05 NOTE — Patient Instructions (Signed)
Thank you for coming to see [place] today.   Blood pressure today is well-controlled.  Continue taking HCTZ as prescribed for lower leg swelling.  Limiting salt and caffeine, as well as exercising as able for at least 30 minutes for 5 days out of the week, can also help you lower your blood pressure.  Take your blood pressure at home if you are able. Please write down these numbers and bring them to your visits.  If you have any questions about medications, please call me [phone].  Lurena Joiner

## 2019-06-05 NOTE — Progress Notes (Signed)
   S:    PCP: Dr. [name]  Patient arrives in good spirits. Presents to the clinic for BP check.  Patient was referred and last seen by Primary Care Provider on 05/29/19. Dr. [name] started prn HCTZ for edema and left instructions for patient to use daily if her BP is elevated today. She reports today that she was surprised about her elevated BP. She states that her BP has been normal and sometimes even low in the past.   Patient reports adherence with medications.  Denies chest pain, dyspnea, HA or blurred vision. Reports improvement in lower extremity edema.   Current BP Medications include:  HCTZ 12.5 mg prn (only used twice since seeing Dr. [name] - took 1 dose last night)  Dietary habits include: compliant with salt restriction; drinks 1 cup of coffee on most days; drinks green tea daily Exercise habits include: walks daily Family / Social history:  - FHx: stroke (mother), MI (father) - Never smoker  - Drinks 1 glass of red wine several days/week  O:  L arm after 5 minutes rest: 116/75, HR 65  Home BP readings: not checking  Last 3 Office BP readings: BP Readings from Last 3 Encounters:  06/05/19 116/75  05/29/19 (!) 149/83  04/10/18 139/69   BMET    Component Value Date/Time   NA 138 05/29/2019 1127   K 4.4 05/29/2019 1127   CL 102 05/29/2019 1127   CO2 22 05/29/2019 1127   GLUCOSE 97 05/29/2019 1127   GLUCOSE 84 07/15/2014 1511   BUN 12 05/29/2019 1127   CREATININE 0.91 05/29/2019 1127   CALCIUM 9.4 05/29/2019 1127   GFRNONAA 57 (L) 05/29/2019 1127   GFRAA 66 05/29/2019 1127   Renal function: Estimated Creatinine Clearance: 45 mL/min (by C-G formula based on SCr of 0.91 mg/dL).  Clinical ASCVD: No  The ASCVD Risk score Mikey Bussing DC Jr., et al., 2013) failed to calculate for the following reasons:   The 2013 ASCVD risk score is only valid for ages 25 to 22  A/P: Hypertension undiagnosed but BP was elevated at previous encounter. Currently normal; she did have a  dose of HCTZ last night. Patient is adherent with current medications.  -Continued current regimen.  -Counseled on lifestyle modifications for blood pressure control including reduced dietary sodium, increased exercise, adequate sleep  Results reviewed and written information provided. Total time in face-to-face counseling 15 minutes.   F/U Clinic Visit w/ PCP.    Benard Halsted, PharmD, [place] [phone]

## 2019-06-16 ENCOUNTER — Telehealth: Payer: Self-pay | Admitting: Internal Medicine

## 2019-06-16 MED ORDER — PREDNISONE 20 MG PO TABS: ORAL_TABLET | ORAL | 0 refills | Status: DC

## 2019-06-16 NOTE — Telephone Encounter (Signed)
Returned pt call and went over Dr. [name] message pt states the calamine lotion has been helping. Pt states she will go pick up the prednisone. Pt states she doesn't need the appointment schedule for 8/25 with Dr. Margarita Rana

## 2019-06-16 NOTE — Telephone Encounter (Signed)
I received this as a phone message and will forward. I am not sure if the Triage messages are being sent to all of the providers

## 2019-06-16 NOTE — Telephone Encounter (Signed)
Patient would To know if she could be prescribed something for poison ivy scheduled an appt for tomorrow. Please follow up.

## 2019-06-16 NOTE — Telephone Encounter (Signed)
Pt has an appointment schedule with Dr. Margarita Rana 8/25.

## 2019-06-17 ENCOUNTER — Ambulatory Visit: Payer: PPO | Admitting: Family Medicine

## 2019-06-24 HISTORY — PX: CATARACT EXTRACTION: SUR2

## 2019-07-24 ENCOUNTER — Ambulatory Visit: Payer: PPO | Admitting: Internal Medicine

## 2019-07-30 ENCOUNTER — Ambulatory Visit (INDEPENDENT_AMBULATORY_CARE_PROVIDER_SITE_OTHER): Payer: PPO | Admitting: Neurology

## 2019-07-30 ENCOUNTER — Telehealth: Payer: Self-pay | Admitting: Neurology

## 2019-07-30 ENCOUNTER — Other Ambulatory Visit: Payer: Self-pay

## 2019-07-30 ENCOUNTER — Encounter: Payer: Self-pay | Admitting: Neurology

## 2019-07-30 VITALS — BP 139/71 | HR 69 | Temp 97.5°F | Ht 64.0 in | Wt 169.5 lb

## 2019-07-30 DIAGNOSIS — R200 Anesthesia of skin: Secondary | ICD-10-CM | POA: Diagnosis not present

## 2019-07-30 DIAGNOSIS — M48061 Spinal stenosis, lumbar region without neurogenic claudication: Secondary | ICD-10-CM

## 2019-07-30 NOTE — Progress Notes (Signed)
PATIENT: Krystal Moore DOB: 04/28/1930  Chief Complaint  Patient presents with  . Numbness    rm 4 New pt     HISTORICAL  Krystal Moore is a 85 year old female, seen in request by her primary care physician Dr. [name], Neoma Laming for evaluation of numbness tingling, initial evaluation was July 30, 2019.    I have reviewed and summarized the referring note from the referring physician.  She has past medical history of hypertension,  Since 2019, she began to notice bilateral foot numbness involving the lateral ankle to midfoot level, no tingling no pain, no gait abnormality, she does has mild low back pain but denies significant shooting pain, she walks 1 to 2 miles each day, also working the garden, denies bowel and bladder incontinence.  Laboratory evaluation 2020, A1c was 5.3, normal CBC with hemoglobin of 14.0, CMP, LDL of 132, cholesterol was 233, normal TSH, vitamin B12, vitamin D  Personally reviewed MRI of lumbar December 2014, moderate to severe multifactorial spinal stenosis at L4-5, due to advanced facet disease, grade 1 anterolisthesis, mild to moderate multifactorial spinal stenosis at L3-4,   REVIEW OF SYSTEMS: Full 14 system review of systems performed and notable only for as above All other review of systems were negative.  ALLERGIES: Allergies  Allergen Reactions  . Milk-Related Compounds Nausea And Vomiting  . Poison Ivy Extract [Poison Ivy Extract] Itching, Rash and Other (See Comments)    Muscle damage   . Sulfur Shortness Of Breath and Swelling  . Tobacco [Tobacco] Anaphylaxis and Swelling    Allergic to cigarette smoke  . Orange Juice [Orange Oil] Nausea And Vomiting    HOME MEDICATIONS: Current Outpatient Medications  Medication Sig Dispense Refill  . alendronate (FOSAMAX) 35 MG tablet Take 1 tablet (35 mg total) by mouth every 7 (seven) days. Take with a full glass of water on an empty stomach. Do not eat or take other meds for  at least 1 hr after. 4 tablet 6  . aspirin 81 MG tablet Take 81 mg by mouth daily.    . Cyanocobalamin 5000 MCG CAPS Take 5,000 mcg by mouth daily. Reported on 10/11/2015 30 capsule 11  . hydrochlorothiazide (HYDRODIURIL) 12.5 MG tablet 1 tab PO daily as needed for lower extremity swelling 30 tablet 3  . ketorolac (ACULAR) 0.4 % SOLN INT 1 GTT IN OS QID    . levothyroxine (SYNTHROID) 100 MCG tablet TAKE 1 TABLET BY MOUTH DAILY BEFORE BREAKFAST 90 tablet 0  . LUTEIN PO Take 1 tablet by mouth daily.    . Multiple Vitamins-Minerals (MULTIVITAMIN PO) Take 1 tablet by mouth daily.     . Multiple Vitamins-Minerals (ZINC PO) Take 1 tablet by mouth daily. Reported on 10/11/2015    . OVER THE COUNTER MEDICATION Take 1 tablet by mouth daily as needed (cold symtpoms). Reported on 10/11/2015    . prednisoLONE acetate (PRED FORTE) 1 % ophthalmic suspension SHAKE LQ AND INT 1 GTT IN OS QID    . predniSONE (DELTASONE) 20 MG tablet 1.5 tabs daily x 2 days then 1 tab Po daily x 2 days then 1/2 tab daily x 2 days 6 tablet 0  . sodium chloride (OCEAN) 0.65 % SOLN nasal spray Place 1 spray into both nostrils as needed for congestion. Reported on 10/11/2015    . Elastic Bandages & Supports (MEDICAL COMPRESSION STOCKINGS) MISC Knee high compression stockings 25-30 mm Hg pressure wear daily (Patient not taking: Reported on 07/30/2019) 4 each 0  .  methocarbamol (ROBAXIN) 500 MG tablet Take 1 tablet (500 mg total) by mouth every 8 (eight) hours as needed for muscle spasms. (Patient not taking: Reported on 05/29/2019) 60 tablet 0   No current facility-administered medications for this visit.     PAST MEDICAL HISTORY: Past Medical History:  Diagnosis Date  . Anemia   . Narcolepsy   . Neuropathy   . Obesity   . Pneumonia   . Polyneuropathy in other diseases classified elsewhere ([place])   . Shoulder fracture, left     PAST SURGICAL HISTORY: Past Surgical History:  Procedure Laterality Date  . CATARACT EXTRACTION  Left 06/2019  . MOUTH SURGERY    . NASAL SINUS SURGERY      FAMILY HISTORY: Family History  Problem Relation Age of Onset  . Stroke Mother        questionable  . Clotting disorder Mother   . Heart attack Father     SOCIAL HISTORY: Social History   Socioeconomic History  . Marital status: Widowed    Spouse name: Not on file  . Number of children: 0  . Years of education: college  . Highest education level: Not on file  Occupational History  . Not on file  Social Needs  . Financial resource strain: Not on file  . Food insecurity    Worry: Not on file    Inability: Not on file  . Transportation needs    Medical: Not on file    Non-medical: Not on file  Tobacco Use  . Smoking status: Never Smoker  . Smokeless tobacco: Never Used  Substance and Sexual Activity  . Alcohol use: Yes    Comment: 1/4 glass per day  . Drug use: No  . Sexual activity: Not Currently  Lifestyle  . Physical activity    Days per week: Not on file    Minutes per session: Not on file  . Stress: Not on file  Relationships  . Social Herbalist on phone: Not on file    Gets together: Not on file    Attends religious service: Not on file    Active member of club or organization: Not on file    Attends meetings of clubs or organizations: Not on file    Relationship status: Not on file  . Intimate partner violence    Fear of current or ex partner: Not on file    Emotionally abused: Not on file    Physically abused: Not on file    Forced sexual activity: Not on file  Other Topics Concern  . Not on file  Social History Narrative   Patient lives at home and she has a roommate.    Has a poodle.    Patient works part time Advertising copywriter.   Patient has a college education.   Both handed. Plays organ since age 76.    Caffeine- None      PHYSICAL EXAM   Vitals:   07/30/19 0759  BP: 139/71  Pulse: 69  Temp: (!) 97.5 F (36.4 C)  Weight: 169 lb 8 oz (76.9 kg)  Height: 5\' 4"   (1.626 m)    Not recorded      Body mass index is 29.09 kg/m.  PHYSICAL EXAMNIATION:  Gen: NAD, conversant, well nourised, well groomed                     Cardiovascular: Regular rate rhythm, no peripheral edema, warm, nontender. Eyes: Conjunctivae clear  without exudates or hemorrhage Neck: Supple, no carotid bruits. Pulmonary: Clear to auscultation bilaterally   NEUROLOGICAL EXAM:  MENTAL STATUS: Speech:    Speech is normal; fluent and spontaneous with normal comprehension.  Cognition:     Orientation to time, place and person     Normal recent and remote memory     Normal Attention span and concentration     Normal Language, naming, repeating,spontaneous speech     Fund of knowledge   CRANIAL NERVES: CN II: Visual fields are full to confrontation.  Pupils are round equal and briskly reactive to light. CN III, IV, VI: extraocular movement are normal. No ptosis. CN V: Facial sensation is intact to pinprick in all 3 divisions bilaterally. Corneal responses are intact.  CN VII: Face is symmetric with normal eye closure and smile. CN VIII: Hearing is normal to causal conversation. CN IX, X: Palate elevates symmetrically. Phonation is normal. CN XI: Head turning and shoulder shrug are intact CN XII: Tongue is midline with normal movements and no atrophy.  MOTOR: There is no pronator drift of out-stretched arms. Muscle bulk and tone are normal. Muscle strength is normal.  REFLEXES: Reflexes are 1 and symmetric at the biceps, triceps, knees, and ankles. Plantar responses are flexor.  SENSORY: Length dependent decreased to light touch, pinprick, vibratory sensation to ankle level   COORDINATION: Rapid alternating movements and fine finger movements are intact. There is no dysmetria on finger-to-nose and heel-knee-shin.    GAIT/STANCE: Posture is normal. Gait is steady with normal steps, base, arm swing, and turning.  She has mild difficulty with heel  walking,   DIAGNOSTIC DATA (LABS, IMAGING, TESTING) - I reviewed patient records, labs, notes, testing and imaging myself where available.   ASSESSMENT AND PLAN  Lyana Distel is a 85 y.o. female    Bilateral lower extremity paresthesia  Differentiation diagnosis including lumbar stenosis versus peripheral neuropathy  Proceed with MRI of lumbar  EMG nerve conduction study   Marcial Pacas, M.D. Ph.D.  [facility] [address] [place], [place] [address] Ph: [phone] Fax: [phone]  CC: Ladell Pier, MD

## 2019-07-30 NOTE — Telephone Encounter (Signed)
health team/medicaid order sent to [facility]. No auth they will reach out to the patient to schedule.

## 2019-08-21 ENCOUNTER — Other Ambulatory Visit: Payer: PPO

## 2019-09-02 ENCOUNTER — Other Ambulatory Visit: Payer: Self-pay | Admitting: Internal Medicine

## 2019-09-02 DIAGNOSIS — E039 Hypothyroidism, unspecified: Secondary | ICD-10-CM

## 2019-09-15 ENCOUNTER — Encounter: Payer: PPO | Admitting: Neurology

## 2019-09-17 ENCOUNTER — Inpatient Hospital Stay: Admission: RE | Admit: 2019-09-17 | Payer: PPO | Source: Ambulatory Visit

## 2019-10-06 ENCOUNTER — Telehealth: Payer: Self-pay | Admitting: *Deleted

## 2019-10-06 ENCOUNTER — Encounter: Payer: PPO | Admitting: Neurology

## 2019-10-06 ENCOUNTER — Encounter: Payer: Self-pay | Admitting: Neurology

## 2019-10-06 NOTE — Telephone Encounter (Signed)
No showed NCV/EMG appointment. 

## 2019-11-24 ENCOUNTER — Ambulatory Visit: Payer: PPO | Attending: Internal Medicine | Admitting: Internal Medicine

## 2019-11-24 ENCOUNTER — Other Ambulatory Visit: Payer: Self-pay

## 2019-11-24 ENCOUNTER — Encounter: Payer: Self-pay | Admitting: Internal Medicine

## 2019-11-24 VITALS — BP 148/82 | HR 96 | Temp 98.4°F | Resp 16 | Wt 168.6 lb

## 2019-11-24 DIAGNOSIS — M25511 Pain in right shoulder: Secondary | ICD-10-CM

## 2019-11-24 DIAGNOSIS — L659 Nonscarring hair loss, unspecified: Secondary | ICD-10-CM

## 2019-11-24 DIAGNOSIS — M8580 Other specified disorders of bone density and structure, unspecified site: Secondary | ICD-10-CM

## 2019-11-24 DIAGNOSIS — R600 Localized edema: Secondary | ICD-10-CM | POA: Diagnosis not present

## 2019-11-24 DIAGNOSIS — Z780 Asymptomatic menopausal state: Secondary | ICD-10-CM

## 2019-11-24 DIAGNOSIS — E039 Hypothyroidism, unspecified: Secondary | ICD-10-CM | POA: Diagnosis not present

## 2019-11-24 DIAGNOSIS — R030 Elevated blood-pressure reading, without diagnosis of hypertension: Secondary | ICD-10-CM

## 2019-11-24 MED ORDER — HYDROCHLOROTHIAZIDE 12.5 MG PO TABS: ORAL_TABLET | ORAL | 3 refills | Status: DC

## 2019-11-24 NOTE — Progress Notes (Signed)
Patient ID: Krystal Moore, female    DOB: Sep 03, 1930  MRN: [id number]  CC: Hypertension   Subjective: Krystal Moore is a 86 y.o. female who presents for chronic ds management Her concerns today include:  Hx of osteopenia on Fosamax, hypothyroid, obesity, Vit B12 def  6 wks ago she felt  A sudden intense pain in muscles posterior to RT shoulder jt.  Started as she was puttng her coat on.  Took Tylenol BID  Felt sore for 4 days then it went away.  Nothing since.  She was disturbed by it at the time as the pain was very intense.  Under a lot of stress in past 6 mths. Her ceiling has caved in 3 times in the past yr. It was fixed but apparently not fixed properly.  She is battling with the insurance company to get this taken care of permanently  Hypothyroid:  Compliant with Levothyroxine Reports some hair loss.  She started taking saw Palmetto because someone has told her that it will help with hair growth.  She wants to know whether this is okay.  Osteopenia: She is tolerating Fosamax.  Denies any GI upset is taking the medicine.  She remains active.  She eats a Mediterranean diet.  She walks a mile and a half every day with her dog.  She is independent in her ADLs.  She continues to drive.   Patient Active Problem List   Diagnosis Date Noted  . Numbness 07/30/2019  . Spinal stenosis of lumbar region 07/30/2019  . Osteopenia after menopause 05/29/2019  . Over weight 05/29/2019  . Elevated blood pressure reading 05/29/2019  . Immunization due 08/03/2017  . Left leg swelling 05/04/2017  . Allergic rhinitis 10/11/2015  . Hypothyroidism 12/10/2013  . Peripheral neuropathy 05/16/2013     Current Outpatient Medications on File Prior to Visit  Medication Sig Dispense Refill  . alendronate (FOSAMAX) 35 MG tablet Take 1 tablet (35 mg total) by mouth every 7 (seven) days. Take with a full glass of water on an empty stomach. Do not eat or take other meds for at least  1 hr after. 4 tablet 6  . aspirin 81 MG tablet Take 81 mg by mouth daily.    . Cyanocobalamin 5000 MCG CAPS Take 5,000 mcg by mouth daily. Reported on 10/11/2015 30 capsule 11  . Elastic Bandages & Supports (MEDICAL COMPRESSION STOCKINGS) MISC Knee high compression stockings 25-30 mm Hg pressure wear daily (Patient not taking: Reported on 07/30/2019) 4 each 0  . ketorolac (ACULAR) 0.4 % SOLN INT 1 GTT IN OS QID    . levothyroxine (SYNTHROID) 100 MCG tablet Take 1 tablet (100 mcg total) by mouth daily before breakfast. Must have office visit for refills. 90 tablet 0  . LUTEIN PO Take 1 tablet by mouth daily.    . methocarbamol (ROBAXIN) 500 MG tablet Take 1 tablet (500 mg total) by mouth every 8 (eight) hours as needed for muscle spasms. (Patient not taking: Reported on 05/29/2019) 60 tablet 0  . Multiple Vitamins-Minerals (MULTIVITAMIN PO) Take 1 tablet by mouth daily.     . Multiple Vitamins-Minerals (ZINC PO) Take 1 tablet by mouth daily. Reported on 10/11/2015    . OVER THE COUNTER MEDICATION Take 1 tablet by mouth daily as needed (cold symtpoms). Reported on 10/11/2015    . prednisoLONE acetate (PRED FORTE) 1 % ophthalmic suspension SHAKE LQ AND INT 1 GTT IN OS QID    . sodium chloride (OCEAN) 0.65 %  SOLN nasal spray Place 1 spray into both nostrils as needed for congestion. Reported on 10/11/2015     No current facility-administered medications on file prior to visit.    Allergies  Allergen Reactions  . Milk-Related Compounds Nausea And Vomiting  . Poison Ivy Extract [Poison Ivy Extract] Itching, Rash and Other (See Comments)    Muscle damage   . Sulfur Shortness Of Breath and Swelling  . Tobacco [Tobacco] Anaphylaxis and Swelling    Allergic to cigarette smoke  . Orange Juice [Orange Oil] Nausea And Vomiting    Social History   Socioeconomic History  . Marital status: Widowed    Spouse name: Not on file  . Number of children: 0  . Years of education: college  . Highest education  level: Not on file  Occupational History  . Not on file  Tobacco Use  . Smoking status: Never Smoker  . Smokeless tobacco: Never Used  Substance and Sexual Activity  . Alcohol use: Yes    Comment: 1/4 glass per day  . Drug use: No  . Sexual activity: Not Currently  Other Topics Concern  . Not on file  Social History Narrative   07/30/19 Patient lives at home and she has a roommate.    Has a poodle.    Patient works part time Advertising copywriter.   Patient has a college education.   Both handed. Plays organ since age 89.    Caffeine- None    Social Determinants of Health   Financial Resource Strain:   . Difficulty of Paying Living Expenses: Not on file  Food Insecurity:   . Worried About Charity fundraiser in the Last Year: Not on file  . Ran Out of Food in the Last Year: Not on file  Transportation Needs:   . Lack of Transportation (Medical): Not on file  . Lack of Transportation (Non-Medical): Not on file  Physical Activity:   . Days of Exercise per Week: Not on file  . Minutes of Exercise per Session: Not on file  Stress:   . Feeling of Stress : Not on file  Social Connections:   . Frequency of Communication with Friends and Family: Not on file  . Frequency of Social Gatherings with Friends and Family: Not on file  . Attends Religious Services: Not on file  . Active Member of Clubs or Organizations: Not on file  . Attends Archivist Meetings: Not on file  . Marital Status: Not on file  Intimate Partner Violence:   . Fear of Current or Ex-Partner: Not on file  . Emotionally Abused: Not on file  . Physically Abused: Not on file  . Sexually Abused: Not on file    Family History  Problem Relation Age of Onset  . Stroke Mother        questionable  . Clotting disorder Mother   . Heart attack Father     Past Surgical History:  Procedure Laterality Date  . CATARACT EXTRACTION Left 06/2019  . MOUTH SURGERY    . NASAL SINUS SURGERY      ROS: Review  of Systems Negative except as stated above  PHYSICAL EXAM: BP (!) 148/82   Pulse 96   Temp 98.4 F (36.9 C) (Oral)   Resp 16   Wt 168 lb 9.6 oz (76.5 kg)   SpO2 97%   BMI 28.94 kg/m   Wt Readings from Last 3 Encounters:  11/24/19 168 lb 9.6 oz (76.5 kg)  07/30/19  169 lb 8 oz (76.9 kg)  05/29/19 173 lb 3.2 oz (78.6 kg)    Physical Exam  General appearance - alert, well appearing, elderly female and in no distress Mental status - normal mood, behavior, speech, dress, motor activity, and thought processes Chest - clear to auscultation, no wheezes, rales or rhonchi, symmetric air entry Heart - normal rate, regular rhythm, normal S1, S2, no murmurs, rubs, clicks or gallops Musculoskeletal -no tenderness on palpation of the right shoulder joint or surrounding muscles. Extremities -no lower extremity edema Skin: No bald spots noted on the scalp.  I did not appreciate any hair breaking at the shaft when I pulled on the hair CMP Latest Ref Rng & Units 05/29/2019 04/10/2018 08/03/2017  Glucose 65 - 99 mg/dL 97 90 90  BUN 8 - 27 mg/dL 12 16 14   Creatinine 0.57 - 1.00 mg/dL 0.91 0.94 0.96  Sodium 134 - 144 mmol/L 138 140 138  Potassium 3.5 - 5.2 mmol/L 4.4 4.5 4.4  Chloride 96 - 106 mmol/L 102 104 102  CO2 20 - 29 mmol/L 22 21 23   Calcium 8.7 - 10.3 mg/dL 9.4 9.5 9.4  Total Protein 6.0 - 8.5 g/dL 6.3 6.1 6.3  Total Bilirubin 0.0 - 1.2 mg/dL 0.5 0.3 0.4  Alkaline Phos 39 - 117 IU/L 66 59 61  AST 0 - 40 IU/L 21 20 26   ALT 0 - 32 IU/L 19 17 25    Lipid Panel     Component Value Date/Time   CHOL 233 (H) 05/29/2019 1127   TRIG 55 05/29/2019 1127   HDL 90 05/29/2019 1127   CHOLHDL 2.6 05/29/2019 1127   CHOLHDL 3.1 06/08/2016 1155   VLDL 20 06/08/2016 1155   LDLCALC 132 (H) 05/29/2019 1127    CBC    Component Value Date/Time   WBC 5.1 05/29/2019 1127   WBC 4.6 06/08/2016 1155   RBC 4.46 05/29/2019 1127   RBC 4.60 06/08/2016 1155   HGB 14.0 05/29/2019 1127   HGB 13.5 06/07/2015  1240   HCT 41.0 05/29/2019 1127   HCT 41.0 06/07/2015 1240   PLT 238 05/29/2019 1127   MCV 92 05/29/2019 1127   MCV 91.9 06/07/2015 1240   MCH 31.4 05/29/2019 1127   MCH 30.7 06/08/2016 1155   MCHC 34.1 05/29/2019 1127   MCHC 33.9 06/08/2016 1155   RDW 12.2 05/29/2019 1127   RDW 13.6 06/07/2015 1240   LYMPHSABS 1.5 06/07/2015 1240   MONOABS 0.4 06/07/2015 1240   EOSABS 0.2 06/07/2015 1240   BASOSABS 0.1 06/07/2015 1240    ASSESSMENT AND PLAN: 1. Acute pain of right shoulder This has resolved.  It it sounds as though it may have been a muscle spasm or pulled muscle.  2. Edema leg Refill on the hydrochlorothiazide which she takes as needed - hydrochlorothiazide (HYDRODIURIL) 12.5 MG tablet; 1 tab PO daily as needed for lower extremity swelling  Dispense: 30 tablet; Refill: 3  3. Elevated blood pressure reading Blood pressure is still elevated today.  She reports low-salt diet.  I told her that if blood pressure remains elevated we may need to have her take the hydrochlorothiazide every day.  She plans to purchase a home blood pressure monitoring device.  I told her if she does so to check the blood pressure at least twice a week and record the readings and bring them in on follow-up visit in 6 weeks.  4. Hypothyroidism, unspecified type Continue levothyroxine - TSH; Future  5. Hair loss I advised  her to stop the saw palmetto as I do not think it will help in terms of hair restoration and can have some adverse effects including ease of bleeding -We will check TSH to make sure that her thyroid level is still good on the current dose of levothyroxine.  If it is normal we can refer her to dermatology  6. Osteopenia after menopause Tolerating Fosamax. Encouraged her to continue her daily exercise routine   Patient was given the opportunity to ask questions.  Patient verbalized understanding of the plan and was able to repeat key elements of the plan.   Orders Placed This  Encounter  Procedures  . TSH     Requested Prescriptions   Signed Prescriptions Disp Refills  . hydrochlorothiazide (HYDRODIURIL) 12.5 MG tablet 30 tablet 3    Sig: 1 tab PO daily as needed for lower extremity swelling    Return in about 6 weeks (around 01/05/2020).  Karle Plumber, MD, FACP

## 2019-11-24 NOTE — Patient Instructions (Signed)
He can come back to the lab later this week to have your thyroid level checked. Stop the saw palmetto.

## 2019-12-01 ENCOUNTER — Other Ambulatory Visit: Payer: Self-pay | Admitting: Internal Medicine

## 2019-12-01 DIAGNOSIS — R600 Localized edema: Secondary | ICD-10-CM

## 2019-12-01 DIAGNOSIS — E039 Hypothyroidism, unspecified: Secondary | ICD-10-CM

## 2019-12-08 ENCOUNTER — Ambulatory Visit: Payer: PPO | Attending: Internal Medicine

## 2019-12-08 ENCOUNTER — Other Ambulatory Visit: Payer: Self-pay

## 2019-12-09 ENCOUNTER — Other Ambulatory Visit: Payer: Self-pay | Admitting: Internal Medicine

## 2019-12-09 DIAGNOSIS — L659 Nonscarring hair loss, unspecified: Secondary | ICD-10-CM

## 2019-12-09 LAB — TSH: TSH: 2.61 u[IU]/mL (ref 0.450–4.500)

## 2019-12-14 ENCOUNTER — Ambulatory Visit: Payer: PPO | Attending: Internal Medicine

## 2019-12-14 DIAGNOSIS — Z23 Encounter for immunization: Secondary | ICD-10-CM | POA: Insufficient documentation

## 2019-12-14 NOTE — Progress Notes (Signed)
   Covid-19 Vaccination Clinic  Name:  Krystal Moore    MRN: [id number] DOB: 07-05-1930  12/14/2019  [name] was observed post Covid-19 immunization for 15 minutes without incidence. She was provided with Vaccine Information Sheet and instruction to access the V-Safe system.   Ms. Krystal Moore was instructed to call 911 with any severe reactions post vaccine: Marland Kitchen Difficulty breathing  . Swelling of your face and throat  . A fast heartbeat  . A bad rash all over your body  . Dizziness and weakness    Immunizations Administered    Name Date Dose VIS Date Route   Pfizer COVID-19 Vaccine 12/14/2019  2:08 PM 0.3 mL 10/03/2019 Intramuscular   Manufacturer: [place]   Lot: [id number]   [place]: [id number]

## 2020-01-06 ENCOUNTER — Other Ambulatory Visit: Payer: Self-pay | Admitting: Internal Medicine

## 2020-01-06 DIAGNOSIS — Z780 Asymptomatic menopausal state: Secondary | ICD-10-CM

## 2020-01-06 DIAGNOSIS — M8580 Other specified disorders of bone density and structure, unspecified site: Secondary | ICD-10-CM

## 2020-01-07 ENCOUNTER — Ambulatory Visit: Payer: PPO | Attending: Internal Medicine

## 2020-01-07 DIAGNOSIS — Z23 Encounter for immunization: Secondary | ICD-10-CM

## 2020-01-07 NOTE — Progress Notes (Signed)
   Covid-19 Vaccination Clinic  Name:  Krystal Moore    MRN: [id number] DOB: 06/21/1930  01/07/2020  [name] was observed post Covid-19 immunization for 15 minutes without incident. She was provided with Vaccine Information Sheet and instruction to access the V-Safe system.   [name] was instructed to call 911 with any severe reactions post vaccine: Marland Kitchen Difficulty breathing  . Swelling of face and throat  . A fast heartbeat  . A bad rash all over body  . Dizziness and weakness   Immunizations Administered    Name Date Dose VIS Date Route   Pfizer COVID-19 Vaccine 01/07/2020  9:56 AM 0.3 mL 10/03/2019 Intramuscular   Manufacturer: [place]   Lot: [id number]   [place]: [id number]

## 2020-03-08 ENCOUNTER — Other Ambulatory Visit: Payer: Self-pay | Admitting: Internal Medicine

## 2020-03-08 DIAGNOSIS — E039 Hypothyroidism, unspecified: Secondary | ICD-10-CM

## 2020-03-09 ENCOUNTER — Encounter (HOSPITAL_COMMUNITY): Payer: Self-pay

## 2020-03-09 ENCOUNTER — Emergency Department (HOSPITAL_COMMUNITY)
Admission: EM | Admit: 2020-03-09 | Discharge: 2020-03-09 | Disposition: A | Discharge status: Home / Self Care | Payer: PPO | Attending: Emergency Medicine | Admitting: Emergency Medicine

## 2020-03-09 ENCOUNTER — Emergency Department (HOSPITAL_COMMUNITY): Payer: PPO

## 2020-03-09 ENCOUNTER — Other Ambulatory Visit: Payer: Self-pay

## 2020-03-09 DIAGNOSIS — E039 Hypothyroidism, unspecified: Secondary | ICD-10-CM | POA: Insufficient documentation

## 2020-03-09 DIAGNOSIS — Y999 Unspecified external cause status: Secondary | ICD-10-CM | POA: Diagnosis not present

## 2020-03-09 DIAGNOSIS — E876 Hypokalemia: Secondary | ICD-10-CM | POA: Diagnosis not present

## 2020-03-09 DIAGNOSIS — Z79899 Other long term (current) drug therapy: Secondary | ICD-10-CM | POA: Diagnosis not present

## 2020-03-09 DIAGNOSIS — Y939 Activity, unspecified: Secondary | ICD-10-CM | POA: Insufficient documentation

## 2020-03-09 DIAGNOSIS — W01198A Fall on same level from slipping, tripping and stumbling with subsequent striking against other object, initial encounter: Secondary | ICD-10-CM | POA: Insufficient documentation

## 2020-03-09 DIAGNOSIS — S0990XA Unspecified injury of head, initial encounter: Secondary | ICD-10-CM | POA: Diagnosis present

## 2020-03-09 DIAGNOSIS — Y929 Unspecified place or not applicable: Secondary | ICD-10-CM | POA: Diagnosis not present

## 2020-03-09 DIAGNOSIS — Z7982 Long term (current) use of aspirin: Secondary | ICD-10-CM | POA: Diagnosis not present

## 2020-03-09 DIAGNOSIS — S0101XA Laceration without foreign body of scalp, initial encounter: Secondary | ICD-10-CM | POA: Insufficient documentation

## 2020-03-09 DIAGNOSIS — W19XXXA Unspecified fall, initial encounter: Secondary | ICD-10-CM

## 2020-03-09 LAB — CBC WITH DIFFERENTIAL/PLATELET
Abs Immature Granulocytes: 0 10*3/uL (ref 0.00–0.07)
Basophils Absolute: 0.1 10*3/uL (ref 0.0–0.1)
Basophils Relative: 2 %
Eosinophils Absolute: 0.2 10*3/uL (ref 0.0–0.5)
Eosinophils Relative: 4 %
HCT: 37.6 % (ref 36.0–46.0)
Hemoglobin: 12.8 g/dL (ref 12.0–15.0)
Immature Granulocytes: 0 %
Lymphocytes Relative: 32 %
Lymphs Abs: 1.3 10*3/uL (ref 0.7–4.0)
MCH: 30.7 pg (ref 26.0–34.0)
MCHC: 34 g/dL (ref 30.0–36.0)
MCV: 90.2 fL (ref 80.0–100.0)
Monocytes Absolute: 0.3 10*3/uL (ref 0.1–1.0)
Monocytes Relative: 8 %
Neutro Abs: 2.3 10*3/uL (ref 1.7–7.7)
Neutrophils Relative %: 54 %
Platelets: 241 10*3/uL (ref 150–400)
RBC: 4.17 MIL/uL (ref 3.87–5.11)
RDW: 12.9 % (ref 11.5–15.5)
WBC: 4.1 10*3/uL (ref 4.0–10.5)
nRBC: 0 % (ref 0.0–0.2)

## 2020-03-09 LAB — COMPREHENSIVE METABOLIC PANEL
ALT: 18 U/L (ref 0–44)
AST: 25 U/L (ref 15–41)
Albumin: 3.6 g/dL (ref 3.5–5.0)
Alkaline Phosphatase: 42 U/L (ref 38–126)
Anion gap: 9 (ref 5–15)
BUN: 12 mg/dL (ref 8–23)
CO2: 24 mmol/L (ref 22–32)
Calcium: 9.7 mg/dL (ref 8.9–10.3)
Chloride: 100 mmol/L (ref 98–111)
Creatinine, Ser: 0.89 mg/dL (ref 0.44–1.00)
GFR calc Af Amer: >60 mL/min (ref >60)
GFR calc non Af Amer: 58 mL/min — ABNORMAL LOW (ref >60)
Glucose, Bld: 104 mg/dL — ABNORMAL HIGH (ref 70–99)
Potassium: 3.3 mmol/L — ABNORMAL LOW (ref 3.5–5.1)
Sodium: 133 mmol/L — ABNORMAL LOW (ref 135–145)
Total Bilirubin: 0.7 mg/dL (ref 0.3–1.2)
Total Protein: 6.1 g/dL — ABNORMAL LOW (ref 6.5–8.1)

## 2020-03-09 LAB — CBG MONITORING, ED: Glucose-Capillary: 104 mg/dL — ABNORMAL HIGH (ref 70–99)

## 2020-03-09 MED ORDER — POTASSIUM CHLORIDE CRYS ER 20 MEQ PO TBCR
20.0000 meq | EXTENDED_RELEASE_TABLET | Freq: Every day | ORAL | 0 refills | Status: DC
Start: 2020-03-09 — End: 2021-04-22

## 2020-03-09 MED ORDER — LIDOCAINE-EPINEPHRINE (PF) 2 %-1:200000 IJ SOLN
10.0000 mL | Freq: Once | INTRAMUSCULAR | Status: DC
  Filled 2020-03-09: qty 20

## 2020-03-09 MED ORDER — POTASSIUM CHLORIDE CRYS ER 20 MEQ PO TBCR
40.0000 meq | EXTENDED_RELEASE_TABLET | Freq: Once | ORAL | Status: AC
  Administered 2020-03-09: 40 meq via ORAL
  Filled 2020-03-09: qty 2

## 2020-03-09 NOTE — ED Triage Notes (Signed)
Pt was walking her dog and had an unwitnessed fall. Found by neighbor concious and alert but does not remember the fall. Laceration to head, bleeding controlled on arrival.  AOx4 on arrival, 112 CBG. Unknown if on thinners. 152/62, HR 56, 96-100% on RA

## 2020-03-09 NOTE — Discharge Instructions (Signed)
You were seen in the ER today after a fall.  Your CT of your head and your shoulder xray did not show any significant injuries. Your labs showed that your potassium was low at 3.3, we are sending you home with a supplement and diet guidelines and would like you to have this rechecked by your primary care provider within 1 week.  Your laceration was closed with Dermabond, a type of skin glue/skin adhesive, please try to keep the area clean and dry for the first 24 hours, after 24 hours you may get the area wet but do not soak it.  The skin glue will come off on its own.  Please see attached wound care instructions.  Please follow-up with your primary care provider within 1 week.  Return to the ER for new or worsening symptoms including but not limited to increased headache, change in your vision, numbness, weakness, confusion, redness/drainage from the wound, fever, or any other concerns.

## 2020-03-09 NOTE — ED Provider Notes (Signed)
[facility] EMERGENCY DEPARTMENT Provider Note   CSN: [id number] Arrival date & time: 03/09/20  1305     History Chief Complaint  Patient presents with  . Fall    Krystal Moore is a 86 y.o. female with a history of anemia, narcolepsy, and  hypothyroidism who presents to the emergency department via EMS status post fall with head injury.  Currently patient is having difficulty remembering the fall or the events of the morning therefore level 5 caveat applies, her neighbor however did witness the fall and states she got tangled in her dog's leash and fell.  Currently she states that her head hurts on the left side, mild in severity, no alleviating or aggravating factors.  She has no other complaints at this time.  She denies visual disturbance, numbness, weakness, neck pain, back pain, chest pain, or dyspnea.  She cannot remember falling, but she does not recall any specific lightheadedness/dizziness throughout the day.  Her last tetanus was within the past few years after she was bitten by a dog.  She denies blood thinner use.  Per EMS to triage team for additional history, patient was was found conscious by her neighbor but did not remember the fall.  HPI     Past Medical History:  Diagnosis Date  . Anemia   . Narcolepsy   . Neuropathy   . Obesity   . Pneumonia   . Polyneuropathy in other diseases classified elsewhere ([place])   . Shoulder fracture, left     Patient Active Problem List   Diagnosis Date Noted  . Numbness 07/30/2019  . Spinal stenosis of lumbar region 07/30/2019  . Osteopenia after menopause 05/29/2019  . Over weight 05/29/2019  . Elevated blood pressure reading 05/29/2019  . Immunization due 08/03/2017  . Left leg swelling 05/04/2017  . Allergic rhinitis 10/11/2015  . Hypothyroidism 12/10/2013  . Peripheral neuropathy 05/16/2013    Past Surgical History:  Procedure Laterality Date  . CATARACT EXTRACTION Left 06/2019  . MOUTH  SURGERY    . NASAL SINUS SURGERY       OB History   No obstetric history on file.     Family History  Problem Relation Age of Onset  . Stroke Mother        questionable  . Clotting disorder Mother   . Heart attack Father     Social History   Tobacco Use  . Smoking status: Never Smoker  . Smokeless tobacco: Never Used  Substance Use Topics  . Alcohol use: Yes    Comment: 1/4 glass per day  . Drug use: No    Home Medications Prior to Admission medications   Medication Sig Start Date End Date Taking? Authorizing Provider  alendronate (FOSAMAX) 35 MG tablet TAKE 1 TABLET BY MOUTH EVERY 7 DAYS, WITH WATER ON AN EMPTY STOMACH, DO NOT EAT OR TAKE OTHER MEDICATION FOR AT LEAST 1 HOUR 01/06/20   Ladell Pier, MD  aspirin 81 MG tablet Take 81 mg by mouth daily.    [provider]  Cyanocobalamin 5000 MCG CAPS Take 5,000 mcg by mouth daily. Reported on 10/11/2015 10/11/15   Boykin Nearing, MD  Elastic Bandages & Supports (MEDICAL COMPRESSION STOCKINGS) MISC Knee high compression stockings 25-30 mm Hg pressure wear daily Patient not taking: Reported on 07/30/2019 05/04/17   Boykin Nearing, MD  hydrochlorothiazide (HYDRODIURIL) 12.5 MG tablet TAKE 1 TABLET BY MOUTH DAILY AS NEEDED FOR LOWER EXTREMITY SWELLING 12/01/19   Ladell Pier, MD  ketorolac (ACULAR) 0.4 % SOLN INT 1 GTT IN OS QID 07/11/19   [provider]  levothyroxine (SYNTHROID) 100 MCG tablet Take 1 tablet (100 mcg total) by mouth daily before breakfast. Must have office visit for refills 03/09/20   Ladell Pier, MD  LUTEIN PO Take 1 tablet by mouth daily.    [provider]  methocarbamol (ROBAXIN) 500 MG tablet Take 1 tablet (500 mg total) by mouth every 8 (eight) hours as needed for muscle spasms. Patient not taking: Reported on 05/29/2019 02/19/19   Argentina Donovan, PA-C  Multiple Vitamins-Minerals (MULTIVITAMIN PO) Take 1 tablet by mouth daily.     [provider]    Multiple Vitamins-Minerals (ZINC PO) Take 1 tablet by mouth daily. Reported on 10/11/2015    [provider]  OVER THE COUNTER MEDICATION Take 1 tablet by mouth daily as needed (cold symtpoms). Reported on 10/11/2015    [provider]  prednisoLONE acetate (PRED FORTE) 1 % ophthalmic suspension SHAKE LQ AND INT 1 GTT IN OS QID 07/11/19   [provider]  sodium chloride (OCEAN) 0.65 % SOLN nasal spray Place 1 spray into both nostrils as needed for congestion. Reported on 10/11/2015    [provider]    Allergies    Milk-related compounds, Poison ivy extract [poison ivy extract], Sulfur, Tobacco [tobacco], and Orange juice [orange oil]  Review of Systems   Review of Systems  Unable to perform ROS: Mental status change    Physical Exam Updated Vital Signs BP 99/65   Pulse (!) 55   Temp 97.8 F (36.6 C) (Oral)   Resp 13   Ht 5\' 4"  (1.626 m)   Wt 68.9 kg   SpO2 98%   BMI 26.09 kg/m   Physical Exam Vitals and nursing note reviewed.  Constitutional:      General: She is not in acute distress.    Appearance: She is well-developed. She is not toxic-appearing.  HENT:     Head: Normocephalic.      Comments: No raccoon eyes or battle sign.     Ears:     Comments: No hemotympanum or otorrhea.    Nose: No rhinorrhea.  Eyes:     General: Vision grossly intact. Gaze aligned appropriately.        Right eye: No discharge.        Left eye: No discharge.     Extraocular Movements: Extraocular movements intact.     Conjunctiva/sclera: Conjunctivae normal.     Pupils: Pupils are equal, round, and reactive to light.     Comments: PERRL. No proptosis.   Neck:     Comments: No midline tenderness. Cardiovascular:     Rate and Rhythm: Regular rhythm. Bradycardia present.     Comments: 2+ symmetric radial pulses. Pulmonary:     Effort: Pulmonary effort is normal. No respiratory distress.     Breath sounds: Normal breath sounds. No wheezing, rhonchi or  rales.  Chest:     Chest wall: No tenderness.  Abdominal:     General: There is no distension.     Palpations: Abdomen is soft.     Tenderness: There is no abdominal tenderness. There is no guarding or rebound.  Musculoskeletal:     Cervical back: Normal range of motion and neck supple. No rigidity.     Comments: Upper extremities: Intact active range of motion throughout.  Patient mildly tender to the left anterior glenohumeral joint.  Otherwise nontender. Back: No midline tenderness  or palpable step-off Lower extremities: Intact active range of motion, nontender.  Skin:    General: Skin is warm and dry.     Findings: No rash.  Neurological:     Mental Status: She is oriented to person, place, and time.     Comments: Alert. Clear speech. No facial droop. CNIII-XII grossly intact. Bilateral upper and lower extremities' sensation grossly intact. 5/5 symmetric strength with grip strength and with plantar and dorsi flexion bilaterally . Normal finger to nose bilaterally. Negative pronator drift. Negative Romberg sign. Gait is steady and intact.   Psychiatric:        Behavior: Behavior normal.    ED Results / Procedures / Treatments   Labs (all labs ordered are listed, but only abnormal results are displayed) Labs Reviewed  COMPREHENSIVE METABOLIC PANEL - Abnormal; Notable for the following components:      Result Value   Sodium 133 (*)    Potassium 3.3 (*)    Glucose, Bld 104 (*)    Total Protein 6.1 (*)    GFR calc non Af Amer 58 (*)    All other components within normal limits  CBG MONITORING, ED - Abnormal; Notable for the following components:   Glucose-Capillary 104 (*)    All other components within normal limits  CBC WITH DIFFERENTIAL/PLATELET    EKG EKG Interpretation  Date/Time:  Tuesday Mar 09 2020 13:12:43 EDT Ventricular Rate:  57 PR Interval:    QRS Duration: 111 QT Interval:  466 QTC Calculation: 454 R Axis:   -48 Text Interpretation: Sinus rhythm  Borderline prolonged PR interval Left anterior fascicular block Anterior infarct, old Confirmed by Sherwood Gambler [phone]) on 03/09/2020 1:18:00 PM   Radiology CT Head Wo Contrast  Result Date: 03/09/2020 CLINICAL DATA:  Unwitnessed fall, head laceration EXAM: CT HEAD WITHOUT CONTRAST TECHNIQUE: Contiguous axial images were obtained from the base of the skull through the vertex without intravenous contrast. COMPARISON:  07/15/2014 FINDINGS: Brain: No evidence of acute infarction, hemorrhage, hydrocephalus, extra-axial collection or mass lesion/mass effect. Mild low-density changes within the periventricular and subcortical white matter compatible with chronic microvascular ischemic change. Mild diffuse cerebral volume loss. Vascular: Atherosclerotic calcifications involving the large vessels of the skull base. No unexpected hyperdense vessel. Skull: Normal. Negative for fracture or focal lesion. Sinuses/Orbits: No acute finding. Other: Superficial soft tissue swelling likely overlying laceration in the left parietal region. IMPRESSION: 1. No acute intracranial findings. 2. Superficial soft tissue swelling with laceration in the left parietal region. No underlying calvarial fracture. 3. Mild chronic microvascular ischemic change and cerebral volume loss. Electronically Signed   By: Davina Poke D.O.   On: 03/09/2020 14:21   DG Shoulder Left  Result Date: 03/09/2020 CLINICAL DATA:  Left shoulder pain after fall EXAM: LEFT SHOULDER - 2+ VIEW COMPARISON:  03/24/2010 FINDINGS: Chronic posttraumatic deformity of the left humeral head and neck related to remote impacted fracture, unchanged in appearance compared to prior x-ray 2011. Mild arthropathy of the glenohumeral joint. AC joint appears intact. No acute fractures. No dislocation. Soft tissues appear within normal limits. IMPRESSION: 1. No acute osseous abnormality. 2. Chronic posttraumatic deformity of the left humeral head and neck. Electronically  Signed   By: Davina Poke D.O.   On: 03/09/2020 14:23    Procedures .Marland KitchenLaceration Repair  Date/Time: 03/09/2020 3:41 PM Performed by: Amaryllis Dyke, PA-C Authorized by: Amaryllis Dyke, PA-C   Consent:    Consent obtained:  Verbal   Consent given by:  Patient  Risks discussed:  Infection, need for additional repair, poor cosmetic result, pain and retained foreign body   Alternatives discussed:  No treatment (alternative closure with staples) Anesthesia (see MAR for exact dosages):    Anesthesia method:  None Laceration details:    Location:  Scalp   Scalp location:  L parietal   Length (cm):  3   Depth (mm):  3 Repair type:    Repair type:  Simple Pre-procedure details:    Preparation:  Patient was prepped and draped in usual sterile fashion and imaging obtained to evaluate for foreign bodies Exploration:    Hemostasis achieved with:  Direct pressure   Wound exploration: wound explored through full range of motion and entire depth of wound probed and visualized   Treatment:    Area cleansed with:  Betadine   Amount of cleaning:  Standard   Irrigation solution:  Sterile water   Irrigation method:  Pressure wash Skin repair:    Repair method:  Tissue adhesive (Closure with hair apposition technique x 2) Post-procedure details:    Dressing:  Open (no dressing)   Patient tolerance of procedure:  Tolerated well, no immediate complications   (including critical care time)  Medications Ordered in ED Medications  lidocaine-EPINEPHrine (XYLOCAINE W/EPI) 2 %-1:200000 (PF) injection 10 mL (has no administration in time range)    ED Course  I have reviewed the triage vital signs and the nursing notes.  Pertinent labs & imaging results that were available during my care of the patient were reviewed by me and considered in my medical decision making (see chart for details).    MDM Rules/Calculators/A&P                     Patient presents to the ED for  evaluation status post fall with head injury which she does not remember, however her neighbor witnessed the fall and states that she was tripped up in a leash while walking her dog therefore low suspicion for her syncopal versus syncopal event..  Nontoxic, vitals without significant abnormality, initial blood pressure is soft, will check orthostatics, mild bradycardia noted..  She has an obvious head injury with laceration which will be irrigated.  She has some mild tenderness to the left anterior glenohumeral joint, upper extremities are otherwise nontender, neurovascularly intact distally.    Additional history obtained:  Additional history obtained from chart review as well as EMS to triage. EKG: No STEMI.  Lab Tests:  I Ordered, reviewed, and interpreted labs, which included:  CBG: 104.  CBC: No anemia or leukocytosis CMP: Mild electrolyte abnormalities as above.  No renal dysfunction.  We will orally replace potassium. Imaging Studies ordered:  I ordered imaging studies which included CT head & left shoulder xray, I independently visualized and interpreted imaging which showed no acute intracranial findings or acute fractures/dislocations to the left shoulder, additional information as above.   ED Course:  Orthostatic VS for the past 24 hrs:  BP- Lying Pulse- Lying BP- Sitting Pulse- Sitting BP- Standing at 0 minutes Pulse- Standing at 0 minutes  03/09/20 1441 147/75 53 140/66 50 146/71 56   No significantly concerning findings with orthostatic vitals.  Her BP is improved without intervention in the ER. CT head without acute intracranial process therefore do not suspect head bleed.  She has no midline spinal tenderness or tenderness to the chest/abdomen.  Left shoulder x-ray is negative and she is neurovascularly intact distally.  Her labs are reassuring.  Based on history  provided by her neighbor to the patient seems to have been a mechanical fall.  Her scalp laceration was pressure  irrigated, visualized in a bloodless field, no evidence of foreign body.  This was closed using hair apposition technique after discussing option of care physician technique with Dermabond versus staples, patient elected for Dermabond.  Tetanus is up-to-date.  She is feeling well, is alert and oriented, and appears appropriate for discharge at this time. I discussed results, treatment plan, need for follow-up, and return precautions with the patient and her friend at bedside.  Provided opportunity for questions, patient and her friend confirmed understanding and are in agreement with plan.   This is a shared visit with supervising physician Dr. Regenia Skeeter who has independently evaluated patient & provided guidance in evaluation/management/disposition, in agreement with care   Portions of this note were generated with Dragon dictation software. Dictation errors may occur despite best attempts at proofreading.  Final Clinical Impression(s) / ED Diagnoses Final diagnoses:  Fall, initial encounter  Laceration of scalp, initial encounter  Hypokalemia    Rx / DC Orders ED Discharge Orders         Ordered    potassium chloride SA (KLOR-CON) 20 MEQ tablet  Daily     03/09/20 1546           Awa Bachicha, Glynda Jaeger, PA-C 03/09/20 1549    Sherwood Gambler, MD 03/12/20 [phone]

## 2020-03-09 NOTE — ED Notes (Signed)
Patient transported to CT 

## 2020-04-08 ENCOUNTER — Other Ambulatory Visit: Payer: Self-pay | Admitting: Internal Medicine

## 2020-04-08 DIAGNOSIS — E039 Hypothyroidism, unspecified: Secondary | ICD-10-CM

## 2020-07-07 ENCOUNTER — Other Ambulatory Visit: Payer: Self-pay | Admitting: Internal Medicine

## 2020-07-07 DIAGNOSIS — R600 Localized edema: Secondary | ICD-10-CM

## 2020-07-07 NOTE — Telephone Encounter (Signed)
Requested  medications are  due for refill today yes  Requested medications are on the active medication list yes  Last refill 5/17  Last visit 11/2019  Future visit scheduled No  Notes to clinic Failed protocol of visit within 6 months, note states to return in 6 weeks. Please assess.

## 2020-09-02 ENCOUNTER — Other Ambulatory Visit: Payer: Self-pay | Admitting: Internal Medicine

## 2020-09-02 DIAGNOSIS — E039 Hypothyroidism, unspecified: Secondary | ICD-10-CM

## 2020-10-13 ENCOUNTER — Emergency Department (HOSPITAL_COMMUNITY)
Admission: EM | Admit: 2020-10-13 | Discharge: 2020-10-13 | Disposition: A | Discharge status: Home / Self Care | Payer: PPO | Attending: Emergency Medicine | Admitting: Emergency Medicine

## 2020-10-13 ENCOUNTER — Other Ambulatory Visit: Payer: Self-pay

## 2020-10-13 ENCOUNTER — Encounter (HOSPITAL_COMMUNITY): Payer: Self-pay | Admitting: Emergency Medicine

## 2020-10-13 DIAGNOSIS — Z5321 Procedure and treatment not carried out due to patient leaving prior to being seen by health care provider: Secondary | ICD-10-CM | POA: Diagnosis not present

## 2020-10-13 DIAGNOSIS — K5900 Constipation, unspecified: Secondary | ICD-10-CM | POA: Diagnosis not present

## 2020-10-13 DIAGNOSIS — R109 Unspecified abdominal pain: Secondary | ICD-10-CM | POA: Diagnosis present

## 2020-10-13 NOTE — ED Triage Notes (Signed)
Patient reports abdominal cramping and constipation, takes a laxative (Miralax) daily, last BM was 10/11/20. Reports drinking 2 cups of coffee and 5 glasses of water a day. Denies nausea or vomiting.

## 2020-11-11 ENCOUNTER — Ambulatory Visit: Payer: PPO

## 2020-11-16 ENCOUNTER — Ambulatory Visit: Payer: PPO | Attending: Internal Medicine

## 2020-11-16 DIAGNOSIS — Z23 Encounter for immunization: Secondary | ICD-10-CM

## 2020-11-16 NOTE — Progress Notes (Signed)
   Covid-19 Vaccination Clinic  Name:  Anayia Eugene    MRN: [id number] DOB: April 04, 1930  11/16/2020  [name] was observed post Covid-19 immunization for 15 minutes without incident. She was provided with Vaccine Information Sheet and instruction to access the V-Safe system.   [name] was instructed to call 911 with any severe reactions post vaccine: Marland Kitchen Difficulty breathing  . Swelling of face and throat  . A fast heartbeat  . A bad rash all over body  . Dizziness and weakness   Immunizations Administered    Name Date Dose VIS Date Route   PFIZER Comrnaty(Gray TOP) Covid-19 Vaccine 11/16/2020  3:05 PM 0.3 mL 09/30/2020 Intramuscular   Manufacturer: [employer]   Lot: [id number]   NDC: [phone]

## 2020-11-28 ENCOUNTER — Emergency Department (HOSPITAL_COMMUNITY): Payer: PPO

## 2020-11-28 ENCOUNTER — Other Ambulatory Visit: Payer: Self-pay

## 2020-11-28 ENCOUNTER — Emergency Department (HOSPITAL_COMMUNITY)
Admission: EM | Admit: 2020-11-28 | Discharge: 2020-11-29 | Disposition: A | Discharge status: Home / Self Care | Payer: PPO | Attending: Emergency Medicine | Admitting: Emergency Medicine

## 2020-11-28 DIAGNOSIS — S52571A Other intraarticular fracture of lower end of right radius, initial encounter for closed fracture: Secondary | ICD-10-CM | POA: Diagnosis not present

## 2020-11-28 DIAGNOSIS — S0990XA Unspecified injury of head, initial encounter: Secondary | ICD-10-CM | POA: Insufficient documentation

## 2020-11-28 DIAGNOSIS — S0083XA Contusion of other part of head, initial encounter: Secondary | ICD-10-CM | POA: Insufficient documentation

## 2020-11-28 DIAGNOSIS — W19XXXA Unspecified fall, initial encounter: Secondary | ICD-10-CM

## 2020-11-28 DIAGNOSIS — W108XXA Fall (on) (from) other stairs and steps, initial encounter: Secondary | ICD-10-CM | POA: Insufficient documentation

## 2020-11-28 DIAGNOSIS — Z23 Encounter for immunization: Secondary | ICD-10-CM | POA: Insufficient documentation

## 2020-11-28 DIAGNOSIS — E039 Hypothyroidism, unspecified: Secondary | ICD-10-CM | POA: Diagnosis not present

## 2020-11-28 DIAGNOSIS — Y93K1 Activity, walking an animal: Secondary | ICD-10-CM | POA: Diagnosis not present

## 2020-11-28 DIAGNOSIS — Z7982 Long term (current) use of aspirin: Secondary | ICD-10-CM | POA: Diagnosis not present

## 2020-11-28 DIAGNOSIS — S6991XA Unspecified injury of right wrist, hand and finger(s), initial encounter: Secondary | ICD-10-CM | POA: Diagnosis present

## 2020-11-28 DIAGNOSIS — Z79899 Other long term (current) drug therapy: Secondary | ICD-10-CM | POA: Diagnosis not present

## 2020-11-28 DIAGNOSIS — S62101A Fracture of unspecified carpal bone, right wrist, initial encounter for closed fracture: Secondary | ICD-10-CM

## 2020-11-28 DIAGNOSIS — T07XXXA Unspecified multiple injuries, initial encounter: Secondary | ICD-10-CM

## 2020-11-28 MED ORDER — IBUPROFEN 400 MG PO TABS
400.0000 mg | ORAL_TABLET | Freq: Once | ORAL | Status: AC | PRN
  Administered 2020-11-28: 400 mg via ORAL
  Filled 2020-11-28: qty 1

## 2020-11-28 NOTE — ED Notes (Signed)
Spoke w/ Dr. Almyra Free about pt and imaging orders

## 2020-11-28 NOTE — ED Triage Notes (Addendum)
Pt presents to ED POV. Pt c/o L eye pain and R wrist. Pt reports that she was walking dog and he pulled her over and she fell face forward. Pt reports no LOC, no blood thinners. Significant swelling to L eye and lac above. Swelling and deformity to R wrist. R radial +2. Pt AAO x4

## 2020-11-29 DIAGNOSIS — S52571A Other intraarticular fracture of lower end of right radius, initial encounter for closed fracture: Secondary | ICD-10-CM | POA: Diagnosis not present

## 2020-11-29 MED ORDER — HYDROCODONE-ACETAMINOPHEN 5-325 MG PO TABS
1.0000 | ORAL_TABLET | Freq: Once | ORAL | Status: AC
  Administered 2020-11-29: 1 via ORAL
  Filled 2020-11-29: qty 1

## 2020-11-29 MED ORDER — TETANUS-DIPHTH-ACELL PERTUSSIS 5-2.5-18.5 LF-MCG/0.5 IM SUSY
0.5000 mL | PREFILLED_SYRINGE | Freq: Once | INTRAMUSCULAR | Status: AC
  Administered 2020-11-29: 0.5 mL via INTRAMUSCULAR
  Filled 2020-11-29: qty 0.5

## 2020-11-29 MED ORDER — HYDROCODONE-ACETAMINOPHEN 5-325 MG PO TABS: 2.0000 | ORAL_TABLET | ORAL | 0 refills | Status: DC | PRN

## 2020-11-29 NOTE — Progress Notes (Signed)
Orthopedic Tech Progress Note Patient Details:  Krystal Moore 11-Oct-1930 [id number]  Ortho Devices Type of Ortho Device: Velcro wrist forearm splint,Sling immobilizer Ortho Device/Splint Location: Right Upper Extremity Ortho Device/Splint Interventions: Ordered,Application,Adjustment   Post Interventions Patient Tolerated: Well Instructions Provided: Adjustment of device,Care of device,Poper ambulation with device   [name] 11/29/2020, 6:43 PM

## 2020-11-29 NOTE — ED Provider Notes (Signed)
[facility] EMERGENCY DEPARTMENT Provider Note   CSN: [id number] Arrival date & time: 11/28/20  2036     History Chief Complaint  Patient presents with  . Fall    Krystal Moore is a 87 y.o. female.  HPI Patient was walking her dog, going back to her home, climbing steps, when it suddenly pulled her forward.  Her face struck the steps and her right arm caught the fall.  She presents, on 11/28/2020, for evaluation of pain.  She states she was able to walk after the fall.  I saw her her after a prolonged wait, at 5 PM on 11/29/2020.  Patient has had imaging done, and had medication for pain given.  She states that she is feeling some better, but still has persistent pain mostly in her right wrist.  Patient denies prodrome prior to the fall, headache, neck pain or back pain.  She denies any other recent illnesses.  There are no other known modifying factors.    Past Medical History:  Diagnosis Date  . Anemia   . Narcolepsy   . Neuropathy   . Obesity   . Pneumonia   . Polyneuropathy in other diseases classified elsewhere ([place])   . Shoulder fracture, left     Patient Active Problem List   Diagnosis Date Noted  . Numbness 07/30/2019  . Spinal stenosis of lumbar region 07/30/2019  . Osteopenia after menopause 05/29/2019  . Over weight 05/29/2019  . Elevated blood pressure reading 05/29/2019  . Immunization due 08/03/2017  . Left leg swelling 05/04/2017  . Allergic rhinitis 10/11/2015  . Hypothyroidism 12/10/2013  . Peripheral neuropathy 05/16/2013    Past Surgical History:  Procedure Laterality Date  . CATARACT EXTRACTION Left 06/2019  . MOUTH SURGERY    . NASAL SINUS SURGERY       OB History   No obstetric history on file.     Family History  Problem Relation Age of Onset  . Stroke Mother        questionable  . Clotting disorder Mother   . Heart attack Father     Social History   Tobacco Use  . Smoking status: Never Smoker  .  Smokeless tobacco: Never Used  Substance Use Topics  . Alcohol use: Yes    Comment: 1/4 glass per day  . Drug use: No    Home Medications Prior to Admission medications   Medication Sig Start Date End Date Taking? Authorizing Provider  HYDROcodone-acetaminophen (NORCO/VICODIN) 5-325 MG tablet Take 2 tablets by mouth every 4 (four) hours as needed for moderate pain. 11/29/20  Yes Daleen Bo, MD  alendronate (FOSAMAX) 35 MG tablet TAKE 1 TABLET BY MOUTH EVERY 7 DAYS, WITH WATER ON AN EMPTY STOMACH, DO NOT EAT OR TAKE OTHER MEDICATION FOR AT LEAST 1 HOUR Patient taking differently: Take 35 mg by mouth every 7 (seven) days.  01/06/20   Ladell Pier, MD  aspirin 81 MG tablet Take 81 mg by mouth daily.    [provider]  betamethasone dipropionate 0.05 % lotion Apply 1 application topically 2 (two) times a week. 01/23/20   [provider]  Cyanocobalamin 5000 MCG CAPS Take 5,000 mcg by mouth daily. Reported on 10/11/2015 10/11/15   Boykin Nearing, MD  Elastic Bandages & Supports (MEDICAL COMPRESSION STOCKINGS) MISC Knee high compression stockings 25-30 mm Hg pressure wear daily Patient not taking: Reported on 07/30/2019 05/04/17   Boykin Nearing, MD  hydrochlorothiazide (HYDRODIURIL) 12.5 MG tablet TAKE 1  TABLET DAILY AS NEEDED FOR LOWER EXTREMITY SWELLING 07/08/20   Ladell Pier, MD  levothyroxine (SYNTHROID) 100 MCG tablet TAKE 1 TABLET(100 MCG) BY MOUTH DAILY BEFORE BREAKFAST 09/02/20   Ladell Pier, MD  LUTEIN PO Take 1 tablet by mouth daily.    [provider]  methocarbamol (ROBAXIN) 500 MG tablet Take 1 tablet (500 mg total) by mouth every 8 (eight) hours as needed for muscle spasms. Patient not taking: Reported on 05/29/2019 02/19/19   Argentina Donovan, PA-C  Multiple Vitamins-Minerals (MULTIVITAMIN PO) Take 1 tablet by mouth daily.     [provider]  OVER THE COUNTER MEDICATION Take 1 tablet by mouth daily as needed (cold symtpoms).  Reported on 10/11/2015    [provider]  potassium chloride SA (KLOR-CON) 20 MEQ tablet Take 1 tablet (20 mEq total) by mouth daily. 03/09/20   Petrucelli, Samantha R, PA-C  sodium chloride (OCEAN) 0.65 % SOLN nasal spray Place 1 spray into both nostrils as needed for congestion. Reported on 10/11/2015    [provider]    Allergies    Elemental sulfur, Milk-related compounds, Poison ivy extract [poison ivy extract], Tobacco [tobacco], and Orange juice [orange oil]  Review of Systems   Review of Systems  All other systems reviewed and are negative.   Physical Exam Updated Vital Signs BP (!) 122/53 (BP Location: Left Arm)   Pulse (!) 56   Temp (!) 97.5 F (36.4 C) (Oral)   Resp 18   SpO2 99%   Physical Exam Vitals and nursing note reviewed.  Constitutional:      General: She is not in acute distress.    Appearance: She is well-developed and well-nourished. She is not ill-appearing, toxic-appearing or diaphoretic.  HENT:     Head: Normocephalic.     Comments: Contusion and superficial laceration in the left lateral forehead.  It is not bleeding.  There is no associated crepitation.  There is swelling and bruising beneath the left eye.  There is no skin break at this site.  No TMJ motion abnormality.  No midface crepitation or deformity.  No bleeding from the nose.    Right Ear: External ear normal.     Left Ear: External ear normal.  Eyes:     Extraocular Movements: EOM normal.     Conjunctiva/sclera: Conjunctivae normal.     Pupils: Pupils are equal, round, and reactive to light.  Neck:     Trachea: Phonation normal.  Cardiovascular:     Rate and Rhythm: Normal rate.  Pulmonary:     Effort: Pulmonary effort is normal.  Chest:     Chest wall: No tenderness or bony tenderness.  Abdominal:     General: There is no distension.     Palpations: Abdomen is soft.     Tenderness: There is no abdominal tenderness.  Musculoskeletal:     Cervical back: Normal  range of motion and neck supple.     Comments: Mild swelling right wrist with guarding against movement secondary to pain.  Remainder right arm, left arm, and both legs with good motion and no deformities.  Skin:    General: Skin is warm, dry and intact.  Neurological:     Mental Status: She is alert and oriented to person, place, and time.     Cranial Nerves: No cranial nerve deficit.     Sensory: No sensory deficit.     Motor: No abnormal muscle tone.     Coordination: Coordination normal.  Psychiatric:        Mood and Affect: Mood and affect and mood normal.        Behavior: Behavior normal.        Thought Content: Thought content normal.        Judgment: Judgment normal.     ED Results / Procedures / Treatments   Labs (all labs ordered are listed, but only abnormal results are displayed) Labs Reviewed - No data to display  EKG None  Radiology DG Wrist Complete Right  Result Date: 11/28/2020 CLINICAL DATA:  Swelling interval remedy of right wrist after fall today EXAM: RIGHT WRIST - COMPLETE 3+ VIEW COMPARISON:  None. FINDINGS: Comminuted distal radial fracture with extension to the radiocarpal joint. Soft tissue swelling about the wrist. IMPRESSION: Comminuted intra-articular distal radial fracture. Electronically Signed   By: Maudry MayhewJeffrey  Waltz MD   On: 11/28/2020 21:36   CT Head Wo Contrast  Result Date: 11/28/2020 CLINICAL DATA:  Fall with trauma to the head and neck. EXAM: CT HEAD WITHOUT CONTRAST CT MAXILLOFACIAL WITHOUT CONTRAST CT CERVICAL SPINE WITHOUT CONTRAST TECHNIQUE: Multidetector CT imaging of the head, cervical spine, and maxillofacial structures were performed using the standard protocol without intravenous contrast. Multiplanar CT image reconstructions of the cervical spine and maxillofacial structures were also generated. COMPARISON:  03/09/2020 FINDINGS: CT HEAD FINDINGS Brain: Brain does not show accelerated atrophy for age. No evidence of acute infarction, mass  lesion, hemorrhage, hydrocephalus or extra-axial collection. Vascular: No abnormal vascular finding. Skull: No skull fracture. Other: None CT MAXILLOFACIAL FINDINGS Osseous: No facial fracture. Orbits: No orbital pathologic finding. Sinuses: Sinuses are clear. Soft tissues: Soft tissue swelling of the left cheek. CT CERVICAL SPINE FINDINGS Alignment: No traumatic malalignment. Skull base and vertebrae: No fracture or primary bone lesion. Soft tissues and spinal canal: No traumatic soft tissue finding. Disc levels: Foramen magnum is widely patent. Osteoarthritis of the C1-2 articulation could be associated with craniocervical pain syndromes. C2-3 shows chronic fusion without stenosis. C3-4: Mild spondylosis. Facet osteoarthritis worse on the left. Mild bilateral foraminal narrowing. C4-5: Spondylosis more pronounced on the left. Facet osteoarthritis worse on the right. Mild bony foraminal stenosis on both sides. C5-6: Chronic disc degeneration with loss of disc height. Endplate osteophytes. No compressive stenosis. C6-7: Chronic disc degeneration loss of disc height. Endplate osteophytes. Moderate bilateral bony foraminal narrowing. C7-T1: Bilateral facet osteoarthritis. Mild bilateral foraminal narrowing. Upper chest: Negative Other: None IMPRESSION: HEAD CT: No acute or traumatic finding. Normal appearance of the brain for age. MAXILLOFACIAL CT: No facial fracture. Soft tissue swelling of the left cheek. CERVICAL SPINE CT: No acute or traumatic finding. Chronic degenerative changes as outlined above. Electronically Signed   By: Paulina FusiMark  Shogry M.D.   On: 11/28/2020 21:46   CT Cervical Spine Wo Contrast  Result Date: 11/28/2020 CLINICAL DATA:  Fall with trauma to the head and neck. EXAM: CT HEAD WITHOUT CONTRAST CT MAXILLOFACIAL WITHOUT CONTRAST CT CERVICAL SPINE WITHOUT CONTRAST TECHNIQUE: Multidetector CT imaging of the head, cervical spine, and maxillofacial structures were performed using the standard protocol  without intravenous contrast. Multiplanar CT image reconstructions of the cervical spine and maxillofacial structures were also generated. COMPARISON:  03/09/2020 FINDINGS: CT HEAD FINDINGS Brain: Brain does not show accelerated atrophy for age. No evidence of acute infarction, mass lesion, hemorrhage, hydrocephalus or extra-axial collection. Vascular: No abnormal vascular finding. Skull: No skull fracture. Other: None CT MAXILLOFACIAL FINDINGS Osseous: No facial fracture. Orbits: No orbital pathologic finding. Sinuses: Sinuses are clear. Soft tissues: Soft  tissue swelling of the left cheek. CT CERVICAL SPINE FINDINGS Alignment: No traumatic malalignment. Skull base and vertebrae: No fracture or primary bone lesion. Soft tissues and spinal canal: No traumatic soft tissue finding. Disc levels: Foramen magnum is widely patent. Osteoarthritis of the C1-2 articulation could be associated with craniocervical pain syndromes. C2-3 shows chronic fusion without stenosis. C3-4: Mild spondylosis. Facet osteoarthritis worse on the left. Mild bilateral foraminal narrowing. C4-5: Spondylosis more pronounced on the left. Facet osteoarthritis worse on the right. Mild bony foraminal stenosis on both sides. C5-6: Chronic disc degeneration with loss of disc height. Endplate osteophytes. No compressive stenosis. C6-7: Chronic disc degeneration loss of disc height. Endplate osteophytes. Moderate bilateral bony foraminal narrowing. C7-T1: Bilateral facet osteoarthritis. Mild bilateral foraminal narrowing. Upper chest: Negative Other: None IMPRESSION: HEAD CT: No acute or traumatic finding. Normal appearance of the brain for age. MAXILLOFACIAL CT: No facial fracture. Soft tissue swelling of the left cheek. CERVICAL SPINE CT: No acute or traumatic finding. Chronic degenerative changes as outlined above. Electronically Signed   By: Nelson Chimes M.D.   On: 11/28/2020 21:46   CT Maxillofacial Wo Contrast  Result Date: 11/28/2020 CLINICAL  DATA:  Fall with trauma to the head and neck. EXAM: CT HEAD WITHOUT CONTRAST CT MAXILLOFACIAL WITHOUT CONTRAST CT CERVICAL SPINE WITHOUT CONTRAST TECHNIQUE: Multidetector CT imaging of the head, cervical spine, and maxillofacial structures were performed using the standard protocol without intravenous contrast. Multiplanar CT image reconstructions of the cervical spine and maxillofacial structures were also generated. COMPARISON:  03/09/2020 FINDINGS: CT HEAD FINDINGS Brain: Brain does not show accelerated atrophy for age. No evidence of acute infarction, mass lesion, hemorrhage, hydrocephalus or extra-axial collection. Vascular: No abnormal vascular finding. Skull: No skull fracture. Other: None CT MAXILLOFACIAL FINDINGS Osseous: No facial fracture. Orbits: No orbital pathologic finding. Sinuses: Sinuses are clear. Soft tissues: Soft tissue swelling of the left cheek. CT CERVICAL SPINE FINDINGS Alignment: No traumatic malalignment. Skull base and vertebrae: No fracture or primary bone lesion. Soft tissues and spinal canal: No traumatic soft tissue finding. Disc levels: Foramen magnum is widely patent. Osteoarthritis of the C1-2 articulation could be associated with craniocervical pain syndromes. C2-3 shows chronic fusion without stenosis. C3-4: Mild spondylosis. Facet osteoarthritis worse on the left. Mild bilateral foraminal narrowing. C4-5: Spondylosis more pronounced on the left. Facet osteoarthritis worse on the right. Mild bony foraminal stenosis on both sides. C5-6: Chronic disc degeneration with loss of disc height. Endplate osteophytes. No compressive stenosis. C6-7: Chronic disc degeneration loss of disc height. Endplate osteophytes. Moderate bilateral bony foraminal narrowing. C7-T1: Bilateral facet osteoarthritis. Mild bilateral foraminal narrowing. Upper chest: Negative Other: None IMPRESSION: HEAD CT: No acute or traumatic finding. Normal appearance of the brain for age. MAXILLOFACIAL CT: No facial  fracture. Soft tissue swelling of the left cheek. CERVICAL SPINE CT: No acute or traumatic finding. Chronic degenerative changes as outlined above. Electronically Signed   By: Nelson Chimes M.D.   On: 11/28/2020 21:46    Procedures Procedures   Medications Ordered in ED Medications  Tdap (BOOSTRIX) injection 0.5 mL (has no administration in time range)  HYDROcodone-acetaminophen (NORCO/VICODIN) 5-325 MG per tablet 1 tablet (has no administration in time range)  ibuprofen (ADVIL) tablet 400 mg (400 mg Oral Given 11/28/20 2113)  HYDROcodone-acetaminophen (NORCO/VICODIN) 5-325 MG per tablet 1 tablet (1 tablet Oral Given 11/29/20 0401)    ED Course  I have reviewed the triage vital signs and the nursing notes.  Pertinent labs & imaging results that were available  during my care of the patient were reviewed by me and considered in my medical decision making (see chart for details).    MDM Rules/Calculators/A&P                           Patient Vitals for the past 24 hrs:  BP Temp Temp src Pulse Resp SpO2  11/29/20 1646 (!) 122/53 (!) 97.5 F (36.4 C) Oral (!) 56 18 99 %  11/29/20 1208 112/62 -- -- (!) 54 17 99 %  11/29/20 0935 (!) 112/47 -- -- (!) 53 14 96 %  11/29/20 0630 (!) 120/52 98.3 F (36.8 C) Oral (!) 56 18 98 %  11/29/20 0139 (!) 115/52 -- -- (!) 58 16 99 %  11/28/20 2056 (!) 131/51 97.9 F (36.6 C) Oral 68 18 97 %    5:29 PM Reevaluation with update and discussion. After initial assessment and treatment, an updated evaluation reveals she remains fairly comfortable, no further complaints, findings discussed and questions answered. Daleen Bo   Medical Decision Making:  This patient is presenting for evaluation of fall likely mechanical injury face and right wrist, which does require a range of treatment options, and is a complaint that involves a moderate risk of morbidity and mortality. The differential diagnoses include contusions, fractures, CNS injury. I decided to  review old records, and in summary elderly female, fell yesterday, was amatory afterwards and presents primarily for right wrist pain.  I did not require additional historical information from anyone.   Radiologic Tests Ordered, included clinical evaluation.  I independently Visualized: CT and plain images, which show right wrist fracture, comminuted without significant angulation or deformity.  Images of head, face and cervical spine with CT are reassuring    Critical Interventions-clinical evaluation, medication treatment, imaging, observation reassessment  After These Interventions, the Patient was reevaluated and was found stable for discharge.  Right wrist fracture will be splinted, he does not require reduction in the ED.  Wrist fracture is closed.  Facial injuries without significant deformities/fracture or intracranial injuries.  Minor small laceration left forehead, not amenable to closure at this time.  CRITICAL CARE-no Performed by: Daleen Bo  Nursing Notes Reviewed/ Care Coordinated Applicable Imaging Reviewed Interpretation of Laboratory Data incorporated into ED treatment  The patient appears reasonably screened and/or stabilized for discharge and I doubt any other medical condition or other [facility] requiring further screening, evaluation, or treatment in the ED at this time prior to discharge.  Plan: Home Medications-continue usual; Home Treatments-elevate right arm; return here if the recommended treatment, does not improve the symptoms; Recommended follow up-orthopedic and PCP checkup 1 week and as needed     Final Clinical Impression(s) / ED Diagnoses Final diagnoses:  Fall, initial encounter  Closed fracture of right wrist, initial encounter  Contusion, multiple sites  Injury of head, initial encounter    Rx / DC Orders ED Discharge Orders         Ordered    HYDROcodone-acetaminophen (NORCO/VICODIN) 5-325 MG tablet  Every 4 hours PRN        11/29/20 1723            Daleen Bo, MD 11/29/20 1811

## 2020-11-29 NOTE — ED Notes (Signed)
Right ring finger ring had to be cut off due to swelling of ring finger. Pt approved removal. Removed with ring cutter without incident.

## 2020-11-29 NOTE — Discharge Instructions (Signed)
Use ice on the sore areas 3 times a day for 2 days.  Clean the wounds of the face with soap and water daily.  Elevate your right hand above your heart as much as possible to help decrease pain.  Call the orthopedic doctor, for a follow-up appointment in 1 week.  Also see your PCP for checkup.  Be careful about taking the medication for pain, hydrocodone.  It can make you weak and dizzy.  Do not drive or drink alcohol when taking this medication.

## 2020-12-02 ENCOUNTER — Telehealth: Payer: Self-pay

## 2020-12-02 ENCOUNTER — Ambulatory Visit: Payer: Self-pay | Admitting: *Deleted

## 2020-12-02 MED ORDER — HYDROCODONE-ACETAMINOPHEN 5-325 MG PO TABS: 2.0000 | ORAL_TABLET | Freq: Three times a day (TID) | ORAL | 0 refills | Status: DC | PRN

## 2020-12-02 NOTE — Telephone Encounter (Signed)
Patient called and says she was in the ED for a broken wrist and was given 15 Hydrocodone. She says she ran out and would like to know what else she can take if she has pain. I advised OTC Tylenol and dosage, advised OTC ibuprofen if able to take. She verbalized understanding and says she has an ortho appointment on Tuesday and left a message at that office as well.  Reason for Disposition . Caller has medicine question only, adult not sick, AND triager answers question  Protocols used: MEDICATION QUESTION CALL-A-AH

## 2020-12-02 NOTE — Telephone Encounter (Signed)
Will forward to provider  

## 2020-12-02 NOTE — Telephone Encounter (Signed)
Patient called she stated she is being seen 2/15 she received hydrocodone from the ER but has ran out she would like to know what can be recommended for pain call [phone] [facility] [phone] [address], [place] [address]  [place], [place] [address]  Phone:  [phone] Fax:  [phone]

## 2020-12-02 NOTE — Telephone Encounter (Signed)
Patient called she stated she is being seen 2/15 she received hydrocodone from the ER but has ran out she would like to know what can be recommended for pain call back:[phone]  Called patient and no answer. Left voicemail to call clinic back.

## 2020-12-03 NOTE — Telephone Encounter (Signed)
I don't see where we have seen this patient

## 2020-12-03 NOTE — Telephone Encounter (Signed)
Called pt to advise. She states her PCP prescribed something in the meantime. She will come see [place] on Tuesday.

## 2020-12-04 ENCOUNTER — Other Ambulatory Visit: Payer: Self-pay | Admitting: Internal Medicine

## 2020-12-04 DIAGNOSIS — R600 Localized edema: Secondary | ICD-10-CM

## 2020-12-04 DIAGNOSIS — E039 Hypothyroidism, unspecified: Secondary | ICD-10-CM

## 2020-12-06 ENCOUNTER — Other Ambulatory Visit: Payer: Self-pay | Admitting: Internal Medicine

## 2020-12-06 DIAGNOSIS — E039 Hypothyroidism, unspecified: Secondary | ICD-10-CM

## 2020-12-06 DIAGNOSIS — R600 Localized edema: Secondary | ICD-10-CM

## 2020-12-06 NOTE — Telephone Encounter (Signed)
Contacted pt and made aware that rx has been sent to the pharmacy. Pt doesn't have any questions or concerns

## 2020-12-07 ENCOUNTER — Ambulatory Visit (INDEPENDENT_AMBULATORY_CARE_PROVIDER_SITE_OTHER): Payer: PPO

## 2020-12-07 ENCOUNTER — Encounter (HOSPITAL_COMMUNITY): Payer: Self-pay | Admitting: Emergency Medicine

## 2020-12-07 ENCOUNTER — Ambulatory Visit (INDEPENDENT_AMBULATORY_CARE_PROVIDER_SITE_OTHER): Payer: PPO | Admitting: Orthopaedic Surgery

## 2020-12-07 ENCOUNTER — Ambulatory Visit: Payer: Self-pay | Admitting: *Deleted

## 2020-12-07 ENCOUNTER — Ambulatory Visit (HOSPITAL_COMMUNITY)
Admission: EM | Admit: 2020-12-07 | Discharge: 2020-12-07 | Disposition: A | Discharge status: Home / Self Care | Payer: PPO | Attending: Internal Medicine | Admitting: Internal Medicine

## 2020-12-07 ENCOUNTER — Encounter: Payer: Self-pay | Admitting: Orthopaedic Surgery

## 2020-12-07 ENCOUNTER — Ambulatory Visit: Payer: Self-pay

## 2020-12-07 ENCOUNTER — Telehealth: Payer: Self-pay | Admitting: Internal Medicine

## 2020-12-07 ENCOUNTER — Other Ambulatory Visit: Payer: Self-pay

## 2020-12-07 DIAGNOSIS — M25531 Pain in right wrist: Secondary | ICD-10-CM | POA: Diagnosis not present

## 2020-12-07 DIAGNOSIS — K5900 Constipation, unspecified: Secondary | ICD-10-CM | POA: Diagnosis not present

## 2020-12-07 DIAGNOSIS — K5901 Slow transit constipation: Secondary | ICD-10-CM | POA: Diagnosis not present

## 2020-12-07 MED ORDER — ONDANSETRON 4 MG PO TBDP: 4.0000 mg | ORAL_TABLET | Freq: Three times a day (TID) | ORAL | 0 refills | Status: DC | PRN

## 2020-12-07 MED ORDER — BISACODYL 10 MG RE SUPP: 10.0000 mg | RECTAL | 0 refills | Status: DC | PRN

## 2020-12-07 MED ORDER — HYDROCODONE-ACETAMINOPHEN 5-325 MG PO TABS: 1.0000 | ORAL_TABLET | Freq: Four times a day (QID) | ORAL | 0 refills | Status: DC | PRN

## 2020-12-07 MED ORDER — POLYETHYLENE GLYCOL 3350 17 G PO PACK: 17.0000 g | PACK | Freq: Every day | ORAL | 0 refills | Status: DC

## 2020-12-07 MED ORDER — HYDROCODONE-ACETAMINOPHEN 5-325 MG PO TABS: 1.0000 | ORAL_TABLET | Freq: Three times a day (TID) | ORAL | 0 refills | Status: DC | PRN

## 2020-12-07 NOTE — Telephone Encounter (Signed)
Copied from [place] [phone]. Topic: General - Other >> Dec 07, 2020 12:05 PM Antonieta Iba C wrote: Reason for CRM: pt called in to speak with provider Raul Del. Pt says that she would like to update her after having her Ortho visit that provider referred her to. Pt says that she has also been able to have a bowl movement in a week. Pt says that she is taking in plenty of fluids. Pt says that she is very constipated. Pt says that she has seen provider for concern.   Pt would like a call back at: [phone]   Pt is a pt of Dr. [name] but is requesting to speak with Zelda. Please follow up if appropriate.

## 2020-12-07 NOTE — ED Triage Notes (Signed)
Pt states that she has been taking a pain killer and since starting them she has not had a BM in nine days. PT states that she is taking hydrocodone for pain. Pt states that she started taking laxative Wednesday and still no relief.

## 2020-12-07 NOTE — ED Provider Notes (Signed)
[place]    CSN: [id number] Arrival date & time: 12/07/20  1450      History   Chief Complaint Chief Complaint  Patient presents with  . Constipation    HPI Krystal Moore is a 87 y.o. female comes to urgent care complaining of crampy abdominal pain of a few days duration.  Patient recently started on narcotics prescribed after she sustained a right comminuted intra-articular distal radial fracture.  Patient says abdominal pain is crampy and of moderate severity.  Last bowel movement was about a week ago.  She has nausea with no vomiting.  She has been passing gas.  No abdominal distention.  Patient was not started on any stool softeners until a couple of days ago when she started on MiraLAX.  No fever or chills.  He tried a suppository this morning with no good results.  She has tried a couple of doses of MiraLAX.No weight loss or changes  HPI  Past Medical History:  Diagnosis Date  . Anemia   . Narcolepsy   . Neuropathy   . Obesity   . Pneumonia   . Polyneuropathy in other diseases classified elsewhere ([place])   . Shoulder fracture, left     Patient Active Problem List   Diagnosis Date Noted  . Numbness 07/30/2019  . Spinal stenosis of lumbar region 07/30/2019  . Osteopenia after menopause 05/29/2019  . Over weight 05/29/2019  . Elevated blood pressure reading 05/29/2019  . Immunization due 08/03/2017  . Left leg swelling 05/04/2017  . Allergic rhinitis 10/11/2015  . Hypothyroidism 12/10/2013  . Peripheral neuropathy 05/16/2013    Past Surgical History:  Procedure Laterality Date  . CATARACT EXTRACTION Left 06/2019  . MOUTH SURGERY    . NASAL SINUS SURGERY      OB History   No obstetric history on file.      Home Medications    Prior to Admission medications   Medication Sig Start Date End Date Taking? Authorizing Provider  bisacodyl (DULCOLAX) 10 MG suppository Place 1 suppository (10 mg total) rectally as needed for moderate  constipation. 12/07/20  Yes Lamptey, Myrene Galas, MD  ondansetron (ZOFRAN ODT) 4 MG disintegrating tablet Take 1 tablet (4 mg total) by mouth every 8 (eight) hours as needed for nausea or vomiting. 12/07/20  Yes Lamptey, Myrene Galas, MD  polyethylene glycol (MIRALAX) 17 g packet Take 17 g by mouth daily. 12/07/20  Yes Lamptey, Myrene Galas, MD  alendronate (FOSAMAX) 35 MG tablet TAKE 1 TABLET BY MOUTH EVERY 7 DAYS, WITH WATER ON AN EMPTY STOMACH, DO NOT EAT OR TAKE OTHER MEDICATION FOR AT LEAST 1 HOUR Patient taking differently: Take 35 mg by mouth every 7 (seven) days.  01/06/20   Ladell Pier, MD  aspirin 81 MG tablet Take 81 mg by mouth daily.    [provider]  betamethasone dipropionate 0.05 % lotion Apply 1 application topically 2 (two) times a week. 01/23/20   [provider]  Cyanocobalamin 5000 MCG CAPS Take 5,000 mcg by mouth daily. Reported on 10/11/2015 10/11/15   Boykin Nearing, MD  hydrochlorothiazide (HYDRODIURIL) 12.5 MG tablet TAKE 1 DAILY AS NEEDED FOR LOWER EXTREMITY SWELLING 12/04/20   Ladell Pier, MD  HYDROcodone-acetaminophen (NORCO) 5-325 MG tablet Take 1 tablet by mouth 3 (three) times daily as needed. 12/07/20   Aundra Dubin, PA-C  levothyroxine (SYNTHROID) 100 MCG tablet TAKE 1 TABLET(100 MCG) BY MOUTH DAILY BEFORE AND BREAKFAST 12/04/20   Ladell Pier, MD  LUTEIN PO Take 1 tablet by mouth daily.    [provider]  methocarbamol (ROBAXIN) 500 MG tablet Take 1 tablet (500 mg total) by mouth every 8 (eight) hours as needed for muscle spasms. Patient not taking: Reported on 05/29/2019 02/19/19   Argentina Donovan, PA-C  Multiple Vitamins-Minerals (MULTIVITAMIN PO) Take 1 tablet by mouth daily.     [provider]  OVER THE COUNTER MEDICATION Take 1 tablet by mouth daily as needed (cold symtpoms). Reported on 10/11/2015    [provider]  potassium chloride SA (KLOR-CON) 20 MEQ tablet Take 1 tablet (20 mEq total) by mouth daily.  03/09/20   Petrucelli, Samantha R, PA-C  sodium chloride (OCEAN) 0.65 % SOLN nasal spray Place 1 spray into both nostrils as needed for congestion. Reported on 10/11/2015    [provider]    Family History Family History  Problem Relation Age of Onset  . Stroke Mother        questionable  . Clotting disorder Mother   . Heart attack Father     Social History Social History   Tobacco Use  . Smoking status: Never Smoker  . Smokeless tobacco: Never Used  Substance Use Topics  . Alcohol use: Yes    Comment: 1/4 glass per day  . Drug use: No     Allergies   Elemental sulfur, Milk-related compounds, Poison ivy extract [poison ivy extract], Tobacco [tobacco], and Orange juice [orange oil]   Review of Systems Review of Systems  Constitutional: Negative.   Gastrointestinal: Positive for abdominal pain, constipation and nausea. Negative for abdominal distention, rectal pain and vomiting.     Physical Exam Triage Vital Signs ED Triage Vitals  Enc Vitals Group     BP 12/07/20 1515 (!) 153/72     Pulse Rate 12/07/20 1515 70     Resp 12/07/20 1515 20     Temp 12/07/20 1515 (!) 97.4 F (36.3 C)     Temp Source 12/07/20 1515 Oral     SpO2 12/07/20 1515 97 %     Weight --      Height --      Head Circumference --      Peak Flow --      Pain Score 12/07/20 1511 7     Pain Loc --      Pain Edu? --      Excl. in [place]? --    No data found.  Updated Vital Signs BP (!) 153/72 (BP Location: Left Arm)   Pulse 70   Temp (!) 97.4 F (36.3 C) (Oral)   Resp 20   SpO2 97%   Visual Acuity Right Eye Distance:   Left Eye Distance:   Bilateral Distance:    Right Eye Near:   Left Eye Near:    Bilateral Near:     Physical Exam Vitals and nursing note reviewed.  Constitutional:      General: She is in acute distress.     Appearance: She is not ill-appearing.  Cardiovascular:     Rate and Rhythm: Normal rate and regular rhythm.  Pulmonary:     Effort: Pulmonary  effort is normal.     Breath sounds: Normal breath sounds.  Abdominal:     General: Bowel sounds are normal.     Tenderness: There is no abdominal tenderness.  Neurological:     Mental Status: She is alert.      UC Treatments / Results  Labs (all labs ordered are  listed, but only abnormal results are displayed) Labs Reviewed - No data to display  EKG   Radiology DG Abd 1 View  Result Date: 12/07/2020 CLINICAL DATA:  Constipation.  No bowel movement 9 days EXAM: ABDOMEN - 1 VIEW COMPARISON:  None. FINDINGS: Large volume stool in the rectum. Minimal stool in the descending colon and transverse colon. No dilated loops of small bowel. IMPRESSION: Large volume stool in the rectosigmoid colon.  No bowel obstruction. Electronically Signed   By: Suzy Bouchard M.D.   On: 12/07/2020 16:14   XR Wrist Complete Right  Result Date: 12/07/2020 Impression is displaced distal radius fracture with dorsal displacement   Procedures Procedures (including critical care time)  Medications Ordered in UC Medications - No data to display  Initial Impression / Assessment and Plan / UC Course  I have reviewed the triage vital signs and the nursing notes.  Pertinent labs & imaging results that were available during my care of the patient were reviewed by me and considered in my medical decision making (see chart for details).     1.  Slow transit constipation: KUB is remarkable for large volume in the rectosigmoid colon Patient was advised to go to the emergency room for an enema.  Patient does not want to go to the emergency room because of long wait. I offered patient Dulcolax suppository and advised her to continue MiraLAX Increase oral fluid intake Zofran was given to use as needed for nausea Return to ED precautions given. Final Clinical Impressions(s) / UC Diagnoses   Final diagnoses:  Slow transit constipation     Discharge Instructions     Use medications as  prescribed Increase oral fluid intake Increase fiber intake If experienced abdominal distention, vomiting or worsening abdominal pain please go to the emergency department to be evaluated.   ED Prescriptions    Medication Sig Dispense Auth. Provider   polyethylene glycol (MIRALAX) 17 g packet Take 17 g by mouth daily. 28 each Lamptey, Myrene Galas, MD   bisacodyl (DULCOLAX) 10 MG suppository Place 1 suppository (10 mg total) rectally as needed for moderate constipation. 12 suppository Lamptey, Myrene Galas, MD   ondansetron (ZOFRAN ODT) 4 MG disintegrating tablet Take 1 tablet (4 mg total) by mouth every 8 (eight) hours as needed for nausea or vomiting. 20 tablet Lamptey, Myrene Galas, MD     PDMP not reviewed this encounter.   Chase Picket, MD 12/07/20 [phone]

## 2020-12-07 NOTE — Discharge Instructions (Addendum)
Use medications as prescribed Increase oral fluid intake Increase fiber intake If experienced abdominal distention, vomiting or worsening abdominal pain please go to the emergency department to be evaluated.

## 2020-12-07 NOTE — Progress Notes (Signed)
Office Visit Note   Patient: Krystal Moore           Date of Birth: 16-Nov-1929           MRN: [id number] Visit Date: 12/07/2020              Requested by: Ladell Pier, MD [address] [place],  [place] [address] PCP: Ladell Pier, MD   Assessment & Plan: Visit Diagnoses:  1. Pain in right wrist     Plan: Impression is dorsal Barton's fracture with mild radial height loss.  Based on treatment options and overall function at baseline we agreed to attempt nonoperative treatment.  Sugar tong splint applied today.  She will ice and elevate at all times.  Recheck next week with repeat two-view x-rays of the right wrist out of the splint.  We called in a small supply of hydrocodone.  Total face to face encounter time was greater than 45 minutes and over half of this time was spent in counseling and/or coordination of care.  Follow-Up Instructions: Return in about 1 week (around 12/14/2020).   Orders:  Orders Placed This Encounter  Procedures  . XR Wrist Complete Right   Meds ordered this encounter  Medications  . DISCONTD: HYDROcodone-acetaminophen (NORCO) 5-325 MG tablet    Sig: Take 1 tablet by mouth every 6 (six) hours as needed.    Dispense:  10 tablet    Refill:  0  . HYDROcodone-acetaminophen (NORCO) 5-325 MG tablet    Sig: Take 1 tablet by mouth 3 (three) times daily as needed.    Dispense:  30 tablet    Refill:  0      Procedures: No procedures performed   Clinical Data: No additional findings.   Subjective: Chief Complaint  Patient presents with  . Right Wrist - Pain    HPI patient is a pleasant 87 year old female who comes in today following an injury to her right wrist.  Approximately 10 days ago, she was walking her dog, going up a set of stairs when her dog pulled causing her to fall forward landing on her right wrist.  She was seen in the ED where x-rays were obtained.  X-rays demonstrated a displaced distal radius fracture.   She was placed in a removable splint.  She comes in today for further evaluation treatment recommendation.  The pain she has is actually to the dorsum of the hand where she has quite a bit of swelling.  She has been taking Norco with mild relief of symptoms.  She does note burning to her fingers.  Review of Systems as detailed in HPI.  All others reviewed and are negative.   Objective: Vital Signs: There were no vitals taken for this visit.  Physical Exam well-developed well-nourished female no acute distress.  Alert and oriented x3.  Ortho Exam right wrist exam shows marked swelling to the dorsum of the hand.  She has mild diffuse tenderness throughout the wrist.  Moderate ecchymosis throughout.  She is neurovascular intact distally.  Specialty Comments:  No specialty comments available.  Imaging: DG Abd 1 View  Result Date: 12/07/2020 CLINICAL DATA:  Constipation.  No bowel movement 9 days EXAM: ABDOMEN - 1 VIEW COMPARISON:  None. FINDINGS: Large volume stool in the rectum. Minimal stool in the descending colon and transverse colon. No dilated loops of small bowel. IMPRESSION: Large volume stool in the rectosigmoid colon.  No bowel obstruction. Electronically Signed   By: Suzy Bouchard  M.D.   On: 12/07/2020 16:14   XR Wrist Complete Right  Result Date: 12/07/2020 Dorsal Carron Brazen fracture with mild radial height loss    PMFS History: Patient Active Problem List   Diagnosis Date Noted  . Numbness 07/30/2019  . Spinal stenosis of lumbar region 07/30/2019  . Osteopenia after menopause 05/29/2019  . Over weight 05/29/2019  . Elevated blood pressure reading 05/29/2019  . Immunization due 08/03/2017  . Left leg swelling 05/04/2017  . Allergic rhinitis 10/11/2015  . Hypothyroidism 12/10/2013  . Peripheral neuropathy 05/16/2013   Past Medical History:  Diagnosis Date  . Anemia   . Narcolepsy   . Neuropathy   . Obesity   . Pneumonia   . Polyneuropathy in other diseases  classified elsewhere ([place])   . Shoulder fracture, left     Family History  Problem Relation Age of Onset  . Stroke Mother        questionable  . Clotting disorder Mother   . Heart attack Father     Past Surgical History:  Procedure Laterality Date  . CATARACT EXTRACTION Left 06/2019  . MOUTH SURGERY    . NASAL SINUS SURGERY     Social History   Occupational History  . Not on file  Tobacco Use  . Smoking status: Never Smoker  . Smokeless tobacco: Never Used  Substance and Sexual Activity  . Alcohol use: Yes    Comment: 1/4 glass per day  . Drug use: No  . Sexual activity: Not Currently

## 2020-12-07 NOTE — Telephone Encounter (Signed)
Pt called in c/o not having a BM for 9 days.  She has tried OTC Miralax, [place], a fiber drink, drinking a lot of water and walking to no avail. She is on hydrocodone for pain due to a right wrist fracture on 11/28/2020.   She saw the ortho dr this morning who instructed her to call her PCP regarding the constipation.  She is c/o her abd being swollen, tight and having rectal pain "like knives down there".  "I'm swollen down there from straining so much".   "Nothing has come out in 9 days".  There are no openings at [facility].   I have instructed her to go to the [facility] which she is agreeable to going.   She said she is not going back to the ED because the wait was hours and hours when she was there for her wrist.   There is someone with her that can take her now.  She has tried all the care advice and the OTC medications are not helping.   Reason for Disposition . [1] Rectal pain or fullness from fecal impaction (rectum full of stool) AND [2] NOT better after SITZ bath, suppository or enema  Answer Assessment - Initial Assessment Questions 1. STOOL PATTERN OR FREQUENCY: "How often do you pass bowel movements (BMs)?"  (Normal range: tid to q 3 days)  "When was the last BM passed?"       I'm so constipated.   I've been on pain medicine for 9 days for my wrist.   (Fractured wrist).   I'm on hydrocodone. 2. STRAINING: "Do you have to strain to have a BM?"      Yes   I'm on the toilet now.   It feels like knives in my bottom.   Since Wed.  I've taken Miralax, Sennca stool softener and a fiber drink.   I'm drinking a lot of water too. 3. RECTAL PAIN: "Does your rectum hurt when the stool comes out?" If Yes, ask: "Do you have hemorrhoids? How bad is the pain?"  (Scale 1-10; or mild, moderate, severe)     Yes  It feels like knives in my bottom.   4. STOOL COMPOSITION: "Are the stools hard?"      Can't pass any stool 5. BLOOD ON STOOLS: "Has there been any blood on  the toilet tissue or on the surface of the BM?" If Yes, ask: "When was the last time?"      None noted on tissue.   No stool passed.   6. CHRONIC CONSTIPATION: "Is this a new problem for you?"  If no, ask: "How long have you had this problem?" (days, weeks, months)      Yes since being on the hydrocodone 9 days. 7. CHANGES IN DIET OR HYDRATION: "Have there been any recent changes in your diet?" "How much fluids are you drinking consuming on a daily basis?"  "How much have you had to drink today?"     I'm having nausea.   This morning I haven't eaten anything due to the nausea.  I saw Dr. Erlinda Hong for my wrist this morning.   He told me to call my primary doctor about the constipation. 8. MEDICATIONS: "Have you been taking any new medications?" "Are you taking any narcotic pain medications?" (e.g., Vicoden, Percocet, morphine, dilaudid)     Hydrocodone.    Pharmacist told me to take the sennica. 9. LAXATIVES: "Have you been using any stool softeners, laxatives,  or enemas?"  If yes, ask "What, how often, and when was the last time?" 10.ACTIVITY:  "How much walking do you do every day? on a daily basis?"  "Has your activity level decreased in the past week?"        I have been walking because it hurts to sit down. 11. CAUSE: "What do you think is causing the constipation?"        hydrocodone 12. OTHER SYMPTOMS: "Do you have any other symptoms?" (e.g., abdominal pain, bloating, fever, vomiting)       My abd is hard and bloated.  Having cramps. 13. MEDICAL HISTORY: "Do you have a history of hemorrhoids, rectal fissures, or rectal surgery or rectal abscess?"         No 14. PREGNANCY: "Is there any chance you are pregnant?" "When was your last menstrual period?"       N/A due to age  Protocols used: [place]

## 2020-12-08 NOTE — Telephone Encounter (Signed)
Returned pt call. Pt states she went to the urgent care yesterday and they provided her a laxative. Pt states she had a bowel movement yesterday.. Pt states she didn't know the pain medicine would make her constipated. Pt doesn't have any other concerns.

## 2020-12-14 ENCOUNTER — Ambulatory Visit (INDEPENDENT_AMBULATORY_CARE_PROVIDER_SITE_OTHER): Payer: PPO | Admitting: Orthopaedic Surgery

## 2020-12-14 ENCOUNTER — Ambulatory Visit (INDEPENDENT_AMBULATORY_CARE_PROVIDER_SITE_OTHER): Payer: PPO

## 2020-12-14 ENCOUNTER — Encounter: Payer: Self-pay | Admitting: Orthopaedic Surgery

## 2020-12-14 DIAGNOSIS — M25531 Pain in right wrist: Secondary | ICD-10-CM | POA: Diagnosis not present

## 2020-12-14 NOTE — Progress Notes (Signed)
    Patient: Krystal Moore           Date of Birth: 05-Jan-1930           MRN: [id number] Visit Date: 12/14/2020 PCP: Ladell Pier, MD   Assessment & Plan:  Chief Complaint:  Chief Complaint  Patient presents with  . Right Wrist - Pain   Visit Diagnoses:  1. Pain in right wrist     Plan:   Makenly returns today for right distal radius fracture.  Mainly complaining of numbness and swelling in all of her fingers.  Right hand shows moderate swelling.  No neurovascular compromise.  Swelling of the wrist is significantly improved.  No skin compromise.  X-rays demonstrate stable alignment of a dorsal Barton distal radius fracture.  We will continue with nonoperative treatment.  Now that the swelling has gone down quite a bit we will place her in a short arm cast.  She will elevate at all times.  Nonweightbearing.  Follow-up in 2 weeks with two-view x-rays of the right wrist in the cast.  Follow-Up Instructions: Return in about 2 weeks (around 12/28/2020).   Orders:  Orders Placed This Encounter  Procedures  . XR Wrist 2 Views Right   No orders of the defined types were placed in this encounter.   Imaging: XR Wrist 2 Views Right  Result Date: 12/14/2020 Stable dorsal barton fracture without interval worsening.   PMFS History: Patient Active Problem List   Diagnosis Date Noted  . Numbness 07/30/2019  . Spinal stenosis of lumbar region 07/30/2019  . Osteopenia after menopause 05/29/2019  . Over weight 05/29/2019  . Elevated blood pressure reading 05/29/2019  . Immunization due 08/03/2017  . Left leg swelling 05/04/2017  . Allergic rhinitis 10/11/2015  . Hypothyroidism 12/10/2013  . Peripheral neuropathy 05/16/2013   Past Medical History:  Diagnosis Date  . Anemia   . Narcolepsy   . Neuropathy   . Obesity   . Pneumonia   . Polyneuropathy in other diseases classified elsewhere ([place])   . Shoulder fracture, left     Family History  Problem Relation  Age of Onset  . Stroke Mother        questionable  . Clotting disorder Mother   . Heart attack Father     Past Surgical History:  Procedure Laterality Date  . CATARACT EXTRACTION Left 06/2019  . MOUTH SURGERY    . NASAL SINUS SURGERY     Social History   Occupational History  . Not on file  Tobacco Use  . Smoking status: Never Smoker  . Smokeless tobacco: Never Used  Substance and Sexual Activity  . Alcohol use: Yes    Comment: 1/4 glass per day  . Drug use: No  . Sexual activity: Not Currently

## 2020-12-20 ENCOUNTER — Ambulatory Visit: Payer: PPO | Attending: Nurse Practitioner | Admitting: Nurse Practitioner

## 2020-12-20 ENCOUNTER — Other Ambulatory Visit: Payer: Self-pay

## 2020-12-20 DIAGNOSIS — Z09 Encounter for follow-up examination after completed treatment for conditions other than malignant neoplasm: Secondary | ICD-10-CM

## 2020-12-20 DIAGNOSIS — M25531 Pain in right wrist: Secondary | ICD-10-CM | POA: Diagnosis not present

## 2020-12-20 NOTE — Progress Notes (Signed)
Virtual Visit via Telephone Note Due to national recommendations of social distancing due to [place] 19, telehealth visit is felt to be most appropriate for this patient at this time.  I discussed the limitations, risks, security and privacy concerns of performing an evaluation and management service by telephone and the availability of in person appointments. I also discussed with the patient that there may be a patient responsible charge related to this service. The patient expressed understanding and agreed to proceed.    I connected with Krystal Moore on 12/20/20  at   4:10 PM EST  EDT by telephone and verified that I am speaking with the correct person using two identifiers.   Consent I discussed the limitations, risks, security and privacy concerns of performing an evaluation and management service by telephone and the availability of in person appointments. I also discussed with the patient that there may be a patient responsible charge related to this service. The patient expressed understanding and agreed to proceed.   Location of Patient: Private Residence   Location of Provider: [place] [facility] [employer] Office    Persons participating in Telemedicine visit: Krystal Rankins FNP-BC [place]    History of Present Illness: Telemedicine visit for: HFU  She was evaluated in the emergency room on 11/28/2020 after a fall.  She was walking her dog and climbing the steps when the dog suddenly pulled her forward.  Unfortunately her face and right arm caught most of the fall.  Right wrist impression was dorsal Barton's fracture with mild radial height loss.  Ortho recommended nonoperative treatment and sugar tong splint was applied on her follow-up visit on 12/07/2020.    Today she is upset stating that her blood flow is compromised and she has numbness and tingling in her hand. States her "cast was applied incorrectly.she is also concerned about  a large bruise on her left cheek that does not seem to be worsening or increased in size however she is concerned that it has not resolved over the past 2 weeks.  She states she has a family member who is an Doctor, general practice at a teaching hospital who told her that her hand needs to be evaluated.  She does have a follow-up appointment with orthopedics tomorrow however I have encouraged her to give them a call today for evaluation of her hand.  Past Medical History:  Diagnosis Date  . Anemia   . Narcolepsy   . Neuropathy   . Obesity   . Pneumonia   . Polyneuropathy in other diseases classified elsewhere ([place])   . Shoulder fracture, left     Past Surgical History:  Procedure Laterality Date  . CATARACT EXTRACTION Left 06/2019  . MOUTH SURGERY    . NASAL SINUS SURGERY      Family History  Problem Relation Age of Onset  . Stroke Mother        questionable  . Clotting disorder Mother   . Heart attack Father     Social History   Socioeconomic History  . Marital status: Widowed    Spouse name: Not on file  . Number of children: 0  . Years of education: college  . Highest education level: Not on file  Occupational History  . Not on file  Tobacco Use  . Smoking status: Never Smoker  . Smokeless tobacco: Never Used  Substance and Sexual Activity  . Alcohol use: Yes    Comment: 1/4 glass per day  . Drug  use: No  . Sexual activity: Not Currently  Other Topics Concern  . Not on file  Social History Narrative   07/30/19 Patient lives at home and she has a roommate.    Has a poodle.    Patient works part time Advertising copywriter.   Patient has a college education.   Both handed. Plays organ since age 35.    Caffeine- None    Social Determinants of Health   Financial Resource Strain: Not on file  Food Insecurity: Not on file  Transportation Needs: Not on file  Physical Activity: Not on file  Stress: Not on file  Social Connections: Not on file      Observations/Objective: Awake, alert and oriented x 3   Review of Systems  Constitutional: Negative for fever, malaise/fatigue and weight loss.  HENT: Negative.  Negative for nosebleeds.   Eyes: Negative.  Negative for blurred vision, double vision and photophobia.  Respiratory: Negative.  Negative for cough and shortness of breath.   Cardiovascular: Negative.  Negative for chest pain, palpitations and leg swelling.  Gastrointestinal: Negative.  Negative for heartburn, nausea and vomiting.  Musculoskeletal: Positive for joint pain. Negative for myalgias.  Neurological: Negative.  Negative for dizziness, focal weakness, seizures and headaches.  Psychiatric/Behavioral: Negative.  Negative for suicidal ideas.    Assessment and Plan: [name] was seen today for hospitalization follow-up.  Diagnoses and all orders for this visit:  Hospital discharge follow-up Right wrist pain Call the orthopedic office for evaluation  Continue pain medication as prescribed Elevated RUE as often as possible   Follow Up Instructions Return if symptoms worsen or fail to improve.     I discussed the assessment and treatment plan with the patient. The patient was provided an opportunity to ask questions and all were answered. The patient agreed with the plan and demonstrated an understanding of the instructions.   The patient was advised to call back or seek an in-person evaluation if the symptoms worsen or if the condition fails to improve as anticipated.  I provided 13 minutes of non-face-to-face time during this encounter including median intraservice time, reviewing previous notes, labs, imaging, medications and explaining diagnosis and management.  Gildardo Pounds, FNP-BC

## 2020-12-21 ENCOUNTER — Encounter: Payer: Self-pay | Admitting: Nurse Practitioner

## 2020-12-21 ENCOUNTER — Ambulatory Visit (INDEPENDENT_AMBULATORY_CARE_PROVIDER_SITE_OTHER): Payer: PPO

## 2020-12-21 ENCOUNTER — Encounter: Payer: Self-pay | Admitting: Orthopaedic Surgery

## 2020-12-21 ENCOUNTER — Ambulatory Visit (INDEPENDENT_AMBULATORY_CARE_PROVIDER_SITE_OTHER): Payer: PPO | Admitting: Orthopaedic Surgery

## 2020-12-21 DIAGNOSIS — M25531 Pain in right wrist: Secondary | ICD-10-CM

## 2020-12-21 NOTE — Progress Notes (Signed)
   Office Visit Note   Patient: Krystal Moore           Date of Birth: 1930-05-17           MRN: [id number] Visit Date: 12/21/2020              Requested by: Ladell Pier, MD [address] [place],  [place] [address] PCP: Ladell Pier, MD   Assessment & Plan: Visit Diagnoses:  1. Pain in right wrist     Plan: Impression is 3 and half weeks status post right wrist Barton's fracture. We will place her in a removable splint.  Continue to elevate.  Follow up in 2 weeks for repeat xrays of the right wrist.   Follow-Up Instructions: Return in about 2 weeks (around 01/04/2021).   Orders:  Orders Placed This Encounter  Procedures  . XR Wrist 2 Views Right   No orders of the defined types were placed in this encounter.     Procedures: No procedures performed   Clinical Data: No additional findings.   Subjective: Chief Complaint  Patient presents with  . Right Wrist - Pain    HPI patient is a 87 year old female who comes in today 3 1/2 weeks out right wrist Barton's fracture.  She is here a week early complaining of pain and swelling as well as paresthesias from the cast.  She states that she has been elevating this.     Objective: Vital Signs: There were no vitals taken for this visit.    Ortho Exam right wrist exam shows marked swelling extending into the fingers.  Mild tenderness to the fracture site.  She is neurovascular intact distally.  Fingers are warm and well-perfused.  Range of motion of the wrist does not cause any significant pain.  Specialty Comments:  No specialty comments available.  Imaging: XR Wrist 2 Views Right  Result Date: 12/21/2020 X-rays reveal stable alignment of the fracture with evidence of bony consolidation    PMFS History: Patient Active Problem List   Diagnosis Date Noted  . Numbness 07/30/2019  . Spinal stenosis of lumbar region 07/30/2019  . Osteopenia after menopause 05/29/2019  . Over weight  05/29/2019  . Elevated blood pressure reading 05/29/2019  . Immunization due 08/03/2017  . Left leg swelling 05/04/2017  . Allergic rhinitis 10/11/2015  . Hypothyroidism 12/10/2013  . Peripheral neuropathy 05/16/2013   Past Medical History:  Diagnosis Date  . Anemia   . Narcolepsy   . Neuropathy   . Obesity   . Pneumonia   . Polyneuropathy in other diseases classified elsewhere ([place])   . Shoulder fracture, left     Family History  Problem Relation Age of Onset  . Stroke Mother        questionable  . Clotting disorder Mother   . Heart attack Father     Past Surgical History:  Procedure Laterality Date  . CATARACT EXTRACTION Left 06/2019  . MOUTH SURGERY    . NASAL SINUS SURGERY     Social History   Occupational History  . Not on file  Tobacco Use  . Smoking status: Never Smoker  . Smokeless tobacco: Never Used  Substance and Sexual Activity  . Alcohol use: Yes    Comment: 1/4 glass per day  . Drug use: No  . Sexual activity: Not Currently

## 2020-12-27 ENCOUNTER — Telehealth: Payer: Self-pay | Admitting: Internal Medicine

## 2020-12-27 NOTE — Telephone Encounter (Signed)
Will route to CMA

## 2020-12-27 NOTE — Telephone Encounter (Signed)
Copied from [place] [id number]. Topic: Referral - Request for Referral >> Dec 27, 2020 11:00 AM Tessa Lerner A wrote: Has patient seen PCP for this complaint? Yes  Referral for which specialty: Physical Rehab  Preferred provider/office: [facility] at [facility]  Reason for referral: Patient is experiencing swelling in their hand

## 2020-12-28 ENCOUNTER — Ambulatory Visit: Payer: PPO | Admitting: Orthopaedic Surgery

## 2020-12-28 NOTE — Telephone Encounter (Signed)
FYI   Returned pt call to go over Dr. [name] response. Pt states she doesn't need to talk to Dr. Erlinda Hong about anything. Pt states she doesn't want Dr. [name] or Dr. Erlinda Hong recommendations. Pt states she has an appt with Dr. Erlinda Hong next week.. Made pt aware that her appt was today but it was canceled by her for a sooner appt. Pt states that is a lie. Pt states she does have an appt next week. Made pt aware that our system is connected and I don't see an appt for next week. Made pt aware to that she will need to contact the ortho office to discuss appt. Pt states she will contact the ortho office

## 2021-01-03 ENCOUNTER — Other Ambulatory Visit: Payer: Self-pay | Admitting: Internal Medicine

## 2021-01-03 DIAGNOSIS — R600 Localized edema: Secondary | ICD-10-CM

## 2021-01-03 DIAGNOSIS — E039 Hypothyroidism, unspecified: Secondary | ICD-10-CM

## 2021-01-03 NOTE — Telephone Encounter (Signed)
Notes to clinic:  Patient schedule for appt on 02/14/2021 Review for another courtesy refill until appt    Requested Prescriptions  Pending Prescriptions Disp Refills   hydrochlorothiazide (HYDRODIURIL) 12.5 MG tablet [Pharmacy Med Name: HYDROCHLOROTHIAZIDE 12.5MG  TABLETS] 30 tablet 0    Sig: TAKE 1 TABLET BY MOUTH DAILY AS NEEDED FOR LOWER EXTREMITY SWELLING      Cardiovascular: Diuretics - Thiazide Failed - 01/03/2021  6:35 AM      Failed - K in normal range and within 360 days    Potassium  Date Value Ref Range Status  03/09/2020 3.3 (L) 3.5 - 5.1 mmol/L Final          Failed - Na in normal range and within 360 days    Sodium  Date Value Ref Range Status  03/09/2020 133 (L) 135 - 145 mmol/L Final  05/29/2019 138 134 - 144 mmol/L Final          Failed - Last BP in normal range    BP Readings from Last 1 Encounters:  12/07/20 (!) 153/72          Passed - Ca in normal range and within 360 days    Calcium  Date Value Ref Range Status  03/09/2020 9.7 8.9 - 10.3 mg/dL Final          Passed - Cr in normal range and within 360 days    Creatinine, Ser  Date Value Ref Range Status  03/09/2020 0.89 0.44 - 1.00 mg/dL Final          Passed - Valid encounter within last 6 months    Recent Outpatient Visits           2 weeks ago Hospital discharge follow-up   [place], Zelda W, NP   1 year ago Acute pain of right shoulder   [place], MD   1 year ago Elevated blood pressure reading   [place], Stephen L, RPH-CPP   1 year ago Over weight   [place] Ladell Pier, MD   1 year ago Muscle spasm   [place], Dionne Bucy, [place]       Future Appointments             In 1 month [name], Dalbert Batman, MD [place]                levothyroxine (SYNTHROID) 100 MCG tablet [Pharmacy Med Name: LEVOTHYROXINE 0.100MG  (100MCG) TAB] 30 tablet 0    Sig: TAKE 1 TABLET(100 MCG) BY MOUTH DAILY BEFORE AND BREAKFAST      Endocrinology:  Hypothyroid Agents Failed - 01/03/2021  6:35 AM      Failed - TSH needs to be rechecked within 3 months after an abnormal result. Refill until TSH is due.      Failed - TSH in normal range and within 360 days    TSH  Date Value Ref Range Status  12/08/2019 2.610 0.450 - 4.500 uIU/mL Final          Passed - Valid encounter within last 12 months    Recent Outpatient Visits           2 weeks ago Hospital discharge follow-up   [place], [name], NP   1 year ago Acute pain of right  shoulder   [place], MD   1 year ago Elevated blood pressure reading   [place], RPH-CPP   1 year ago Over weight   [place], MD   1 year ago Muscle spasm   [place], [place]       Future Appointments             In 1 month [name], Dalbert Batman, MD [place]

## 2021-01-04 ENCOUNTER — Ambulatory Visit (INDEPENDENT_AMBULATORY_CARE_PROVIDER_SITE_OTHER): Payer: PPO

## 2021-01-04 ENCOUNTER — Ambulatory Visit (INDEPENDENT_AMBULATORY_CARE_PROVIDER_SITE_OTHER): Payer: PPO | Admitting: Orthopaedic Surgery

## 2021-01-04 ENCOUNTER — Other Ambulatory Visit: Payer: Self-pay

## 2021-01-04 ENCOUNTER — Telehealth: Payer: Self-pay | Admitting: Physician Assistant

## 2021-01-04 ENCOUNTER — Other Ambulatory Visit: Payer: Self-pay | Admitting: Internal Medicine

## 2021-01-04 DIAGNOSIS — E039 Hypothyroidism, unspecified: Secondary | ICD-10-CM

## 2021-01-04 DIAGNOSIS — M25531 Pain in right wrist: Secondary | ICD-10-CM

## 2021-01-04 DIAGNOSIS — R600 Localized edema: Secondary | ICD-10-CM

## 2021-01-04 NOTE — Telephone Encounter (Signed)
yes

## 2021-01-04 NOTE — Telephone Encounter (Signed)
Are you ok if I start her in hand therapy?

## 2021-01-04 NOTE — Progress Notes (Signed)
Office Visit Note   Patient: Krystal Moore           Date of Birth: 06-11-1930           MRN: [id number] Visit Date: 01/04/2021              Requested by: Ladell Pier, MD [address] [place],  [place] [address] PCP: Ladell Pier, MD   Assessment & Plan: Visit Diagnoses:  1. Pain in right wrist     Plan: At this point, I would like for her to continue wearing her splint for now.  She may take this off to shower.  She will continue to elevate as much as possible.  She may work on finger range of motion.  We will go ahead and put in hand/wrist therapy.  She will follow up with [place] in 2 weeks time for repeat evaluation and 3 view x-rays of the right wrist.     Follow-Up Instructions: Return in about 2 weeks (around 01/18/2021).   Orders:  Orders Placed This Encounter  Procedures  . XR Wrist 2 Views Right   No orders of the defined types were placed in this encounter.     Procedures: No procedures performed   Clinical Data: No additional findings.   Subjective: Chief Complaint  Patient presents with  . Right Wrist - Pain    HPI patient is a pleasant 87 year old female who comes in today 5 and half weeks out right Barton's fracture.  She has been doing okay.  She has been compliant wearing her removable splint.  She still has marked swelling throughout the entire hand with associated paresthesias.  She is unable to make a full fist.      Objective: Vital Signs: There were no vitals taken for this visit.    Ortho Exam Examination of her right hand reveals continued moderate swelling throughout.  Fingers are warm and well-perfused.  She does have pain when trying to make a fist.  Specialty Comments:  No specialty comments available.  Imaging: XR Wrist 2 Views Right  Result Date: 01/04/2021 Continued consolidation of the fracture site.    PMFS History: Patient Active Problem List   Diagnosis Date Noted  . Numbness 07/30/2019  .  Spinal stenosis of lumbar region 07/30/2019  . Osteopenia after menopause 05/29/2019  . Over weight 05/29/2019  . Elevated blood pressure reading 05/29/2019  . Immunization due 08/03/2017  . Left leg swelling 05/04/2017  . Allergic rhinitis 10/11/2015  . Hypothyroidism 12/10/2013  . Peripheral neuropathy 05/16/2013   Past Medical History:  Diagnosis Date  . Anemia   . Narcolepsy   . Neuropathy   . Obesity   . Pneumonia   . Polyneuropathy in other diseases classified elsewhere ([place])   . Shoulder fracture, left     Family History  Problem Relation Age of Onset  . Stroke Mother        questionable  . Clotting disorder Mother   . Heart attack Father     Past Surgical History:  Procedure Laterality Date  . CATARACT EXTRACTION Left 06/2019  . MOUTH SURGERY    . NASAL SINUS SURGERY     Social History   Occupational History  . Not on file  Tobacco Use  . Smoking status: Never Smoker  . Smokeless tobacco: Never Used  Substance and Sexual Activity  . Alcohol use: Yes    Comment: 1/4 glass per day  . Drug use: No  . Sexual  activity: Not Currently

## 2021-01-05 ENCOUNTER — Other Ambulatory Visit: Payer: Self-pay | Admitting: Physician Assistant

## 2021-01-05 DIAGNOSIS — M25531 Pain in right wrist: Secondary | ICD-10-CM

## 2021-01-05 NOTE — Telephone Encounter (Signed)
[place], thanks.  Kathlee Nations, can you let patient know that we will go ahead and start hand therapy and that I have sent in a referral?

## 2021-01-05 NOTE — Telephone Encounter (Signed)
Patient aware.

## 2021-01-07 ENCOUNTER — Telehealth: Payer: Self-pay | Admitting: Orthopaedic Surgery

## 2021-01-07 NOTE — Telephone Encounter (Signed)
PT Order already in.

## 2021-01-07 NOTE — Telephone Encounter (Signed)
Spoke with patient she asked if she can go to [facility] for Therapy? The number to contact patient is [phone]

## 2021-01-19 ENCOUNTER — Other Ambulatory Visit: Payer: Self-pay

## 2021-01-19 ENCOUNTER — Encounter: Payer: Self-pay | Admitting: Occupational Therapy

## 2021-01-19 ENCOUNTER — Ambulatory Visit: Payer: PPO | Attending: Physician Assistant | Admitting: Occupational Therapy

## 2021-01-19 DIAGNOSIS — M25631 Stiffness of right wrist, not elsewhere classified: Secondary | ICD-10-CM | POA: Diagnosis not present

## 2021-01-19 DIAGNOSIS — M25641 Stiffness of right hand, not elsewhere classified: Secondary | ICD-10-CM | POA: Diagnosis not present

## 2021-01-19 DIAGNOSIS — M6281 Muscle weakness (generalized): Secondary | ICD-10-CM | POA: Diagnosis not present

## 2021-01-19 DIAGNOSIS — R600 Localized edema: Secondary | ICD-10-CM | POA: Insufficient documentation

## 2021-01-19 DIAGNOSIS — R208 Other disturbances of skin sensation: Secondary | ICD-10-CM | POA: Diagnosis not present

## 2021-01-19 NOTE — Therapy (Signed)
[facility] [address] [place], [place], [address] Phone: [phone]   Fax:  [phone]  Occupational Therapy Evaluation  Patient Details  Name: Krystal Moore MRN: [id number] Date of Birth: 1982-08-06 Referring Provider (OT): [name], [place]   Encounter Date: 01/19/2021   OT End of Session - 01/19/21 1055    Visit Number 1    Number of Visits 17    Date for OT Re-Evaluation 04/04/21    Authorization Type HTA No auth - VL:[id number]    Progress Note Due on Visit 10    OT Start Time 0950    OT Stop Time 1045    OT Time Calculation (min) 55 min    Equipment Utilized During Treatment Moist heat    Activity Tolerance Patient tolerated treatment well    Behavior During Therapy [facility] for tasks assessed/performed           Past Medical History:  Diagnosis Date  . Anemia   . Narcolepsy   . Neuropathy   . Obesity   . Pneumonia   . Polyneuropathy in other diseases classified elsewhere ([place])   . Shoulder fracture, left     Past Surgical History:  Procedure Laterality Date  . CATARACT EXTRACTION Left 06/2019  . MOUTH SURGERY    . NASAL SINUS SURGERY      There were no vitals filed for this visit.   Subjective Assessment - 01/19/21 1050    Subjective  I am a professional musician, and I need my hand back    Currently in Pain? No/denies   Reports burning and stinging and pressure            [facility] OT Assessment - 01/19/21 0001      Assessment   Medical Diagnosis Wrist pain    Referring Provider (OT) [name], PA-C    Onset Date/Surgical Date 11/28/20    Hand Dominance Right    Prior Therapy NA      Balance Screen   Has the patient fallen in the past 6 months Yes    How many times? 1      Prior Function   Level of Independence Independent with basic ADLs    Vocation Self employed   Musician   Vocation Requirements Plays piano organ, piano, cello    Leisure swims, walks her dog,  church      ADL   Eating/Feeding Minimal assistance   Can't cut food, can't use right hand   Grooming --   not using right hand   Upper Body Bathing --   not using right hand   Lower Body Bathing --   not using right hand   Upper Body Dressing Minimal assistance    Lower Body Dressing Increased time    ADL comments Demonstrated edema massage, and moist heat.  Issued finger sleeves to help sleep at night without fingers touching      IADL   Prior Level of Function Light Housekeeping Independent    Light Housekeeping Performs light daily tasks such as dishwashing, bed making    Prior Level of Function Meal Prep Independent    Meal Prep --   Limited cooking currently, unable to open cans, packages     Written Expression   Dominant Hand Right    Handwriting Increased time;90% legible      Activity Tolerance   Activity Tolerance Endurance does not limit participation in activity      Cognition   Overall Cognitive  Status Within Functional Limits for tasks assessed      Observation/Other Assessments   Focus on Therapeutic Outcomes (FOTO)  na      Posture/Postural Control   Posture/Postural Control No significant limitations      Sensation   Light Touch Impaired by gross assessment    Stereognosis Not tested    Hot/Cold Not tested      Coordination   Gross Motor Movements are Fluid and Coordinated Yes    Fine Motor Movements are Fluid and Coordinated No    9 Hole Peg Test Right;Left    Right 9 Hole Peg Test 1:08:47    Left 9 Hole Peg Test 27.47    Coordination Limited by stiffness aand edema - thumb, digits, wrist and forearm      Perception   Perception Within Functional Limits      Praxis   Praxis Intact      Edema   Edema Mild - dorsum MCP, palm, volar wrist      Right Hand AROM   R Thumb Opposition to Index --   unable to oppose to tip of 4th/5th   R Index  MCP 0-90 60 Degrees    R Index PIP 0-100 60 Degrees    R Long  MCP 0-90 60 Degrees    R Long PIP 0-100 65  Degrees    R Ring  MCP 0-90 70 Degrees    R Ring PIP 0-100 70 Degrees    R Little  MCP 0-90 65 Degrees    R Little PIP 0-100 80 Degrees      Hand Function   Right Hand Gross Grasp Impaired    Right Hand Grip (lbs) 4    Right Hand Lateral Pinch 4 lbs    Left Hand Gross Grasp Functional    Left Hand Grip (lbs) 30    Left Hand Lateral Pinch 12 lbs                           OT Education - 01/19/21 1054    Education Details edeman massage/management, use of moist heat prior to stretch, and composite flex/ext exercise    Person(s) Educated Patient    Methods Explanation;Demonstration    Comprehension Verbalized understanding;Need further instruction            OT Short Term Goals - 01/19/21 1206      OT SHORT TERM GOAL #1   Title Patient will demonstrate effective edema management techniques due 03/05/21    Time 4    Period Weeks    Status New    Target Date 03/05/21      OT SHORT TERM GOAL #2   Title Patient will complete a home exercise program designed to improve active and passive range of motion in forearm, wrist, and digits    Time 4    Period Weeks    Status New      OT SHORT TERM GOAL #3   Title Patient will demonstrate ability to contact pads 2-5  of fingers to palm to help facilitate gross grasp    Time 4    Period Weeks    Status New      OT SHORT TERM GOAL #4   Title Patient will demonstrate ability to oppose thumb to 4th and 5th digit tips with min assist    Time 4    Period Weeks    Status New  OT Long Term Goals - 01/19/21 1210      OT LONG TERM GOAL #1   Title Patient will complete HEP designed to improve functional strength and use of dominant RUE DUE 6/13    Time 8    Period Weeks    Status New    Target Date 04/04/21      OT LONG TERM GOAL #2   Title Patient will demonstrate 90% composite flexion and extension right hand    Time 8    Period Weeks    Status New      OT LONG TERM GOAL #3   Title Patient  will demonstrate 10 lb increase in grip strength in right hand to help with functional grip and carry    Time 8    Period Weeks    Status New      OT LONG TERM GOAL #4   Title Patient will demonstrate sufficient stamina and strength/range of motion in right hand 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    Time 8    Period Weeks    Status New      OT LONG TERM GOAL #5   Title Patient will demonstrate at least 5 lb increase in pinch strength in right hand to aide with opening packages, holding small items    Time 8    Period Weeks    Status New                 Plan - 01/19/21 1057    Clinical Impression Statement Patient is an 87 yr old very active woman who fell on 2/6 suffering a distal radius fracture. Fracture has been managed non-operatively and patient recently has had splint discontinued.  Patient presents to OT eval with mild edema in hand , wrist and forearm, decreased passive and active range of motion of fingers, wrist and forearm, decreased sensation, and decreased strength in dominant right hand.  Patient is a Designer, television/film set, and is still performing, so very eager to get functional use of her hand back.  Patient will benefit from skilled OT intervention to improve range of motion and functional strength in dominant right hand.    OT Occupational Profile and History Problem Focused Assessment - Including review of records relating to presenting problem    Occupational performance deficits (Please refer to evaluation for details): ADL's;IADL's;Work;Leisure    Body Structure / Function / Physical Skills ADL;Coordination;UE functional use;Sensation;Fascial restriction;Decreased knowledge of precautions;Decreased knowledge of use of DME;Flexibility;IADL;Pain;Skin integrity;Strength;FMC;Dexterity;Edema;Mobility;ROM    Rehab Potential Good    Clinical Decision Making Limited treatment options, no task modification necessary    Comorbidities Affecting  Occupational Performance: None    Modification or Assistance to Complete Evaluation  No modification of tasks or assist necessary to complete eval    OT Frequency 2x / week    OT Duration 8 weeks    OT Treatment/Interventions Self-care/ADL training;Therapeutic exercise;Patient/family education;Splinting;Compression bandaging;Moist Heat;Paraffin;Fluidtherapy;Therapeutic activities;Cryotherapy;Ultrasound;Contrast Bath;DME and/or AE instruction;Manual Therapy;Passive range of motion    Plan Fluido first than PROM digits, forearm, wrist and HEP    Consulted and Agree with Plan of Care Patient           Patient will benefit from skilled therapeutic intervention in order to improve the following deficits and impairments:   Body Structure / Function / Physical Skills: ADL,Coordination,UE functional use,Sensation,Fascial restriction,Decreased knowledge of precautions,Decreased knowledge of use of DME,Flexibility,IADL,Pain,Skin integrity,Strength,FMC,Dexterity,Edema,Mobility,ROM       Visit Diagnosis: Localized edema - Plan: Ot plan of  care cert/re-cert  Other disturbances of skin sensation - Plan: Ot plan of care cert/re-cert  Stiffness of right wrist, not elsewhere classified - Plan: Ot plan of care cert/re-cert  Stiffness of right hand, not elsewhere classified - Plan: Ot plan of care cert/re-cert  Muscle weakness (generalized) - Plan: Ot plan of care cert/re-cert    Problem List Patient Active Problem List   Diagnosis Date Noted  . Numbness 07/30/2019  . Spinal stenosis of lumbar region 07/30/2019  . Osteopenia after menopause 05/29/2019  . Over weight 05/29/2019  . Elevated blood pressure reading 05/29/2019  . Immunization due 08/03/2017  . Left leg swelling 05/04/2017  . Allergic rhinitis 10/11/2015  . Hypothyroidism 12/10/2013  . Peripheral neuropathy 05/16/2013    Mariah Milling, OTR/L 01/19/2021, 12:18 PM  [facility] [address] [place], [place], [address] Phone: [phone]   Fax:  [phone]  Name: Buford Gayler MRN: [id number] Date of Birth: Feb 24, 1930

## 2021-01-21 ENCOUNTER — Other Ambulatory Visit: Payer: Self-pay

## 2021-01-21 ENCOUNTER — Ambulatory Visit: Payer: PPO | Attending: Physician Assistant | Admitting: Occupational Therapy

## 2021-01-21 ENCOUNTER — Encounter: Payer: Self-pay | Admitting: Occupational Therapy

## 2021-01-21 DIAGNOSIS — M6281 Muscle weakness (generalized): Secondary | ICD-10-CM | POA: Insufficient documentation

## 2021-01-21 DIAGNOSIS — R600 Localized edema: Secondary | ICD-10-CM | POA: Diagnosis not present

## 2021-01-21 DIAGNOSIS — M25641 Stiffness of right hand, not elsewhere classified: Secondary | ICD-10-CM | POA: Diagnosis not present

## 2021-01-21 DIAGNOSIS — M25631 Stiffness of right wrist, not elsewhere classified: Secondary | ICD-10-CM | POA: Insufficient documentation

## 2021-01-21 DIAGNOSIS — R208 Other disturbances of skin sensation: Secondary | ICD-10-CM | POA: Diagnosis not present

## 2021-01-21 NOTE — Therapy (Signed)
[facility] [address] [place], [place], [address] Phone: [phone]   Fax:  [phone]  Occupational Therapy Treatment  Patient Details  Name: Ekta Dancer MRN: [id number] Date of Birth: March 23, 1930 Referring Provider (OT): [name], [place]   Encounter Date: 01/21/2021   OT End of Session - 01/21/21 1317    Visit Number 2    Number of Visits 17    Date for OT Re-Evaluation 04/04/21    Authorization Type HTA No auth - VL:[id number]    Authorization - Visit Number 1    Progress Note Due on Visit 10    OT Start Time 1315    OT Stop Time 1357    OT Time Calculation (min) 42 min    Equipment Utilized During Treatment fluidotherapy    Activity Tolerance Patient tolerated treatment well    Behavior During Therapy [facility] for tasks assessed/performed           Past Medical History:  Diagnosis Date  . Anemia   . Narcolepsy   . Neuropathy   . Obesity   . Pneumonia   . Polyneuropathy in other diseases classified elsewhere ([place])   . Shoulder fracture, left     Past Surgical History:  Procedure Laterality Date  . CATARACT EXTRACTION Left 06/2019  . MOUTH SURGERY    . NASAL SINUS SURGERY      There were no vitals filed for this visit.   Subjective Assessment - 01/21/21 1318    Subjective  "I don't have pain but I have to take pain medication for the burning in the fingers"    Currently in Pain? Yes    Pain Score 8     Pain Location Finger (Comment which one)   all 5   Pain Orientation Right    Pain Descriptors / Indicators Burning    Pain Type Acute pain    Pain Onset 1 to 4 weeks ago    Pain Frequency Constant            Fluidotherapy x 12 minutes for RUE for increase in pain relief and edema management before PROM for stiffness. No adverse reactions.  A/PROM of RUE composite flexion/extension Self PROM of each digit for P/DIPs and MCP AROM thumb opposition to digits  Functional  grasp/release with use of RUE for picking up and placing connect 4 chips into frame/board. With min difficulty. Pt with difficulty at first d/t lack of sensation in fingertips of long and pointer finger but activity became easier as went on. Flipping Cards with RUE for increase in coordination and functional movement of hand.                  OT Short Term Goals - 01/19/21 1206      OT SHORT TERM GOAL #1   Title Patient will demonstrate effective edema management techniques due 03/05/21    Time 4    Period Weeks    Status New    Target Date 03/05/21      OT SHORT TERM GOAL #2   Title Patient will complete a home exercise program designed to improve active and passive range of motion in forearm, wrist, and digits    Time 4    Period Weeks    Status New      OT SHORT TERM GOAL #3   Title Patient will demonstrate ability to contact pads 2-5  of fingers to palm to help facilitate gross grasp  Time 4    Period Weeks    Status New      OT SHORT TERM GOAL #4   Title Patient will demonstrate ability to oppose thumb to 4th and 5th digit tips with min assist    Time 4    Period Weeks    Status New             OT Long Term Goals - 01/21/21 1338      OT LONG TERM GOAL #1   Title Patient will complete HEP designed to improve functional strength and use of dominant RUE DUE 6/13    Time 8    Period Weeks    Status On-going      OT LONG TERM GOAL #2   Title Patient will demonstrate 90% composite flexion and extension right hand    Time 8    Period Weeks    Status On-going      OT LONG TERM GOAL #3   Title Patient will demonstrate 10 lb increase in grip strength in right hand to help with functional grip and carry    Time 8    Period Weeks    Status New      OT LONG TERM GOAL #4   Title Patient will demonstrate sufficient stamina and strength/range of motion in right hand 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    Time 8    Period Weeks    Status  On-going      OT LONG TERM GOAL #5   Title Patient will demonstrate at least 5 lb increase in pinch strength in right hand to aide with opening packages, holding small items    Time 8    Period Weeks    Status New                  Patient will benefit from skilled therapeutic intervention in order to improve the following deficits and impairments:           Visit Diagnosis: Localized edema  Other disturbances of skin sensation  Stiffness of right wrist, not elsewhere classified  Stiffness of right hand, not elsewhere classified  Muscle weakness (generalized)    Problem List Patient Active Problem List   Diagnosis Date Noted  . Numbness 07/30/2019  . Spinal stenosis of lumbar region 07/30/2019  . Osteopenia after menopause 05/29/2019  . Over weight 05/29/2019  . Elevated blood pressure reading 05/29/2019  . Immunization due 08/03/2017  . Left leg swelling 05/04/2017  . Allergic rhinitis 10/11/2015  . Hypothyroidism 12/10/2013  . Peripheral neuropathy 05/16/2013    Zachery Conch MOT, OTR/L  01/21/2021, 2:01 PM  [facility] [address] [place], [place], [address] Phone: [phone]   Fax:  [phone]  Name: Armani Gawlik MRN: [id number] Date of Birth: Oct 01, 1930

## 2021-01-28 ENCOUNTER — Other Ambulatory Visit: Payer: Self-pay

## 2021-01-28 ENCOUNTER — Ambulatory Visit: Payer: PPO | Admitting: Occupational Therapy

## 2021-01-28 ENCOUNTER — Encounter: Payer: Self-pay | Admitting: Occupational Therapy

## 2021-01-28 DIAGNOSIS — M25631 Stiffness of right wrist, not elsewhere classified: Secondary | ICD-10-CM

## 2021-01-28 DIAGNOSIS — R600 Localized edema: Secondary | ICD-10-CM

## 2021-01-28 DIAGNOSIS — R208 Other disturbances of skin sensation: Secondary | ICD-10-CM

## 2021-01-28 DIAGNOSIS — M25641 Stiffness of right hand, not elsewhere classified: Secondary | ICD-10-CM

## 2021-01-28 DIAGNOSIS — M6281 Muscle weakness (generalized): Secondary | ICD-10-CM

## 2021-01-28 NOTE — Therapy (Signed)
[facility] [address] [place], [place], [address] Phone: [phone]   Fax:  [phone]  Occupational Therapy Treatment  Patient Details  Name: Krystal Moore MRN: [id number] Date of Birth: 05/27/1930 Referring Provider (OT): [name], [place]   Encounter Date: 01/28/2021   OT End of Session - 01/28/21 1102    Visit Number 3    Number of Visits 17    Date for OT Re-Evaluation 04/04/21    Authorization Type HTA No auth - VL:[id number]    Authorization - Visit Number 2    Progress Note Due on Visit 10    OT Start Time 1102    OT Stop Time 1142    OT Time Calculation (min) 40 min    Equipment Utilized During Treatment fluidotherapy    Activity Tolerance Patient tolerated treatment well    Behavior During Therapy [facility] for tasks assessed/performed           Past Medical History:  Diagnosis Date  . Anemia   . Narcolepsy   . Neuropathy   . Obesity   . Pneumonia   . Polyneuropathy in other diseases classified elsewhere ([place])   . Shoulder fracture, left     Past Surgical History:  Procedure Laterality Date  . CATARACT EXTRACTION Left 06/2019  . MOUTH SURGERY    . NASAL SINUS SURGERY      There were no vitals filed for this visit.   Subjective Assessment - 01/28/21 1103    Subjective  "i have been practicing (piano) for 2 hours consecutively daily but these three fingers are just burning all the time" "I still can't start the car"    Currently in Pain? Yes    Pain Score 8     Pain Location Finger (Comment which one)   2, 3 and 4   Pain Orientation Right    Pain Descriptors / Indicators Burning    Pain Type Neuropathic pain    Pain Onset 1 to 4 weeks ago    Pain Frequency Constant              TREATMENT:  Fluidotherapy x 12 minutes for RUE to address pain, swelling, and stiffness. No adverse reactions.   Pt reports she still can't start the car with her RUE. Pt has been practicing the  piano consistently however states that the burning sensation in her fingers in her RUE really bother her and her pinky to thumb range of motion is decreased impeding her piano playing ability. Pt only knows she is playing the keys cause she hears it.   A/ROM and PROM for RUE composite flex/ext, abduction/adduction of digits, wrist flex/ext, supination/pronation, 2, 3, and 4 digits of RUE  Semi Circle Pegboard with RUE with little difficulty with white pegs and red pegs. Pt had increased difficulty with gray pegs but with minimal drops. Removed with no difficulty.                   OT Short Term Goals - 01/28/21 1118      OT SHORT TERM GOAL #1   Title Patient will demonstrate effective edema management techniques due 03/05/21    Time 4    Period Weeks    Status On-going    Target Date 03/05/21      OT SHORT TERM GOAL #2   Title Patient will complete a home exercise program designed to improve active and passive range of motion in forearm, wrist, and digits  Time 4    Period Weeks    Status On-going      OT SHORT TERM GOAL #3   Title Patient will demonstrate ability to contact pads 2-5  of fingers to palm to help facilitate gross grasp    Time 4    Period Weeks    Status On-going      OT SHORT TERM GOAL #4   Title Patient will demonstrate ability to oppose thumb to 4th and 5th digit tips with min assist    Time 4    Period Weeks    Status Achieved   mod I            OT Long Term Goals - 01/21/21 1338      OT LONG TERM GOAL #1   Title Patient will complete HEP designed to improve functional strength and use of dominant RUE DUE 6/13    Time 8    Period Weeks    Status On-going      OT LONG TERM GOAL #2   Title Patient will demonstrate 90% composite flexion and extension right hand    Time 8    Period Weeks    Status On-going      OT LONG TERM GOAL #3   Title Patient will demonstrate 10 lb increase in grip strength in right hand to help with functional  grip and carry    Time 8    Period Weeks    Status New      OT LONG TERM GOAL #4   Title Patient will demonstrate sufficient stamina and strength/range of motion in right hand 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    OT Occupational Profile and History Problem Focused Assessment - Including review of records relating to presenting problem    Occupational performance deficits (Please refer to evaluation for details): ADL's;IADL's;Work;Leisure    Body Structure / Function / Physical Skills ADL;Coordination;UE functional use;Sensation;Fascial restriction;Decreased knowledge of precautions;Decreased knowledge of use of DME;Flexibility;IADL;Pain;Skin integrity;Strength;FMC;Dexterity;Edema;Mobility;ROM    Rehab Potential Good    Clinical Decision Making Limited treatment options, no task modification necessary    Comorbidities Affecting Occupational Performance: None    Modification or Assistance to Complete Evaluation  No modification of tasks or assist necessary to complete eval    OT Frequency 2x / week    OT Duration 8 weeks    OT Treatment/Interventions Self-care/ADL training;Therapeutic exercise;Patient/family education;Splinting;Compression bandaging;Moist Heat;Paraffin;Fluidtherapy;Therapeutic activities;Cryotherapy;Ultrasound;Contrast Bath;DME and/or AE instruction;Manual Therapy;Passive range of motion    Plan Fluido first than PROM digits, forearm, wrist and HEP, maybe short opponens splint?    Consulted and Agree with Plan of Care Patient           Patient will  benefit from skilled therapeutic intervention in order to improve the following deficits and impairments:   Body Structure / Function / Physical Skills: ADL,Coordination,UE functional use,Sensation,Fascial restriction,Decreased knowledge of precautions,Decreased knowledge of  use of DME,Flexibility,IADL,Pain,Skin integrity,Strength,FMC,Dexterity,Edema,Mobility,ROM       Visit Diagnosis: Localized edema  Stiffness of right hand, not elsewhere classified  Stiffness of right wrist, not elsewhere classified  Other disturbances of skin sensation  Muscle weakness (generalized)    Problem List Patient Active Problem List   Diagnosis Date Noted  . Numbness 07/30/2019  . Spinal stenosis of lumbar region 07/30/2019  . Osteopenia after menopause 05/29/2019  . Over weight 05/29/2019  . Elevated blood pressure reading 05/29/2019  . Immunization due 08/03/2017  . Left leg swelling 05/04/2017  . Allergic rhinitis 10/11/2015  . Hypothyroidism 12/10/2013  . Peripheral neuropathy 05/16/2013    Zachery Conch MOT, OTR/L  01/28/2021, 11:42 AM  [facility] [address] [place], [place], [address] Phone: [phone]   Fax:  [phone]  Name: Mossie Gilder MRN: [id number] Date of Birth: 1930-01-27

## 2021-02-01 ENCOUNTER — Ambulatory Visit: Payer: PPO | Admitting: Occupational Therapy

## 2021-02-03 ENCOUNTER — Ambulatory Visit: Payer: PPO | Admitting: Occupational Therapy

## 2021-02-03 ENCOUNTER — Encounter: Payer: Self-pay | Admitting: Occupational Therapy

## 2021-02-03 ENCOUNTER — Other Ambulatory Visit: Payer: Self-pay

## 2021-02-03 DIAGNOSIS — R600 Localized edema: Secondary | ICD-10-CM | POA: Diagnosis not present

## 2021-02-03 DIAGNOSIS — M6281 Muscle weakness (generalized): Secondary | ICD-10-CM

## 2021-02-03 DIAGNOSIS — M25631 Stiffness of right wrist, not elsewhere classified: Secondary | ICD-10-CM

## 2021-02-03 DIAGNOSIS — M25641 Stiffness of right hand, not elsewhere classified: Secondary | ICD-10-CM

## 2021-02-03 DIAGNOSIS — R208 Other disturbances of skin sensation: Secondary | ICD-10-CM

## 2021-02-03 NOTE — Therapy (Signed)
Krystal Moore [address] [place], [place], [address] Phone: [phone]   Fax:  [phone]  Occupational Therapy Treatment  Patient Details  Name: Krystal Moore MRN: [id number] Date of Birth: [birth year] Referring Provider (OT): [name], [place]   Encounter Date: 02/03/2021   OT End of Session - 02/03/21 1506    Visit Number 4    Number of Visits 17    Date for OT Re-Evaluation 04/04/21    Authorization Type HTA No auth - VL:MN    Authorization - Visit Number 3    Progress Note Due on Visit 10    OT Start Time 1400    OT Stop Time 1445    OT Time Calculation (min) 45 min    Equipment Utilized During Treatment fluidotherapy    Activity Tolerance Patient tolerated treatment well    Behavior During Therapy [facility] for tasks assessed/performed           Past Medical History:  Diagnosis Date  . Anemia   . Narcolepsy   . Neuropathy   . Obesity   . Pneumonia   . Polyneuropathy in other diseases classified elsewhere ([place])   . Shoulder fracture, left     Past Surgical History:  Procedure Laterality Date  . CATARACT EXTRACTION Left 06/2019  . MOUTH SURGERY    . NASAL SINUS SURGERY      There were no vitals filed for this visit.   Subjective Assessment - 02/03/21 1407    Subjective  Burning, prickling, stinging - extreme discomfort    Currently in Pain? No/denies                        OT Treatments/Exercises (OP) - 02/03/21 0001      ADLs   Grooming Patient unable to trim nails on left hand - lacks strength to pinch nail clippers with right hand.  Demo'd one handed nail clippers - patient given resources to purchase adapted nail brush and clippers.      Modalities   Modalities Fluidotherapy      RUE Fluidotherapy   Number Minutes Fluidotherapy 10 Minutes    RUE Fluidotherapy Location Hand;Wrist;Forearm    Comments Prior to manual therapy      Manual Therapy   Manual  Therapy Soft tissue mobilization;Joint mobilization    Joint Mobilization Gentle thumb mobilization/ carpal mobilization with palmar massage.                  OT Education - 02/03/21 1503    Education Details Nail care - adapted equipment    Person(s) Educated Patient    Methods Explanation;Demonstration;Handout    Comprehension Verbalized understanding;Returned demonstration            OT Short Term Goals - 02/03/21 1510      OT SHORT TERM GOAL #1   Title Patient will demonstrate effective edema management techniques due 03/05/21    Time 4    Period Weeks    Status On-going    Target Date 03/05/21      OT SHORT TERM GOAL #2   Title Patient will complete a home exercise program designed to improve active and passive range of motion in forearm, wrist, and digits    Time 4    Period Weeks    Status On-going      OT SHORT TERM GOAL #3   Title Patient will demonstrate ability to contact pads 2-5  of fingers to  palm to help facilitate gross grasp    Time 4    Period Weeks    Status On-going      OT SHORT TERM GOAL #4   Title Patient will demonstrate ability to oppose thumb to 4th and 5th digit tips with min assist    Time 4    Period Weeks    Status Achieved   mod I            OT Long Term Goals - 02/03/21 1510      OT LONG TERM GOAL #1   Title Patient will complete HEP designed to improve functional strength and use of dominant RUE DUE 6/13    Time 8    Period Weeks    Status On-going      OT LONG TERM GOAL #2   Title Patient will demonstrate 90% composite flexion and extension right hand    Time 8    Period Weeks    Status On-going      OT LONG TERM GOAL #3   Title Patient will demonstrate 10 lb increase in grip strength in right hand to help with functional grip and carry    Time 8    Period Weeks    Status New      OT LONG TERM GOAL #4   Title Patient will demonstrate sufficient stamina and strength/range of motion in right hand to 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  Patient still with consistent report of nerve pain digits 2-4    OT Occupational Profile and History Problem Focused Assessment - Including review of records relating to presenting problem    Occupational performance deficits (Please refer to evaluation for details): ADL's;IADL's;Work;Leisure    Body Structure / Function / Physical Skills ADL;Coordination;UE functional use;Sensation;Fascial restriction;Decreased knowledge of precautions;Decreased knowledge of use of DME;Flexibility;IADL;Pain;Skin integrity;Strength;FMC;Dexterity;Edema;Mobility;ROM    Rehab Potential Good    Clinical Decision Making Limited treatment options, no task modification necessary    Comorbidities Affecting Occupational Performance: None    Modification or Assistance to Complete Evaluation  No modification of tasks or assist necessary to complete eval    OT Frequency 2x / week    OT Duration 8 weeks    OT Treatment/Interventions Self-care/ADL training;Therapeutic exercise;Patient/family education;Splinting;Compression bandaging;Moist Heat;Paraffin;Fluidtherapy;Therapeutic activities;Cryotherapy;Ultrasound;Contrast Bath;DME and/or AE instruction;Manual Therapy;Passive range of motion    Plan Fluido first than PROM to AAROM  digits, forearm, wrist and HEP,    Consulted and Agree with Plan of Care Patient           Patient will benefit from skilled therapeutic intervention in order to improve the following deficits and impairments:   Body Structure / Function / Physical  Skills: ADL,Coordination,UE functional use,Sensation,Fascial restriction,Decreased knowledge of precautions,Decreased knowledge of use of DME,Flexibility,IADL,Pain,Skin integrity,Strength,FMC,Dexterity,Edema,Mobility,ROM       Visit Diagnosis: Stiffness of right hand, not elsewhere classified  Stiffness of right wrist, not  elsewhere classified  Other disturbances of skin sensation  Muscle weakness (generalized)  Localized edema    Problem List Patient Active Problem List   Diagnosis Date Noted  . Numbness 07/30/2019  . Spinal stenosis of lumbar region 07/30/2019  . Osteopenia after menopause 05/29/2019  . Over weight 05/29/2019  . Elevated blood pressure reading 05/29/2019  . Immunization due 08/03/2017  . Left leg swelling 05/04/2017  . Allergic rhinitis 10/11/2015  . Hypothyroidism 12/10/2013  . Peripheral neuropathy 05/16/2013    Mariah Milling, OTR/L 02/03/2021, 3:13 PM  [facility] [address] [place], [place], [address] Phone: [phone]   Fax:  [phone]  Name: Siona Coulston MRN: [id number] Date of Birth: 06-30-1930

## 2021-02-08 ENCOUNTER — Other Ambulatory Visit: Payer: Self-pay

## 2021-02-08 ENCOUNTER — Ambulatory Visit: Payer: PPO | Admitting: Occupational Therapy

## 2021-02-08 DIAGNOSIS — R208 Other disturbances of skin sensation: Secondary | ICD-10-CM

## 2021-02-08 DIAGNOSIS — M6281 Muscle weakness (generalized): Secondary | ICD-10-CM

## 2021-02-08 DIAGNOSIS — R600 Localized edema: Secondary | ICD-10-CM

## 2021-02-08 DIAGNOSIS — M25631 Stiffness of right wrist, not elsewhere classified: Secondary | ICD-10-CM

## 2021-02-08 DIAGNOSIS — M25641 Stiffness of right hand, not elsewhere classified: Secondary | ICD-10-CM

## 2021-02-08 NOTE — Patient Instructions (Addendum)
Wear compression glove at night time, remove if any problems  Flexor Tendon Gliding (Active Hook Fist)   With fingers and knuckles straight, bend middle and tip joints. Do not bend large knuckles. Repeat _10-15___ times. Do _4-6___ sessions per day.  MP Flexion (Active)   With back of hand on table, bend large knuckles as far as they will go, keeping small joints straight. Repeat _10-15___ times. Do __4-6__ sessions per day. Activity: Reach into a narrow container.*      Finger Flexion / Extension   With palm up, bend fingers of left hand toward palm, making a  fist. Straighten fingers, opening fist. Repeat sequence _10-15___ times per session. Do _4-6__ sessions per day. Hand Variation: Palm down   Copyright  VHI. All rights reserved.    Composite Extension (Passive Flexor Stretch)    Sitting with elbows on table and palms together, slowly lower wrists toward table until stretch is felt. Be sure to keep palms together throughout stretch. Hold __10__ seconds. Relax. Repeat _5___ times. Do __3__ sessions per day.  Copyright  VHI. All rights reserved. Wrist Passive Flexion    Rest right forearm on table, hand palm-down over edge. Bend wrist by pressing hand down with other hand. Hold __5__ seconds. Repeat _10___ times. Do __4__ sessions per day.  http://[email or web address].exer.us/157   Copyright  VHI. All rights reserved.

## 2021-02-09 ENCOUNTER — Ambulatory Visit: Payer: PPO | Attending: Internal Medicine | Admitting: Family Medicine

## 2021-02-09 ENCOUNTER — Encounter: Payer: Self-pay | Admitting: Family Medicine

## 2021-02-09 VITALS — BP 143/80 | HR 83 | Resp 17 | Ht 64.0 in | Wt 163.2 lb

## 2021-02-09 DIAGNOSIS — E039 Hypothyroidism, unspecified: Secondary | ICD-10-CM | POA: Diagnosis not present

## 2021-02-09 DIAGNOSIS — M792 Neuralgia and neuritis, unspecified: Secondary | ICD-10-CM

## 2021-02-09 MED ORDER — DULOXETINE HCL 30 MG PO CPEP: 30.0000 mg | ORAL_CAPSULE | Freq: Every day | ORAL | 1 refills | Status: DC

## 2021-02-09 NOTE — Therapy (Signed)
[facility] [address] [place], [place], [address] Phone: [phone]   Fax:  [phone]  Occupational Therapy Treatment  Patient Details  Name: Krystal Moore MRN: [id number] Date of Birth: 08-08-1930 Referring Provider (OT): [name], [place]   Encounter Date: 02/08/2021   OT End of Session - 02/08/21 1053    Visit Number 5    Number of Visits 17    Date for OT Re-Evaluation 04/04/21    Authorization Type HTA No auth - VL:MN    Authorization - Visit Number 4    Progress Note Due on Visit 10    OT Start Time 1020    OT Stop Time 1100    OT Time Calculation (min) 40 min    Activity Tolerance Patient tolerated treatment well    Behavior During Therapy [facility] for tasks assessed/performed           Past Medical History:  Diagnosis Date  . Anemia   . Narcolepsy   . Neuropathy   . Obesity   . Pneumonia   . Polyneuropathy in other diseases classified elsewhere ([place])   . Shoulder fracture, left     Past Surgical History:  Procedure Laterality Date  . CATARACT EXTRACTION Left 06/2019  . MOUTH SURGERY    . NASAL SINUS SURGERY      There were no vitals filed for this visit.   Subjective Assessment - 02/09/21 1606    Pertinent History Pt sustained a Comminuted distal radial fracture with extension to the radiocarpal  joint on 11/28/20, pt was treated no-operatively                  Treatment:Fluidotherapy x 10 mins to RUE for pain and stiffness, no adverse reactions. A/ROM tendon gliding, composite finger flexion place and hold, reverse blocking . A/ROM wrist flexion/ extension, Pt was instructed in passive wrist flexion and extension and she performed self ROM.Pt demonstrates moderate edeam in RUE, however it was improved following ROM. Pt demonstrates improved A/ROM at end of session               OT Education - 02/09/21 1604    Education Details Pt was instructed in edema  glove wear, care and precautions, tendon gliding A/ROM-pt perfromed 10 reps, P/ROM prayer postion and wrist flexion/extension.    Person(s) Educated Patient    Methods Explanation;Demonstration;Handout;Verbal cues    Comprehension Verbalized understanding;Returned demonstration;Verbal cues required            OT Short Term Goals - 02/03/21 1510      OT SHORT TERM GOAL #1   Title Patient will demonstrate effective edema management techniques due 03/05/21    Time 4    Period Weeks    Status On-going    Target Date 03/05/21      OT SHORT TERM GOAL #2   Title Patient will complete a home exercise program designed to improve active and passive range of motion in forearm, wrist, and digits    Time 4    Period Weeks    Status On-going      OT SHORT TERM GOAL #3   Title Patient will demonstrate ability to contact pads 2-5  of fingers to palm to help facilitate gross grasp    Time 4    Period Weeks    Status On-going      OT SHORT TERM GOAL #4   Title Patient will demonstrate ability to oppose thumb to 4th and 5th  digit tips with min assist    Time 4    Period Weeks    Status Achieved   mod I            OT Long Term Goals - 02/03/21 1510      OT LONG TERM GOAL #1   Title Patient will complete HEP designed to improve functional strength and use of dominant RUE DUE 6/13    Time 8    Period Weeks    Status On-going      OT LONG TERM GOAL #2   Title Patient will demonstrate 90% composite flexion and extension right hand    Time 8    Period Weeks    Status On-going      OT LONG TERM GOAL #3   Title Patient will demonstrate 10 lb increase in grip strength in right hand to help with functional grip and carry    Time 8    Period Weeks    Status New      OT LONG TERM GOAL #4   Title Patient will demonstrate sufficient stamina and strength/range of motion in right hand 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    OT Occupational Profile and History Problem Focused Assessment - Including review of records relating to presenting problem    Occupational performance deficits (Please refer to evaluation for details): ADL's;IADL's;Work;Leisure    Body Structure / Function / Physical Skills ADL;Coordination;UE functional use;Sensation;Fascial restriction;Decreased knowledge of precautions;Decreased knowledge of use of DME;Flexibility;IADL;Pain;Skin integrity;Strength;FMC;Dexterity;Edema;Mobility;ROM    Rehab Potential Good    Clinical Decision Making Limited treatment options, no task modification necessary    Comorbidities Affecting Occupational Performance: None    Modification or Assistance to Complete Evaluation  No modification of tasks or assist necessary to complete eval    OT Frequency 2x / week    OT Duration 8 weeks    OT Treatment/Interventions Self-care/ADL training;Therapeutic exercise;Patient/family education;Splinting;Compression bandaging;Moist Heat;Paraffin;Fluidtherapy;Therapeutic activities;Cryotherapy;Ultrasound;Contrast Bath;DME and/or AE instruction;Manual Therapy;Passive range of motion    Plan Fluido first than PROM to AAROM  digits, forearm, wrist and HEP,    Consulted and Agree with Plan of Care Patient           Patient will benefit from skilled therapeutic intervention in order to improve the following deficits and impairments:   Body Structure / Function / Physical Skills: ADL,Coordination,UE functional use,Sensation,Fascial restriction,Decreased knowledge of precautions,Decreased knowledge of use of DME,Flexibility,IADL,Pain,Skin  integrity,Strength,FMC,Dexterity,Edema,Mobility,ROM       Visit Diagnosis: Stiffness of right hand, not elsewhere classified  Stiffness of right wrist, not elsewhere classified  Other disturbances of skin sensation  Muscle weakness (generalized)  Localized edema    Problem List Patient Active Problem List   Diagnosis Date Noted  . Numbness 07/30/2019  . Spinal stenosis of lumbar region 07/30/2019  . Osteopenia after menopause 05/29/2019  . Over weight 05/29/2019  . Elevated blood pressure reading 05/29/2019  .  Immunization due 08/03/2017  . Left leg swelling 05/04/2017  . Allergic rhinitis 10/11/2015  . Hypothyroidism 12/10/2013  . Peripheral neuropathy 05/16/2013    Chyane Greer 02/09/2021, 4:09 PM  [facility] [address] [place], [place], [address] Phone: [phone]   Fax:  [phone]  Name: Krystal Moore MRN: [id number] Date of Birth: [birth year]

## 2021-02-09 NOTE — Patient Instructions (Signed)
Neuropathic Pain Neuropathic pain is pain caused by damage to the nerves that are responsible for certain sensations in your body (sensory nerves). The pain can be caused by:  Damage to the sensory nerves that send signals to your spinal cord and brain (peripheral nervous system).  Damage to the sensory nerves in your brain or spinal cord (central nervous system). Neuropathic pain can make you more sensitive to pain. Even a minor sensation can feel very painful. This is usually a long-term condition that can be difficult to treat. The type of pain differs from person to person. It may:  Start suddenly (acute), or it may develop slowly and last for a long time (chronic).  Come and go as damaged nerves heal, or it may stay at the same level for years.  Cause emotional distress, loss of sleep, and a lower quality of life. What are the causes? The most common cause of this condition is diabetes. Many other diseases and conditions can also cause neuropathic pain. Causes of neuropathic pain can be classified as:  Toxic. This is caused by medicines and chemicals. The most common cause of toxic neuropathic pain is damage from cancer treatments (chemotherapy).  Metabolic. This can be caused by: ? Diabetes. This is the most common disease that damages the nerves. ? Lack of vitamin B from long-term alcohol abuse.  Traumatic. Any injury that cuts, crushes, or stretches a nerve can cause damage and pain. A common example is feeling pain after losing an arm or leg (phantom limb pain).  Compression-related. If a sensory nerve gets trapped or compressed for a long period of time, the blood supply to the nerve can be cut off.  Vascular. Many blood vessel diseases can cause neuropathic pain by decreasing blood supply and oxygen to nerves.  Autoimmune. This type of pain results from diseases in which the body's defense system (immune system) mistakenly attacks sensory nerves. Examples of autoimmune diseases  that can cause neuropathic pain include lupus and multiple sclerosis.  Infectious. Many types of viral infections can damage sensory nerves and cause pain. Shingles infection is a common cause of this type of pain.  Inherited. Neuropathic pain can be a symptom of many diseases that are passed down through families (genetic). What increases the risk? You are more likely to develop this condition if:  You have diabetes.  You smoke.  You drink too much alcohol.  You are taking certain medicines, including medicines that kill cancer cells (chemotherapy) or that treat immune system disorders. What are the signs or symptoms? The main symptom is pain. Neuropathic pain is often described as:  Burning.  Shock-like.  Stinging.  Hot or cold.  Itching. How is this diagnosed? No single test can diagnose neuropathic pain. It is diagnosed based on:  Physical exam and your symptoms. Your health care provider will ask you about your pain. You may be asked to use a pain scale to describe how bad your pain is.  Tests. These may be done to see if you have a high sensitivity to pain and to help find the cause and location of any sensory nerve damage. They include: ? Nerve conduction studies to test how well nerve signals travel through your sensory nerves (electrodiagnostic testing). ? Stimulating your sensory nerves through electrodes on your skin and measuring the response in your spinal cord and brain (somatosensory evoked potential).  Imaging studies, such as: ? X-rays. ? CT scan. ? MRI. How is this treated? Treatment for neuropathic pain may change   over time. You may need to try different treatment options or a combination of treatments. Some options include:  Treating the underlying cause of the neuropathy, such as diabetes, kidney disease, or vitamin deficiencies.  Stopping medicines that can cause neuropathy, such as chemotherapy.  Medicine to relieve pain. Medicines may  include: ? Prescription or over-the-counter pain medicine. ? Anti-seizure medicine. ? Antidepressant medicines. ? Pain-relieving patches that are applied to painful areas of skin. ? A medicine to numb the area (local anesthetic), which can be injected as a nerve block.  Transcutaneous nerve stimulation. This uses electrical currents to block painful nerve signals. The treatment is painless.  Alternative treatments, such as: ? Acupuncture. ? Meditation. ? Massage. ? Physical therapy. ? Pain management programs. ? Counseling. Follow these instructions at home: Medicines  Take over-the-counter and prescription medicines only as told by your health care provider.  Do not drive or use heavy machinery while taking prescription pain medicine.  If you are taking prescription pain medicine, take actions to prevent or treat constipation. Your health care provider may recommend that you: ? Drink enough fluid to keep your urine pale yellow. ? Eat foods that are high in fiber, such as fresh fruits and vegetables, whole grains, and beans. ? Limit foods that are high in fat and processed sugars, such as fried or sweet foods. ? Take an over-the-counter or prescription medicine for constipation.   Lifestyle  Have a good support system at home.  Consider joining a chronic pain support group.  Do not use any products that contain nicotine or tobacco, such as cigarettes and e-cigarettes. If you need help quitting, ask your health care provider.  Do not drink alcohol.   General instructions  Learn as much as you can about your condition.  Work closely with all your health care providers to find the treatment plan that works best for you.  Ask your health care provider what activities are safe for you.  Keep all follow-up visits as told by your health care provider. This is important. Contact a health care provider if:  Your pain treatments are not working.  You are having side effects  from your medicines.  You are struggling with tiredness (fatigue), mood changes, depression, or anxiety. Summary  Neuropathic pain is pain caused by damage to the nerves that are responsible for certain sensations in your body (sensory nerves).  Neuropathic pain may come and go as damaged nerves heal, or it may stay at the same level for years.  Neuropathic pain is usually a long-term condition that can be difficult to treat. Consider joining a chronic pain support group. This information is not intended to replace advice given to you by your health care provider. Make sure you discuss any questions you have with your health care provider. Document Revised: 01/30/2019 Document Reviewed: 10/26/2017 Elsevier Patient Education  2021 [employer].

## 2021-02-09 NOTE — Progress Notes (Signed)
Subjective:  Patient ID: Krystal Moore, female    DOB: Dec 20, 1929  Age: 87 y.o. MRN: [id number]  CC: Hypertension   HPI Krystal Moore is a 87 year old female patient of Dr. [name] with a history of hypothyroidism, here for chronic disease management. She is followed by orthopedics due to a right Barton's fracture and is undergoing physical therapy. States she is in danger of loosing her hand and is unable to sleep as she is in pain. Her fingers feel cold and it seems like she has lost circulation; she also endorses presence of burning pain in her right hand.  She is concerned because she placed the [facility]. Endorses compliance with her levothyroxine for hypothyroidism. Her blood pressure is slightly elevated and she attributes this to her pain.  She has not been taking hydrochlorothiazide.  Also not taking Fosamax. Past Medical History:  Diagnosis Date  . Anemia   . Narcolepsy   . Neuropathy   . Obesity   . Pneumonia   . Polyneuropathy in other diseases classified elsewhere ([place])   . Shoulder fracture, left     Past Surgical History:  Procedure Laterality Date  . CATARACT EXTRACTION Left 06/2019  . MOUTH SURGERY    . NASAL SINUS SURGERY      Family History  Problem Relation Age of Onset  . Stroke Mother        questionable  . Clotting disorder Mother   . Heart attack Father     Allergies  Allergen Reactions  . Elemental Sulfur Shortness Of Breath and Swelling  . Milk-Related Compounds Nausea And Vomiting  . Poison Ivy Extract [Poison Ivy Extract] Itching, Rash and Other (See Comments)    Muscle damage   . Tobacco [Tobacco] Anaphylaxis and Swelling    Allergic to cigarette smoke  . Orange Juice [Orange Oil] Nausea And Vomiting    Outpatient Medications Prior to Visit  Medication Sig Dispense Refill  . betamethasone dipropionate 0.05 % lotion Apply 1 application topically 2 (two) times a week.    . bisacodyl (DULCOLAX) 10 MG suppository Place  1 suppository (10 mg total) rectally as needed for moderate constipation. 12 suppository 0  . Cyanocobalamin 5000 MCG CAPS Take 5,000 mcg by mouth daily. Reported on 10/11/2015 30 capsule 11  . levothyroxine (SYNTHROID) 100 MCG tablet TAKE 1 TABLET(100 MCG) BY MOUTH DAILY BEFORE AND BREAKFAST 30 tablet 0  . Multiple Vitamins-Minerals (MULTIVITAMIN PO) Take 1 tablet by mouth daily.     . potassium chloride SA (KLOR-CON) 20 MEQ tablet Take 1 tablet (20 mEq total) by mouth daily. 5 tablet 0  . alendronate (FOSAMAX) 35 MG tablet TAKE 1 TABLET BY MOUTH EVERY 7 DAYS, WITH WATER ON AN EMPTY STOMACH, DO NOT EAT OR TAKE OTHER MEDICATION FOR AT LEAST 1 HOUR (Patient not taking: Reported on 02/09/2021) 4 tablet 6  . aspirin 81 MG tablet Take 81 mg by mouth daily. (Patient not taking: No sig reported)    . hydrochlorothiazide (HYDRODIURIL) 12.5 MG tablet TAKE 1 TABLET BY MOUTH DAILY AS NEEDED FOR LOWER EXTREMITY SWELLING (Patient not taking: Reported on 02/09/2021) 30 tablet 0  . HYDROcodone-acetaminophen (NORCO) 5-325 MG tablet Take 1 tablet by mouth 3 (three) times daily as needed. 30 tablet 0  . LUTEIN PO Take 1 tablet by mouth daily. (Patient not taking: No sig reported)    . methocarbamol (ROBAXIN) 500 MG tablet Take 1 tablet (500 mg total) by mouth every 8 (eight) hours as needed for muscle spasms. (Patient  not taking: No sig reported) 60 tablet 0  . ondansetron (ZOFRAN ODT) 4 MG disintegrating tablet Take 1 tablet (4 mg total) by mouth every 8 (eight) hours as needed for nausea or vomiting. (Patient not taking: No sig reported) 20 tablet 0  . OVER THE COUNTER MEDICATION Take 1 tablet by mouth daily as needed (cold symtpoms). Reported on 10/11/2015 (Patient not taking: Reported on 02/09/2021)    . polyethylene glycol (MIRALAX) 17 g packet Take 17 g by mouth daily. (Patient not taking: No sig reported) 28 each 0  . sodium chloride (OCEAN) 0.65 % SOLN nasal spray Place 1 spray into both nostrils as needed for  congestion. Reported on 10/11/2015 (Patient not taking: No sig reported)     No facility-administered medications prior to visit.     ROS Review of Systems  Constitutional: Negative for activity change, appetite change and fatigue.  HENT: Negative for congestion, sinus pressure and sore throat.   Eyes: Negative for visual disturbance.  Respiratory: Negative for cough, chest tightness, shortness of breath and wheezing.   Cardiovascular: Negative for chest pain and palpitations.  Gastrointestinal: Negative for abdominal distention, abdominal pain and constipation.  Endocrine: Negative for polydipsia.  Genitourinary: Negative for dysuria and frequency.  Musculoskeletal: Negative for arthralgias and back pain.       See HPI  Skin: Negative for rash.  Neurological: Negative for tremors, light-headedness and numbness.  Hematological: Does not bruise/bleed easily.  Psychiatric/Behavioral: Negative for agitation and behavioral problems.    Objective:  BP (!) 143/80   Pulse 83   Resp 17   Ht 5\' 4"  (1.626 m)   Wt 163 lb 3.2 oz (74 kg)   SpO2 96%   BMI 28.01 kg/m   BP/Weight 02/09/2021 1/93/7902 4/0/9735  Systolic BP 329 924 268  Diastolic BP 80 72 67  Wt. (Lbs) 163.2 - -  BMI 28.01 - -      Physical Exam Constitutional:      Appearance: She is well-developed.  Neck:     Vascular: No JVD.  Cardiovascular:     Rate and Rhythm: Normal rate.     Heart sounds: Normal heart sounds. No murmur heard.   Pulmonary:     Effort: Pulmonary effort is normal.     Breath sounds: Normal breath sounds. No wheezing or rales.  Chest:     Chest wall: No tenderness.  Abdominal:     General: Bowel sounds are normal. There is no distension.     Palpations: Abdomen is soft. There is no mass.     Tenderness: There is no abdominal tenderness.  Musculoskeletal:        General: Normal range of motion.     Right lower leg: No edema.     Left lower leg: No edema.     Comments: Splint and  right index finger.  Edema of medial 3 fingers right hand.  Limited ability to make a fist and right hand. Left hand is normal  Neurological:     Mental Status: She is alert and oriented to person, place, and time.  Psychiatric:        Mood and Affect: Mood normal.     CMP Latest Ref Rng & Units 03/09/2020 05/29/2019 04/10/2018  Glucose 70 - 99 mg/dL 104(H) 97 90  BUN 8 - 23 mg/dL 12 12 16   Creatinine 0.44 - 1.00 mg/dL 0.89 0.91 0.94  Sodium 135 - 145 mmol/L 133(L) 138 140  Potassium 3.5 - 5.1 mmol/L 3.3(L) 4.4  4.5  Chloride 98 - 111 mmol/L 100 102 104  CO2 22 - 32 mmol/L 24 22 21   Calcium 8.9 - 10.3 mg/dL 9.7 9.4 9.5  Total Protein 6.5 - 8.1 g/dL 6.1(L) 6.3 6.1  Total Bilirubin 0.3 - 1.2 mg/dL 0.7 0.5 0.3  Alkaline Phos 38 - 126 U/L 42 66 59  AST 15 - 41 U/L 25 21 20   ALT 0 - 44 U/L 18 19 17     Lipid Panel     Component Value Date/Time   CHOL 233 (H) 05/29/2019 1127   TRIG 55 05/29/2019 1127   HDL 90 05/29/2019 1127   CHOLHDL 2.6 05/29/2019 1127   CHOLHDL 3.1 06/08/2016 1155   VLDL 20 06/08/2016 1155   LDLCALC 132 (H) 05/29/2019 1127    CBC    Component Value Date/Time   WBC 4.1 03/09/2020 1342   RBC 4.17 03/09/2020 1342   HGB 12.8 03/09/2020 1342   HGB 14.0 05/29/2019 1127   HGB 13.5 06/07/2015 1240   HCT 37.6 03/09/2020 1342   HCT 41.0 05/29/2019 1127   HCT 41.0 06/07/2015 1240   PLT 241 03/09/2020 1342   PLT 238 05/29/2019 1127   MCV 90.2 03/09/2020 1342   MCV 92 05/29/2019 1127   MCV 91.9 06/07/2015 1240   MCH 30.7 03/09/2020 1342   MCHC 34.0 03/09/2020 1342   RDW 12.9 03/09/2020 1342   RDW 12.2 05/29/2019 1127   RDW 13.6 06/07/2015 1240   LYMPHSABS 1.3 03/09/2020 1342   LYMPHSABS 1.5 06/07/2015 1240   MONOABS 0.3 03/09/2020 1342   MONOABS 0.4 06/07/2015 1240   EOSABS 0.2 03/09/2020 1342   EOSABS 0.2 06/07/2015 1240   BASOSABS 0.1 03/09/2020 1342   BASOSABS 0.1 06/07/2015 1240    Lab Results  Component Value Date   HGBA1C 5.3 05/29/2019     Assessment & Plan:  1. Hypothyroidism, unspecified type Will send of thyroid panel and adjust regimen accordingly - T4, free - TSH - Basic Metabolic Panel  2. Neuropathic pain Secondary to R Barton's fracture Currently wearing splint She has been using Tylenol for pain Given presence of neuropathic pain advised that we could try low-dose Cymbalta. Continue PT and follow-up with orthopedics - DULoxetine (CYMBALTA) 30 MG capsule; Take 1 capsule (30 mg total) by mouth daily. For neuropathic pain  Dispense: 30 capsule; Refill: 1    Meds ordered this encounter  Medications  . DULoxetine (CYMBALTA) 30 MG capsule    Sig: Take 1 capsule (30 mg total) by mouth daily. For neuropathic pain    Dispense:  30 capsule    Refill:  1    Follow-up: Return in about 3 months (around 05/11/2021) for PCP Dr. [name] for chronic disease management.       Charlott Rakes, MD, FAAFP. [facility] [place], [place]   02/09/2021, 5:01 PM

## 2021-02-10 ENCOUNTER — Other Ambulatory Visit: Payer: Self-pay

## 2021-02-10 ENCOUNTER — Other Ambulatory Visit: Payer: Self-pay | Admitting: Family Medicine

## 2021-02-10 ENCOUNTER — Ambulatory Visit: Payer: PPO | Admitting: Occupational Therapy

## 2021-02-10 DIAGNOSIS — R600 Localized edema: Secondary | ICD-10-CM

## 2021-02-10 DIAGNOSIS — E039 Hypothyroidism, unspecified: Secondary | ICD-10-CM

## 2021-02-10 DIAGNOSIS — R208 Other disturbances of skin sensation: Secondary | ICD-10-CM

## 2021-02-10 DIAGNOSIS — M25641 Stiffness of right hand, not elsewhere classified: Secondary | ICD-10-CM

## 2021-02-10 DIAGNOSIS — M25631 Stiffness of right wrist, not elsewhere classified: Secondary | ICD-10-CM

## 2021-02-10 DIAGNOSIS — M6281 Muscle weakness (generalized): Secondary | ICD-10-CM

## 2021-02-10 LAB — BASIC METABOLIC PANEL WITH GFR
BUN/Creatinine Ratio: 10 — ABNORMAL LOW (ref 12–28)
BUN: 9 mg/dL (ref 8–27)
CO2: 25 mmol/L (ref 20–29)
Calcium: 10.1 mg/dL (ref 8.7–10.3)
Chloride: 92 mmol/L — ABNORMAL LOW (ref 96–106)
Creatinine, Ser: 0.88 mg/dL (ref 0.57–1.00)
Glucose: 97 mg/dL (ref 65–99)
Potassium: 3.8 mmol/L (ref 3.5–5.2)
Sodium: 135 mmol/L (ref 134–144)
eGFR: 63 mL/min/1.73 (ref >59)

## 2021-02-10 LAB — TSH: TSH: 2.45 u[IU]/mL (ref 0.450–4.500)

## 2021-02-10 LAB — T4, FREE: Free T4: 1.58 ng/dL (ref 0.82–1.77)

## 2021-02-10 MED ORDER — LEVOTHYROXINE SODIUM 100 MCG PO TABS
ORAL_TABLET | ORAL | 6 refills | Status: DC
Start: 2021-02-10 — End: 2021-08-05

## 2021-02-10 NOTE — Therapy (Signed)
[facility] [address] [place], [place], [address] Phone: [phone]   Fax:  [phone]  Occupational Therapy Treatment  Patient Details  Name: Krystal Moore MRN: [id number] Date of Birth: 1930-09-27 Referring Provider (OT): [name], [place]   Encounter Date: 02/10/2021   OT End of Session - 02/10/21 1526    Visit Number 6    Number of Visits 17    Date for OT Re-Evaluation 04/04/21    Authorization Type HTA No auth - VL:MN    Authorization - Visit Number 6   corrected count, eval was not originally counted   Progress Note Due on Visit 10    OT Start Time 1450    OT Stop Time 1530    OT Time Calculation (min) 40 min    Activity Tolerance Patient tolerated treatment well    Behavior During Therapy [facility] for tasks assessed/performed           Past Medical History:  Diagnosis Date  . Anemia   . Narcolepsy   . Neuropathy   . Obesity   . Pneumonia   . Polyneuropathy in other diseases classified elsewhere ([place])   . Shoulder fracture, left     Past Surgical History:  Procedure Laterality Date  . CATARACT EXTRACTION Left 06/2019  . MOUTH SURGERY    . NASAL SINUS SURGERY      There were no vitals filed for this visit.   Subjective Assessment - 02/11/21 0957    Pertinent History Pt sustained a Comminuted distal radial fracture with extension to the radiocarpal  joint on 11/28/20, pt was treated no-operatively    Currently in Pain? Yes    Pain Score 5     Pain Location Finger (Comment which one)    Pain Orientation Right    Pain Descriptors / Indicators Aching    Pain Type Neuropathic pain    Pain Frequency Intermittent    Aggravating Factors  movement    Pain Relieving Factors heat                    Treatment:Fluidotherapy x 12 mins to RUE for pain and stiffness, no adverse reactions.  A/ROM wrist flexion/ extension and then pt performed prayer position and self passive wrist  flexion. Joint /soft tissue mobs to MP's and hand. Pt was instructed in yellow putty HEP. See pt instructions Pt demonstrates improved A/ROM at end of session             OT Education - 02/10/21 1522    Education Details yellow putty for composite grip and pinch    Person(s) Educated Patient    Methods Explanation;Demonstration;Handout;Verbal cues    Comprehension Verbalized understanding;Returned demonstration;Verbal cues required            OT Short Term Goals - 02/03/21 1510      OT SHORT TERM GOAL #1   Title Patient will demonstrate effective edema management techniques due 03/05/21    Time 4    Period Weeks    Status On-going    Target Date 03/05/21      OT SHORT TERM GOAL #2   Title Patient will complete a home exercise program designed to improve active and passive range of motion in forearm, wrist, and digits    Time 4    Period Weeks    Status On-going      OT SHORT TERM GOAL #3   Title Patient will demonstrate ability to contact pads  2-5  of fingers to palm to help facilitate gross grasp    Time 4    Period Weeks    Status On-going      OT SHORT TERM GOAL #4   Title Patient will demonstrate ability to oppose thumb to 4th and 5th digit tips with min assist    Time 4    Period Weeks    Status Achieved   mod I            OT Long Term Goals - 02/03/21 1510      OT LONG TERM GOAL #1   Title Patient will complete HEP designed to improve functional strength and use of dominant RUE DUE 6/13    Time 8    Period Weeks    Status On-going      OT LONG TERM GOAL #2   Title Patient will demonstrate 90% composite flexion and extension right hand    Time 8    Period Weeks    Status On-going      OT LONG TERM GOAL #3   Title Patient will demonstrate 10 lb increase in grip strength in right hand to help with functional grip and carry    Time 8    Period Weeks    Status New      OT LONG TERM GOAL #4   Title Patient will demonstrate sufficient  stamina and strength/range of motion in right hand 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 She demonstrates improving ROM.    OT Occupational Profile and History Problem Focused Assessment - Including review of records relating to presenting problem    Occupational performance deficits (Please refer to evaluation for details): ADL's;IADL's;Work;Leisure    Body Structure / Function / Physical Skills ADL;Coordination;UE functional use;Sensation;Fascial restriction;Decreased knowledge of precautions;Decreased knowledge of use of DME;Flexibility;IADL;Pain;Skin integrity;Strength;FMC;Dexterity;Edema;Mobility;ROM    Rehab Potential Good    Clinical Decision Making Limited treatment options, no task modification necessary    Comorbidities Affecting Occupational Performance: None    Modification or Assistance to Complete Evaluation  No modification of tasks or assist necessary to complete eval    OT Frequency 2x / week    OT Duration 8 weeks    OT Treatment/Interventions Self-care/ADL training;Therapeutic exercise;Patient/family education;Splinting;Compression bandaging;Moist Heat;Paraffin;Fluidtherapy;Therapeutic activities;Cryotherapy;Ultrasound;Contrast Bath;DME and/or AE instruction;Manual Therapy;Passive range of motion    Plan Fluido gentle A/ROM, P/ROM and putty    Consulted and Agree with Plan of Care Patient           Patient will benefit from skilled therapeutic intervention in order to improve the following deficits and impairments:   Body Structure / Function / Physical Skills: ADL,Coordination,UE functional  use,Sensation,Fascial restriction,Decreased knowledge of precautions,Decreased knowledge of use of DME,Flexibility,IADL,Pain,Skin integrity,Strength,FMC,Dexterity,Edema,Mobility,ROM       Visit Diagnosis: Stiffness of right hand, not elsewhere classified  Stiffness of right wrist, not elsewhere classified  Other disturbances of skin sensation  Muscle weakness (generalized)  Localized edema  Problem List Patient Active Problem List   Diagnosis Date Noted  . Numbness 07/30/2019  . Spinal stenosis of lumbar region 07/30/2019  . Osteopenia after menopause 05/29/2019  . Over weight 05/29/2019  . Elevated blood pressure reading 05/29/2019  . Immunization due 08/03/2017  . Left leg swelling 05/04/2017  . Allergic rhinitis 10/11/2015  . Hypothyroidism 12/10/2013  . Peripheral neuropathy 05/16/2013    Ryana Montecalvo 02/11/2021, 10:00 AM Theone Murdoch, OTR/L [phone] Phone: [phone] 10:03 AM 02/11/21 [facility] [address] [place], [place], [address] Phone: [phone]   Fax:  [phone]  Name: Mykah Bellomo MRN: [id number] Date of Birth: [birth year]

## 2021-02-10 NOTE — Patient Instructions (Signed)
1. Grip Strengthening (Resistive Putty)   Squeeze putty using thumb and all fingers. Repeat 10-20__ times. Do __1__ sessions per day.   2. Roll putty into tube on table and pinch between each finger and thumb x 10 reps each. (can do ring and small finger together)     Copyright  VHI. All rights reserved.

## 2021-02-14 ENCOUNTER — Ambulatory Visit: Payer: PPO | Admitting: Internal Medicine

## 2021-02-15 ENCOUNTER — Other Ambulatory Visit: Payer: Self-pay

## 2021-02-15 ENCOUNTER — Ambulatory Visit: Payer: PPO | Admitting: Occupational Therapy

## 2021-02-15 DIAGNOSIS — M25641 Stiffness of right hand, not elsewhere classified: Secondary | ICD-10-CM

## 2021-02-15 DIAGNOSIS — R600 Localized edema: Secondary | ICD-10-CM | POA: Diagnosis not present

## 2021-02-15 DIAGNOSIS — M25631 Stiffness of right wrist, not elsewhere classified: Secondary | ICD-10-CM

## 2021-02-15 DIAGNOSIS — R208 Other disturbances of skin sensation: Secondary | ICD-10-CM

## 2021-02-15 DIAGNOSIS — M6281 Muscle weakness (generalized): Secondary | ICD-10-CM

## 2021-02-15 NOTE — Therapy (Signed)
[facility] [address] [place], [place], [address] Phone: [phone]   Fax:  [phone]  Occupational Therapy Treatment  Patient Details  Name: Krystal Moore MRN: [id number] Date of Birth: [birth year] Referring Provider (OT): [name], [place]   Encounter Date: 02/15/2021   OT End of Session - 02/15/21 1332    Visit Number 7    Number of Visits 17    Date for OT Re-Evaluation 04/04/21    Authorization Type HTA No auth - VL:MN    Authorization - Visit Number 7    Progress Note Due on Visit 10    OT Start Time 1320    OT Stop Time 1400    OT Time Calculation (min) 40 min           Past Medical History:  Diagnosis Date  . Anemia   . Narcolepsy   . Neuropathy   . Obesity   . Pneumonia   . Polyneuropathy in other diseases classified elsewhere ([place])   . Shoulder fracture, left     Past Surgical History:  Procedure Laterality Date  . CATARACT EXTRACTION Left 06/2019  . MOUTH SURGERY    . NASAL SINUS SURGERY      There were no vitals filed for this visit.   Subjective Assessment - 02/15/21 1331    Subjective  Pt reports continued significant pain and swelling    Pertinent History Pt sustained a Comminuted distal radial fracture with extension to the radiocarpal  joint on 11/28/20, pt was treated no-operatively    Pain Score 7     Pain Location Hand    Pain Orientation Right    Pain Descriptors / Indicators Aching    Pain Onset More than a month ago    Pain Frequency Constant    Aggravating Factors  movement    Pain Relieving Factors heat                     Treatment:Fluidotherapy x 12 mins to RUE for pain and stiffness, no adverse reactions.  Composite finger flexion/ extension, MP flexion / extension, A/ROM wrist flexion/ extension , ulnar radial deviation.  Therapist recommends pt does not perform passive ROM to wrist at this time due to significant pain. Gripping light  resistance gripper with rubberbands x 20 reps Crumpling washcloth with RUE, then flattening out for finger flexion/ extension, min vc. Pt reports she had significant pain this weekend and saw PCP who prescribed gabapentin. Therapist encouraged pt to f/u with orthopedics.          OT Short Term Goals - 02/03/21 1510      OT SHORT TERM GOAL #1   Title Patient will demonstrate effective edema management techniques due 03/05/21    Time 4    Period Weeks    Status On-going    Target Date 03/05/21      OT SHORT TERM GOAL #2   Title Patient will complete a home exercise program designed to improve active and passive range of motion in forearm, wrist, and digits    Time 4    Period Weeks    Status On-going      OT SHORT TERM GOAL #3   Title Patient will demonstrate ability to contact pads 2-5  of fingers to palm to help facilitate gross grasp    Time 4    Period Weeks    Status On-going      OT SHORT TERM GOAL #4  Title Patient will demonstrate ability to oppose thumb to 4th and 5th digit tips with min assist    Time 4    Period Weeks    Status Achieved   mod I            OT Long Term Goals - 02/03/21 1510      OT LONG TERM GOAL #1   Title Patient will complete HEP designed to improve functional strength and use of dominant RUE DUE 6/13    Time 8    Period Weeks    Status On-going      OT LONG TERM GOAL #2   Title Patient will demonstrate 90% composite flexion and extension right hand    Time 8    Period Weeks    Status On-going      OT LONG TERM GOAL #3   Title Patient will demonstrate 10 lb increase in grip strength in right hand to help with functional grip and carry    Time 8    Period Weeks    Status New      OT LONG TERM GOAL #4   Title Patient will demonstrate sufficient stamina and strength/range of motion in right hand 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    Time 8    Period Weeks    Status On-going      OT LONG TERM GOAL #5    Title Patient will demonstrate at least 5 lb increase in pinch strength in right hand to aide with opening packages, holding small items    Time 8    Period Weeks    Status New                  Patient will benefit from skilled therapeutic intervention in order to improve the following deficits and impairments:           Visit Diagnosis: Stiffness of right hand, not elsewhere classified  Stiffness of right wrist, not elsewhere classified  Other disturbances of skin sensation  Muscle weakness (generalized)  Localized edema    Problem List Patient Active Problem List   Diagnosis Date Noted  . Numbness 07/30/2019  . Spinal stenosis of lumbar region 07/30/2019  . Osteopenia after menopause 05/29/2019  . Over weight 05/29/2019  . Elevated blood pressure reading 05/29/2019  . Immunization due 08/03/2017  . Left leg swelling 05/04/2017  . Allergic rhinitis 10/11/2015  . Hypothyroidism 12/10/2013  . Peripheral neuropathy 05/16/2013    Krystal Moore 02/15/2021, 1:32 PM  [facility] [address] [place], [place], [address] Phone: [phone]   Fax:  [phone]  Name: Krystal Moore MRN: [id number] Date of Birth: 17-Oct-1930

## 2021-02-16 ENCOUNTER — Encounter: Payer: Self-pay | Admitting: Occupational Therapy

## 2021-02-17 ENCOUNTER — Ambulatory Visit: Payer: PPO | Admitting: Occupational Therapy

## 2021-02-17 ENCOUNTER — Other Ambulatory Visit: Payer: Self-pay

## 2021-02-17 DIAGNOSIS — M6281 Muscle weakness (generalized): Secondary | ICD-10-CM

## 2021-02-17 DIAGNOSIS — M25641 Stiffness of right hand, not elsewhere classified: Secondary | ICD-10-CM

## 2021-02-17 DIAGNOSIS — R208 Other disturbances of skin sensation: Secondary | ICD-10-CM

## 2021-02-17 DIAGNOSIS — M25631 Stiffness of right wrist, not elsewhere classified: Secondary | ICD-10-CM

## 2021-02-17 DIAGNOSIS — R600 Localized edema: Secondary | ICD-10-CM | POA: Diagnosis not present

## 2021-02-17 NOTE — Therapy (Signed)
[facility] [address] [place], [place], [address] Phone: [phone]   Fax:  [phone]  Occupational Therapy Treatment  Patient Details  Name: Krystal Moore MRN: [id number] Date of Birth: 03-Jun-1930 Referring Provider (OT): [name], [place]   Encounter Date: 02/17/2021   OT End of Session - 02/17/21 1056    Visit Number 8    Number of Visits 17    Date for OT Re-Evaluation 04/04/21    Authorization Type HTA No Josem Kaufmann - VL:MN    Authorization - Visit Number 7    Progress Note Due on Visit 10    OT Start Time 1025    OT Stop Time 1100    OT Time Calculation (min) 35 min           Past Medical History:  Diagnosis Date  . Anemia   . Narcolepsy   . Neuropathy   . Obesity   . Pneumonia   . Polyneuropathy in other diseases classified elsewhere ([place])   . Shoulder fracture, left     Past Surgical History:  Procedure Laterality Date  . CATARACT EXTRACTION Left 06/2019  . MOUTH SURGERY    . NASAL SINUS SURGERY      There were no vitals filed for this visit.   Subjective Assessment - 02/17/21 1029    Subjective  Pt reports continues pain, she has started taking pain medicine    Pertinent History Pt sustained a Comminuted distal radial fracture with extension to the radiocarpal  joint on 11/28/20, pt was treated no-operatively    Currently in Pain? Yes    Pain Score 4     Pain Location Hand    Pain Orientation Right    Pain Descriptors / Indicators Aching    Pain Type Surgical pain    Pain Onset More than a month ago    Pain Frequency Constant    Aggravating Factors  movement    Pain Relieving Factors heat                   Treatment: Fluidotherapy to RUE x 11 mins, no adverse reactions. A/ROM/ P/ROM composite finger flexion extension, A/ROM finger thumb opposition Wrist flexion/ extension A/ROM, followed by pt performing self ROM only within tolerated ROM Light gripper with  rubberbands x 20 reps for composite finger flexion Yellow putty exercises for grip and  Tip pinch min v.c Therapist fabricated key grip for pt car keys as she reports difficulty turning key            OT Education - 02/17/21 1058    Education Details Recommendation that pt calls [facility] for f/u visit, phone number provided, therapist will send MD a messsage onece pt schedules appointment.    Person(s) Educated Patient    Methods Explanation;Demonstration;Handout;Verbal cues    Comprehension Verbalized understanding;Returned demonstration;Verbal cues required            OT Short Term Goals - 02/17/21 1301      OT SHORT TERM GOAL #1   Title Patient will demonstrate effective edema management techniques due 03/05/21    Time 4    Period Weeks    Status On-going   edema glove issued however pt using inconsistently   Target Date 03/05/21      OT SHORT TERM GOAL #2   Title Patient will complete a home exercise program designed to improve active and passive range of motion in forearm, wrist, and digits    Time 4  Period Weeks    Status Achieved      OT SHORT TERM GOAL #3   Title Patient will demonstrate ability to contact pads 2-5  of fingers to palm to help facilitate gross grasp    Time 4    Period Weeks    Status On-going      OT SHORT TERM GOAL #4   Title Patient will demonstrate ability to oppose thumb to 4th and 5th digit tips with min assist    Time 4    Period Weeks    Status Achieved   mod I            OT Long Term Goals - 02/03/21 1510      OT LONG TERM GOAL #1   Title Patient will complete HEP designed to improve functional strength and use of dominant RUE DUE 6/13    Time 8    Period Weeks    Status On-going      OT LONG TERM GOAL #2   Title Patient will demonstrate 90% composite flexion and extension right hand    Time 8    Period Weeks    Status On-going      OT LONG TERM GOAL #3   Title Patient will demonstrate 10 lb increase in grip strength  in right hand to help with functional grip and carry    Time 8    Period Weeks    Status New      OT LONG TERM GOAL #4   Title Patient will demonstrate sufficient stamina and strength/range of motion in right hand 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 Therapist has recommended hat pt contacts MD for follow up appointment.    OT Occupational Profile and History Problem Focused Assessment - Including review of records relating to presenting problem    Occupational performance deficits (Please refer to evaluation for details): ADL's;IADL's;Work;Leisure    Body Structure / Function / Physical Skills ADL;Coordination;UE functional use;Sensation;Fascial restriction;Decreased knowledge of precautions;Decreased knowledge of use of DME;Flexibility;IADL;Pain;Skin integrity;Strength;FMC;Dexterity;Edema;Mobility;ROM    OT Frequency 2x / week    OT Duration 8 weeks    OT Treatment/Interventions Self-care/ADL training;Therapeutic exercise;Patient/family education;Splinting;Compression bandaging;Moist Heat;Paraffin;Fluidtherapy;Therapeutic activities;Cryotherapy;Ultrasound;Contrast Bath;DME and/or AE instruction;Manual Therapy;Passive range of motion    Plan continue fluidotherapy, putty, A/ROM and P/ROM to hand, pt is perfroming A/ROM and self P/ROM to wrist within tolerated ROM, send note to MD if pt schedules appt.    Consulted and Agree with Plan of Care Patient           Patient will benefit from skilled therapeutic  intervention in order to improve the following deficits and impairments:   Body Structure / Function / Physical Skills: ADL,Coordination,UE functional use,Sensation,Fascial restriction,Decreased knowledge of precautions,Decreased knowledge of use of DME,Flexibility,IADL,Pain,Skin integrity,Strength,FMC,Dexterity,Edema,Mobility,ROM       Visit Diagnosis: Stiffness of right  hand, not elsewhere classified  Stiffness of right wrist, not elsewhere classified  Other disturbances of skin sensation  Muscle weakness (generalized)  Localized edema    Problem List Patient Active Problem List   Diagnosis Date Noted  . Numbness 07/30/2019  . Spinal stenosis of lumbar region 07/30/2019  . Osteopenia after menopause 05/29/2019  . Over weight 05/29/2019  . Elevated blood pressure reading 05/29/2019  . Immunization due 08/03/2017  . Left leg swelling 05/04/2017  . Allergic rhinitis 10/11/2015  . Hypothyroidism 12/10/2013  . Peripheral neuropathy 05/16/2013    Edgel Degnan 02/17/2021, 1:02 PM Theone Murdoch, OTR/L [phone] Phone: [phone] 1:06 PM 02/17/21 [facility] [address] [place], [place], [address] Phone: [phone]   Fax:  [phone]  Name: Krystal Moore MRN: [id number] Date of Birth: [birth year]

## 2021-02-17 NOTE — Patient Instructions (Signed)
Please call Dr. Phoebe Sharps office for a follow up visit, [phone].  OT can send him a note regarding your progress once visit is scheduled.

## 2021-02-22 ENCOUNTER — Other Ambulatory Visit: Payer: Self-pay

## 2021-02-22 ENCOUNTER — Encounter: Payer: Self-pay | Admitting: Occupational Therapy

## 2021-02-22 ENCOUNTER — Ambulatory Visit: Payer: PPO | Attending: Physician Assistant | Admitting: Occupational Therapy

## 2021-02-22 DIAGNOSIS — R208 Other disturbances of skin sensation: Secondary | ICD-10-CM | POA: Diagnosis not present

## 2021-02-22 DIAGNOSIS — M6281 Muscle weakness (generalized): Secondary | ICD-10-CM | POA: Diagnosis not present

## 2021-02-22 DIAGNOSIS — M25631 Stiffness of right wrist, not elsewhere classified: Secondary | ICD-10-CM

## 2021-02-22 DIAGNOSIS — R600 Localized edema: Secondary | ICD-10-CM | POA: Diagnosis not present

## 2021-02-22 DIAGNOSIS — M25641 Stiffness of right hand, not elsewhere classified: Secondary | ICD-10-CM | POA: Diagnosis not present

## 2021-02-22 NOTE — Therapy (Signed)
[facility] [address] [place], [place], [address] Phone: [phone]   Fax:  [phone]  Occupational Therapy Treatment  Patient Details  Name: Krystal Moore MRN: [id number] Date of Birth: 10-Mar-1930 Referring Provider (OT): [name], [place]   Encounter Date: 02/22/2021   OT End of Session - 02/22/21 1640    Visit Number 9    Number of Visits 17    Date for OT Re-Evaluation 04/04/21    Authorization Type HTA No auth - VL:MN    Authorization - Visit Number 8    Progress Note Due on Visit 10    OT Start Time 2595    OT Stop Time 1620    OT Time Calculation (min) 45 min    Equipment Utilized During Treatment fluidotherapy    Activity Tolerance Patient tolerated treatment well    Behavior During Therapy [facility] for tasks assessed/performed           Past Medical History:  Diagnosis Date  . Anemia   . Narcolepsy   . Neuropathy   . Obesity   . Pneumonia   . Polyneuropathy in other diseases classified elsewhere ([place])   . Shoulder fracture, left     Past Surgical History:  Procedure Laterality Date  . CATARACT EXTRACTION Left 06/2019  . MOUTH SURGERY    . NASAL SINUS SURGERY      There were no vitals filed for this visit.   Subjective Assessment - 02/22/21 1631    Subjective  Patient reports constant burning in right hand - feels strength is improving but still having stiffness in hands.  The new medication seems to be taking the edge off the pain    Pertinent History Pt sustained a Comminuted distal radial fracture with extension to the radiocarpal  joint on 11/28/20, pt was treated no-operatively    Currently in Pain? No/denies    Pain Score 0-No pain              [facility] OT Assessment - 02/22/21 0001      ROM / Strength   AROM / PROM / Strength AROM      AROM   Overall AROM  Deficits    Overall AROM Comments 30 wrist extension/40 wrist flex      Right Hand AROM   R Index  MCP  0-90 60 Degrees    R Index PIP 0-100 90 Degrees    R Long  MCP 0-90 80 Degrees    R Long PIP 0-100 90 Degrees    R Ring  MCP 0-90 75 Degrees    R Ring PIP 0-100 85 Degrees    R Little  MCP 0-90 75 Degrees    R Little PIP 0-100 80 Degrees      Hand Function   Right Hand Grip (lbs) 11.90    Right Hand Lateral Pinch 8 lbs                    OT Treatments/Exercises (OP) - 02/22/21 0001      ADLs   ADL Comments Patient with scratches on left upper arm and forearm from fall [place].  Patient has reported three falls - discussed if patient would consider PT evaluation - patient feels these are isolated incidences, and is not interested in pursuing balance treatment at this time.  Patient is scheduled to return to ortho MD next week.      [place]   Number Minutes Fluidotherapy  12 Minutes    RUE Fluidotherapy Location Hand;Wrist;Forearm    Comments Prior to manual therapy      Manual Therapy   Manual Therapy Soft tissue mobilization;Joint mobilization    Joint Mobilization Gentle thumb rotation to improve opposition/ radial abduction    Soft tissue mobilization thumb web space to increase range of motion of thumb away from palm of hand.                  OT Education - 02/22/21 1639    Education Details Reviewed progress - improved range of motion digits, and wrist, improved edema - although persists, improving strength changes have been slow, but consistent    Person(s) Educated Patient    Methods Explanation;Demonstration    Comprehension Verbalized understanding            OT Short Term Goals - 02/22/21 1641      OT SHORT TERM GOAL #1   Title Patient will demonstrate effective edema management techniques due 03/05/21    Time 4    Period Weeks    Status On-going   edema glove issued however pt using inconsistently   Target Date 03/05/21      OT SHORT TERM GOAL #2   Title Patient will complete a home exercise program designed to improve  active and passive range of motion in forearm, wrist, and digits    Time 4    Period Weeks    Status Achieved      OT SHORT TERM GOAL #3   Title Patient will demonstrate ability to contact pads 2-5  of fingers to palm to help facilitate gross grasp    Time 4    Period Weeks    Status On-going      OT SHORT TERM GOAL #4   Title Patient will demonstrate ability to oppose thumb to 4th and 5th digit tips with min assist    Time 4    Period Weeks    Status Achieved   mod I            OT Long Term Goals - 02/22/21 1646      OT LONG TERM GOAL #1   Title Patient will complete HEP designed to improve functional strength and use of dominant RUE DUE 6/13    Time 8    Period Weeks    Status On-going      OT LONG TERM GOAL #2   Title Patient will demonstrate 90% composite flexion and extension right hand    Time 8    Period Weeks    Status On-going      OT LONG TERM GOAL #3   Title Patient will demonstrate 10 lb increase in grip strength in right hand to help with functional grip and carry    Time 8    Period Weeks    Status New      OT LONG TERM GOAL #4   Title Patient will demonstrate sufficient stamina and strength/range of motion in right hand 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 Therapist has recommended hat pt contacts MD for follow up appointment.   Patient will see MD next week.    OT Occupational Profile and History Problem Focused Assessment - Including review of records relating to  presenting problem    Occupational performance deficits (Please refer to evaluation for details): ADL's;IADL's;Work;Leisure    Body Structure / Function / Physical Skills ADL;Coordination;UE functional use;Sensation;Fascial restriction;Decreased knowledge of precautions;Decreased knowledge of use of DME;Flexibility;IADL;Pain;Skin integrity;Strength;FMC;Dexterity;Edema;Mobility;ROM    Clinical Decision Making Limited treatment options, no task modification necessary    Comorbidities Affecting Occupational Performance: None    Modification or Assistance to Complete Evaluation  No modification of tasks or assist necessary to complete eval    OT Frequency 2x / week    OT Duration 8 weeks    OT Treatment/Interventions Self-care/ADL training;Therapeutic exercise;Patient/family education;Splinting;Compression bandaging;Moist Heat;Paraffin;Fluidtherapy;Therapeutic activities;Cryotherapy;Ultrasound;Contrast Bath;DME and/or AE instruction;Manual Therapy;Passive range of motion    Plan continue fluidotherapy, putty, A/ROM and P/ROM to hand, pt is perfroming A/ROM and self P/ROM to wrist within tolerated ROM, send note to MD if pt schedules appt.    Consulted and Agree with Plan of Care Patient           Patient will benefit from skilled therapeutic intervention in order to improve the following deficits and impairments:   Body Structure / Function / Physical Skills: ADL,Coordination,UE functional use,Sensation,Fascial restriction,Decreased knowledge of precautions,Decreased knowledge of use of DME,Flexibility,IADL,Pain,Skin integrity,Strength,FMC,Dexterity,Edema,Mobility,ROM       Visit Diagnosis: Stiffness of right hand, not elsewhere classified  Stiffness of right wrist, not elsewhere classified  Other disturbances of skin sensation  Muscle weakness (generalized)  Localized edema    Problem List Patient Active Problem List   Diagnosis Date Noted  . Numbness 07/30/2019  . Spinal stenosis  of lumbar region 07/30/2019  . Osteopenia after menopause 05/29/2019  . Over weight 05/29/2019  . Elevated blood pressure reading 05/29/2019  . Immunization due 08/03/2017  . Left leg swelling 05/04/2017  . Allergic rhinitis 10/11/2015  . Hypothyroidism 12/10/2013  . Peripheral neuropathy 05/16/2013    Mariah Milling, OTR/L 02/22/2021, 4:47 PM  [facility] [address] [place], [place], [address] Phone: [phone]   Fax:  [phone]  Name: Krystal Moore MRN: [id number] Date of Birth: [birth year]

## 2021-02-24 ENCOUNTER — Other Ambulatory Visit: Payer: Self-pay

## 2021-02-24 ENCOUNTER — Ambulatory Visit: Payer: PPO | Admitting: Occupational Therapy

## 2021-02-24 ENCOUNTER — Encounter: Payer: Self-pay | Admitting: Occupational Therapy

## 2021-02-24 DIAGNOSIS — R600 Localized edema: Secondary | ICD-10-CM

## 2021-02-24 DIAGNOSIS — R208 Other disturbances of skin sensation: Secondary | ICD-10-CM

## 2021-02-24 DIAGNOSIS — M25631 Stiffness of right wrist, not elsewhere classified: Secondary | ICD-10-CM

## 2021-02-24 DIAGNOSIS — M25641 Stiffness of right hand, not elsewhere classified: Secondary | ICD-10-CM

## 2021-02-24 DIAGNOSIS — M6281 Muscle weakness (generalized): Secondary | ICD-10-CM

## 2021-02-24 NOTE — Therapy (Signed)
[facility] [address] [place], [place], [address] Phone: [phone]   Fax:  [phone]  Occupational Therapy Treatment and Progress Update  Patient Details  Name: Krystal Moore MRN: [id number] Date of Birth: 08/05/1930 Referring Provider (OT): [name], PA-C  This progress report covers dates of service from 01/19/21-02/24/21.    Encounter Date: 02/24/2021   OT End of Session - 02/24/21 1510    Visit Number 10    Number of Visits 17    Date for OT Re-Evaluation 04/04/21    Authorization Type HTA No auth - VL:MN    Authorization - Visit Number 9    Progress Note Due on Visit 10    OT Start Time 1400    OT Stop Time 1453    OT Time Calculation (min) 53 min    Equipment Utilized During Treatment fluidotherapy    Activity Tolerance Patient tolerated treatment well    Behavior During Therapy [facility] for tasks assessed/performed           Past Medical History:  Diagnosis Date  . Anemia   . Narcolepsy   . Neuropathy   . Obesity   . Pneumonia   . Polyneuropathy in other diseases classified elsewhere ([place])   . Shoulder fracture, left     Past Surgical History:  Procedure Laterality Date  . CATARACT EXTRACTION Left 06/2019  . MOUTH SURGERY    . NASAL SINUS SURGERY      There were no vitals filed for this visit.   Subjective Assessment - 02/24/21 1448    Subjective  Patient reports that she was able to open a screw top bottle.    Pertinent History Pt sustained a Comminuted distal radial fracture with extension to the radiocarpal  joint on 11/28/20, pt was treated no-operatively    Currently in Pain? No/denies    Pain Score 0-No pain                        OT Treatments/Exercises (OP) - 02/24/21 0001      ADLs   ADL Comments Sent note to Ortho - patient has appointment Tuesday 5/10.      Exercises   Exercises Theraputty      Theraputty   Theraputty Hand- Locate Pegs  Locating pegs to increase tip pinch (yellow)      RUE Fluidotherapy   Number Minutes Fluidotherapy 20 Minutes    RUE Fluidotherapy Location Hand;Wrist;Forearm    Comments prior to manual therapy      Manual Therapy   Joint Mobilization Gentle thumb rotation to improve opposition/ radial abduction    Soft tissue mobilization thumb web space to increase range of motion of thumb away from palm of hand.                  OT Education - 02/24/21 1510    Education Details Reviewed progress and information sent to ortho MD    Person(s) Educated Patient    Methods Explanation;Demonstration    Comprehension Verbalized understanding            OT Short Term Goals - 02/24/21 1513      OT SHORT TERM GOAL #1   Title Patient will demonstrate effective edema management techniques due 03/05/21    Time 4    Period Weeks    Status Achieved   edema glove issued however pt using inconsistently   Target Date 03/05/21  OT SHORT TERM GOAL #2   Title Patient will complete a home exercise program designed to improve active and passive range of motion in forearm, wrist, and digits    Time 4    Period Weeks    Status Achieved      OT SHORT TERM GOAL #3   Title Patient will demonstrate ability to contact pads 2-5  of fingers to palm to help facilitate gross grasp    Time 4    Period Weeks    Status Achieved      OT SHORT TERM GOAL #4   Title Patient will demonstrate ability to oppose thumb to 4th and 5th digit tips with min assist    Time 4    Period Weeks    Status Achieved   mod I            OT Long Term Goals - 02/24/21 1513      OT LONG TERM GOAL #1   Title Patient will complete HEP designed to improve functional strength and use of dominant RUE DUE 6/13    Time 8    Period Weeks    Status On-going      OT LONG TERM GOAL #2   Title Patient will demonstrate 90% composite flexion and extension right hand    Time 8    Period Weeks    Status On-going      OT LONG  TERM GOAL #3   Title Patient will demonstrate 10 lb increase in grip strength in right hand to help with functional grip and carry    Time 8    Period Weeks    Status On-going      OT LONG TERM GOAL #4   Title Patient will demonstrate sufficient stamina and strength/range of motion in right hand 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    Patient will see MD next week.    OT Occupational Profile and History Problem Focused Assessment - Including review of records relating to presenting problem    Occupational performance deficits (Please refer to evaluation for details): ADL's;IADL's;Work;Leisure    Body Structure / Function / Physical Skills ADL;Coordination;UE functional use;Sensation;Fascial restriction;Decreased knowledge of precautions;Decreased knowledge of use of DME;Flexibility;IADL;Pain;Skin integrity;Strength;FMC;Dexterity;Edema;Mobility;ROM    Clinical Decision Making Limited treatment options, no task modification necessary    Comorbidities Affecting Occupational Performance: None    Modification or Assistance to Complete Evaluation  No modification of tasks or assist necessary to complete eval    OT Frequency 2x / week    OT Duration 8 weeks    OT Treatment/Interventions Self-care/ADL training;Therapeutic exercise;Patient/family education;Splinting;Compression bandaging;Moist Heat;Paraffin;Fluidtherapy;Therapeutic activities;Cryotherapy;Ultrasound;Contrast Bath;DME and/or AE instruction;Manual  Therapy;Passive range of motion    Plan Patient sees Ortho 5/10 - check how visit went.  Adjust POC as needed - continue fluidotherapy, putty, A/ROM and P/ROM to  hand, pt is perfroming A/ROM and self P/ROM to wrist within tolerated ROM    Consulted and Agree with Plan of Care Patient           Patient will benefit from skilled therapeutic intervention in order to improve the following deficits and impairments:   Body Structure / Function / Physical Skills: ADL,Coordination,UE functional use,Sensation,Fascial restriction,Decreased knowledge of precautions,Decreased knowledge of use of DME,Flexibility,IADL,Pain,Skin integrity,Strength,FMC,Dexterity,Edema,Mobility,ROM       Visit Diagnosis: Stiffness of right hand, not elsewhere classified  Stiffness of right wrist, not elsewhere classified  Other disturbances of skin sensation  Muscle weakness (generalized)  Localized edema    Problem List Patient Active Problem List   Diagnosis Date Noted  . Numbness 07/30/2019  . Spinal stenosis of lumbar region 07/30/2019  . Osteopenia after menopause 05/29/2019  . Over weight 05/29/2019  . Elevated blood pressure reading 05/29/2019  . Immunization due 08/03/2017  . Left leg swelling 05/04/2017  . Allergic rhinitis 10/11/2015  . Hypothyroidism 12/10/2013  . Peripheral neuropathy 05/16/2013    Mariah Milling, OTR/L 02/24/2021, 3:15 PM  [facility] [address] [place], [place], [address] Phone: [phone]   Fax:  [phone]  Name: Krystal Moore MRN: [id number] Date of Birth: June 14, 1930

## 2021-03-01 ENCOUNTER — Encounter: Payer: Self-pay | Admitting: Orthopaedic Surgery

## 2021-03-01 ENCOUNTER — Encounter: Payer: Self-pay | Admitting: Occupational Therapy

## 2021-03-01 ENCOUNTER — Ambulatory Visit (INDEPENDENT_AMBULATORY_CARE_PROVIDER_SITE_OTHER): Payer: PPO | Admitting: Orthopaedic Surgery

## 2021-03-01 ENCOUNTER — Ambulatory Visit: Payer: PPO | Admitting: Occupational Therapy

## 2021-03-01 ENCOUNTER — Other Ambulatory Visit: Payer: Self-pay

## 2021-03-01 ENCOUNTER — Ambulatory Visit: Payer: Self-pay

## 2021-03-01 VITALS — Ht 64.0 in | Wt 163.0 lb

## 2021-03-01 DIAGNOSIS — M25531 Pain in right wrist: Secondary | ICD-10-CM

## 2021-03-01 DIAGNOSIS — M25631 Stiffness of right wrist, not elsewhere classified: Secondary | ICD-10-CM

## 2021-03-01 DIAGNOSIS — M6281 Muscle weakness (generalized): Secondary | ICD-10-CM

## 2021-03-01 DIAGNOSIS — M25641 Stiffness of right hand, not elsewhere classified: Secondary | ICD-10-CM | POA: Diagnosis not present

## 2021-03-01 DIAGNOSIS — R208 Other disturbances of skin sensation: Secondary | ICD-10-CM

## 2021-03-01 DIAGNOSIS — R600 Localized edema: Secondary | ICD-10-CM

## 2021-03-01 NOTE — Therapy (Signed)
[facility] [address] [place], [place], [address] Phone: [phone]   Fax:  [phone]  Occupational Therapy Treatment  Patient Details  Name: Krystal Moore MRN: [id number] Date of Birth: April 06, 1930 Referring Provider (OT): [name], [place]   Encounter Date: 03/01/2021   OT End of Session - 03/01/21 1252    Visit Number 11    Number of Visits 17    Date for OT Re-Evaluation 04/04/21    Authorization Type HTA No auth - VL:MN    Authorization - Visit Number 11    Progress Note Due on Visit 20    OT Start Time 1020    OT Stop Time 1100    OT Time Calculation (min) 40 min    Activity Tolerance Patient tolerated treatment well    Behavior During Therapy [facility] for tasks assessed/performed           Past Medical History:  Diagnosis Date  . Anemia   . Narcolepsy   . Neuropathy   . Obesity   . Pneumonia   . Polyneuropathy in other diseases classified elsewhere ([place])   . Shoulder fracture, left     Past Surgical History:  Procedure Laterality Date  . CATARACT EXTRACTION Left 06/2019  . MOUTH SURGERY    . NASAL SINUS SURGERY      There were no vitals filed for this visit.   Subjective Assessment - 03/01/21 1027    Subjective  Pt reports seeing MD, she says he told her we can push her as much as she can tolerate    Pertinent History Pt sustained a Comminuted distal radial fracture with extension to the radiocarpal  joint on 11/28/20, pt was treated no-operatively    Currently in Pain? Yes    Pain Score 6     Pain Location Hand    Pain Orientation Right    Pain Descriptors / Indicators Aching;Burning    Pain Type Surgical pain    Pain Onset More than a month ago    Pain Frequency Constant    Aggravating Factors  movement    Pain Relieving Factors heat                      Treatment: Fluidotherapy x 10 mins to RUE for pain and stiffness. No adverse reactions.  [place] 3 mhz, 0.8  w/cm 2,  20% x 8 mins to palm, thenar eminence, and digits for pain and edema, pt demonstrates improved ROM following [place], no adverse reactions.  P/ROM composite finger flexion, MP flexion,  A/ROM finger flexion/ extension crumpling a washcloth, 10 reps Graded clothes pins 1-8 # for tip and 3 pt pinch with RUE along with functional reach, min v.c Flipping playing cards for increased functional use of RUE, finger flexion and pinch, min v.c            OT Short Term Goals - 02/24/21 1513      OT SHORT TERM GOAL #1   Title Patient will demonstrate effective edema management techniques due 03/05/21    Time 4    Period Weeks    Status Achieved   edema glove issued however pt using inconsistently   Target Date 03/05/21      OT SHORT TERM GOAL #2   Title Patient will complete a home exercise program designed to improve active and passive range of motion in forearm, wrist, and digits    Time 4    Period Weeks  Status Achieved      OT SHORT TERM GOAL #3   Title Patient will demonstrate ability to contact pads 2-5  of fingers to palm to help facilitate gross grasp    Time 4    Period Weeks    Status Achieved      OT SHORT TERM GOAL #4   Title Patient will demonstrate ability to oppose thumb to 4th and 5th digit tips with min assist    Time 4    Period Weeks    Status Achieved   mod I            OT Long Term Goals - 02/24/21 1513      OT LONG TERM GOAL #1   Title Patient will complete HEP designed to improve functional strength and use of dominant RUE DUE 6/13    Time 8    Period Weeks    Status On-going      OT LONG TERM GOAL #2   Title Patient will demonstrate 90% composite flexion and extension right hand    Time 8    Period Weeks    Status On-going      OT LONG TERM GOAL #3   Title Patient will demonstrate 10 lb increase in grip strength in right hand to help with functional grip and carry    Time 8    Period Weeks    Status On-going      OT LONG TERM GOAL  #4   Title Patient will demonstrate sufficient stamina and strength/range of motion in right hand 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 Pt reports seeing MD today. she states he said that "you can push me in therapy as much as I can stand"    OT Occupational Profile and History Problem Focused Assessment - Including review of records relating to presenting problem    Occupational performance deficits (Please refer to evaluation for details): ADL's;IADL's;Work;Leisure    Body Structure / Function / Physical Skills ADL;Coordination;UE functional use;Sensation;Fascial restriction;Decreased knowledge of precautions;Decreased knowledge of use of DME;Flexibility;IADL;Pain;Skin integrity;Strength;FMC;Dexterity;Edema;Mobility;ROM    Clinical Decision Making Limited treatment options, no task modification necessary    Comorbidities Affecting Occupational Performance: None    Modification or Assistance to Complete Evaluation  No modification of tasks or assist necessary to complete eval    OT Frequency 2x / week    OT Duration 8 weeks    OT Treatment/Interventions Self-care/ADL training;Therapeutic exercise;Patient/family education;Splinting;Compression bandaging;Moist Heat;Paraffin;Fluidtherapy;Therapeutic activities;Cryotherapy;Ultrasound;Contrast Bath;DME and/or AE instruction;Manual Therapy;Passive range of motion    Plan Continue [place] and  fluidotherapy, putty, A/ROM and P/ROM, gentle strengthening as tolerated   Consulted and Agree with Plan of Care Patient           Patient will benefit from skilled  therapeutic intervention in order to improve the following deficits and impairments:   Body Structure / Function / Physical Skills: ADL,Coordination,UE functional use,Sensation,Fascial restriction,Decreased knowledge of precautions,Decreased knowledge  of use of DME,Flexibility,IADL,Pain,Skin integrity,Strength,FMC,Dexterity,Edema,Mobility,ROM       Visit Diagnosis: Stiffness of right hand, not elsewhere classified  Stiffness of right wrist, not elsewhere classified  Other disturbances of skin sensation  Muscle weakness (generalized)  Localized edema    Problem List Patient Active Problem List   Diagnosis Date Noted  . Numbness 07/30/2019  . Spinal stenosis of lumbar region 07/30/2019  . Osteopenia after menopause 05/29/2019  . Over weight 05/29/2019  . Elevated blood pressure reading 05/29/2019  . Immunization due 08/03/2017  . Left leg swelling 05/04/2017  . Allergic rhinitis 10/11/2015  . Hypothyroidism 12/10/2013  . Peripheral neuropathy 05/16/2013    Krystal Moore 03/01/2021, 12:59 PM Theone Murdoch, OTR/L [phone] Phone: [phone] 1:07 PM 03/01/21 [facility] [address] [place], [place], [address] Phone: [phone]   Fax:  [phone]  Name: Krystal Moore MRN: [id number] Date of Birth: 1930-08-12

## 2021-03-01 NOTE — Progress Notes (Signed)
Office Visit Note   Patient: Krystal Moore           Date of Birth: [birth year]           MRN: [id number] Visit Date: 03/01/2021              Requested by: Ladell Pier, MD [address] [place],  [place] [address] PCP: Ladell Pier, MD   Assessment & Plan: Visit Diagnoses:  1. Pain in right wrist     Plan: Impression is right hand swelling and also suspect carpal tunnel syndrome.  I feel that the swelling is likely posttraumatic changes which should in my experience resolve with some time but I do feel that we need to check for carpal tunnel syndrome that she may have developed posttraumatic Lee from the distal radius fracture.  We will see her back after the nerve conduction studies.  Follow-Up Instructions: Return for After NCS.   Orders:  Orders Placed This Encounter  Procedures  . XR Wrist Complete Right  . Ambulatory referral to Physical Medicine Rehab   No orders of the defined types were placed in this encounter.     Procedures: No procedures performed   Clinical Data: No additional findings.   Subjective: Chief Complaint  Patient presents with  . Right Hand - Pain, Numbness    Patient returns today for pain and swelling in her right wrist and fingers.  She is having trouble playing her piano.  She denies any wrist pain.  Her symptoms are all mainly limited to the hand and the fingers.  She has trouble making a fist.  She is about 3 months status post closed treatment of a intra-articular distal radius fracture.   Review of Systems   Objective: Vital Signs: Ht 5\' 4"  (1.626 m)   Wt 163 lb (73.9 kg)   BMI 27.98 kg/m   Physical Exam  Ortho Exam Right wrist shows decent range of motion without pain.  She does have moderate swelling to the hand and the fingers and this is limiting her ability to make a full composite fist.  She endorses numbness and tingling in the hand and fingers.  Positive carpal tunnel compressive  signs. Specialty Comments:  No specialty comments available.  Imaging: No results found.   PMFS History: Patient Active Problem List   Diagnosis Date Noted  . Numbness 07/30/2019  . Spinal stenosis of lumbar region 07/30/2019  . Osteopenia after menopause 05/29/2019  . Over weight 05/29/2019  . Elevated blood pressure reading 05/29/2019  . Immunization due 08/03/2017  . Left leg swelling 05/04/2017  . Allergic rhinitis 10/11/2015  . Hypothyroidism 12/10/2013  . Peripheral neuropathy 05/16/2013   Past Medical History:  Diagnosis Date  . Anemia   . Narcolepsy   . Neuropathy   . Obesity   . Pneumonia   . Polyneuropathy in other diseases classified elsewhere ([place])   . Shoulder fracture, left     Family History  Problem Relation Age of Onset  . Stroke Mother        questionable  . Clotting disorder Mother   . Heart attack Father     Past Surgical History:  Procedure Laterality Date  . CATARACT EXTRACTION Left 06/2019  . MOUTH SURGERY    . NASAL SINUS SURGERY     Social History   Occupational History  . Not on file  Tobacco Use  . Smoking status: Never Smoker  . Smokeless tobacco: Never Used  Substance and  Sexual Activity  . Alcohol use: Yes    Comment: 1/4 glass per day  . Drug use: No  . Sexual activity: Not Currently

## 2021-03-03 ENCOUNTER — Ambulatory Visit: Payer: PPO | Admitting: Occupational Therapy

## 2021-03-03 ENCOUNTER — Encounter: Payer: Self-pay | Admitting: Occupational Therapy

## 2021-03-03 ENCOUNTER — Other Ambulatory Visit: Payer: Self-pay

## 2021-03-03 DIAGNOSIS — M25631 Stiffness of right wrist, not elsewhere classified: Secondary | ICD-10-CM

## 2021-03-03 DIAGNOSIS — R600 Localized edema: Secondary | ICD-10-CM

## 2021-03-03 DIAGNOSIS — R208 Other disturbances of skin sensation: Secondary | ICD-10-CM

## 2021-03-03 DIAGNOSIS — M25641 Stiffness of right hand, not elsewhere classified: Secondary | ICD-10-CM | POA: Diagnosis not present

## 2021-03-03 DIAGNOSIS — M6281 Muscle weakness (generalized): Secondary | ICD-10-CM

## 2021-03-03 NOTE — Therapy (Signed)
[facility] [address] [place], [place], [address] Phone: [phone]   Fax:  [phone]  Occupational Therapy Treatment  Patient Details  Name: Krystal Moore MRN: [id number] Date of Birth: 01-15-1930 Referring Provider (OT): [name], [place]   Encounter Date: 03/03/2021   OT End of Session - 03/03/21 1852    Visit Number 12    Number of Visits 17    Date for OT Re-Evaluation 04/04/21    Authorization Type HTA No auth - VL:MN    Authorization - Visit Number 12    Progress Note Due on Visit 20    OT Start Time 1617    OT Stop Time 1700    OT Time Calculation (min) 43 min    Equipment Utilized During Treatment fluidotherapy    Activity Tolerance Patient tolerated treatment well    Behavior During Therapy [facility] for tasks assessed/performed           Past Medical History:  Diagnosis Date  . Anemia   . Narcolepsy   . Neuropathy   . Obesity   . Pneumonia   . Polyneuropathy in other diseases classified elsewhere ([place])   . Shoulder fracture, left     Past Surgical History:  Procedure Laterality Date  . CATARACT EXTRACTION Left 06/2019  . MOUTH SURGERY    . NASAL SINUS SURGERY      There were no vitals filed for this visit.   Subjective Assessment - 03/03/21 1628    Subjective  Patient reports burning in fingers    Pertinent History Pt sustained a Comminuted distal radial fracture with extension to the radiocarpal  joint on 11/28/20, pt was treated no-operatively    Currently in Pain? No/denies    Pain Score 0-No pain                        OT Treatments/Exercises (OP) - 03/03/21 0001      RUE Fluidotherapy   Number Minutes Fluidotherapy 12 Minutes    RUE Fluidotherapy Location Hand;Wrist;Forearm    Comments prior to manual      Manual Therapy   Joint Mobilization Thumb, wrist, digit range of motion.  Light weight bearing thru heel of hand on table and rocking forward,  backward, side to side to stretch digits, thumb, radial and ulanr aspects of wrist.  Patient able to determine speed, pressure, and range of motion.    Soft tissue mobilization Self soft tissue mobilization with massage ball                  OT Education - 03/03/21 1852    Education Details massage ball    Person(s) Educated Patient    Methods Explanation;Demonstration;Tactile cues;Verbal cues    Comprehension Verbalized understanding;Returned demonstration;Need further instruction            OT Short Term Goals - 03/03/21 1854      OT SHORT TERM GOAL #1   Title Patient will demonstrate effective edema management techniques due 03/05/21    Time 4    Period Weeks    Status Achieved   edema glove issued however pt using inconsistently   Target Date 03/05/21      OT SHORT TERM GOAL #2   Title Patient will complete a home exercise program designed to improve active and passive range of motion in forearm, wrist, and digits    Time 4    Period Weeks    Status Achieved  OT SHORT TERM GOAL #3   Title Patient will demonstrate ability to contact pads 2-5  of fingers to palm to help facilitate gross grasp    Time 4    Period Weeks    Status Achieved      OT SHORT TERM GOAL #4   Title Patient will demonstrate ability to oppose thumb to 4th and 5th digit tips with min assist    Time 4    Period Weeks    Status Achieved   mod I            OT Long Term Goals - 03/03/21 1854      OT LONG TERM GOAL #1   Title Patient will complete HEP designed to improve functional strength and use of dominant RUE DUE 6/13    Time 8    Period Weeks    Status On-going      OT LONG TERM GOAL #2   Title Patient will demonstrate 90% composite flexion and extension right hand    Time 8    Period Weeks    Status On-going      OT LONG TERM GOAL #3   Title Patient will demonstrate 10 lb increase in grip strength in right hand to help with functional grip and carry    Time 8    Period  Weeks    Status On-going      OT LONG TERM GOAL #4   Title Patient will demonstrate sufficient stamina and strength/range of motion in right hand 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  Patient with improving composite flexion in hand, wrist extension.  Needs continued work for wrist flexion, thumb abduction, opposition, flexion, and radial deviation.    OT Occupational Profile and History Problem Focused Assessment - Including review of records relating to presenting problem    Occupational performance deficits (Please refer to evaluation for details): ADL's;IADL's;Work;Leisure    Body Structure / Function / Physical Skills ADL;Coordination;UE functional use;Sensation;Fascial restriction;Decreased knowledge of precautions;Decreased knowledge of use of DME;Flexibility;IADL;Pain;Skin integrity;Strength;FMC;Dexterity;Edema;Mobility;ROM    Clinical Decision Making Limited treatment options, no task modification necessary    Comorbidities Affecting Occupational Performance: None    Modification or Assistance to Complete Evaluation  No modification of tasks or assist necessary to complete eval    OT Frequency 2x / week    OT Duration 8 weeks    OT Treatment/Interventions Self-care/ADL training;Therapeutic exercise;Patient/family education;Splinting;Compression bandaging;Moist Heat;Paraffin;Fluidtherapy;Therapeutic activities;Cryotherapy;Ultrasound;Contrast Bath;DME and/or AE instruction;Manual Therapy;Passive range of motion    Plan continue fluidotherapy, putty, A/ROM and P/ROM to hand, pt is perfroming A/ROM and  self P/ROM to wrist within tolerated ROM    Consulted and Agree with Plan of Care Patient           Patient will benefit from skilled therapeutic intervention in order to improve the following deficits and impairments:   Body Structure / Function / Physical Skills: ADL,Coordination,UE functional use,Sensation,Fascial restriction,Decreased knowledge of  precautions,Decreased knowledge of use of DME,Flexibility,IADL,Pain,Skin integrity,Strength,FMC,Dexterity,Edema,Mobility,ROM       Visit Diagnosis: Stiffness of right hand, not elsewhere classified  Stiffness of right wrist, not elsewhere classified  Other disturbances of skin sensation  Muscle weakness (generalized)  Localized edema    Problem List Patient Active Problem List   Diagnosis Date Noted  . Numbness 07/30/2019  . Spinal stenosis of lumbar region 07/30/2019  . Osteopenia after menopause 05/29/2019  . Over weight 05/29/2019  . Elevated blood pressure reading 05/29/2019  . Immunization due 08/03/2017  . Left leg swelling 05/04/2017  . Allergic rhinitis 10/11/2015  . Hypothyroidism 12/10/2013  . Peripheral neuropathy 05/16/2013    Mariah Milling, OTR/L 03/03/2021, 6:55 PM  [facility] [address] [place], [place], [address] Phone: [phone]   Fax:  [phone]  Name: Krystal Moore MRN: [id number] Date of Birth: Mar 14, 1930

## 2021-03-08 ENCOUNTER — Other Ambulatory Visit: Payer: Self-pay

## 2021-03-08 ENCOUNTER — Ambulatory Visit: Payer: PPO | Admitting: Occupational Therapy

## 2021-03-08 DIAGNOSIS — M25641 Stiffness of right hand, not elsewhere classified: Secondary | ICD-10-CM | POA: Diagnosis not present

## 2021-03-08 DIAGNOSIS — M25631 Stiffness of right wrist, not elsewhere classified: Secondary | ICD-10-CM

## 2021-03-08 DIAGNOSIS — R208 Other disturbances of skin sensation: Secondary | ICD-10-CM

## 2021-03-08 DIAGNOSIS — M6281 Muscle weakness (generalized): Secondary | ICD-10-CM

## 2021-03-08 DIAGNOSIS — R600 Localized edema: Secondary | ICD-10-CM

## 2021-03-08 NOTE — Patient Instructions (Signed)
PROM: Wrist Flexion / Extension   Grasp  hand and slowly bend wrist until stretch is felt. Relax. Then stretch as far as possible in opposite direction. Be sure to keep elbow bent.  Hold __10__ sec. each way Repeat _5___ times per set.    Do _4-6___ sessions per day.  Pronation (Passive)   Keep elbow bent at right angle and held firmly to side. Use other hand to turn forearm until palm faces downward. Hold _10___ seconds. Repeat __10__ times. Do _3___ sessions per day.  Opposition (Active)   Touch tip of thumb to nail tip of each finger in turn, making an "O" shape. Repeat __10__ times. Do _4-6___ sessions per day.   MP Flexion (Active)   Bend thumb to touch base of little finger, keeping tip joint straight. Repeat __10-15__ times. Do _4-6___ sessions per day.       Composite Abduction (Active)    With thumb out to side, swing down, pointing away from palm. Return. Repeat _10___ times. Do _3___ sessions per day.  Copyright  VHI. All rights reserved.  Finger Extension / Thumb Abduction: Resisted    With rubber band around right thumb and ____right hand____ fingers, hand slightly cupped, gently spread thumb and fingers apart. Repeat ___10_ times per set. Do _1___ sets per session. Do ___2_ sessions per day.  Copyright  VHI. All rights reserved.

## 2021-03-08 NOTE — Therapy (Signed)
[facility] [address] [place], [place], [address] Phone: [phone]   Fax:  [phone]  Occupational Therapy Treatment  Patient Details  Name: Krystal Moore MRN: [id number] Date of Birth: 14-Aug-1930 Referring Provider (OT): [name], [place]   Encounter Date: 03/08/2021   OT End of Session - 03/08/21 1026    Visit Number 13    Number of Visits 17    Date for OT Re-Evaluation 04/04/21    Authorization Type HTA No auth - VL:MN    Authorization - Visit Number 13    Progress Note Due on Visit 20    OT Start Time 1018    OT Stop Time 1100    OT Time Calculation (min) 42 min    Activity Tolerance Patient tolerated treatment well    Behavior During Therapy [facility] for tasks assessed/performed           Past Medical History:  Diagnosis Date  . Anemia   . Narcolepsy   . Neuropathy   . Obesity   . Pneumonia   . Polyneuropathy in other diseases classified elsewhere ([place])   . Shoulder fracture, left     Past Surgical History:  Procedure Laterality Date  . CATARACT EXTRACTION Left 06/2019  . MOUTH SURGERY    . NASAL SINUS SURGERY      There were no vitals filed for this visit.   Subjective Assessment - 03/08/21 1023    Subjective  Pt reports significant pain and buring in fingers    Pertinent History Pt sustained a Comminuted distal radial fracture with extension to the radiocarpal  joint on 11/28/20, pt was treated no-operatively    Currently in Pain? Yes    Pain Score 5     Pain Location Hand    Pain Orientation Right    Pain Descriptors / Indicators Aching;Burning    Pain Type Chronic pain;Surgical pain    Pain Onset More than a month ago    Pain Frequency Intermittent    Aggravating Factors  movement/ touch    Pain Relieving Factors heat                    Ttreatment:Fluidotherapy x 11 mins to RUE for pain and stiffness, no adverse reactions Crumpling washcloth with R hand  then extending fingers to flatten, 10 reps A/ROM/P/ROM wrist flexion extension followed by wrist flexion/ extension with 1 lbs weight Pt attempted ulnar and radial deviation with 1 lbs weight and pt experienced discomfort so task was discontinued. Finger thumb opposition, radial and palmar abduction, then finger thumb abduction with rubberband x 10 reps A/ROM and P/ROM supination/ pronation. Dealing cards with thumb of right hand, min-mod difficulty/ v.c Therapist added to HEP, see pt instructions              OT Short Term Goals - 03/03/21 1854      OT SHORT TERM GOAL #1   Title Patient will demonstrate effective edema management techniques due 03/05/21    Time 4    Period Weeks    Status Achieved   edema glove issued however pt using inconsistently   Target Date 03/05/21      OT SHORT TERM GOAL #2   Title Patient will complete a home exercise program designed to improve active and passive range of motion in forearm, wrist, and digits    Time 4    Period Weeks    Status Achieved      OT SHORT  TERM GOAL #3   Title Patient will demonstrate ability to contact pads 2-5  of fingers to palm to help facilitate gross grasp    Time 4    Period Weeks    Status Achieved      OT SHORT TERM GOAL #4   Title Patient will demonstrate ability to oppose thumb to 4th and 5th digit tips with min assist    Time 4    Period Weeks    Status Achieved   mod I            OT Long Term Goals - 03/03/21 1854      OT LONG TERM GOAL #1   Title Patient will complete HEP designed to improve functional strength and use of dominant RUE DUE 6/13    Time 8    Period Weeks    Status On-going      OT LONG TERM GOAL #2   Title Patient will demonstrate 90% composite flexion and extension right hand    Time 8    Period Weeks    Status On-going      OT LONG TERM GOAL #3   Title Patient will demonstrate 10 lb increase in grip strength in right hand to help with functional grip and carry     Time 8    Period Weeks    Status On-going      OT LONG TERM GOAL #4   Title Patient will demonstrate sufficient stamina and strength/range of motion in right hand 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    Time 8    Period Weeks    Status Achieved      OT LONG TERM GOAL #5   Title Patient will demonstrate at least 5 lb increase in pinch strength in right hand to aide with opening packages, holding small items    Time 8    Period Weeks    Status On-going                 Plan - 03/08/21 1024    Clinical Impression Statement Pt continues to progress towards goals.  Patient with improving composite flexion in hand, wrist extension and light functional use.    OT Occupational Profile and History Problem Focused Assessment - Including review of records relating to presenting problem    Occupational performance deficits (Please refer to evaluation for details): ADL's;IADL's;Work;Leisure    Body Structure / Function / Physical Skills ADL;Coordination;UE functional use;Sensation;Fascial restriction;Decreased knowledge of precautions;Decreased knowledge of use of DME;Flexibility;IADL;Pain;Skin integrity;Strength;FMC;Dexterity;Edema;Mobility;ROM    Clinical Decision Making Limited treatment options, no task modification necessary    Comorbidities Affecting Occupational Performance: None    Modification or Assistance to Complete Evaluation  No modification of tasks or assist necessary to complete eval    OT Frequency 2x / week    OT Duration 8 weeks    OT Treatment/Interventions Self-care/ADL training;Therapeutic exercise;Patient/family education;Splinting;Compression bandaging;Moist Heat;Paraffin;Fluidtherapy;Therapeutic activities;Cryotherapy;Ultrasound;Contrast Bath;DME and/or AE instruction;Manual Therapy;Passive range of motion    Plan continue fluidotherapy, putty, A/ROM and P/ROM to hand,  functional use    Consulted and Agree with Plan of Care Patient            Patient will benefit from skilled therapeutic intervention in order to improve the following deficits and impairments:   Body Structure / Function / Physical Skills: ADL,Coordination,UE functional use,Sensation,Fascial restriction,Decreased knowledge of precautions,Decreased knowledge of use of DME,Flexibility,IADL,Pain,Skin integrity,Strength,FMC,Dexterity,Edema,Mobility,ROM       Visit Diagnosis: Stiffness of right hand, not elsewhere  classified  Stiffness of right wrist, not elsewhere classified  Other disturbances of skin sensation  Muscle weakness (generalized)  Localized edema    Problem List Patient Active Problem List   Diagnosis Date Noted  . Numbness 07/30/2019  . Spinal stenosis of lumbar region 07/30/2019  . Osteopenia after menopause 05/29/2019  . Over weight 05/29/2019  . Elevated blood pressure reading 05/29/2019  . Immunization due 08/03/2017  . Left leg swelling 05/04/2017  . Allergic rhinitis 10/11/2015  . Hypothyroidism 12/10/2013  . Peripheral neuropathy 05/16/2013    Maddalyn Lutze 03/08/2021, 12:04 PM  [facility] [address] [place], [place], [address] Phone: [phone]   Fax:  [phone]  Name: Krystal Moore MRN: [id number] Date of Birth: 1930/08/09

## 2021-03-10 ENCOUNTER — Other Ambulatory Visit: Payer: Self-pay

## 2021-03-10 ENCOUNTER — Ambulatory Visit: Payer: PPO | Admitting: Occupational Therapy

## 2021-03-10 ENCOUNTER — Encounter: Payer: Self-pay | Admitting: Occupational Therapy

## 2021-03-10 DIAGNOSIS — R208 Other disturbances of skin sensation: Secondary | ICD-10-CM

## 2021-03-10 DIAGNOSIS — M6281 Muscle weakness (generalized): Secondary | ICD-10-CM

## 2021-03-10 DIAGNOSIS — R600 Localized edema: Secondary | ICD-10-CM

## 2021-03-10 DIAGNOSIS — M25631 Stiffness of right wrist, not elsewhere classified: Secondary | ICD-10-CM

## 2021-03-10 DIAGNOSIS — M25641 Stiffness of right hand, not elsewhere classified: Secondary | ICD-10-CM

## 2021-03-10 NOTE — Therapy (Signed)
[facility] [address] [place], [place], [address] Phone: [phone]   Fax:  [phone]  Occupational Therapy Treatment  Patient Details  Name: Krystal Moore MRN: [id number] Date of Birth: 1930/02/10 Referring Provider (OT): [name], [place]   Encounter Date: 03/10/2021   OT End of Session - 03/10/21 1509    Visit Number 14    Number of Visits 17    Date for OT Re-Evaluation 04/04/21    Authorization Type HTA No auth - VL:MN    Authorization - Visit Number 14    Progress Note Due on Visit 20    OT Start Time 7106    OT Stop Time 1445    OT Time Calculation (min) 40 min    Equipment Utilized During Treatment paraffin    Activity Tolerance Patient tolerated treatment well    Behavior During Therapy [facility] for tasks assessed/performed           Past Medical History:  Diagnosis Date  . Anemia   . Narcolepsy   . Neuropathy   . Obesity   . Pneumonia   . Polyneuropathy in other diseases classified elsewhere ([place])   . Shoulder fracture, left     Past Surgical History:  Procedure Laterality Date  . CATARACT EXTRACTION Left 06/2019  . MOUTH SURGERY    . NASAL SINUS SURGERY      There were no vitals filed for this visit.   Subjective Assessment - 03/10/21 1456    Subjective  Pt reports improved hand strength    Pertinent History Pt sustained a Comminuted distal radial fracture with extension to the radiocarpal  joint on 11/28/20, pt was treated no-operatively    Currently in Pain? 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  Patient will video herself playing a challenging piece to address finger coordiantion, stretch for cords, and timing, pressure, etc.    ADL Comments Discussed extending plan of care to  further address OT goals and address remaining symptoms.      Hand Exercises   Other Hand Exercises Digi extend - 10 reps, digiflex 1.5 lb individual and composite flex x 10      RUE Paraffin   Number Minutes Paraffin 10 Minutes    RUE Paraffin Location Hand   wrist     Manual Therapy   Joint Mobilization wrist to forearm in standing with hands on countertop.  Weight shifting to address extension toward fingers, as well as radial ulnar side stretch.  Mobilization to MCP - specifically index and long for flex/ext.                    OT Short Term Goals - 03/10/21 1512      OT SHORT TERM GOAL #1   Title Patient will demonstrate effective edema management techniques due 03/05/21    Time 4    Period Weeks    Status Achieved   edema glove issued however pt using inconsistently   Target Date 03/05/21      OT SHORT TERM GOAL #2   Title Patient will complete a home exercise program designed to improve active and passive range of motion in  forearm, wrist, and digits    Time 4    Period Weeks    Status Achieved      OT SHORT TERM GOAL #3   Title Patient will demonstrate ability to contact pads 2-5  of fingers to palm to help facilitate gross grasp    Time 4    Period Weeks    Status Achieved      OT SHORT TERM GOAL #4   Title Patient will demonstrate ability to oppose thumb to 4th and 5th digit tips with min assist    Time 4    Period Weeks    Status Achieved   mod I            OT Long Term Goals - 03/10/21 1512      OT LONG TERM GOAL #1   Title Patient will complete HEP designed to improve functional strength and use of dominant RUE DUE 6/13    Time 8    Period Weeks    Status On-going      OT LONG TERM GOAL #2   Title Patient will demonstrate 90% composite flexion and extension right hand    Time 8    Period Weeks    Status On-going      OT LONG TERM GOAL #3   Title Patient will demonstrate 10 lb increase in grip strength in right hand to help with  functional grip and carry    Time 8    Period Weeks    Status On-going      OT LONG TERM GOAL #4   Title Patient will demonstrate sufficient stamina and strength/range of motion in right hand 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    Time 8    Period Weeks    Status Achieved      OT LONG TERM GOAL #5   Title Patient will demonstrate at least 5 lb increase in pinch strength in right hand to aide with opening packages, holding small items    Time 8    Period Weeks    Status On-going                 Plan - 03/10/21 1510    Clinical Impression Statement Pt showing steady improvement with range of motion and strength in hand and wrist, yet still experiencing burning, stiffness, and limited functional range for piano playing.  Anticipate extending plan of care to address reamining impairments    OT Occupational Profile and History Problem Focused Assessment - Including review of records relating to presenting problem    Occupational performance deficits (Please refer to evaluation for details): ADL's;IADL's;Work;Leisure    Body Structure / Function / Physical Skills ADL;Coordination;UE functional use;Sensation;Fascial restriction;Decreased knowledge of precautions;Decreased knowledge of use of DME;Flexibility;IADL;Pain;Skin integrity;Strength;FMC;Dexterity;Edema;Mobility;ROM    Clinical Decision Making Limited treatment options, no task modification necessary    Comorbidities Affecting Occupational Performance: None    Modification or Assistance to Complete Evaluation  No modification of tasks or assist necessary to complete eval    OT Frequency 2x / week    OT Duration 8 weeks    OT Treatment/Interventions Self-care/ADL training;Therapeutic exercise;Patient/family education;Splinting;Compression bandaging;Moist Heat;Paraffin;Fluidtherapy;Therapeutic activities;Cryotherapy;Ultrasound;Contrast Bath;DME and/or AE instruction;Manual Therapy;Passive range of motion    Plan  continue fluidotherapy or paraffin, putty, A/ROM and P/ROM to hand,  functional use    Consulted and Agree with Plan of Care Patient           Patient will benefit from skilled therapeutic intervention in  order to improve the following deficits and impairments:   Body Structure / Function / Physical Skills: ADL,Coordination,UE functional use,Sensation,Fascial restriction,Decreased knowledge of precautions,Decreased knowledge of use of DME,Flexibility,IADL,Pain,Skin integrity,Strength,FMC,Dexterity,Edema,Mobility,ROM       Visit Diagnosis: Stiffness of right hand, not elsewhere classified  Stiffness of right wrist, not elsewhere classified  Other disturbances of skin sensation  Muscle weakness (generalized)  Localized edema    Problem List Patient Active Problem List   Diagnosis Date Noted  . Numbness 07/30/2019  . Spinal stenosis of lumbar region 07/30/2019  . Osteopenia after menopause 05/29/2019  . Over weight 05/29/2019  . Elevated blood pressure reading 05/29/2019  . Immunization due 08/03/2017  . Left leg swelling 05/04/2017  . Allergic rhinitis 10/11/2015  . Hypothyroidism 12/10/2013  . Peripheral neuropathy 05/16/2013    Mariah Milling , OTR/L 03/10/2021, 3:13 PM  [facility] [address] [place], [place], [address] Phone: [phone]   Fax:  [phone]  Name: Krystal Moore MRN: [id number] Date of Birth: [birth year]

## 2021-03-15 ENCOUNTER — Ambulatory Visit: Payer: PPO | Admitting: Occupational Therapy

## 2021-03-16 ENCOUNTER — Telehealth: Payer: Self-pay | Admitting: Internal Medicine

## 2021-03-16 ENCOUNTER — Other Ambulatory Visit: Payer: Self-pay | Admitting: Internal Medicine

## 2021-03-16 ENCOUNTER — Encounter: Payer: Self-pay | Admitting: Internal Medicine

## 2021-03-16 NOTE — Telephone Encounter (Signed)
FYI

## 2021-03-16 NOTE — Telephone Encounter (Signed)
Copied from [place] [phone]. Topic: General - Other >> Mar 15, 2021  4:42 PM Pawlus, Brayton Layman A wrote: Reason for CRM: Pt wanted to let Dr [name] that she discontinued taking DULoxetine (CYMBALTA) 30 MG capsule because it gave her diarrhea.

## 2021-03-17 ENCOUNTER — Ambulatory Visit: Payer: PPO | Admitting: Occupational Therapy

## 2021-03-17 ENCOUNTER — Encounter: Payer: Self-pay | Admitting: Occupational Therapy

## 2021-03-17 ENCOUNTER — Other Ambulatory Visit: Payer: Self-pay

## 2021-03-17 DIAGNOSIS — M25641 Stiffness of right hand, not elsewhere classified: Secondary | ICD-10-CM | POA: Diagnosis not present

## 2021-03-17 DIAGNOSIS — R600 Localized edema: Secondary | ICD-10-CM

## 2021-03-17 DIAGNOSIS — M6281 Muscle weakness (generalized): Secondary | ICD-10-CM

## 2021-03-17 DIAGNOSIS — M25631 Stiffness of right wrist, not elsewhere classified: Secondary | ICD-10-CM

## 2021-03-17 DIAGNOSIS — R208 Other disturbances of skin sensation: Secondary | ICD-10-CM

## 2021-03-17 NOTE — Therapy (Signed)
[facility] [address] [place], [place], [address] Phone: [phone]   Fax:  [phone]  Occupational Therapy Treatment  Patient Details  Name: Krystal Moore MRN: [id number] Date of Birth: 29-Mar-1930 Referring Provider (OT): [name], [place]   Encounter Date: 03/17/2021   OT End of Session - 03/17/21 1502    Visit Number 15    Number of Visits 31    Date for OT Re-Evaluation 05/16/21    Authorization Type HTA No auth - VL:MN    Progress Note Due on Visit 20    OT Start Time 1400    OT Stop Time 1445    OT Time Calculation (min) 45 min    Activity Tolerance Patient tolerated treatment well    Behavior During Therapy [facility] for tasks assessed/performed           Past Medical History:  Diagnosis Date  . Anemia   . Narcolepsy   . Neuropathy   . Obesity   . Pneumonia   . Polyneuropathy in other diseases classified elsewhere ([place])   . Shoulder fracture, left     Past Surgical History:  Procedure Laterality Date  . CATARACT EXTRACTION Left 06/2019  . MOUTH SURGERY    . NASAL SINUS SURGERY      There were no vitals filed for this visit.   Subjective Assessment - 03/17/21 1413    Subjective  I hit an octave!  Patient excited about improving stretch of hand to reach across eight keys    Pertinent History Pt sustained a Comminuted distal radial fracture with extension to the radiocarpal  joint on 11/28/20, pt was treated no-operatively    Currently in Pain? No/denies    Pain Score 0-No pain              [facility] OT Assessment - 03/17/21 0001      AROM   Overall AROM Comments wrist extension 45 / 45 flexion      Right Hand AROM   R Thumb MCP 0-60 60 Degrees    R Thumb Radial ABduction/ADduction 0-55 40    R Index  MCP 0-90 75 Degrees    R Index PIP 0-100 95 Degrees    R Long  MCP 0-90 90 Degrees    R Long PIP 0-100 95 Degrees    R Ring  MCP 0-90 90 Degrees    R Ring PIP 0-100 95  Degrees    R Little  MCP 0-90 90 Degrees    R Little PIP 0-100 95 Degrees      Hand Function   Right Hand Grip (lbs) 17.8    Right Hand Lateral Pinch 10 lbs                    OT Treatments/Exercises (OP) - 03/17/21 0001      ADLs   ADL Comments Reviewed remaining goals and discussed potential new short and long term goals.  Patient very pleased with improved range of motion and strength.  Upgraded to red putty.      RUE Fluidotherapy   Number Minutes Fluidotherapy 10 Minutes    RUE Fluidotherapy Location Hand;Wrist;Forearm    Comments Exercise within, and prior to stretch/manual                  OT Education - 03/17/21 1502    Education Details reviewed goals and progress.  Upgraded to red putty    Person(s) Educated Patient  Methods Explanation    Comprehension Verbalized understanding            OT Short Term Goals - 03/17/21 1509      OT SHORT TERM GOAL #1   Title Patient will demonstrate effective edema management techniques due 03/05/21    Time 4    Period Weeks    Status Achieved   edema glove issued however pt using inconsistently   Target Date 03/05/21      OT SHORT TERM GOAL #2   Title Patient will complete a home exercise program designed to improve active and passive range of motion in forearm, wrist, and digits    Time 4    Period Weeks    Status Achieved      OT SHORT TERM GOAL #3   Title Patient will demonstrate ability to contact pads 2-5  of fingers to palm to help facilitate gross grasp    Time 4    Period Weeks    Status Achieved      OT SHORT TERM GOAL #4   Title Patient will demonstrate ability to oppose thumb to 4th and 5th digit tips with min assist    Time 4    Period Weeks    Status Achieved   mod I     OT SHORT TERM GOAL #5   Title Patient will demonstrate 12 lb grip strength in RUE DUE 6/25    Time 4    Period Weeks    Status New    Target Date 04/16/21             OT Long Term Goals - 03/17/21 1437       OT LONG TERM GOAL #1   Title Patient will complete HEP designed to improve functional strength and use of dominant RUE DUE 6/13    Time 8    Period Weeks    Status On-going      OT LONG TERM GOAL #2   Title Patient will demonstrate 90% composite flexion and extension right hand    Time 8    Period Weeks    Status Achieved      OT LONG TERM GOAL #3   Title Patient will demonstrate 10 lb increase in grip strength in right hand to help with functional grip and carry    Time 8    Period Weeks    Status Achieved      OT LONG TERM GOAL #4   Title Patient will demonstrate sufficient stamina and strength/range of motion in right hand 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    Time 8    Period Weeks    Status Achieved      OT LONG TERM GOAL #5   Title Patient will demonstrate at least 5 lb increase in pinch strength in right hand to aide with opening packages, holding small items    Time 8    Period Weeks    Status Achieved      Long Term Additional Goals   Additional Long Term Goals Yes      OT LONG TERM GOAL #6   Title Patient will demonstrate improved wrist and forearm strength as evidenced by her ability to water plants with RUE without support of left hand due 7/25    Time 8    Period Weeks    Status New    Target Date 05/16/21      OT LONG TERM GOAL #7   Title Patient  will self report less discomfort and digit recoil when using power strike on keys in piano piece.    Time 8    Period Weeks    Status New      OT LONG TERM GOAL #8   Title Patient will demonstrate improved pincer grasp and bimanual skills to be able to zip choir robe in reasonable timeframe.    Time 8    Period Weeks    Status New      OT LONG TERM GOAL  #9   TITLE Patient will improve grip strength to at least 20 lbs RUE    Time 8    Period Weeks    Status New      OT LONG TERM GOAL  #10   TITLE Patient will report improved speed and flexibility with complicated piano piece  Loetta Rough or Chopin- per patient)    Time 8    Period Weeks    Status New                 Plan - 03/17/21 1503    Clinical Impression Statement Pt has made significant improvement in right hand/wrist range of motion, strength, and functional use.  She continues to demonstrate nerve pain and decreased circulation to fingertips - specifically digit 2,3.  Patient will benefit from continued OT to address new goals, and to further progress functional use of dominant RUE.  Patient is a Secondary school teacher, and has been working dilligently for improved functional hand use.    OT Occupational Profile and History Problem Focused Assessment - Including review of records relating to presenting problem    Occupational performance deficits (Please refer to evaluation for details): ADL's;IADL's;Work;Leisure    Body Structure / Function / Physical Skills ADL;Coordination;UE functional use;Sensation;Fascial restriction;Decreased knowledge of precautions;Decreased knowledge of use of DME;Flexibility;IADL;Pain;Skin integrity;Strength;FMC;Dexterity;Edema;Mobility;ROM    Clinical Decision Making Limited treatment options, no task modification necessary    Comorbidities Affecting Occupational Performance: None    Modification or Assistance to Complete Evaluation  No modification of tasks or assist necessary to complete eval    OT Frequency 2x / week    OT Duration 8 weeks    OT Treatment/Interventions Self-care/ADL training;Therapeutic exercise;Patient/family education;Splinting;Compression bandaging;Moist Heat;Paraffin;Fluidtherapy;Therapeutic activities;Cryotherapy;Ultrasound;Contrast Bath;DME and/or AE instruction;Manual Therapy;Passive range of motion    Plan continue fluidotherapy or paraffin, putty, A/ROM and P/ROM to hand,  functional use - review new goals.    Consulted and Agree with Plan of Care Patient           Patient will benefit from skilled therapeutic intervention in order to improve the  following deficits and impairments:   Body Structure / Function / Physical Skills: ADL,Coordination,UE functional use,Sensation,Fascial restriction,Decreased knowledge of precautions,Decreased knowledge of use of DME,Flexibility,IADL,Pain,Skin integrity,Strength,FMC,Dexterity,Edema,Mobility,ROM       Visit Diagnosis: Stiffness of right hand, not elsewhere classified - Plan: Ot plan of care cert/re-cert  Stiffness of right wrist, not elsewhere classified - Plan: Ot plan of care cert/re-cert  Other disturbances of skin sensation - Plan: Ot plan of care cert/re-cert  Muscle weakness (generalized) - Plan: Ot plan of care cert/re-cert  Localized edema - Plan: Ot plan of care cert/re-cert    Problem List Patient Active Problem List   Diagnosis Date Noted  . Numbness 07/30/2019  . Spinal stenosis of lumbar region 07/30/2019  . Osteopenia after menopause 05/29/2019  . Over weight 05/29/2019  . Elevated blood pressure reading 05/29/2019  . Immunization due 08/03/2017  . Left leg swelling 05/04/2017  .  Allergic rhinitis 10/11/2015  . Hypothyroidism 12/10/2013  . Peripheral neuropathy 05/16/2013    Mariah Milling, OTR/L 03/17/2021, 3:12 PM  [facility] [address] [place], [place], [address] Phone: [phone]   Fax:  [phone]  Name: Krystal Moore MRN: [id number] Date of Birth: 07-Aug-1930

## 2021-03-22 ENCOUNTER — Encounter: Payer: Self-pay | Admitting: Occupational Therapy

## 2021-03-22 ENCOUNTER — Other Ambulatory Visit: Payer: Self-pay

## 2021-03-22 ENCOUNTER — Ambulatory Visit: Payer: PPO | Admitting: Occupational Therapy

## 2021-03-22 DIAGNOSIS — R208 Other disturbances of skin sensation: Secondary | ICD-10-CM

## 2021-03-22 DIAGNOSIS — M25631 Stiffness of right wrist, not elsewhere classified: Secondary | ICD-10-CM

## 2021-03-22 DIAGNOSIS — M25641 Stiffness of right hand, not elsewhere classified: Secondary | ICD-10-CM | POA: Diagnosis not present

## 2021-03-22 DIAGNOSIS — R600 Localized edema: Secondary | ICD-10-CM

## 2021-03-22 DIAGNOSIS — M6281 Muscle weakness (generalized): Secondary | ICD-10-CM

## 2021-03-22 NOTE — Therapy (Signed)
[facility] [address] [place], [place], [address] Phone: [phone]   Fax:  [phone]  Occupational Therapy Treatment  Patient Details  Name: Krystal Moore MRN: [id number] Date of Birth: 1930-08-17 Referring Provider (OT): [name], [place]   Encounter Date: 03/22/2021   OT End of Session - 03/22/21 1257    Visit Number 16    Number of Visits 31    Date for OT Re-Evaluation 05/16/21    Authorization Type HTA No auth - VL:MN    Authorization - Visit Number 15    Progress Note Due on Visit 20    OT Start Time 1240    OT Stop Time 1315    OT Time Calculation (min) 35 min           Past Medical History:  Diagnosis Date  . Anemia   . Narcolepsy   . Neuropathy   . Obesity   . Pneumonia   . Polyneuropathy in other diseases classified elsewhere ([place])   . Shoulder fracture, left     Past Surgical History:  Procedure Laterality Date  . CATARACT EXTRACTION Left 06/2019  . MOUTH SURGERY    . NASAL SINUS SURGERY      There were no vitals filed for this visit.        Fluidotherapy x 10 mins to RUE for pain and stiffness, no adverse reactions.  A/ROM, P/ROM wrist flexion/ extension followed by wrist flexion extension and ulnar radial deviation with 1 lbs weight 10 reps each  Joint mobs at MP's   Digi- extend x 10 reps then removing graded clothespins from vertical antenna for sustained tip and 3 pt pinch, min difficulty  Fine motor coordination to place grooved pegs in pegboard and then to remove with with tip pinch, pt demonstrates improved coordination.  Therapist reviewed updated goals with patient.                    OT Short Term Goals - 03/17/21 1509      OT SHORT TERM GOAL #1   Title Patient will demonstrate effective edema management techniques due 03/05/21    Time 4    Period Weeks    Status Achieved   edema glove issued however pt using inconsistently    Target Date 03/05/21      OT SHORT TERM GOAL #2   Title Patient will complete a home exercise program designed to improve active and passive range of motion in forearm, wrist, and digits    Time 4    Period Weeks    Status Achieved      OT SHORT TERM GOAL #3   Title Patient will demonstrate ability to contact pads 2-5  of fingers to palm to help facilitate gross grasp    Time 4    Period Weeks    Status Achieved      OT SHORT TERM GOAL #4   Title Patient will demonstrate ability to oppose thumb to 4th and 5th digit tips with min assist    Time 4    Period Weeks    Status Achieved   mod I     OT SHORT TERM GOAL #5   Title Patient will demonstrate 12 lb grip strength in RUE DUE 6/25    Time 4    Period Weeks    Status New    Target Date 04/16/21             OT Long  Term Goals - 03/17/21 1437      OT LONG TERM GOAL #1   Title Patient will complete HEP designed to improve functional strength and use of dominant RUE DUE 6/13    Time 8    Period Weeks    Status On-going      OT LONG TERM GOAL #2   Title Patient will demonstrate 90% composite flexion and extension right hand    Time 8    Period Weeks    Status Achieved      OT LONG TERM GOAL #3   Title Patient will demonstrate 10 lb increase in grip strength in right hand to help with functional grip and carry    Time 8    Period Weeks    Status Achieved      OT LONG TERM GOAL #4   Title Patient will demonstrate sufficient stamina and strength/range of motion in right hand 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    Time 8    Period Weeks    Status Achieved      OT LONG TERM GOAL #5   Title Patient will demonstrate at least 5 lb increase in pinch strength in right hand to aide with opening packages, holding small items    Time 8    Period Weeks    Status Achieved      Long Term Additional Goals   Additional Long Term Goals Yes      OT LONG TERM GOAL #6   Title Patient will demonstrate improved  wrist and forearm strength as evidenced by her ability to water plants with RUE without support of left hand due 7/25    Time 8    Period Weeks    Status New    Target Date 05/16/21      OT LONG TERM GOAL #7   Title Patient will self report less discomfort and digit recoil when using power strike on keys in piano piece.    Time 8    Period Weeks    Status New      OT LONG TERM GOAL #8   Title Patient will demonstrate improved pincer grasp and bimanual skills to be able to zip choir robe in reasonable timeframe.    Time 8    Period Weeks    Status New      OT LONG TERM GOAL  #9   TITLE Patient will improve grip strength to at least 20 lbs RUE    Time 8    Period Weeks    Status New      OT LONG TERM GOAL  #10   TITLE Patient will report improved speed and flexibility with complicated piano piece Loetta Rough or [place]- per patient)    Time 8    Period Weeks    Status New                 Plan - 03/22/21 1258    Clinical Impression Statement Pt is progressing towards goals. She demonstrates improved RUE ROM and functional use.    OT Occupational Profile and History Problem Focused Assessment - Including review of records relating to presenting problem    Occupational performance deficits (Please refer to evaluation for details): ADL's;IADL's;Work;Leisure    Body Structure / Function / Physical Skills ADL;Coordination;UE functional use;Sensation;Fascial restriction;Decreased knowledge of precautions;Decreased knowledge of use of DME;Flexibility;IADL;Pain;Skin integrity;Strength;FMC;Dexterity;Edema;Mobility;ROM    Clinical Decision Making Limited treatment options, no task modification necessary    Comorbidities Affecting Occupational  Performance: None    Modification or Assistance to Complete Evaluation  No modification of tasks or assist necessary to complete eval    OT Frequency 2x / week    OT Duration 8 weeks    OT Treatment/Interventions Self-care/ADL training;Therapeutic  exercise;Patient/family education;Splinting;Compression bandaging;Moist Heat;Paraffin;Fluidtherapy;Therapeutic activities;Cryotherapy;Ultrasound;Contrast Bath;DME and/or AE instruction;Manual Therapy;Passive range of motion    Plan continue fluidotherapy or paraffin, putty, A/ROM and P/ROM to hand,  functional use -    Consulted and Agree with Plan of Care Patient           Patient will benefit from skilled therapeutic intervention in order to improve the following deficits and impairments:   Body Structure / Function / Physical Skills: ADL,Coordination,UE functional use,Sensation,Fascial restriction,Decreased knowledge of precautions,Decreased knowledge of use of DME,Flexibility,IADL,Pain,Skin integrity,Strength,FMC,Dexterity,Edema,Mobility,ROM       Visit Diagnosis: Stiffness of right hand, not elsewhere classified  Stiffness of right wrist, not elsewhere classified  Other disturbances of skin sensation  Muscle weakness (generalized)  Localized edema    Problem List Patient Active Problem List   Diagnosis Date Noted  . Numbness 07/30/2019  . Spinal stenosis of lumbar region 07/30/2019  . Osteopenia after menopause 05/29/2019  . Over weight 05/29/2019  . Elevated blood pressure reading 05/29/2019  . Immunization due 08/03/2017  . Left leg swelling 05/04/2017  . Allergic rhinitis 10/11/2015  . Hypothyroidism 12/10/2013  . Peripheral neuropathy 05/16/2013    [facility] [place] 03/22/2021, 1:14 PM  [facility] [address] [place], [place], [address] Phone: [phone]   Fax:  [phone]  Name: Krystal Moore MRN: [id number] Date of Birth: 1929/12/28

## 2021-03-23 ENCOUNTER — Other Ambulatory Visit: Payer: Self-pay | Admitting: Internal Medicine

## 2021-03-23 DIAGNOSIS — R600 Localized edema: Secondary | ICD-10-CM

## 2021-03-23 NOTE — Telephone Encounter (Signed)
Requested medications are due for refill today.  Unknown  Requested medications are on the active medications list.  no  Last refill. unknown  Future visit scheduled.   no  Notes to clinic.  Per note this medication was discontinued on 5/10/222 by Lendon Collar. Please advise.

## 2021-03-24 ENCOUNTER — Other Ambulatory Visit: Payer: Self-pay

## 2021-03-24 ENCOUNTER — Ambulatory Visit: Payer: PPO | Attending: Physician Assistant | Admitting: Occupational Therapy

## 2021-03-24 ENCOUNTER — Encounter: Payer: Self-pay | Admitting: Occupational Therapy

## 2021-03-24 DIAGNOSIS — M25641 Stiffness of right hand, not elsewhere classified: Secondary | ICD-10-CM | POA: Insufficient documentation

## 2021-03-24 DIAGNOSIS — M6281 Muscle weakness (generalized): Secondary | ICD-10-CM | POA: Diagnosis not present

## 2021-03-24 DIAGNOSIS — M25631 Stiffness of right wrist, not elsewhere classified: Secondary | ICD-10-CM | POA: Diagnosis not present

## 2021-03-24 DIAGNOSIS — R208 Other disturbances of skin sensation: Secondary | ICD-10-CM | POA: Diagnosis not present

## 2021-03-24 DIAGNOSIS — R600 Localized edema: Secondary | ICD-10-CM | POA: Insufficient documentation

## 2021-03-24 NOTE — Therapy (Signed)
[facility] [address] [place], [place], [address] Phone: [phone]   Fax:  [phone]  Occupational Therapy Treatment  Patient Details  Name: Krystal Moore MRN: [id number] Date of Birth: Nov 16, 1929 Referring Provider (OT): [name], [place]   Encounter Date: 03/24/2021   OT End of Session - 03/24/21 1524    Visit Number 17    Number of Visits 31    Date for OT Re-Evaluation 05/16/21    Authorization Type HTA No auth - VL:MN    Authorization - Visit Number 16    Progress Note Due on Visit 20    OT Start Time 1230    OT Stop Time 1315    OT Time Calculation (min) 45 min    Activity Tolerance Patient tolerated treatment well    Behavior During Therapy [facility] for tasks assessed/performed           Past Medical History:  Diagnosis Date  . Anemia   . Narcolepsy   . Neuropathy   . Obesity   . Pneumonia   . Polyneuropathy in other diseases classified elsewhere ([place])   . Shoulder fracture, left     Past Surgical History:  Procedure Laterality Date  . CATARACT EXTRACTION Left 06/2019  . MOUTH SURGERY    . NASAL SINUS SURGERY      There were no vitals filed for this visit.   Subjective Assessment - 03/24/21 1240    Subjective  I was able to practice for an 1 1/2 hour - not working on my most difficult pieces, but working on [employer].  Reporting better speed with piano practice    Pertinent History Pt sustained a Comminuted distal radial fracture with extension to the radiocarpal  joint on 11/28/20, pt was treated no-operatively    Currently in Pain? Yes    Pain Score 2     Pain Location Finger (Comment which one)    Pain Orientation Right    Pain Descriptors / Indicators Shooting    Pain Type Chronic pain    Pain Onset More than a month ago    Pain Frequency Intermittent    Aggravating Factors  touch    Pain Relieving Factors heat                        OT  Treatments/Exercises (OP) - 03/24/21 0001      RUE Fluidotherapy   Number Minutes Fluidotherapy 12 Minutes    RUE Fluidotherapy Location Hand;Wrist;Forearm    Comments Exercise hand, wrist in heat      Manual Therapy   Joint Mobilization Index and long finger MCP/PIP/DIP then sustained stretch with composite flexion of these two.                    OT Short Term Goals - 03/24/21 1526      OT SHORT TERM GOAL #1   Title Patient will demonstrate effective edema management techniques due 03/05/21    Time 4    Period Weeks    Status Achieved   edema glove issued however pt using inconsistently   Target Date 03/05/21      OT SHORT TERM GOAL #2   Title Patient will complete a home exercise program designed to improve active and passive range of motion in forearm, wrist, and digits    Time 4    Period Weeks    Status Achieved  OT SHORT TERM GOAL #3   Title Patient will demonstrate ability to contact pads 2-5  of fingers to palm to help facilitate gross grasp    Time 4    Period Weeks    Status Achieved      OT SHORT TERM GOAL #4   Title Patient will demonstrate ability to oppose thumb to 4th and 5th digit tips with min assist    Time 4    Period Weeks    Status Achieved   mod I     OT SHORT TERM GOAL #5   Title Patient will demonstrate 12 lb grip strength in RUE DUE 6/25    Time 4    Period Weeks    Status On-going    Target Date 04/16/21             OT Long Term Goals - 03/24/21 1527      OT LONG TERM GOAL #1   Title Patient will complete HEP designed to improve functional strength and use of dominant RUE DUE 6/13    Time 8    Period Weeks    Status On-going      OT LONG TERM GOAL #2   Title Patient will demonstrate 90% composite flexion and extension right hand    Time 8    Period Weeks    Status Achieved      OT LONG TERM GOAL #3   Title Patient will demonstrate 10 lb increase in grip strength in right hand to help with functional grip and  carry    Time 8    Period Weeks    Status Achieved      OT LONG TERM GOAL #4   Title Patient will demonstrate sufficient stamina and strength/range of motion in right hand 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    Time 8    Period Weeks    Status Achieved      OT LONG TERM GOAL #5   Title Patient will demonstrate at least 5 lb increase in pinch strength in right hand to aide with opening packages, holding small items    Time 8    Period Weeks    Status Achieved      OT LONG TERM GOAL #6   Title Patient will demonstrate improved wrist and forearm strength as evidenced by her ability to water plants with RUE without support of left hand due 7/25    Time 8    Period Weeks    Status On-going      OT LONG TERM GOAL #7   Title Patient will self report less discomfort and digit recoil when using power strike on keys in piano piece.    Time 8    Period Weeks    Status On-going      OT LONG TERM GOAL #8   Title Patient will demonstrate improved pincer grasp and bimanual skills to be able to zip choir robe in reasonable timeframe.    Time 8    Period Weeks    Status On-going      OT LONG TERM GOAL  #9   TITLE Patient will improve grip strength to at least 20 lbs RUE    Time 8    Period Weeks    Status On-going      OT LONG TERM GOAL  #10   TITLE Patient will report improved speed and flexibility with complicated piano piece Loetta Rough or [place]- per patient)    Time  8    Period Weeks    Status On-going                 Plan - 03/24/21 1524    Clinical Impression Statement Pt continues to improve with range of motion and functional use of RUE    OT Occupational Profile and History Problem Focused Assessment - Including review of records relating to presenting problem    Occupational performance deficits (Please refer to evaluation for details): ADL's;IADL's;Work;Leisure    Body Structure / Function / Physical Skills ADL;Coordination;UE functional  use;Sensation;Fascial restriction;Decreased knowledge of precautions;Decreased knowledge of use of DME;Flexibility;IADL;Pain;Skin integrity;Strength;FMC;Dexterity;Edema;Mobility;ROM    Clinical Decision Making Limited treatment options, no task modification necessary    Comorbidities Affecting Occupational Performance: None    Modification or Assistance to Complete Evaluation  No modification of tasks or assist necessary to complete eval    OT Frequency 2x / week    OT Duration 8 weeks    OT Treatment/Interventions Self-care/ADL training;Therapeutic exercise;Patient/family education;Splinting;Compression bandaging;Moist Heat;Paraffin;Fluidtherapy;Therapeutic activities;Cryotherapy;Ultrasound;Contrast Bath;DME and/or AE instruction;Manual Therapy;Passive range of motion    Plan continue fluidotherapy or paraffin, putty, A/ROM and P/ROM to hand,  functional use -    Consulted and Agree with Plan of Care Patient           Patient will benefit from skilled therapeutic intervention in order to improve the following deficits and impairments:   Body Structure / Function / Physical Skills: ADL,Coordination,UE functional use,Sensation,Fascial restriction,Decreased knowledge of precautions,Decreased knowledge of use of DME,Flexibility,IADL,Pain,Skin integrity,Strength,FMC,Dexterity,Edema,Mobility,ROM       Visit Diagnosis: Stiffness of right hand, not elsewhere classified  Stiffness of right wrist, not elsewhere classified  Other disturbances of skin sensation  Muscle weakness (generalized)  Localized edema    Problem List Patient Active Problem List   Diagnosis Date Noted  . Numbness 07/30/2019  . Spinal stenosis of lumbar region 07/30/2019  . Osteopenia after menopause 05/29/2019  . Over weight 05/29/2019  . Elevated blood pressure reading 05/29/2019  . Immunization due 08/03/2017  . Left leg swelling 05/04/2017  . Allergic rhinitis 10/11/2015  . Hypothyroidism 12/10/2013  .  Peripheral neuropathy 05/16/2013    Krystal Moore, OTR/L 03/24/2021, 3:29 PM  [facility] [address] [place], [place], [address] Phone: [phone]   Fax:  [phone]  Name: Krystal Moore MRN: [id number] Date of Birth: 28-Jul-1930

## 2021-03-25 ENCOUNTER — Other Ambulatory Visit: Payer: Self-pay | Admitting: Internal Medicine

## 2021-03-25 DIAGNOSIS — R600 Localized edema: Secondary | ICD-10-CM

## 2021-03-25 NOTE — Telephone Encounter (Signed)
  Notes to clinic:  requesting a 90 day supply    Requested Prescriptions  Pending Prescriptions Disp Refills   hydrochlorothiazide (HYDRODIURIL) 12.5 MG tablet [Pharmacy Med Name: HYDROCHLOROTHIAZIDE 12.5MG  TABLETS] 90 tablet     Sig: TAKE 1 TABLET BY MOUTH DAILY AS NEEDED FOR LOWER EXTREMITY SWELLING      Cardiovascular: Diuretics - Thiazide Failed - 03/25/2021  8:52 AM      Failed - Last BP in normal range    BP Readings from Last 1 Encounters:  02/09/21 (!) 143/80          Passed - Ca in normal range and within 360 days    Calcium  Date Value Ref Range Status  02/09/2021 10.1 8.7 - 10.3 mg/dL Final          Passed - Cr in normal range and within 360 days    Creatinine, Ser  Date Value Ref Range Status  02/09/2021 0.88 0.57 - 1.00 mg/dL Final          Passed - K in normal range and within 360 days    Potassium  Date Value Ref Range Status  02/09/2021 3.8 3.5 - 5.2 mmol/L Final          Passed - Na in normal range and within 360 days    Sodium  Date Value Ref Range Status  02/09/2021 135 134 - 144 mmol/L Final          Passed - Valid encounter within last 6 months    Recent Outpatient Visits           1 month ago Hypothyroidism, unspecified type   [place], [place], MD   3 months ago Hospital discharge follow-up   [place], Zelda W, NP   1 year ago Acute pain of right shoulder   [place], MD   1 year ago Elevated blood pressure reading   [place], RPH-CPP   1 year ago Over weight   [place], MD

## 2021-03-29 ENCOUNTER — Other Ambulatory Visit: Payer: Self-pay

## 2021-03-29 ENCOUNTER — Ambulatory Visit: Payer: PPO | Admitting: Occupational Therapy

## 2021-03-29 DIAGNOSIS — M25641 Stiffness of right hand, not elsewhere classified: Secondary | ICD-10-CM | POA: Diagnosis not present

## 2021-03-29 DIAGNOSIS — M25631 Stiffness of right wrist, not elsewhere classified: Secondary | ICD-10-CM

## 2021-03-29 DIAGNOSIS — M6281 Muscle weakness (generalized): Secondary | ICD-10-CM

## 2021-03-29 DIAGNOSIS — R208 Other disturbances of skin sensation: Secondary | ICD-10-CM

## 2021-03-29 NOTE — Therapy (Signed)
[facility] [address] [place], [place], [address] Phone: [phone]   Fax:  [phone]  Occupational Therapy Treatment  Patient Details  Name: Krystal Moore MRN: [id number] Date of Birth: October 28, 1929 Referring Provider (OT): [name], [place]   Encounter Date: 03/29/2021   OT End of Session - 03/29/21 1400    Visit Number 18    Number of Visits 31    Date for OT Re-Evaluation 05/16/21    Authorization Type HTA No auth - VL:MN    Authorization - Visit Number 17    Progress Note Due on Visit 20    OT Start Time 1320    OT Stop Time 1400    OT Time Calculation (min) 40 min           Past Medical History:  Diagnosis Date  . Anemia   . Narcolepsy   . Neuropathy   . Obesity   . Pneumonia   . Polyneuropathy in other diseases classified elsewhere ([facility])   . Shoulder fracture, left     Past Surgical History:  Procedure Laterality Date  . CATARACT EXTRACTION Left 06/2019  . MOUTH SURGERY    . NASAL SINUS SURGERY      There were no vitals filed for this visit.   Subjective Assessment - 03/29/21 1346    Subjective  Pt reports that she can stop focusing on the pain while playing the piano    Pertinent History Pt sustained a Comminuted distal radial fracture with extension to the radiocarpal  joint on 11/28/20, pt was treated no-operatively    Currently in Pain? Yes    Pain Score 5     Pain Location Hand    Pain Orientation Right    Pain Descriptors / Indicators Aching    Pain Type Chronic pain    Pain Onset More than a month ago    Pain Frequency Intermittent    Aggravating Factors  touch    Pain Relieving Factors heat                   Treatment Fluidotherapy x 10 mins to RUE for pain and stiffness, no adverse reactions.  A/ROM, P/ROM wrist flexion/ extension followed by wrist flexion extension  with 1 lbs weight 10 reps each,  Joint mobs at MP joints of right hand  Putty  exercises for composite grip, thumb flexion and tip pinch with red putty, min v.c   Digi- extend x 10 reps for resisted extension in prep for stretch during playing piano  Grooved pegs for RUE fine motor coordination and in hand manipulation, min difficulty/ v.c   Pt reports improved overall functional use of her RUE but she is still bothered by pain at night. Therapist encouraged pt to discuss with her MD.                OT Short Term Goals - 03/24/21 1526      OT SHORT TERM GOAL #1   Title Patient will demonstrate effective edema management techniques due 03/05/21    Time 4    Period Weeks    Status Achieved   edema glove issued however pt using inconsistently   Target Date 03/05/21      OT SHORT TERM GOAL #2   Title Patient will complete a home exercise program designed to improve active and passive range of motion in forearm, wrist, and digits    Time 4    Period Weeks  Status Achieved      OT SHORT TERM GOAL #3   Title Patient will demonstrate ability to contact pads 2-5  of fingers to palm to help facilitate gross grasp    Time 4    Period Weeks    Status Achieved      OT SHORT TERM GOAL #4   Title Patient will demonstrate ability to oppose thumb to 4th and 5th digit tips with min assist    Time 4    Period Weeks    Status Achieved   mod I     OT SHORT TERM GOAL #5   Title Patient will demonstrate 12 lb grip strength in RUE DUE 6/25    Time 4    Period Weeks    Status On-going    Target Date 04/16/21             OT Long Term Goals - 03/24/21 1527      OT LONG TERM GOAL #1   Title Patient will complete HEP designed to improve functional strength and use of dominant RUE DUE 6/13    Time 8    Period Weeks    Status On-going      OT LONG TERM GOAL #2   Title Patient will demonstrate 90% composite flexion and extension right hand    Time 8    Period Weeks    Status Achieved      OT LONG TERM GOAL #3   Title Patient will demonstrate 10 lb  increase in grip strength in right hand to help with functional grip and carry    Time 8    Period Weeks    Status Achieved      OT LONG TERM GOAL #4   Title Patient will demonstrate sufficient stamina and strength/range of motion in right hand 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    Time 8    Period Weeks    Status Achieved      OT LONG TERM GOAL #5   Title Patient will demonstrate at least 5 lb increase in pinch strength in right hand to aide with opening packages, holding small items    Time 8    Period Weeks    Status Achieved      OT LONG TERM GOAL #6   Title Patient will demonstrate improved wrist and forearm strength as evidenced by her ability to water plants with RUE without support of left hand due 7/25    Time 8    Period Weeks    Status On-going      OT LONG TERM GOAL #7   Title Patient will self report less discomfort and digit recoil when using power strike on keys in piano piece.    Time 8    Period Weeks    Status On-going      OT LONG TERM GOAL #8   Title Patient will demonstrate improved pincer grasp and bimanual skills to be able to zip choir robe in reasonable timeframe.    Time 8    Period Weeks    Status On-going      OT LONG TERM GOAL  #9   TITLE Patient will improve grip strength to at least 20 lbs RUE    Time 8    Period Weeks    Status On-going      OT LONG TERM GOAL  #10   TITLE Patient will report improved speed and flexibility with complicated piano piece Loetta Rough or  Chopin- per patient)    Time 8    Period Weeks    Status On-going                 Plan - 03/29/21 1407    Clinical Impression Statement Pt continues to progress towards goals with improving RUE functional use.    OT Occupational Profile and History Problem Focused Assessment - Including review of records relating to presenting problem    Occupational performance deficits (Please refer to evaluation for details): ADL's;IADL's;Work;Leisure    Body  Structure / Function / Physical Skills ADL;Coordination;UE functional use;Sensation;Fascial restriction;Decreased knowledge of precautions;Decreased knowledge of use of DME;Flexibility;IADL;Pain;Skin integrity;Strength;FMC;Dexterity;Edema;Mobility;ROM    Clinical Decision Making Limited treatment options, no task modification necessary    Comorbidities Affecting Occupational Performance: None    Modification or Assistance to Complete Evaluation  No modification of tasks or assist necessary to complete eval    OT Frequency 2x / week    OT Duration 8 weeks    OT Treatment/Interventions Self-care/ADL training;Therapeutic exercise;Patient/family education;Splinting;Compression bandaging;Moist Heat;Paraffin;Fluidtherapy;Therapeutic activities;Cryotherapy;Ultrasound;Contrast Bath;DME and/or AE instruction;Manual Therapy;Passive range of motion    Plan continue fluidotherapy or paraffin, putty, A/ROM and P/ROM to hand,  functional use -    Consulted and Agree with Plan of Care Patient           Patient will benefit from skilled therapeutic intervention in order to improve the following deficits and impairments:   Body Structure / Function / Physical Skills: ADL,Coordination,UE functional use,Sensation,Fascial restriction,Decreased knowledge of precautions,Decreased knowledge of use of DME,Flexibility,IADL,Pain,Skin integrity,Strength,FMC,Dexterity,Edema,Mobility,ROM       Visit Diagnosis: Stiffness of right hand, not elsewhere classified  Stiffness of right wrist, not elsewhere classified  Other disturbances of skin sensation  Muscle weakness (generalized)    Problem List Patient Active Problem List   Diagnosis Date Noted  . Numbness 07/30/2019  . Spinal stenosis of lumbar region 07/30/2019  . Osteopenia after menopause 05/29/2019  . Over weight 05/29/2019  . Elevated blood pressure reading 05/29/2019  . Immunization due 08/03/2017  . Left leg swelling 05/04/2017  . Allergic  rhinitis 10/11/2015  . Hypothyroidism 12/10/2013  . Peripheral neuropathy 05/16/2013    Alencia Gordon 03/29/2021, 2:09 PM  [facility] [address] [place], [place], [address] Phone: [phone]   Fax:  [phone]  Name: Pari Lombard MRN: [id number] Date of Birth: 1930/08/07

## 2021-03-31 ENCOUNTER — Encounter: Payer: Self-pay | Admitting: Occupational Therapy

## 2021-03-31 ENCOUNTER — Other Ambulatory Visit: Payer: Self-pay

## 2021-03-31 ENCOUNTER — Ambulatory Visit: Payer: PPO | Admitting: Occupational Therapy

## 2021-03-31 DIAGNOSIS — M6281 Muscle weakness (generalized): Secondary | ICD-10-CM

## 2021-03-31 DIAGNOSIS — M25641 Stiffness of right hand, not elsewhere classified: Secondary | ICD-10-CM | POA: Diagnosis not present

## 2021-03-31 DIAGNOSIS — R208 Other disturbances of skin sensation: Secondary | ICD-10-CM

## 2021-03-31 DIAGNOSIS — R600 Localized edema: Secondary | ICD-10-CM

## 2021-03-31 DIAGNOSIS — M25631 Stiffness of right wrist, not elsewhere classified: Secondary | ICD-10-CM

## 2021-03-31 NOTE — Therapy (Signed)
[facility] [address] [place], [place], [address] Phone: [phone]   Fax:  [phone]  Occupational Therapy Treatment  Patient Details  Name: Krystal Moore MRN: [id number] Date of Birth: 1930/04/03 Referring Provider (OT): [name], [place]   Encounter Date: 03/31/2021   OT End of Session - 03/31/21 1456     Visit Number 19    Number of Visits 31    Date for OT Re-Evaluation 05/16/21    Authorization Type HTA No auth - VL:MN    Authorization - Visit Number 18    Progress Note Due on Visit 20    OT Start Time 1333    OT Stop Time 1400    OT Time Calculation (min) 27 min    Equipment Utilized During Treatment fluidotherapy    Activity Tolerance Patient tolerated treatment well    Behavior During Therapy [facility] for tasks assessed/performed             Past Medical History:  Diagnosis Date   Anemia    Narcolepsy    Neuropathy    Obesity    Pneumonia    Polyneuropathy in other diseases classified elsewhere [facility])    Shoulder fracture, left     Past Surgical History:  Procedure Laterality Date   CATARACT EXTRACTION Left 06/2019   MOUTH SURGERY     NASAL SINUS SURGERY      There were no vitals filed for this visit.   Subjective Assessment - 03/31/21 1338     Subjective  Patient reports that she has had significant increase in pain    Pertinent History Pt sustained a Comminuted distal radial fracture with extension to the radiocarpal  joint on 11/28/20, pt was treated non-operatively    Currently in Pain? Yes    Pain Score 10-Worst pain ever    Pain Location Hand    Pain Orientation Right    Pain Descriptors / Indicators Sharp;Other (Comment)   electric- like needles   Pain Type Acute pain    Pain Onset In the past 7 days    Pain Frequency Constant    Aggravating Factors  unsure    Pain Relieving Factors heat                [facility] OT Assessment - 03/31/21 0001       Right  Hand AROM   R Index  MCP 0-90 80 Degrees    R Index PIP 0-100 97 Degrees    R Long  MCP 0-90 85 Degrees                      OT Treatments/Exercises (OP) - 03/31/21 0001       ADLs   ADL Comments Patient concerned regarding pain in right hand.  Discussed that we were working to reduce stiffness that has set in , and trying to prevent long term stiffness.  Expect that this will be uncomfortable at times.  Patient needed to take a pain pill prior to this session, but felt immediate results with heat and stretch.  Patient has nerve conduction study next month - still describing nerve pain.      RUE Fluidotherapy   Number Minutes Fluidotherapy 8 Minutes    RUE Fluidotherapy Location Hand;Wrist;Forearm    Comments Exercise hand in heat for pain relief      Manual Therapy   Joint Mobilization Index and long finger MCP/PIP/DIP then sustained stretch with composite flexion of these  two.                      OT Short Term Goals - 03/31/21 1458       OT SHORT TERM GOAL #1   Title Patient will demonstrate effective edema management techniques due 03/05/21    Time 4    Period Weeks    Status Achieved   edema glove issued however pt using inconsistently   Target Date 03/05/21      OT SHORT TERM GOAL #2   Title Patient will complete a home exercise program designed to improve active and passive range of motion in forearm, wrist, and digits    Time 4    Period Weeks    Status Achieved      OT SHORT TERM GOAL #3   Title Patient will demonstrate ability to contact pads 2-5  of fingers to palm to help facilitate gross grasp    Time 4    Period Weeks    Status Achieved      OT SHORT TERM GOAL #4   Title Patient will demonstrate ability to oppose thumb to 4th and 5th digit tips with min assist    Time 4    Period Weeks    Status Achieved   mod I     OT SHORT TERM GOAL #5   Title Patient will demonstrate 12 lb grip strength in RUE DUE 6/25    Time 4    Period  Weeks    Status On-going    Target Date 04/16/21               OT Long Term Goals - 03/31/21 1458       OT LONG TERM GOAL #1   Title Patient will complete HEP designed to improve functional strength and use of dominant RUE DUE 6/13    Time 8    Period Weeks    Status On-going      OT LONG TERM GOAL #2   Title Patient will demonstrate 90% composite flexion and extension right hand    Time 8    Period Weeks    Status Achieved      OT LONG TERM GOAL #3   Title Patient will demonstrate 10 lb increase in grip strength in right hand to help with functional grip and carry    Time 8    Period Weeks    Status Achieved      OT LONG TERM GOAL #4   Title Patient will demonstrate sufficient stamina and strength/range of motion in right hand 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    Time 8    Period Weeks    Status Achieved      OT LONG TERM GOAL #5   Title Patient will demonstrate at least 5 lb increase in pinch strength in right hand to aide with opening packages, holding small items    Time 8    Period Weeks    Status Achieved      OT LONG TERM GOAL #6   Title Patient will demonstrate improved wrist and forearm strength as evidenced by her ability to water plants with RUE without support of left hand due 7/25    Time 8    Period Weeks    Status On-going      OT LONG TERM GOAL #7   Title Patient will self report less discomfort and digit recoil when using power strike on keys in  piano piece.    Time 8    Period Weeks    Status On-going      OT LONG TERM GOAL #8   Title Patient will demonstrate improved pincer grasp and bimanual skills to be able to zip choir robe in reasonable timeframe.    Time 8    Period Weeks    Status On-going      OT LONG TERM GOAL  #9   TITLE Patient will improve grip strength to at least 20 lbs RUE    Time 8    Period Weeks    Status On-going      OT LONG TERM GOAL  #10   TITLE Patient will report improved speed and  flexibility with complicated piano piece Loetta Rough or [place]- per patient)    Time 8    Period Weeks    Status On-going                   Plan - 03/31/21 1457     Clinical Impression Statement Pt expressing frustration at pain in right hand - specifically thumb, index and long finger - describes nerve pain.  Patient continues to report improved ability to play piano/organ.    OT Occupational Profile and History Problem Focused Assessment - Including review of records relating to presenting problem    Occupational performance deficits (Please refer to evaluation for details): ADL's;IADL's;Work;Leisure    Body Structure / Function / Physical Skills ADL;Coordination;UE functional use;Sensation;Fascial restriction;Decreased knowledge of precautions;Decreased knowledge of use of DME;Flexibility;IADL;Pain;Skin integrity;Strength;FMC;Dexterity;Edema;Mobility;ROM    Clinical Decision Making Limited treatment options, no task modification necessary    Comorbidities Affecting Occupational Performance: None    Modification or Assistance to Complete Evaluation  No modification of tasks or assist necessary to complete eval    OT Frequency 2x / week    OT Duration 8 weeks    OT Treatment/Interventions Self-care/ADL training;Therapeutic exercise;Patient/family education;Splinting;Compression bandaging;Moist Heat;Paraffin;Fluidtherapy;Therapeutic activities;Cryotherapy;Ultrasound;Contrast Bath;DME and/or AE instruction;Manual Therapy;Passive range of motion    Plan continue fluidotherapy or paraffin, putty, A/ROM and P/ROM to hand,  functional use -    Consulted and Agree with Plan of Care Patient             Patient will benefit from skilled therapeutic intervention in order to improve the following deficits and impairments:   Body Structure / Function / Physical Skills: ADL, Coordination, UE functional use, Sensation, Fascial restriction, Decreased knowledge of precautions, Decreased knowledge of  use of DME, Flexibility, IADL, Pain, Skin integrity, Strength, FMC, Dexterity, Edema, Mobility, ROM       Visit Diagnosis: Stiffness of right hand, not elsewhere classified  Stiffness of right wrist, not elsewhere classified  Other disturbances of skin sensation  Muscle weakness (generalized)  Localized edema    Problem List Patient Active Problem List   Diagnosis Date Noted   Numbness 07/30/2019   Spinal stenosis of lumbar region 07/30/2019   Osteopenia after menopause 05/29/2019   Over weight 05/29/2019   Elevated blood pressure reading 05/29/2019   Immunization due 08/03/2017   Left leg swelling 05/04/2017   Allergic rhinitis 10/11/2015   Hypothyroidism 12/10/2013   Peripheral neuropathy 05/16/2013    Krystal Moore 03/31/2021, 2:59 PM  [facility] [address] [place], [place], [address] Phone: [phone]   Fax:  [phone]  Name: Krystal Moore MRN: [id number] Date of Birth: [birth year]

## 2021-04-05 ENCOUNTER — Encounter: Payer: PPO | Admitting: Occupational Therapy

## 2021-04-07 ENCOUNTER — Other Ambulatory Visit: Payer: Self-pay

## 2021-04-07 ENCOUNTER — Ambulatory Visit: Payer: PPO | Admitting: Occupational Therapy

## 2021-04-07 ENCOUNTER — Encounter: Payer: Self-pay | Admitting: Occupational Therapy

## 2021-04-07 DIAGNOSIS — R208 Other disturbances of skin sensation: Secondary | ICD-10-CM

## 2021-04-07 DIAGNOSIS — M6281 Muscle weakness (generalized): Secondary | ICD-10-CM

## 2021-04-07 DIAGNOSIS — M25641 Stiffness of right hand, not elsewhere classified: Secondary | ICD-10-CM

## 2021-04-07 DIAGNOSIS — M25631 Stiffness of right wrist, not elsewhere classified: Secondary | ICD-10-CM

## 2021-04-07 DIAGNOSIS — R600 Localized edema: Secondary | ICD-10-CM

## 2021-04-07 NOTE — Therapy (Signed)
[facility] [address] [place], [place], [address] Phone: [phone]   Fax:  [phone]  Occupational Therapy Treatment and Progress Report  Patient Details  Name: Krystal Moore MRN: [id number] Date of Birth: 04/10/1930 Referring Provider (OT): [name], [place] Covers service dates 03/01/21-04/07/21  Encounter Date: 04/07/2021   OT End of Session - 04/07/21 1442     Visit Number 20    Number of Visits 31    Date for OT Re-Evaluation 05/16/21    Authorization Type HTA No auth - VL:[id number]    Authorization - Visit Number 19    Progress Note Due on Visit 20    OT Start Time 1230    OT Stop Time 1315    OT Time Calculation (min) 45 min    Equipment Utilized During Treatment fluidotherapy, Ultrasound    Activity Tolerance Patient tolerated treatment well    Behavior During Therapy [facility] for tasks assessed/performed             Past Medical History:  Diagnosis Date   Anemia    Narcolepsy    Neuropathy    Obesity    Pneumonia    Polyneuropathy in other diseases classified elsewhere [facility])    Shoulder fracture, left     Past Surgical History:  Procedure Laterality Date   CATARACT EXTRACTION Left 06/2019   MOUTH SURGERY     NASAL SINUS SURGERY      There were no vitals filed for this visit.   Subjective Assessment - 04/07/21 1249     Subjective  patient nearly in tearss - pain in fingers - stabbing.    Pertinent History Pt sustained a Comminuted distal radial fracture with extension to the radiocarpal  joint on 11/28/20, pt was treated non-operatively    Currently in Pain? Yes    Pain Score 10-Worst pain ever    Pain Location Hand    Pain Orientation Right    Pain Descriptors / Indicators Sharp    Pain Type Acute pain    Pain Onset 1 to 4 weeks ago    Pain Frequency Constant    Aggravating Factors  unsure    Pain Relieving Factors heat                          OT  Treatments/Exercises (OP) - 04/07/21 0001       ADLs   ADL Comments Patient very limited by nerve pain.  Patient also with arthritic like pain.  Patient encouraged to discuss with PCP (or Ortho) to determine pain relief meds that she can tolerate, that may help with inflammation.      Modalities   Modalities Ultrasound      Ultrasound   Ultrasound Location hand and fingers    Ultrasound Parameters 63mhz, continuous, 0.8w/cm2 x 78min    Ultrasound Goals Pain      RUE Fluidotherapy   Number Minutes Fluidotherapy 12 Minutes    RUE Fluidotherapy Location Hand;Wrist;Forearm    Comments to reduce pain.  stretching and exercising hand during modality      Manual Therapy   Manual therapy comments Desensitization of fingers/palm with massage ball - self stim    Joint Mobilization Index and long finger Metacarpals, MCP/PIP                    OT Education - 04/07/21 [place]     Education Details discuss with MD pain management -  anti-inflammatory meds    Person(s) Educated Patient    Methods Explanation    Comprehension Verbalized understanding              OT Short Term Goals - 04/07/21 1443       OT SHORT TERM GOAL #1   Title Patient will demonstrate effective edema management techniques due 03/05/21    Time 4    Period Weeks    Status Achieved   edema glove issued however pt using inconsistently   Target Date 03/05/21      OT SHORT TERM GOAL #2   Title Patient will complete a home exercise program designed to improve active and passive range of motion in forearm, wrist, and digits    Time 4    Period Weeks    Status Achieved      OT SHORT TERM GOAL #3   Title Patient will demonstrate ability to contact pads 2-5  of fingers to palm to help facilitate gross grasp    Time 4    Period Weeks    Status Achieved      OT SHORT TERM GOAL #4   Title Patient will demonstrate ability to oppose thumb to 4th and 5th digit tips with min assist    Time 4    Period Weeks     Status Achieved   mod I     OT SHORT TERM GOAL #5   Title Patient will demonstrate 12 lb grip strength in RUE DUE 6/25    Time 4    Period Weeks    Status On-going    Target Date 04/16/21               OT Long Term Goals - 04/07/21 1443       OT LONG TERM GOAL #1   Title Patient will complete HEP designed to improve functional strength and use of dominant RUE DUE 6/13    Time 8    Period Weeks    Status Achieved      OT LONG TERM GOAL #2   Title Patient will demonstrate 90% composite flexion and extension right hand    Time 8    Period Weeks    Status Achieved      OT LONG TERM GOAL #3   Title Patient will demonstrate 10 lb increase in grip strength in right hand to help with functional grip and carry    Time 8    Period Weeks    Status Achieved      OT LONG TERM GOAL #4   Title Patient will demonstrate sufficient stamina and strength/range of motion in right hand 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    Time 8    Period Weeks    Status Achieved      OT LONG TERM GOAL #5   Title Patient will demonstrate at least 5 lb increase in pinch strength in right hand to aide with opening packages, holding small items    Time 8    Period Weeks    Status Achieved      OT LONG TERM GOAL #6   Title Patient will demonstrate improved wrist and forearm strength as evidenced by her ability to water plants with RUE without support of left hand due 7/25    Time 8    Period Weeks    Status On-going      OT LONG TERM GOAL #7   Title Patient will self report less discomfort  and digit recoil when using power strike on keys in piano piece.    Time 8    Period Weeks    Status On-going      OT LONG TERM GOAL #8   Title Patient will demonstrate improved pincer grasp and bimanual skills to be able to zip choir robe in reasonable timeframe.    Time 8    Period Weeks    Status Achieved      OT LONG TERM GOAL  #9   TITLE Patient will improve grip strength to at least  20 lbs RUE    Time 8    Period Weeks    Status On-going      OT LONG TERM GOAL  #10   TITLE Patient will report improved speed and flexibility with complicated piano piece Loetta Rough or [place]- per patient)    Time 8    Period Weeks    Status On-going                   Plan - 04/07/21 1443     Clinical Impression Statement Pt expressing frustration at pain in right hand - specifically thumb, index and long finger - describes nerve pain.  Patient continues to report improved ability to play piano/organ.    OT Occupational Profile and History Problem Focused Assessment - Including review of records relating to presenting problem    Occupational performance deficits (Please refer to evaluation for details): ADL's;IADL's;Work;Leisure    Body Structure / Function / Physical Skills ADL;Coordination;UE functional use;Sensation;Fascial restriction;Decreased knowledge of precautions;Decreased knowledge of use of DME;Flexibility;IADL;Pain;Skin integrity;Strength;FMC;Dexterity;Edema;Mobility;ROM    Clinical Decision Making Limited treatment options, no task modification necessary    Comorbidities Affecting Occupational Performance: None    Modification or Assistance to Complete Evaluation  No modification of tasks or assist necessary to complete eval    OT Frequency 2x / week    OT Duration 8 weeks    OT Treatment/Interventions Self-care/ADL training;Therapeutic exercise;Patient/family education;Splinting;Compression bandaging;Moist Heat;Paraffin;Fluidtherapy;Therapeutic activities;Cryotherapy;Ultrasound;Contrast Bath;DME and/or AE instruction;Manual Therapy;Passive range of motion    Plan continue fluidotherapy or paraffin, putty, A/ROM and P/ROM to hand,  functional use -    Consulted and Agree with Plan of Care Patient             Patient will benefit from skilled therapeutic intervention in order to improve the following deficits and impairments:   Body Structure / Function / Physical  Skills: ADL, Coordination, UE functional use, Sensation, Fascial restriction, Decreased knowledge of precautions, Decreased knowledge of use of DME, Flexibility, IADL, Pain, Skin integrity, Strength, FMC, Dexterity, Edema, Mobility, ROM       Visit Diagnosis: Stiffness of right wrist, not elsewhere classified  Stiffness of right hand, not elsewhere classified  Other disturbances of skin sensation  Muscle weakness (generalized)  Localized edema    Problem List Patient Active Problem List   Diagnosis Date Noted   Numbness 07/30/2019   Spinal stenosis of lumbar region 07/30/2019   Osteopenia after menopause 05/29/2019   Over weight 05/29/2019   Elevated blood pressure reading 05/29/2019   Immunization due 08/03/2017   Left leg swelling 05/04/2017   Allergic rhinitis 10/11/2015   Hypothyroidism 12/10/2013   Peripheral neuropathy 05/16/2013    Mariah Milling 04/07/2021, 2:48 PM  [facility] [address] [place], [place], [address] Phone: [phone]   Fax:  [phone]  Name: Krystal Moore MRN: [id number] Date of Birth: 10-27-1929

## 2021-04-08 ENCOUNTER — Telehealth: Payer: Self-pay | Admitting: Internal Medicine

## 2021-04-08 NOTE — Telephone Encounter (Signed)
Pt stated that regular meds are not helping her nerve pain and that she hasnt slept at night for two weeks/ pt was advised to ask Dr. [name] if it is ok for her to try [facility] which is a mushroom extract that you can put in tea or coffee in the morning./ pt stated she is willing to try anything / Pt also spoke with her neuro therapist about this as well yesterday /please advise

## 2021-04-08 NOTE — Telephone Encounter (Signed)
Will forward to provider  

## 2021-04-12 ENCOUNTER — Other Ambulatory Visit: Payer: Self-pay

## 2021-04-12 ENCOUNTER — Encounter: Payer: Self-pay | Admitting: Occupational Therapy

## 2021-04-12 ENCOUNTER — Ambulatory Visit: Payer: PPO | Admitting: Occupational Therapy

## 2021-04-12 DIAGNOSIS — R208 Other disturbances of skin sensation: Secondary | ICD-10-CM

## 2021-04-12 DIAGNOSIS — M25631 Stiffness of right wrist, not elsewhere classified: Secondary | ICD-10-CM

## 2021-04-12 DIAGNOSIS — M25641 Stiffness of right hand, not elsewhere classified: Secondary | ICD-10-CM

## 2021-04-12 DIAGNOSIS — M6281 Muscle weakness (generalized): Secondary | ICD-10-CM

## 2021-04-12 DIAGNOSIS — R600 Localized edema: Secondary | ICD-10-CM

## 2021-04-12 NOTE — Therapy (Signed)
[facility] [address] [place], [place], [address] Phone: [phone]   Fax:  [phone]  Occupational Therapy Treatment  Patient Details  Name: Krystal Moore MRN: [id number] Date of Birth: 1930-04-06 Referring Provider (OT): [name], [place]   Encounter Date: 04/12/2021   OT End of Session - 04/12/21 1500     Visit Number 21    Number of Visits 31    Date for OT Re-Evaluation 05/16/21    Authorization Type HTA No auth - VL:MN    Authorization - Visit Number 20    Progress Note Due on Visit 30             Past Medical History:  Diagnosis Date   Anemia    Narcolepsy    Neuropathy    Obesity    Pneumonia    Polyneuropathy in other diseases classified elsewhere [facility])    Shoulder fracture, left     Past Surgical History:  Procedure Laterality Date   CATARACT EXTRACTION Left 06/2019   MOUTH SURGERY     NASAL SINUS SURGERY      There were no vitals filed for this visit.   Subjective Assessment - 04/12/21 1406     Subjective  Patient reports CBD gummies are helping with pain    Pertinent History Pt sustained a Comminuted distal radial fracture with extension to the radiocarpal  joint on 11/28/20, pt was treated non-operatively    Currently in Pain? Yes    Pain Score 5     Pain Location Hand    Pain Orientation Right    Pain Descriptors / Indicators Aching                [facility] OT Assessment - 04/12/21 0001       AROM   Overall AROM Comments 50 wrist extension/ 45 wrist flexion                      OT Treatments/Exercises (OP) - 04/12/21 0001       RUE Fluidotherapy   Number Minutes Fluidotherapy 12 Minutes    RUE Fluidotherapy Location Hand;Wrist;Forearm    Comments to reduce pain and stiffness      Manual Therapy   Joint Mobilization Index and long finger Metacarpals, MCP/PIP, radius rolling over ulna for supination,  Light traction and mobilization at  wrist to promote increased wrist flex                    OT Education - 04/12/21 1457     Education Details educated patient on gentle self stretch for right wrist flexion    Person(s) Educated Patient    Methods Explanation;Demonstration;Tactile cues;Verbal cues    Comprehension Verbalized understanding;Returned demonstration              OT Short Term Goals - 04/12/21 1502       OT SHORT TERM GOAL #1   Title Patient will demonstrate effective edema management techniques due 03/05/21    Time 4    Period Weeks    Status Achieved   edema glove issued however pt using inconsistently   Target Date 03/05/21      OT SHORT TERM GOAL #2   Title Patient will complete a home exercise program designed to improve active and passive range of motion in forearm, wrist, and digits    Time 4    Period Weeks    Status Achieved  OT SHORT TERM GOAL #3   Title Patient will demonstrate ability to contact pads 2-5  of fingers to palm to help facilitate gross grasp    Time 4    Period Weeks    Status Achieved      OT SHORT TERM GOAL #4   Title Patient will demonstrate ability to oppose thumb to 4th and 5th digit tips with min assist    Time 4    Period Weeks    Status Achieved   mod I     OT SHORT TERM GOAL #5   Title Patient will demonstrate 12 lb grip strength in RUE DUE 6/25    Time 4    Period Weeks    Status On-going    Target Date 04/16/21               OT Long Term Goals - 04/12/21 1502       OT LONG TERM GOAL #1   Title Patient will complete HEP designed to improve functional strength and use of dominant RUE DUE 6/13    Time 8    Period Weeks    Status Achieved      OT LONG TERM GOAL #2   Title Patient will demonstrate 90% composite flexion and extension right hand    Time 8    Period Weeks    Status Achieved      OT LONG TERM GOAL #3   Title Patient will demonstrate 10 lb increase in grip strength in right hand to help with functional grip and  carry    Time 8    Period Weeks    Status Achieved      OT LONG TERM GOAL #4   Title Patient will demonstrate sufficient stamina and strength/range of motion in right hand 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    Time 8    Period Weeks    Status Achieved      OT LONG TERM GOAL #5   Title Patient will demonstrate at least 5 lb increase in pinch strength in right hand to aide with opening packages, holding small items    Time 8    Period Weeks    Status Achieved      OT LONG TERM GOAL #6   Title Patient will demonstrate improved wrist and forearm strength as evidenced by her ability to water plants with RUE without support of left hand due 7/25    Time 8    Period Weeks    Status On-going      OT LONG TERM GOAL #7   Title Patient will self report less discomfort and digit recoil when using power strike on keys in piano piece.    Time 8    Period Weeks    Status On-going      OT LONG TERM GOAL #8   Title Patient will demonstrate improved pincer grasp and bimanual skills to be able to zip choir robe in reasonable timeframe.    Time 8    Period Weeks    Status Achieved      OT LONG TERM GOAL  #9   TITLE Patient will improve grip strength to at least 20 lbs RUE    Time 8    Period Weeks    Status On-going      OT LONG TERM GOAL  #10   TITLE Patient will report improved speed and flexibility with complicated piano piece Loetta Rough or [place]- per patient)  Time 8    Period Weeks    Status On-going                   Plan - 04/12/21 1500     Clinical Impression Statement Patient reports pain less this week - has been wearing edema glove, and taking CBD gummies.    OT Occupational Profile and History Problem Focused Assessment - Including review of records relating to presenting problem    Occupational performance deficits (Please refer to evaluation for details): ADL's;IADL's;Work;Leisure    Body Structure / Function / Physical Skills  ADL;Coordination;UE functional use;Sensation;Fascial restriction;Decreased knowledge of precautions;Decreased knowledge of use of DME;Flexibility;IADL;Pain;Skin integrity;Strength;FMC;Dexterity;Edema;Mobility;ROM    Rehab Potential Good    Clinical Decision Making Limited treatment options, no task modification necessary    Comorbidities Affecting Occupational Performance: None    Modification or Assistance to Complete Evaluation  No modification of tasks or assist necessary to complete eval    OT Frequency 2x / week    OT Duration 8 weeks    OT Treatment/Interventions Self-care/ADL training;Therapeutic exercise;Patient/family education;Splinting;Compression bandaging;Moist Heat;Paraffin;Fluidtherapy;Therapeutic activities;Cryotherapy;Ultrasound;Contrast Bath;DME and/or AE instruction;Manual Therapy;Passive range of motion    Plan continue fluidotherapy or paraffin, putty, A/ROM and P/ROM to hand,  functional use -    Consulted and Agree with Plan of Care Patient             Patient will benefit from skilled therapeutic intervention in order to improve the following deficits and impairments:   Body Structure / Function / Physical Skills: ADL, Coordination, UE functional use, Sensation, Fascial restriction, Decreased knowledge of precautions, Decreased knowledge of use of DME, Flexibility, IADL, Pain, Skin integrity, Strength, FMC, Dexterity, Edema, Mobility, ROM       Visit Diagnosis: Stiffness of right wrist, not elsewhere classified  Stiffness of right hand, not elsewhere classified  Other disturbances of skin sensation  Muscle weakness (generalized)  Localized edema    Problem List Patient Active Problem List   Diagnosis Date Noted   Numbness 07/30/2019   Spinal stenosis of lumbar region 07/30/2019   Osteopenia after menopause 05/29/2019   Over weight 05/29/2019   Elevated blood pressure reading 05/29/2019   Immunization due 08/03/2017   Left leg swelling 05/04/2017    Allergic rhinitis 10/11/2015   Hypothyroidism 12/10/2013   Peripheral neuropathy 05/16/2013    Mariah Milling 04/12/2021, 3:04 PM  [facility] [address] [place], [place], [address] Phone: [phone]   Fax:  [phone]  Name: Shreeya Recendiz MRN: [id number] Date of Birth: February 13, 1930

## 2021-04-12 NOTE — Telephone Encounter (Signed)
Patient aware of message per Dr. [name] regarding [facility].

## 2021-04-14 ENCOUNTER — Encounter: Payer: Self-pay | Admitting: Occupational Therapy

## 2021-04-14 ENCOUNTER — Ambulatory Visit: Payer: PPO | Admitting: Occupational Therapy

## 2021-04-14 ENCOUNTER — Other Ambulatory Visit: Payer: Self-pay

## 2021-04-14 DIAGNOSIS — R600 Localized edema: Secondary | ICD-10-CM

## 2021-04-14 DIAGNOSIS — R208 Other disturbances of skin sensation: Secondary | ICD-10-CM

## 2021-04-14 DIAGNOSIS — M25641 Stiffness of right hand, not elsewhere classified: Secondary | ICD-10-CM | POA: Diagnosis not present

## 2021-04-14 DIAGNOSIS — M25631 Stiffness of right wrist, not elsewhere classified: Secondary | ICD-10-CM

## 2021-04-14 DIAGNOSIS — M6281 Muscle weakness (generalized): Secondary | ICD-10-CM

## 2021-04-14 NOTE — Therapy (Signed)
[facility] [address] [place], [place], [address] Phone: [phone]   Fax:  [phone]  Occupational Therapy Treatment  Patient Details  Name: Krystal Moore MRN: [id number] Date of Birth: August 25, 1930 Referring Provider (OT): [name], [place]   Encounter Date: 04/14/2021   OT End of Session - 04/14/21 1500     Visit Number 22    Number of Visits 31    Date for OT Re-Evaluation 05/16/21    Authorization Type HTA No auth - VL:MN    Progress Note Due on Visit 15    OT Start Time 1400    OT Stop Time 1445    OT Time Calculation (min) 45 min    Equipment Utilized During Treatment fluidotherapy, Ultrasound    Activity Tolerance Patient tolerated treatment well    Behavior During Therapy [facility] for tasks assessed/performed             Past Medical History:  Diagnosis Date   Anemia    Narcolepsy    Neuropathy    Obesity    Pneumonia    Polyneuropathy in other diseases classified elsewhere [facility])    Shoulder fracture, left     Past Surgical History:  Procedure Laterality Date   CATARACT EXTRACTION Left 06/2019   MOUTH SURGERY     NASAL SINUS SURGERY      There were no vitals filed for this visit.   Subjective Assessment - 04/14/21 1415     Subjective  No real pain just buzzing    Pertinent History Pt sustained a Comminuted distal radial fracture with extension to the radiocarpal  joint on 11/28/20, pt was treated non-operatively    Currently in Pain? No/denies    Pain Score 0-No pain                [facility] OT Assessment - 04/14/21 0001       Hand Function   Right Hand Grip (lbs) 18                      OT Treatments/Exercises (OP) - 04/14/21 0001       Ultrasound   Ultrasound Location palm, wrist    Ultrasound Parameters 81mhz, continuous, 0.8w/cm2, x 8 min      RUE Fluidotherapy   Number Minutes Fluidotherapy 10 Minutes    RUE Fluidotherapy Location  Hand;Wrist;Forearm    Comments prior to stretching to improve flexibility.  Exercising wrist and fingers in heat      Manual Therapy   Joint Mobilization long and ring to increase isolated extension as needed for piano.  Composite flexion - pain at index MCP with flexion end range,                      OT Short Term Goals - 04/14/21 1503       OT SHORT TERM GOAL #1   Title Patient will demonstrate effective edema management techniques due 03/05/21    Time 4    Period Weeks    Status Achieved   edema glove issued however pt using inconsistently   Target Date 03/05/21      OT SHORT TERM GOAL #2   Title Patient will complete a home exercise program designed to improve active and passive range of motion in forearm, wrist, and digits    Time 4    Period Weeks    Status Achieved      OT SHORT TERM GOAL #  3   Title Patient will demonstrate ability to contact pads 2-5  of fingers to palm to help facilitate gross grasp    Time 4    Period Weeks    Status Achieved      OT SHORT TERM GOAL #4   Title Patient will demonstrate ability to oppose thumb to 4th and 5th digit tips with min assist    Time 4    Period Weeks    Status Achieved   mod I     OT SHORT TERM GOAL #5   Title Patient will demonstrate 12 lb grip strength in RUE DUE 6/25    Time 4    Period Weeks    Status Achieved    Target Date 04/16/21               OT Long Term Goals - 04/14/21 1433       OT LONG TERM GOAL #1   Title Patient will complete HEP designed to improve functional strength and use of dominant RUE DUE 6/13    Time 8    Period Weeks    Status Achieved      OT LONG TERM GOAL #2   Title Patient will demonstrate 90% composite flexion and extension right hand    Time 8    Period Weeks    Status Achieved      OT LONG TERM GOAL #3   Title Patient will demonstrate 10 lb increase in grip strength in right hand to help with functional grip and carry    Time 8    Period Weeks    Status  Achieved      OT LONG TERM GOAL #4   Title Patient will demonstrate sufficient stamina and strength/range of motion in right hand 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    Time 8    Period Weeks    Status Achieved      OT LONG TERM GOAL #5   Title Patient will demonstrate at least 5 lb increase in pinch strength in right hand to aide with opening packages, holding small items    Time 8    Period Weeks    Status Achieved      OT LONG TERM GOAL #6   Title Patient will demonstrate improved wrist and forearm strength as evidenced by her ability to water plants with RUE without support of left hand due 7/25    Time 8    Period Weeks    Status On-going      OT LONG TERM GOAL #7   Title Patient will self report less discomfort and digit recoil when using power strike on keys in piano piece.    Time 8    Period Weeks    Status On-going      OT LONG TERM GOAL #8   Title Patient will demonstrate improved pincer grasp and bimanual skills to be able to zip choir robe in reasonable timeframe.    Time 8    Period Weeks    Status Achieved      OT LONG TERM GOAL  #9   TITLE Patient will improve grip strength to at least 20 lbs RUE    Time 8    Period Weeks    Status On-going      OT LONG TERM GOAL  #10   TITLE Patient will report improved speed and flexibility with complicated piano piece Loetta Rough or Chopin- per patient)    Time 8  Period Weeks    Status On-going                   Plan - 04/14/21 1500     Clinical Impression Statement Patient has been wearing edema glove throughout the day and has noticed reduction in pain    OT Occupational Profile and History Problem Focused Assessment - Including review of records relating to presenting problem    Occupational performance deficits (Please refer to evaluation for details): ADL's;IADL's;Work;Leisure    Body Structure / Function / Physical Skills ADL;Coordination;UE functional use;Sensation;Fascial  restriction;Decreased knowledge of precautions;Decreased knowledge of use of DME;Flexibility;IADL;Pain;Skin integrity;Strength;FMC;Dexterity;Edema;Mobility;ROM    Rehab Potential Good    Clinical Decision Making Limited treatment options, no task modification necessary    Comorbidities Affecting Occupational Performance: None    Modification or Assistance to Complete Evaluation  No modification of tasks or assist necessary to complete eval    OT Frequency 2x / week    OT Duration 8 weeks    OT Treatment/Interventions Self-care/ADL training;Therapeutic exercise;Patient/family education;Splinting;Compression bandaging;Moist Heat;Paraffin;Fluidtherapy;Therapeutic activities;Cryotherapy;Ultrasound;Contrast Bath;DME and/or AE instruction;Manual Therapy;Passive range of motion    Plan continue fluidotherapy or paraffin, putty, A/ROM and P/ROM to hand,  limited MCP flexion esp index, limited isolated MCP extension - 3rd/4th, Limited wrist range/strength, functional use    Consulted and Agree with Plan of Care Patient             Patient will benefit from skilled therapeutic intervention in order to improve the following deficits and impairments:   Body Structure / Function / Physical Skills: ADL, Coordination, UE functional use, Sensation, Fascial restriction, Decreased knowledge of precautions, Decreased knowledge of use of DME, Flexibility, IADL, Pain, Skin integrity, Strength, FMC, Dexterity, Edema, Mobility, ROM       Visit Diagnosis: Stiffness of right wrist, not elsewhere classified  Stiffness of right hand, not elsewhere classified  Other disturbances of skin sensation  Muscle weakness (generalized)  Localized edema    Problem List Patient Active Problem List   Diagnosis Date Noted   Numbness 07/30/2019   Spinal stenosis of lumbar region 07/30/2019   Osteopenia after menopause 05/29/2019   Over weight 05/29/2019   Elevated blood pressure reading 05/29/2019   Immunization  due 08/03/2017   Left leg swelling 05/04/2017   Allergic rhinitis 10/11/2015   Hypothyroidism 12/10/2013   Peripheral neuropathy 05/16/2013    Mariah Milling 04/14/2021, 3:10 PM  [facility] [address] [place], [place], [address] Phone: [phone]   Fax:  [phone]  Name: Krystal Moore MRN: [id number] Date of Birth: 1929-12-21

## 2021-04-19 ENCOUNTER — Ambulatory Visit: Payer: PPO | Admitting: Occupational Therapy

## 2021-04-19 ENCOUNTER — Other Ambulatory Visit: Payer: Self-pay

## 2021-04-19 DIAGNOSIS — M6281 Muscle weakness (generalized): Secondary | ICD-10-CM

## 2021-04-19 DIAGNOSIS — M25641 Stiffness of right hand, not elsewhere classified: Secondary | ICD-10-CM

## 2021-04-19 DIAGNOSIS — R208 Other disturbances of skin sensation: Secondary | ICD-10-CM

## 2021-04-19 DIAGNOSIS — M25631 Stiffness of right wrist, not elsewhere classified: Secondary | ICD-10-CM

## 2021-04-19 NOTE — Therapy (Signed)
[facility] [address] [place], [place], [address] Phone: [phone]   Fax:  [phone]  Occupational Therapy Treatment  Patient Details  Name: Krystal Moore MRN: [id number] Date of Birth: 1930-09-27 Referring Provider (OT): [name], [place]   Encounter Date: 04/19/2021   OT End of Session - 04/19/21 1235     Visit Number 23    Number of Visits 31    Date for OT Re-Evaluation 05/16/21    Authorization Type HTA No auth - VL:MN    Authorization - Visit Number 43    OT Start Time 1233    OT Stop Time 1315    OT Time Calculation (min) 42 min             Past Medical History:  Diagnosis Date   Anemia    Narcolepsy    Neuropathy    Obesity    Pneumonia    Polyneuropathy in other diseases classified elsewhere [facility])    Shoulder fracture, left     Past Surgical History:  Procedure Laterality Date   CATARACT EXTRACTION Left 06/2019   MOUTH SURGERY     NASAL SINUS SURGERY      There were no vitals filed for this visit.   Subjective Assessment - 04/19/21 1234     Subjective  Pt reports constant pain    Pertinent History Pt sustained a Comminuted distal radial fracture with extension to the radiocarpal  joint on 11/28/20, pt was treated non-operatively    Currently in Pain? Yes    Pain Score 6     Pain Location Hand    Pain Orientation Right    Pain Descriptors / Indicators Aching    Pain Type Acute pain    Pain Onset 1 to 4 weeks ago    Pain Frequency Constant    Aggravating Factors  unsure    Pain Relieving Factors heat              Treatment: Fluidotherapy x 10 mins for pain and stiffness, no adverse reactions Joint mobs at MP's of Right hand, followed by passive composite and MP flexion, reverse blocking exercise Gripper set at level 1 for sustained grip to pick up  half the container of 1 inch blocks. Pt practiced cutting food with a knife and fork using bilateral UE's,  min v.c  Pt practiced using kitchen shears with RUE to cut, pt reports eating a lot of vegetables and the kitchn shears will be healpful, min v.c Pt practiced walking fingers to right and lft sides for gentle stretch, min v.c/ facilitation. Graded clothespins for sustained pinch 1-8 # with RUE, improved performance.                         OT Short Term Goals - 04/14/21 1503       OT SHORT TERM GOAL #1   Title Patient will demonstrate effective edema management techniques due 03/05/21    Time 4    Period Weeks    Status Achieved   edema glove issued however pt using inconsistently   Target Date 03/05/21      OT SHORT TERM GOAL #2   Title Patient will complete a home exercise program designed to improve active and passive range of motion in forearm, wrist, and digits    Time 4    Period Weeks    Status Achieved      OT SHORT TERM GOAL #3  Title Patient will demonstrate ability to contact pads 2-5  of fingers to palm to help facilitate gross grasp    Time 4    Period Weeks    Status Achieved      OT SHORT TERM GOAL #4   Title Patient will demonstrate ability to oppose thumb to 4th and 5th digit tips with min assist    Time 4    Period Weeks    Status Achieved   mod I     OT SHORT TERM GOAL #5   Title Patient will demonstrate 12 lb grip strength in RUE DUE 6/25    Time 4    Period Weeks    Status Achieved    Target Date 04/16/21               OT Long Term Goals - 04/14/21 1433       OT LONG TERM GOAL #1   Title Patient will complete HEP designed to improve functional strength and use of dominant RUE DUE 6/13    Time 8    Period Weeks    Status Achieved      OT LONG TERM GOAL #2   Title Patient will demonstrate 90% composite flexion and extension right hand    Time 8    Period Weeks    Status Achieved      OT LONG TERM GOAL #3   Title Patient will demonstrate 10 lb increase in grip strength in right hand to help with functional grip and  carry    Time 8    Period Weeks    Status Achieved      OT LONG TERM GOAL #4   Title Patient will demonstrate sufficient stamina and strength/range of motion in right hand 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    Time 8    Period Weeks    Status Achieved      OT LONG TERM GOAL #5   Title Patient will demonstrate at least 5 lb increase in pinch strength in right hand to aide with opening packages, holding small items    Time 8    Period Weeks    Status Achieved      OT LONG TERM GOAL #6   Title Patient will demonstrate improved wrist and forearm strength as evidenced by her ability to water plants with RUE without support of left hand due 7/25    Time 8    Period Weeks    Status On-going      OT LONG TERM GOAL #7   Title Patient will self report less discomfort and digit recoil when using power strike on keys in piano piece.    Time 8    Period Weeks    Status On-going      OT LONG TERM GOAL #8   Title Patient will demonstrate improved pincer grasp and bimanual skills to be able to zip choir robe in reasonable timeframe.    Time 8    Period Weeks    Status Achieved      OT LONG TERM GOAL  #9   TITLE Patient will improve grip strength to at least 20 lbs RUE    Time 8    Period Weeks    Status On-going      OT LONG TERM GOAL  #10   TITLE Patient will report improved speed and flexibility with complicated piano piece Loetta Rough or Chopin- per patient)    Time 8  Period Weeks    Status On-going                    Patient will benefit from skilled therapeutic intervention in order to improve the following deficits and impairments:           Visit Diagnosis: Stiffness of right wrist, not elsewhere classified  Stiffness of right hand, not elsewhere classified  Other disturbances of skin sensation  Muscle weakness (generalized)    Problem List Patient Active Problem List   Diagnosis Date Noted   Numbness 07/30/2019   Spinal stenosis  of lumbar region 07/30/2019   Osteopenia after menopause 05/29/2019   Over weight 05/29/2019   Elevated blood pressure reading 05/29/2019   Immunization due 08/03/2017   Left leg swelling 05/04/2017   Allergic rhinitis 10/11/2015   Hypothyroidism 12/10/2013   Peripheral neuropathy 05/16/2013    Domonick Sittner 04/19/2021, 12:37 PM  [facility] [address] [place], [place], [address] Phone: [phone]   Fax:  [phone]  Name: Krystal Moore MRN: [id number] Date of Birth: [birth year]

## 2021-04-21 ENCOUNTER — Encounter: Payer: PPO | Admitting: Occupational Therapy

## 2021-04-22 ENCOUNTER — Encounter: Payer: Self-pay | Admitting: Physical Medicine and Rehabilitation

## 2021-04-22 ENCOUNTER — Other Ambulatory Visit: Payer: Self-pay

## 2021-04-22 ENCOUNTER — Ambulatory Visit (INDEPENDENT_AMBULATORY_CARE_PROVIDER_SITE_OTHER): Payer: PPO | Admitting: Physical Medicine and Rehabilitation

## 2021-04-22 DIAGNOSIS — R202 Paresthesia of skin: Secondary | ICD-10-CM

## 2021-04-22 NOTE — Progress Notes (Signed)
Pain in right hand worse at night when not active. Touching anything causes pain to increase. First three fingers are the worst. Numbness in third finger. Right hand dominant No lotion per patient Numeric Pain Rating Scale and Functional Assessment Average Pain 5   In the last MONTH (on 0-10 scale) has pain interfered with the following?  1. General activity like being  able to carry out your everyday physical activities such as walking, climbing stairs, carrying groceries, or moving a chair?  Rating(10)

## 2021-04-26 ENCOUNTER — Other Ambulatory Visit: Payer: Self-pay

## 2021-04-26 ENCOUNTER — Ambulatory Visit: Payer: PPO | Admitting: Occupational Therapy

## 2021-04-26 ENCOUNTER — Ambulatory Visit: Payer: PPO | Attending: Physician Assistant | Admitting: Occupational Therapy

## 2021-04-26 ENCOUNTER — Encounter: Payer: Self-pay | Admitting: Occupational Therapy

## 2021-04-26 DIAGNOSIS — R208 Other disturbances of skin sensation: Secondary | ICD-10-CM | POA: Diagnosis not present

## 2021-04-26 DIAGNOSIS — M6281 Muscle weakness (generalized): Secondary | ICD-10-CM | POA: Diagnosis not present

## 2021-04-26 DIAGNOSIS — R600 Localized edema: Secondary | ICD-10-CM | POA: Insufficient documentation

## 2021-04-26 DIAGNOSIS — M25631 Stiffness of right wrist, not elsewhere classified: Secondary | ICD-10-CM | POA: Diagnosis not present

## 2021-04-26 DIAGNOSIS — M25641 Stiffness of right hand, not elsewhere classified: Secondary | ICD-10-CM | POA: Insufficient documentation

## 2021-04-27 NOTE — Therapy (Signed)
[facility] [address] [place], [place], [address] Phone: [phone]   Fax:  [phone]  Occupational Therapy Treatment  Patient Details  Name: Krystal Moore MRN: [id number] Date of Birth: [birth year] Referring Provider (OT): [name], [place]   Encounter Date: 04/26/2021   OT End of Session - 04/26/21 1421     Visit Number 24    Number of Visits 31    Date for OT Re-Evaluation 05/16/21    Authorization Type HTA No auth - VL:MN    Authorization - Visit Number 24    Progress Note Due on Visit 65    OT Start Time 6962    OT Stop Time 1445    OT Time Calculation (min) 40 min    Equipment Utilized During Treatment fluidotherapy    Activity Tolerance Patient tolerated treatment well    Behavior During Therapy [facility] for tasks assessed/performed             Past Medical History:  Diagnosis Date   Anemia    Narcolepsy    Neuropathy    Obesity    Pneumonia    Polyneuropathy in other diseases classified elsewhere [facility])    Shoulder fracture, left     Past Surgical History:  Procedure Laterality Date   CATARACT EXTRACTION Left 06/2019   MOUTH SURGERY     NASAL SINUS SURGERY      There were no vitals filed for this visit.   Subjective Assessment - 04/26/21 1410     Subjective  "I am frightened, and frustrated, and angry;" pt states she received the results of her nerve conduction study and the MD instructed her to seek follow-up w/ her MD "urgently," but that the earliest she could get an appt was 7/15   Pertinent History Pt sustained a Comminuted distal radial fracture with extension to the radiocarpal  joint on 11/28/20, pt was treated non-operatively    Currently in Pain? Yes    Pain Score 5     Pain Location Hand    Pain Orientation Right    Pain Descriptors / Indicators Numbness;Pins and needles    Pain Type Acute pain    Pain Onset More than a month ago    Pain Frequency Constant     Pain Relieving Factors heat; stretches; CBD gummies             OT Treatment/Exercises - 04/26/21    Fluidotherapy  10 minutes for RUE to facilitate pain relief, edema mgmt, and decrease stiffness prior to manual therapy; completed composite hand flexion/extension exercise during modality tx. No adverse reactions.    R MCP ROM OT-facilitated PROM of composite flexion/extension of R MCPs, holding stretch at end range, followed by AROM of MCP flexion (tabletop). Able to complete within tolerable level of pain w/ increased discomfort noted in index MCP at end range during PROM         OT Short Term Goals - 04/14/21 1503       OT SHORT TERM GOAL #1   Title Patient will demonstrate effective edema management techniques due 03/05/21    Time 4    Period Weeks    Status Achieved   edema glove issued however pt using inconsistently   Target Date 03/05/21      OT SHORT TERM GOAL #2   Title Patient will complete a home exercise program designed to improve active and passive range of motion in forearm, wrist, and digits    Time  4    Period Weeks    Status Achieved      OT SHORT TERM GOAL #3   Title Patient will demonstrate ability to contact pads 2-5  of fingers to palm to help facilitate gross grasp    Time 4    Period Weeks    Status Achieved      OT SHORT TERM GOAL #4   Title Patient will demonstrate ability to oppose thumb to 4th and 5th digit tips with min assist    Time 4    Period Weeks    Status Achieved   mod I     OT SHORT TERM GOAL #5   Title Patient will demonstrate 12 lb grip strength in RUE DUE 6/25    Time 4    Period Weeks    Status Achieved    Target Date 04/16/21             OT Long Term Goals - 04/14/21 1433       OT LONG TERM GOAL #1   Title Patient will complete HEP designed to improve functional strength and use of dominant RUE DUE 6/13    Time 8    Period Weeks    Status Achieved      OT LONG TERM GOAL #2   Title Patient will demonstrate 90%  composite flexion and extension right hand    Time 8    Period Weeks    Status Achieved      OT LONG TERM GOAL #3   Title Patient will demonstrate 10 lb increase in grip strength in right hand to help with functional grip and carry    Time 8    Period Weeks    Status Achieved      OT LONG TERM GOAL #4   Title Patient will demonstrate sufficient stamina and strength/range of motion in right hand 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    Time 8    Period Weeks    Status Achieved      OT LONG TERM GOAL #5   Title Patient will demonstrate at least 5 lb increase in pinch strength in right hand to aide with opening packages, holding small items    Time 8    Period Weeks    Status Achieved      OT LONG TERM GOAL #6   Title Patient will demonstrate improved wrist and forearm strength as evidenced by her ability to water plants with RUE without support of left hand due 7/25    Time 8    Period Weeks    Status On-going      OT LONG TERM GOAL #7   Title Patient will self report less discomfort and digit recoil when using power strike on keys in piano piece.    Time 8    Period Weeks    Status On-going      OT LONG TERM GOAL #8   Title Patient will demonstrate improved pincer grasp and bimanual skills to be able to zip choir robe in reasonable timeframe.    Time 8    Period Weeks    Status Achieved      OT LONG TERM GOAL  #9   TITLE Patient will improve grip strength to at least 20 lbs RUE    Time 8    Period Weeks    Status On-going      OT LONG TERM GOAL  #10   TITLE Patient will report  improved speed and flexibility with complicated piano piece Loetta Rough or [place]- per patient)    Time 8    Period Weeks    Status On-going             Plan - 04/26/21 1434     Clinical Impression Statement Per pt report, Phys Med MD reported the results of her electromyography were concerning; OT unable to review procedure results in pt's chart at this time. Pt expressed  significant frustration regarding her situation, with OT providing relevant condition-specific education and reassurance prn. Pt also stated that occupational therapy has been helpful.   OT Occupational Profile and History Problem Focused Assessment - Including review of records relating to presenting problem    Occupational performance deficits (Please refer to evaluation for details): ADL's;IADL's;Work;Leisure    Body Structure / Function / Physical Skills ADL;Coordination;UE functional use;Sensation;Fascial restriction;Decreased knowledge of precautions;Decreased knowledge of use of DME;Flexibility;IADL;Pain;Skin integrity;Strength;FMC;Dexterity;Edema;Mobility;ROM    Rehab Potential Good    Clinical Decision Making Limited treatment options, no task modification necessary    Comorbidities Affecting Occupational Performance: None    Modification or Assistance to Complete Evaluation  No modification of tasks or assist necessary to complete eval    OT Frequency 2x / week    OT Duration 8 weeks    OT Treatment/Interventions Self-care/ADL training;Therapeutic exercise;Patient/family education;Splinting;Compression bandaging;Moist Heat;Paraffin;Fluidtherapy;Therapeutic activities;Cryotherapy;Ultrasound;Contrast Bath;DME and/or AE instruction;Manual Therapy;Passive range of motion    Plan continue fluidotherapy or paraffin, putty, A/ROM and P/ROM to hand, limited MCP flexion esp index, limited isolated MCP extension - 3rd/4th, limited wrist range/strength, functional use    Consulted and Agree with Plan of Care Patient             Patient will benefit from skilled therapeutic intervention in order to improve the following deficits and impairments:   Body Structure / Function / Physical Skills: ADL, Coordination, UE functional use, Sensation, Fascial restriction, Decreased knowledge of precautions, Decreased knowledge of use of DME, Flexibility, IADL, Pain, Skin integrity, Strength, FMC, Dexterity,  Edema, Mobility, ROM   Visit Diagnosis: Stiffness of right wrist, not elsewhere classified  Stiffness of right hand, not elsewhere classified  Other disturbances of skin sensation  Muscle weakness (generalized)  Localized edema    Problem List Patient Active Problem List   Diagnosis Date Noted   Numbness 07/30/2019   Spinal stenosis of lumbar region 07/30/2019   Osteopenia after menopause 05/29/2019   Over weight 05/29/2019   Elevated blood pressure reading 05/29/2019   Immunization due 08/03/2017   Left leg swelling 05/04/2017   Allergic rhinitis 10/11/2015   Hypothyroidism 12/10/2013   Peripheral neuropathy 05/16/2013     Kathrine Cords, OTR/L, MSOT  04/26/2021, 2:45 PM  [facility] [address] [place], [place], [address] Phone: [phone]   Fax:  [phone]  Name: Merin Borjon MRN: [id number] Date of Birth: 1930/07/07

## 2021-04-28 ENCOUNTER — Ambulatory Visit: Payer: PPO | Admitting: Occupational Therapy

## 2021-05-02 NOTE — Procedures (Signed)
EMG & NCV Findings: Evaluation of the right median motor nerve showed prolonged distal onset latency (7.8 ms) and reduced amplitude (1.8 mV).  The right median (across palm) sensory nerve showed no response (Wrist) and prolonged distal peak latency (Palm, 4.5 ms).  All remaining nerves (as indicated in the following tables) were within normal limits.    All examined muscles (as indicated in the following table) showed no evidence of electrical instability.    Impression: The above electrodiagnostic study is ABNORMAL and reveals evidence of a severe right median nerve entrapment at the wrist (carpal tunnel syndrome) affecting sensory and motor components.   There is no significant electrodiagnostic evidence of any other focal nerve entrapment, brachial plexopathy or cervical radiculopathy.   Recommendations: 1.  Follow-up with referring physician. 2.  Continue current management of symptoms. 3.  Suggest surgical evaluation.  ___________________________ Laurence Spates FAAPMR Board Certified, American Board of Physical Medicine and Rehabilitation    Nerve Conduction Studies Anti Sensory Summary Table   Stim Site NR Peak (ms) Norm Peak (ms) P-T Amp (V) Norm P-T Amp Site1 Site2 Delta-P (ms) Dist (cm) Vel (m/s) Norm Vel (m/s)  Right Median Acr Palm Anti Sensory (2nd Digit)  30.6C  Wrist *NR  <3.6  >10 Wrist Palm  0.0    Palm    *4.5 <2.0 32.5         Right Radial Anti Sensory (Base 1st Digit)  31C  Wrist    2.3 <3.1 26.7  Wrist Base 1st Digit 2.3 0.0    Right Ulnar Anti Sensory (5th Digit)  31.1C  Wrist    3.3 <3.7 16.7 >15.0 Wrist 5th Digit 3.3 14.0 42 >38   Motor Summary Table   Stim Site NR Onset (ms) Norm Onset (ms) O-P Amp (mV) Norm O-P Amp Site1 Site2 Delta-0 (ms) Dist (cm) Vel (m/s) Norm Vel (m/s)  Right Median Motor (Abd Poll Brev)  31.6C  Wrist    *7.8 <4.2 *1.8 >5 Elbow Wrist 4.0 20.0 50 >50  Elbow    11.8  1.3         Right Ulnar Motor (Abd Dig Min)  31.6C  Wrist     3.4 <4.2 8.0 >3 B Elbow Wrist 2.9 17.0 59 >53  B Elbow    6.3  9.1  A Elbow B Elbow 0.8 10.0 125 >53  A Elbow    7.1  9.4          EMG   Side Muscle Nerve Root Ins Act Fibs Psw Amp Dur Poly Recrt Int Fraser Din Comment  Right Abd Poll Brev Median C8-T1 Nml Nml Nml Nml Nml 0 Nml Nml   Right 1stDorInt Ulnar C8-T1 Nml Nml Nml Nml Nml 0 Nml Nml   Right PronatorTeres Median C6-7 Nml Nml Nml Nml Nml 0 Nml Nml     Nerve Conduction Studies Anti Sensory Left/Right Comparison   Stim Site L Lat (ms) R Lat (ms) L-R Lat (ms) L Amp (V) R Amp (V) L-R Amp (%) Site1 Site2 L Vel (m/s) R Vel (m/s) L-R Vel (m/s)  Median Acr Palm Anti Sensory (2nd Digit)  30.6C  Wrist       Wrist Palm     Palm  *4.5   32.5        Radial Anti Sensory (Base 1st Digit)  31C  Wrist  2.3   26.7  Wrist Base 1st Digit     Ulnar Anti Sensory (5th Digit)  31.1C  Wrist  3.3  16.7  Wrist 5th Digit  42    Motor Left/Right Comparison   Stim Site L Lat (ms) R Lat (ms) L-R Lat (ms) L Amp (mV) R Amp (mV) L-R Amp (%) Site1 Site2 L Vel (m/s) R Vel (m/s) L-R Vel (m/s)  Median Motor (Abd Poll Brev)  31.6C  Wrist  *7.8   *1.8  Elbow Wrist  50   Elbow  11.8   1.3        Ulnar Motor (Abd Dig Min)  31.6C  Wrist  3.4   8.0  B Elbow Wrist  59   B Elbow  6.3   9.1  A Elbow B Elbow  125   A Elbow  7.1   9.4           Waveforms:

## 2021-05-02 NOTE — Progress Notes (Signed)
Krystal Moore - 87 y.o. female MRN [id number]  Date of birth: 1929/11/22  Office Visit Note: Visit Date: 04/22/2021 PCP: Ladell Pier, MD Referred by: Ladell Pier, MD  Subjective: Chief Complaint  Patient presents with   Right Hand - Pain, Numbness   HPI:  Krystal Moore is a 87 y.o. female who comes in today at the request of Dr. Eduard Roux for electrodiagnostic study of the Right upper extremities.  Patient is Right hand dominant.  She reports chronic severe right hand pain with stiffness and some numbness particularly in the third finger.  It seems to be worse at night with nocturnal complaints.  She also endorses pain with touching anything or having anything touch her hand.  She has been in extensive physical therapy.  She has not had prior electrodiagnostic study.  She denies any left-sided complaints.  It does limit her daily activities.   ROS Otherwise per HPI.  Assessment & Plan: Visit Diagnoses:    ICD-10-CM   1. Paresthesia of skin  R20.2 NCV with EMG (electromyography)      Plan: Impression: The above electrodiagnostic study is ABNORMAL and reveals evidence of a severe right median nerve entrapment at the wrist (carpal tunnel syndrome) affecting sensory and motor components.   There is no significant electrodiagnostic evidence of any other focal nerve entrapment, brachial plexopathy or cervical radiculopathy.   Recommendations: 1.  Follow-up with referring physician. 2.  Continue current management of symptoms. 3.  Suggest surgical evaluation.  Meds & Orders: No orders of the defined types were placed in this encounter.   Orders Placed This Encounter  Procedures   NCV with EMG (electromyography)    Follow-up: Return for  Eduard Roux, MD.   Procedures: No procedures performed  EMG & NCV Findings: Evaluation of the right median motor nerve showed prolonged distal onset latency (7.8 ms) and reduced amplitude (1.8 mV).  The right  median (across palm) sensory nerve showed no response (Wrist) and prolonged distal peak latency (Palm, 4.5 ms).  All remaining nerves (as indicated in the following tables) were within normal limits.    All examined muscles (as indicated in the following table) showed no evidence of electrical instability.    Impression: The above electrodiagnostic study is ABNORMAL and reveals evidence of a severe right median nerve entrapment at the wrist (carpal tunnel syndrome) affecting sensory and motor components.   There is no significant electrodiagnostic evidence of any other focal nerve entrapment, brachial plexopathy or cervical radiculopathy.   Recommendations: 1.  Follow-up with referring physician. 2.  Continue current management of symptoms. 3.  Suggest surgical evaluation.  ___________________________ Laurence Spates FAAPMR Board Certified, American Board of Physical Medicine and Rehabilitation    Nerve Conduction Studies Anti Sensory Summary Table   Stim Site NR Peak (ms) Norm Peak (ms) P-T Amp (V) Norm P-T Amp Site1 Site2 Delta-P (ms) Dist (cm) Vel (m/s) Norm Vel (m/s)  Right Median Acr Palm Anti Sensory (2nd Digit)  30.6C  Wrist *NR  <3.6  >10 Wrist Palm  0.0    Palm    *4.5 <2.0 32.5         Right Radial Anti Sensory (Base 1st Digit)  31C  Wrist    2.3 <3.1 26.7  Wrist Base 1st Digit 2.3 0.0    Right Ulnar Anti Sensory (5th Digit)  31.1C  Wrist    3.3 <3.7 16.7 >15.0 Wrist 5th Digit 3.3 14.0 42 >38   Motor Summary Table  Stim Site NR Onset (ms) Norm Onset (ms) O-P Amp (mV) Norm O-P Amp Site1 Site2 Delta-0 (ms) Dist (cm) Vel (m/s) Norm Vel (m/s)  Right Median Motor (Abd Poll Brev)  31.6C  Wrist    *7.8 <4.2 *1.8 >5 Elbow Wrist 4.0 20.0 50 >50  Elbow    11.8  1.3         Right Ulnar Motor (Abd Dig Min)  31.6C  Wrist    3.4 <4.2 8.0 >3 B Elbow Wrist 2.9 17.0 59 >53  B Elbow    6.3  9.1  A Elbow B Elbow 0.8 10.0 125 >53  A Elbow    7.1  9.4          EMG   Side Muscle  Nerve Root Ins Act Fibs Psw Amp Dur Poly Recrt Int Fraser Din Comment  Right Abd Poll Brev Median C8-T1 Nml Nml Nml Nml Nml 0 Nml Nml   Right 1stDorInt Ulnar C8-T1 Nml Nml Nml Nml Nml 0 Nml Nml   Right PronatorTeres Median C6-7 Nml Nml Nml Nml Nml 0 Nml Nml     Nerve Conduction Studies Anti Sensory Left/Right Comparison   Stim Site L Lat (ms) R Lat (ms) L-R Lat (ms) L Amp (V) R Amp (V) L-R Amp (%) Site1 Site2 L Vel (m/s) R Vel (m/s) L-R Vel (m/s)  Median Acr Palm Anti Sensory (2nd Digit)  30.6C  Wrist       Wrist Palm     Palm  *4.5   32.5        Radial Anti Sensory (Base 1st Digit)  31C  Wrist  2.3   26.7  Wrist Base 1st Digit     Ulnar Anti Sensory (5th Digit)  31.1C  Wrist  3.3   16.7  Wrist 5th Digit  42    Motor Left/Right Comparison   Stim Site L Lat (ms) R Lat (ms) L-R Lat (ms) L Amp (mV) R Amp (mV) L-R Amp (%) Site1 Site2 L Vel (m/s) R Vel (m/s) L-R Vel (m/s)  Median Motor (Abd Poll Brev)  31.6C  Wrist  *7.8   *1.8  Elbow Wrist  50   Elbow  11.8   1.3        Ulnar Motor (Abd Dig Min)  31.6C  Wrist  3.4   8.0  B Elbow Wrist  59   B Elbow  6.3   9.1  A Elbow B Elbow  125   A Elbow  7.1   9.4           Waveforms:            Clinical History: No specialty comments available.     Objective:  VS:  HT:    WT:   BMI:     BP:   HR: bpm  TEMP: ( )  RESP:  Physical Exam Musculoskeletal:        General: No swelling, tenderness or deformity.     Comments: Inspection reveals some flattening of the right APB but no atrophy of the bilateral APB or FDI or hand intrinsics. There is no swelling, color changes, allodynia or dystrophic changes. There is 5 out of 5 strength in the bilateral wrist extension, finger abduction and long finger flexion. There is decreased sensation to light touch in in the right median nerve distribution.  There is a negative Hoffmann's test bilaterally.  Skin:    General: Skin is warm and dry.     Findings: No erythema  or rash.  Neurological:      General: No focal deficit present.     Mental Status: She is alert and oriented to person, place, and time.     Motor: No weakness or abnormal muscle tone.     Coordination: Coordination normal.  Psychiatric:        Mood and Affect: Mood normal.        Behavior: Behavior normal.     Imaging: No results found.

## 2021-05-03 ENCOUNTER — Other Ambulatory Visit: Payer: Self-pay

## 2021-05-03 ENCOUNTER — Ambulatory Visit: Payer: PPO | Admitting: Occupational Therapy

## 2021-05-03 DIAGNOSIS — M6281 Muscle weakness (generalized): Secondary | ICD-10-CM

## 2021-05-03 DIAGNOSIS — R208 Other disturbances of skin sensation: Secondary | ICD-10-CM

## 2021-05-03 DIAGNOSIS — R600 Localized edema: Secondary | ICD-10-CM

## 2021-05-03 DIAGNOSIS — M25631 Stiffness of right wrist, not elsewhere classified: Secondary | ICD-10-CM

## 2021-05-03 DIAGNOSIS — M25641 Stiffness of right hand, not elsewhere classified: Secondary | ICD-10-CM

## 2021-05-03 NOTE — Therapy (Signed)
[facility] [address] [place], [place], [address] Phone: [phone]   Fax:  [phone]  Occupational Therapy Treatment  Patient Details  Name: Krystal Moore MRN: [id number] Date of Birth: 1930/04/08 Referring Provider (OT): [name], [place]   Encounter Date: 05/03/2021   OT End of Session - 05/03/21 1234     Visit Number 25    Number of Visits 31    Date for OT Re-Evaluation 05/16/21    Authorization Type HTA No auth - VL:[id number]    Authorization - Visit Number 25    Progress Note Due on Visit 30             Past Medical History:  Diagnosis Date   Anemia    Narcolepsy    Neuropathy    Obesity    Pneumonia    Polyneuropathy in other diseases classified elsewhere [facility])    Shoulder fracture, left     Past Surgical History:  Procedure Laterality Date   CATARACT EXTRACTION Left 06/2019   MOUTH SURGERY     NASAL SINUS SURGERY      There were no vitals filed for this visit.   Subjective Assessment - 05/03/21 1233     Subjective  Pt reports her hand really bothered her last night    Pertinent History Pt sustained a Comminuted distal radial fracture with extension to the radiocarpal  joint on 11/28/20, pt was treated non-operatively    Currently in Pain? Yes    Pain Score 6     Pain Location Hand    Pain Orientation Right    Pain Descriptors / Indicators Numbness;Pins and needles    Pain Type Acute pain    Pain Onset More than a month ago    Pain Frequency Constant    Aggravating Factors  unknown    Pain Relieving Factors heat, stretches, gummies                  Treatment:fluidotherapy x 12 mins for pain stiffness and hypersensitivity, no adverse reactions. Education provided regarding CTS and exercises issued. Placing and removing grooved pegs from pegboard with right hand with focus on neutral wrist and in hand manipulation, min difficulty/ v.c                 OT Education - 05/03/21 1301     Education Details reviewed conservative management of CTS handout, education regarding proper positioning and positions to avoid, issued exercises 1, 2, 5, 7 from CTS handout, pt performed 10 reps each, pt was instructed to discontinue putty for the time being, pt was instructed to wear wrist brace only during heavier activities    Person(s) Educated Patient    Methods Explanation;Demonstration;Verbal cues;Handout    Comprehension Verbalized understanding;Returned demonstration;Verbal cues required              OT Short Term Goals - 04/14/21 1503       OT SHORT TERM GOAL #1   Title Patient will demonstrate effective edema management techniques due 03/05/21    Time 4    Period Weeks    Status Achieved   edema glove issued however pt using inconsistently   Target Date 03/05/21      OT SHORT TERM GOAL #2   Title Patient will complete a home exercise program designed to improve active and passive range of motion in forearm, wrist, and digits    Time 4    Period Weeks    Status Achieved  OT SHORT TERM GOAL #3   Title Patient will demonstrate ability to contact pads 2-5  of fingers to palm to help facilitate gross grasp    Time 4    Period Weeks    Status Achieved      OT SHORT TERM GOAL #4   Title Patient will demonstrate ability to oppose thumb to 4th and 5th digit tips with min assist    Time 4    Period Weeks    Status Achieved   mod I     OT SHORT TERM GOAL #5   Title Patient will demonstrate 12 lb grip strength in RUE DUE 6/25    Time 4    Period Weeks    Status Achieved    Target Date 04/16/21               OT Long Term Goals - 04/14/21 1433       OT LONG TERM GOAL #1   Title Patient will complete HEP designed to improve functional strength and use of dominant RUE DUE 6/13    Time 8    Period Weeks    Status Achieved      OT LONG TERM GOAL #2   Title Patient will demonstrate 90% composite flexion and extension right  hand    Time 8    Period Weeks    Status Achieved      OT LONG TERM GOAL #3   Title Patient will demonstrate 10 lb increase in grip strength in right hand to help with functional grip and carry    Time 8    Period Weeks    Status Achieved      OT LONG TERM GOAL #4   Title Patient will demonstrate sufficient stamina and strength/range of motion in right hand 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    Time 8    Period Weeks    Status Achieved      OT LONG TERM GOAL #5   Title Patient will demonstrate at least 5 lb increase in pinch strength in right hand to aide with opening packages, holding small items    Time 8    Period Weeks    Status Achieved      OT LONG TERM GOAL #6   Title Patient will demonstrate improved wrist and forearm strength as evidenced by her ability to water plants with RUE without support of left hand due 7/25    Time 8    Period Weeks    Status On-going      OT LONG TERM GOAL #7   Title Patient will self report less discomfort and digit recoil when using power strike on keys in piano piece.    Time 8    Period Weeks    Status On-going      OT LONG TERM GOAL #8   Title Patient will demonstrate improved pincer grasp and bimanual skills to be able to zip choir robe in reasonable timeframe.    Time 8    Period Weeks    Status Achieved      OT LONG TERM GOAL  #9   TITLE Patient will improve grip strength to at least 20 lbs RUE    Time 8    Period Weeks    Status On-going      OT LONG TERM GOAL  #10   TITLE Patient will report improved speed and flexibility with complicated piano piece Loetta Rough or Butler- per patient)  Time 8    Period Weeks    Status On-going                   Plan - 05/03/21 1242     Clinical Impression Statement Pt reports that her hand really bothered her last night. Pt's EMG showed compression at median nerve. Pt is progressing slowly towards goals limited by pain.    OT Occupational Profile and History  Problem Focused Assessment - Including review of records relating to presenting problem    Occupational performance deficits (Please refer to evaluation for details): ADL's;IADL's;Work;Leisure    Body Structure / Function / Physical Skills ADL;Coordination;UE functional use;Sensation;Fascial restriction;Decreased knowledge of precautions;Decreased knowledge of use of DME;Flexibility;IADL;Pain;Skin integrity;Strength;FMC;Dexterity;Edema;Mobility;ROM    Rehab Potential Good    Clinical Decision Making Limited treatment options, no task modification necessary    Comorbidities Affecting Occupational Performance: None    Modification or Assistance to Complete Evaluation  No modification of tasks or assist necessary to complete eval    OT Frequency 2x / week    OT Duration 8 weeks    OT Treatment/Interventions Self-care/ADL training;Therapeutic exercise;Patient/family education;Splinting;Compression bandaging;Moist Heat;Paraffin;Fluidtherapy;Therapeutic activities;Cryotherapy;Ultrasound;Contrast Bath;DME and/or AE instruction;Manual Therapy;Passive range of motion    Plan continue fluidotherapy or paraffin, tendon gliding, review exercises from conservative management of CTS  gentle functional use, therapist recommended pt stops using putty for the time being due to new diagnosis    Consulted and Agree with Plan of Care Patient             Patient will benefit from skilled therapeutic intervention in order to improve the following deficits and impairments:   Body Structure / Function / Physical Skills: ADL, Coordination, UE functional use, Sensation, Fascial restriction, Decreased knowledge of precautions, Decreased knowledge of use of DME, Flexibility, IADL, Pain, Skin integrity, Strength, FMC, Dexterity, Edema, Mobility, ROM       Visit Diagnosis: Stiffness of right wrist, not elsewhere classified  Stiffness of right hand, not elsewhere classified  Other disturbances of skin  sensation  Muscle weakness (generalized)  Localized edema    Problem List Patient Active Problem List   Diagnosis Date Noted   Numbness 07/30/2019   Spinal stenosis of lumbar region 07/30/2019   Osteopenia after menopause 05/29/2019   Over weight 05/29/2019   Elevated blood pressure reading 05/29/2019   Immunization due 08/03/2017   Left leg swelling 05/04/2017   Allergic rhinitis 10/11/2015   Hypothyroidism 12/10/2013   Peripheral neuropathy 05/16/2013    Jakeob Tullis 05/03/2021, 1:10 PM Theone Murdoch, OTR/L [phone] Phone: [phone] 1:27 PM 05/03/21  [facility] [address] [place], [place], [address] Phone: [phone]   Fax:  [phone]  Name: Elowen Debruyn MRN: [id number] Date of Birth: 11/17/1929

## 2021-05-05 ENCOUNTER — Encounter: Payer: Self-pay | Admitting: Occupational Therapy

## 2021-05-05 ENCOUNTER — Other Ambulatory Visit: Payer: Self-pay

## 2021-05-05 ENCOUNTER — Ambulatory Visit: Payer: PPO | Admitting: Occupational Therapy

## 2021-05-05 DIAGNOSIS — R600 Localized edema: Secondary | ICD-10-CM

## 2021-05-05 DIAGNOSIS — M25631 Stiffness of right wrist, not elsewhere classified: Secondary | ICD-10-CM | POA: Diagnosis not present

## 2021-05-05 DIAGNOSIS — M25641 Stiffness of right hand, not elsewhere classified: Secondary | ICD-10-CM

## 2021-05-05 DIAGNOSIS — M6281 Muscle weakness (generalized): Secondary | ICD-10-CM

## 2021-05-05 DIAGNOSIS — R208 Other disturbances of skin sensation: Secondary | ICD-10-CM

## 2021-05-05 NOTE — Therapy (Signed)
[facility] [address] [place], [place], [address] Phone: [phone]   Fax:  [phone]  Occupational Therapy Treatment  Patient Details  Name: Krystal Moore MRN: [id number] Date of Birth: 1930-03-27 Referring Provider (OT): [name], [place]   Encounter Date: 05/05/2021   OT End of Session - 05/05/21 1523     Visit Number 26    Number of Visits 31    Date for OT Re-Evaluation 05/16/21    Authorization Type HTA No auth - VL:[id number]    Progress Note Due on Visit 58    OT Start Time 1323    OT Stop Time 1405    OT Time Calculation (min) 42 min    Equipment Utilized During Treatment fluidotherapy    Activity Tolerance Patient tolerated treatment well    Behavior During Therapy WFL for tasks assessed/performed             Past Medical History:  Diagnosis Date   Anemia    Narcolepsy    Neuropathy    Obesity    Pneumonia    Polyneuropathy in other diseases classified elsewhere [facility])    Shoulder fracture, left     Past Surgical History:  Procedure Laterality Date   CATARACT EXTRACTION Left 06/2019   MOUTH SURGERY     NASAL SINUS SURGERY      There were no vitals filed for this visit.   Subjective Assessment - 05/05/21 1519     Subjective  Pain is a little better today - I took a CBD gummy last night    Pertinent History Pt sustained a Comminuted distal radial fracture with extension to the radiocarpal  joint on 11/28/20, pt was treated non-operatively    Currently in Pain? Yes    Pain Score 3     Pain Location Hand    Pain Orientation Right    Pain Descriptors / Indicators Pins and needles    Pain Type Acute pain    Pain Onset More than a month ago    Aggravating Factors  contact    Pain Relieving Factors Heat, stretch, gummies                          OT Treatments/Exercises (OP) - 05/05/21 0001       ADLs   ADL Comments Talked at length about recent diagnosis of  median nerve entrapment.  She is anxious regarding appointment tomorrow with ortho.  Is aware that surgery may be an option that is discuused.      Hand Exercises   Other Hand Exercises Reviewed median nerve glides, and provided passive stretch to index MCP/PIP.  Patient reporting some relief of symptoms with gentle exercise.  Patient has stopped using putty since dx of median nerve entrapment.      RUE Fluidotherapy   Number Minutes Fluidotherapy 10 Minutes    RUE Fluidotherapy Location Hand;Wrist;Forearm    Comments prior to stretching, pain relief                      OT Short Term Goals - 05/05/21 1527       OT SHORT TERM GOAL #1   Title Patient will demonstrate effective edema management techniques due 03/05/21    Time 4    Period Weeks    Status Achieved   edema glove issued however pt using inconsistently   Target Date 03/05/21      OT  SHORT TERM GOAL #2   Title Patient will complete a home exercise program designed to improve active and passive range of motion in forearm, wrist, and digits    Time 4    Period Weeks    Status Achieved      OT SHORT TERM GOAL #3   Title Patient will demonstrate ability to contact pads 2-5  of fingers to palm to help facilitate gross grasp    Time 4    Period Weeks    Status Achieved      OT SHORT TERM GOAL #4   Title Patient will demonstrate ability to oppose thumb to 4th and 5th digit tips with min assist    Time 4    Period Weeks    Status Achieved   mod I     OT SHORT TERM GOAL #5   Title Patient will demonstrate 12 lb grip strength in RUE DUE 6/25    Time 4    Period Weeks    Status Achieved    Target Date 04/16/21               OT Long Term Goals - 05/05/21 1527       OT LONG TERM GOAL #1   Title Patient will complete HEP designed to improve functional strength and use of dominant RUE DUE 6/13    Time 8    Period Weeks    Status Achieved      OT LONG TERM GOAL #2   Title Patient will demonstrate  90% composite flexion and extension right hand    Time 8    Period Weeks    Status Achieved      OT LONG TERM GOAL #3   Title Patient will demonstrate 10 lb increase in grip strength in right hand to help with functional grip and carry    Time 8    Period Weeks    Status Achieved      OT LONG TERM GOAL #4   Title Patient will demonstrate sufficient stamina and strength/range of motion in right hand 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    Time 8    Period Weeks    Status Achieved      OT LONG TERM GOAL #5   Title Patient will demonstrate at least 5 lb increase in pinch strength in right hand to aide with opening packages, holding small items    Time 8    Period Weeks    Status Achieved      OT LONG TERM GOAL #6   Title Patient will demonstrate improved wrist and forearm strength as evidenced by her ability to water plants with RUE without support of left hand due 7/25    Time 8    Period Weeks    Status On-going      OT LONG TERM GOAL #7   Title Patient will self report less discomfort and digit recoil when using power strike on keys in piano piece.    Time 8    Period Weeks    Status On-going      OT LONG TERM GOAL #8   Title Patient will demonstrate improved pincer grasp and bimanual skills to be able to zip choir robe in reasonable timeframe.    Time 8    Period Weeks    Status Achieved      OT LONG TERM GOAL  #9   TITLE Patient will improve grip strength to at least 20  lbs RUE    Time 8    Period Weeks    Status On-going      OT LONG TERM GOAL  #10   TITLE Patient will report improved speed and flexibility with complicated piano piece Loetta Rough or Chopin- per patient)    Time 8    Period Weeks    Status On-going                   Plan - 05/05/21 1526     Clinical Impression Statement Pt is progressing slowly towards goals limited by pain.  Has reported improvement in symptoms with pain management (CBD) and reducing power grip exercise.     OT Occupational Profile and History Problem Focused Assessment - Including review of records relating to presenting problem    Occupational performance deficits (Please refer to evaluation for details): ADL's;IADL's;Work;Leisure    Body Structure / Function / Physical Skills ADL;Coordination;UE functional use;Sensation;Fascial restriction;Decreased knowledge of precautions;Decreased knowledge of use of DME;Flexibility;IADL;Pain;Skin integrity;Strength;FMC;Dexterity;Edema;Mobility;ROM    Rehab Potential Good    Clinical Decision Making Limited treatment options, no task modification necessary    Comorbidities Affecting Occupational Performance: None    Modification or Assistance to Complete Evaluation  No modification of tasks or assist necessary to complete eval    OT Frequency 2x / week    OT Duration 8 weeks    OT Treatment/Interventions Self-care/ADL training;Therapeutic exercise;Patient/family education;Splinting;Compression bandaging;Moist Heat;Paraffin;Fluidtherapy;Therapeutic activities;Cryotherapy;Ultrasound;Contrast Bath;DME and/or AE instruction;Manual Therapy;Passive range of motion    Plan continue fluidotherapy or paraffin, tendon gliding, review exercises from conservative management of CTS  gentle functional use, therapist recommended pt stops using putty for the time being due to new diagnosis    Consulted and Agree with Plan of Care Patient             Patient will benefit from skilled therapeutic intervention in order to improve the following deficits and impairments:   Body Structure / Function / Physical Skills: ADL, Coordination, UE functional use, Sensation, Fascial restriction, Decreased knowledge of precautions, Decreased knowledge of use of DME, Flexibility, IADL, Pain, Skin integrity, Strength, FMC, Dexterity, Edema, Mobility, ROM       Visit Diagnosis: Stiffness of right wrist, not elsewhere classified  Stiffness of right hand, not elsewhere  classified  Other disturbances of skin sensation  Muscle weakness (generalized)  Localized edema    Problem List Patient Active Problem List   Diagnosis Date Noted   Numbness 07/30/2019   Spinal stenosis of lumbar region 07/30/2019   Osteopenia after menopause 05/29/2019   Over weight 05/29/2019   Elevated blood pressure reading 05/29/2019   Immunization due 08/03/2017   Left leg swelling 05/04/2017   Allergic rhinitis 10/11/2015   Hypothyroidism 12/10/2013   Peripheral neuropathy 05/16/2013    Mariah Milling, OTR/L 05/05/2021, 3:28 PM  [facility] [address] [place], [place], [address] Phone: [phone]   Fax:  [phone]  Name: Krystal Moore MRN: [id number] Date of Birth: 10/19/1930

## 2021-05-06 ENCOUNTER — Ambulatory Visit (INDEPENDENT_AMBULATORY_CARE_PROVIDER_SITE_OTHER): Payer: PPO | Admitting: Orthopaedic Surgery

## 2021-05-06 ENCOUNTER — Encounter: Payer: Self-pay | Admitting: Orthopaedic Surgery

## 2021-05-06 DIAGNOSIS — G5601 Carpal tunnel syndrome, right upper limb: Secondary | ICD-10-CM

## 2021-05-06 NOTE — Progress Notes (Signed)
   Office Visit Note   Patient: Krystal Moore           Date of Birth: 26-Dec-1929           MRN: [id number] Visit Date: 05/06/2021              Requested by: Ladell Pier, MD [address] [place],  [place] [address] PCP: Ladell Pier, MD   Assessment & Plan: Visit Diagnoses:  1. Right carpal tunnel syndrome     Plan: Nerve conduction studies reveal severe right carpal tunnel syndrome.  These findings were explained to the patient and recommendation has been made for right carpal tunnel release in the near future.  Risk benefits rehab recovery all reviewed with the patient detail.  Questions encouraged and answered.  Follow-Up Instructions: No follow-ups on file.   Orders:  No orders of the defined types were placed in this encounter.  No orders of the defined types were placed in this encounter.     Procedures: No procedures performed   Clinical Data: No additional findings.   Subjective: Chief Complaint  Patient presents with   Right Hand - Pain, Follow-up    [name] returns today for review of recent nerve conduction studies that showed severe right carpal tunnel syndrome.  Denies any changes in her symptoms.  She continues to have constant pain and numbness and burning and difficulty with sleeping and playing her piano.   Review of Systems   Objective: Vital Signs: There were no vitals taken for this visit.  Physical Exam  Ortho Exam Right hand exam is unchanged.  She has flattening of the thenar compartment. Specialty Comments:  No specialty comments available.  Imaging: No results found.   PMFS History: Patient Active Problem List   Diagnosis Date Noted   Numbness 07/30/2019   Spinal stenosis of lumbar region 07/30/2019   Osteopenia after menopause 05/29/2019   Over weight 05/29/2019   Elevated blood pressure reading 05/29/2019   Immunization due 08/03/2017   Left leg swelling 05/04/2017   Allergic rhinitis 10/11/2015    Hypothyroidism 12/10/2013   Peripheral neuropathy 05/16/2013   Past Medical History:  Diagnosis Date   Anemia    Narcolepsy    Neuropathy    Obesity    Pneumonia    Polyneuropathy in other diseases classified elsewhere [facility])    Shoulder fracture, left     Family History  Problem Relation Age of Onset   Stroke Mother        questionable   Clotting disorder Mother    Heart attack Father     Past Surgical History:  Procedure Laterality Date   CATARACT EXTRACTION Left 06/2019   MOUTH SURGERY     NASAL SINUS SURGERY     Social History   Occupational History   Not on file  Tobacco Use   Smoking status: Never   Smokeless tobacco: Never  Substance and Sexual Activity   Alcohol use: Yes    Comment: 1/4 glass per day   Drug use: No   Sexual activity: Not Currently

## 2021-05-10 ENCOUNTER — Encounter: Payer: Self-pay | Admitting: Occupational Therapy

## 2021-05-10 ENCOUNTER — Ambulatory Visit: Payer: PPO | Admitting: Occupational Therapy

## 2021-05-10 ENCOUNTER — Other Ambulatory Visit: Payer: Self-pay

## 2021-05-10 DIAGNOSIS — M25641 Stiffness of right hand, not elsewhere classified: Secondary | ICD-10-CM

## 2021-05-10 DIAGNOSIS — R600 Localized edema: Secondary | ICD-10-CM

## 2021-05-10 DIAGNOSIS — M25631 Stiffness of right wrist, not elsewhere classified: Secondary | ICD-10-CM

## 2021-05-10 DIAGNOSIS — M6281 Muscle weakness (generalized): Secondary | ICD-10-CM

## 2021-05-10 DIAGNOSIS — R208 Other disturbances of skin sensation: Secondary | ICD-10-CM

## 2021-05-10 NOTE — Therapy (Signed)
[facility] [address] [place], [place], [address] Phone: [phone]   Fax:  [phone]  Occupational Therapy Treatment  Patient Details  Name: Krystal Moore MRN: [id number] Date of Birth: Mar 07, 1930 Referring Provider (OT): [name], [place]   Encounter Date: 05/10/2021   OT End of Session - 05/10/21 1406     Visit Number 27    Number of Visits 31    Date for OT Re-Evaluation 05/16/21    Authorization Type HTA No auth - VL:[id number]    Progress Note Due on Visit 66    OT Start Time 15    OT Stop Time 1310    OT Time Calculation (min) 30 min             Past Medical History:  Diagnosis Date   Anemia    Narcolepsy    Neuropathy    Obesity    Pneumonia    Polyneuropathy in other diseases classified elsewhere [facility])    Shoulder fracture, left     Past Surgical History:  Procedure Laterality Date   CATARACT EXTRACTION Left 06/2019   MOUTH SURGERY     NASAL SINUS SURGERY      There were no vitals filed for this visit.   Subjective Assessment - 05/10/21 1403     Subjective  It is getting worse.    Pertinent History Pt sustained a Comminuted distal radial fracture with extension to the radiocarpal  joint on 11/28/20, pt was treated non-operatively    Currently in Pain? Yes    Pain Location Hand    Pain Orientation Right    Pain Descriptors / Indicators Burning;Pins and needles    Pain Type Chronic pain    Pain Onset More than a month ago    Pain Frequency Constant    Aggravating Factors  contact    Pain Relieving Factors heat, stretch, gummies                          OT Treatments/Exercises (OP) - 05/10/21 0001       ADLs   ADL Comments Patient very empotional today. Hand painful and concerned regarding potential Carpal Tunnel Surgery,  Reinforced progress that patient has made, and that pain she describes is nerve pain, and that it may be best to pursue surgery  sooner rather than later.  Patient very tearful.  Calmer after discussion.  Hopes to obtain a second opinion.                      OT Short Term Goals - 05/10/21 1507       OT SHORT TERM GOAL #1   Title Patient will demonstrate effective edema management techniques due 03/05/21    Time 4    Period Weeks    Status Achieved   edema glove issued however pt using inconsistently   Target Date 03/05/21      OT SHORT TERM GOAL #2   Title Patient will complete a home exercise program designed to improve active and passive range of motion in forearm, wrist, and digits    Time 4    Period Weeks    Status Achieved      OT SHORT TERM GOAL #3   Title Patient will demonstrate ability to contact pads 2-5  of fingers to palm to help facilitate gross grasp    Time 4    Period Weeks    Status  Achieved      OT SHORT TERM GOAL #4   Title Patient will demonstrate ability to oppose thumb to 4th and 5th digit tips with min assist    Time 4    Period Weeks    Status Achieved   mod I     OT SHORT TERM GOAL #5   Title Patient will demonstrate 12 lb grip strength in RUE DUE 6/25    Time 4    Period Weeks    Status Achieved    Target Date 04/16/21               OT Long Term Goals - 05/10/21 1507       OT LONG TERM GOAL #1   Title Patient will complete HEP designed to improve functional strength and use of dominant RUE DUE 6/13    Time 8    Period Weeks    Status Achieved      OT LONG TERM GOAL #2   Title Patient will demonstrate 90% composite flexion and extension right hand    Time 8    Period Weeks    Status Achieved      OT LONG TERM GOAL #3   Title Patient will demonstrate 10 lb increase in grip strength in right hand to help with functional grip and carry    Time 8    Period Weeks    Status Achieved      OT LONG TERM GOAL #4   Title Patient will demonstrate sufficient stamina and strength/range of motion in right hand to 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    Time 8    Period Weeks    Status Achieved      OT LONG TERM GOAL #5   Title Patient will demonstrate at least 5 lb increase in pinch strength in right hand to aide with opening packages, holding small items    Time 8    Period Weeks    Status Achieved      OT LONG TERM GOAL #6   Title Patient will demonstrate improved wrist and forearm strength as evidenced by her ability to water plants with RUE without support of left hand due 7/25    Time 8    Period Weeks    Status On-going      OT LONG TERM GOAL #7   Title Patient will self report less discomfort and digit recoil when using power strike on keys in piano piece.    Time 8    Period Weeks    Status On-going      OT LONG TERM GOAL #8   Title Patient will demonstrate improved pincer grasp and bimanual skills to be able to zip choir robe in reasonable timeframe.    Time 8    Period Weeks    Status Achieved      OT LONG TERM GOAL  #9   TITLE Patient will improve grip strength to at least 20 lbs RUE    Time 8    Period Weeks    Status On-going      OT LONG TERM GOAL  #10   TITLE Patient will report improved speed and flexibility with complicated piano piece Loetta Rough or Chopin- per patient)    Time 8    Period Weeks    Status On-going                   Plan - 05/10/21 1407  Clinical Impression Statement Pt is likely facing carpal tunnel surgery.  Plan to discharge as scheduled and reassess following surgery if indicated.  Patient aware.    OT Occupational Profile and History Problem Focused Assessment - Including review of records relating to presenting problem    Occupational performance deficits (Please refer to evaluation for details): ADL's;IADL's;Work;Leisure    Body Structure / Function / Physical Skills ADL;Coordination;UE functional use;Sensation;Fascial restriction;Decreased knowledge of precautions;Decreased knowledge of use of DME;Flexibility;IADL;Pain;Skin  integrity;Strength;FMC;Dexterity;Edema;Mobility;ROM    Rehab Potential Good    Clinical Decision Making Limited treatment options, no task modification necessary    Comorbidities Affecting Occupational Performance: None    Modification or Assistance to Complete Evaluation  No modification of tasks or assist necessary to complete eval    OT Frequency 2x / week    OT Duration 8 weeks    OT Treatment/Interventions Self-care/ADL training;Therapeutic exercise;Patient/family education;Splinting;Compression bandaging;Moist Heat;Paraffin;Fluidtherapy;Therapeutic activities;Cryotherapy;Ultrasound;Contrast Bath;DME and/or AE instruction;Manual Therapy;Passive range of motion    Plan review remaining goals and discharge    Consulted and Agree with Plan of Care Patient             Patient will benefit from skilled therapeutic intervention in order to improve the following deficits and impairments:   Body Structure / Function / Physical Skills: ADL, Coordination, UE functional use, Sensation, Fascial restriction, Decreased knowledge of precautions, Decreased knowledge of use of DME, Flexibility, IADL, Pain, Skin integrity, Strength, FMC, Dexterity, Edema, Mobility, ROM       Visit Diagnosis: Stiffness of right hand, not elsewhere classified  Stiffness of right wrist, not elsewhere classified  Other disturbances of skin sensation  Muscle weakness (generalized)  Localized edema    Problem List Patient Active Problem List   Diagnosis Date Noted   Numbness 07/30/2019   Spinal stenosis of lumbar region 07/30/2019   Osteopenia after menopause 05/29/2019   Over weight 05/29/2019   Elevated blood pressure reading 05/29/2019   Immunization due 08/03/2017   Left leg swelling 05/04/2017   Allergic rhinitis 10/11/2015   Hypothyroidism 12/10/2013   Peripheral neuropathy 05/16/2013    Mariah Milling 05/10/2021, 3:08 PM  [facility] [address] [place], [place], [address] Phone: [phone]   Fax:  [phone]  Name: Selen Smucker MRN: [id number] Date of Birth: [birth year]

## 2021-05-12 ENCOUNTER — Other Ambulatory Visit: Payer: Self-pay

## 2021-05-12 ENCOUNTER — Ambulatory Visit: Payer: PPO | Admitting: Occupational Therapy

## 2021-05-12 ENCOUNTER — Encounter: Payer: Self-pay | Admitting: Occupational Therapy

## 2021-05-12 DIAGNOSIS — M25631 Stiffness of right wrist, not elsewhere classified: Secondary | ICD-10-CM

## 2021-05-12 DIAGNOSIS — R600 Localized edema: Secondary | ICD-10-CM

## 2021-05-12 DIAGNOSIS — M25641 Stiffness of right hand, not elsewhere classified: Secondary | ICD-10-CM

## 2021-05-12 DIAGNOSIS — R208 Other disturbances of skin sensation: Secondary | ICD-10-CM

## 2021-05-12 DIAGNOSIS — M6281 Muscle weakness (generalized): Secondary | ICD-10-CM

## 2021-05-12 NOTE — Therapy (Signed)
[facility] [address] [place], [place], [address] Phone: [phone]   Fax:  [phone]  Occupational Therapy Treatment and Discharge Summary  Patient Details  Name: Krystal Moore MRN: [id number] Date of Birth: 1930-01-03 Referring Provider (OT): [name], [place]   Encounter Date: 05/12/2021   OT End of Session - 05/12/21 1319     Visit Number 28    Number of Visits 31    Date for OT Re-Evaluation 05/16/21    Authorization Type HTA No auth - [id number]    Authorization - Visit Number 28    Progress Note Due on Visit 45    OT Start Time 7353    OT Stop Time 1300    OT Time Calculation (min) 25 min    Activity Tolerance Patient tolerated treatment well    Behavior During Therapy [facility] for tasks assessed/performed             Past Medical History:  Diagnosis Date   Anemia    Narcolepsy    Neuropathy    Obesity    Pneumonia    Polyneuropathy in other diseases classified elsewhere [facility])    Shoulder fracture, left     Past Surgical History:  Procedure Laterality Date   CATARACT EXTRACTION Left 06/2019   MOUTH SURGERY     NASAL SINUS SURGERY      There were no vitals filed for this visit.                 OT Treatments/Exercises (OP) - 05/12/21 0001       ADLs   ADL Comments Reviewed remaining goals relating to discharge. Patient planning to meet with surgeon to determine next steps. Patient has met all goals with the exception of goal relating to nreve pain during piano playing.                    OT Education - 05/12/21 1318     Education Details Reviewed progress overall and plan to discharge.    Person(s) Educated Patient    Methods Explanation    Comprehension Verbalized understanding              OT Short Term Goals - 05/12/21 1500       OT SHORT TERM GOAL #1   Title Patient will demonstrate effective edema management techniques due 03/05/21     Time 4    Period Weeks    Status Achieved   edema glove issued however pt using inconsistently   Target Date 03/05/21      OT SHORT TERM GOAL #2   Title Patient will complete a home exercise program designed to improve active and passive range of motion in forearm, wrist, and digits    Time 4    Period Weeks    Status Achieved      OT SHORT TERM GOAL #3   Title Patient will demonstrate ability to contact pads 2-5  of fingers to palm to help facilitate gross grasp    Time 4    Period Weeks    Status Achieved      OT SHORT TERM GOAL #4   Title Patient will demonstrate ability to oppose thumb to 4th and 5th digit tips with min assist    Time 4    Period Weeks    Status Achieved   mod I     OT SHORT TERM GOAL #5   Title Patient will demonstrate 12 lb  grip strength in RUE DUE 6/25    Time 4    Period Weeks    Status Achieved    Target Date 04/16/21               OT Long Term Goals - 05/12/21 1246       OT LONG TERM GOAL #1   Title Patient will complete HEP designed to improve functional strength and use of dominant RUE DUE 6/13    Time 8    Period Weeks    Status Achieved      OT LONG TERM GOAL #2   Title Patient will demonstrate 90% composite flexion and extension right hand    Time 8    Period Weeks    Status Achieved      OT LONG TERM GOAL #3   Title Patient will demonstrate 10 lb increase in grip strength in right hand to help with functional grip and carry    Time 8    Period Weeks    Status Achieved      OT LONG TERM GOAL #4   Title Patient will demonstrate sufficient stamina and strength/range of motion in right hand 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    Time 8    Period Weeks    Status Achieved      OT LONG TERM GOAL #5   Title Patient will demonstrate at least 5 lb increase in pinch strength in right hand to aide with opening packages, holding small items    Time 8    Period Weeks    Status Achieved      OT LONG TERM GOAL #6    Title Patient will demonstrate improved wrist and forearm strength as evidenced by her ability to water plants with RUE without support of left hand due 7/25    Time 8    Period Weeks    Status Achieved      OT LONG TERM GOAL #7   Title Patient will self report less discomfort and digit recoil when using power strike on keys in piano piece.    Status Not Met      OT LONG TERM GOAL #8   Title Patient will demonstrate improved pincer grasp and bimanual skills to be able to zip choir robe in reasonable timeframe.    Time 8    Period Weeks    Status Achieved      OT LONG TERM GOAL  #9   TITLE Patient will improve grip strength to at least 20 lbs RUE    Time 8    Period Weeks    Status Achieved      OT LONG TERM GOAL  #10   TITLE Patient will report improved speed and flexibility with complicated piano piece Loetta Rough or [place]- per patient)    Time 8    Status Achieved                   Plan - 05/12/21 1319     Clinical Impression Statement Pt has met goals for OT and is agreeable to OT discharge.  Anticipate she will need additional OT following carpal tunnel repair.    OT Occupational Profile and History Problem Focused Assessment - Including review of records relating to presenting problem    Occupational performance deficits (Please refer to evaluation for details): ADL's;IADL's;Work;Leisure    Body Structure / Function / Physical Skills ADL;Coordination;UE functional use;Sensation;Fascial restriction;Decreased knowledge of precautions;Decreased knowledge of use of  DME;Flexibility;IADL;Pain;Skin integrity;Strength;FMC;Dexterity;Edema;Mobility;ROM    Rehab Potential Good    Clinical Decision Making Limited treatment options, no task modification necessary    Comorbidities Affecting Occupational Performance: None    Modification or Assistance to Complete Evaluation  No modification of tasks or assist necessary to complete eval    OT Frequency 2x / week    OT Duration 8  weeks    OT Treatment/Interventions Self-care/ADL training;Therapeutic exercise;Patient/family education;Splinting;Compression bandaging;Moist Heat;Paraffin;Fluidtherapy;Therapeutic activities;Cryotherapy;Ultrasound;Contrast Bath;DME and/or AE instruction;Manual Therapy;Passive range of motion    Plan discharge    Consulted and Agree with Plan of Care Patient             Patient will benefit from skilled therapeutic intervention in order to improve the following deficits and impairments:   Body Structure / Function / Physical Skills: ADL, Coordination, UE functional use, Sensation, Fascial restriction, Decreased knowledge of precautions, Decreased knowledge of use of DME, Flexibility, IADL, Pain, Skin integrity, Strength, FMC, Dexterity, Edema, Mobility, ROM       Visit Diagnosis: Stiffness of right hand, not elsewhere classified  Stiffness of right wrist, not elsewhere classified  Other disturbances of skin sensation  Muscle weakness (generalized)  Localized edema    Problem List Patient Active Problem List   Diagnosis Date Noted   Numbness 07/30/2019   Spinal stenosis of lumbar region 07/30/2019   Osteopenia after menopause 05/29/2019   Over weight 05/29/2019   Elevated blood pressure reading 05/29/2019   Immunization due 08/03/2017   Left leg swelling 05/04/2017   Allergic rhinitis 10/11/2015   Hypothyroidism 12/10/2013   Peripheral neuropathy 05/16/2013  OCCUPATIONAL THERAPY DISCHARGE SUMMARY  Visits from Start of Care: 28  Current functional level related to goals / functional outcomes: Improved functional use, range of motion and strength in dominant RUE   Remaining deficits: Nerve pain, digit stiffness   Education / Equipment: HEP, Edema management   Patient agrees to discharge. Patient goals were met. Patient is being discharged due to meeting the stated rehab goals.Krystal Moore, OTR/L 05/12/2021, 3:02 PM  [facility] [address] [place], [place], [address] Phone: [phone]   Fax:  [phone]  Name: Krystal Moore MRN: [id number] Date of Birth: [birth year]

## 2021-06-05 ENCOUNTER — Other Ambulatory Visit: Payer: Self-pay | Admitting: Internal Medicine

## 2021-06-05 ENCOUNTER — Other Ambulatory Visit: Payer: Self-pay | Admitting: Family Medicine

## 2021-06-05 DIAGNOSIS — R600 Localized edema: Secondary | ICD-10-CM

## 2021-06-05 DIAGNOSIS — E039 Hypothyroidism, unspecified: Secondary | ICD-10-CM

## 2021-06-05 NOTE — Telephone Encounter (Signed)
last RF 02/10/21 #30 6 RF

## 2021-06-05 NOTE — Telephone Encounter (Signed)
last RF 03/25/21 #90

## 2021-06-06 ENCOUNTER — Other Ambulatory Visit: Payer: Self-pay | Admitting: Family Medicine

## 2021-06-06 DIAGNOSIS — E039 Hypothyroidism, unspecified: Secondary | ICD-10-CM

## 2021-06-06 NOTE — Telephone Encounter (Signed)
Per [facility] patient pick up 90 day supply on 05/09/2021

## 2021-07-27 ENCOUNTER — Encounter (HOSPITAL_COMMUNITY): Payer: Self-pay | Admitting: Oncology

## 2021-07-27 ENCOUNTER — Emergency Department (HOSPITAL_COMMUNITY)
Admission: EM | Admit: 2021-07-27 | Discharge: 2021-07-27 | Disposition: A | Discharge status: Home / Self Care | Payer: PPO | Attending: Emergency Medicine | Admitting: Emergency Medicine

## 2021-07-27 ENCOUNTER — Telehealth (HOSPITAL_COMMUNITY): Payer: Self-pay | Admitting: Emergency Medicine

## 2021-07-27 ENCOUNTER — Emergency Department (HOSPITAL_COMMUNITY): Payer: PPO

## 2021-07-27 ENCOUNTER — Other Ambulatory Visit: Payer: Self-pay

## 2021-07-27 DIAGNOSIS — M545 Low back pain, unspecified: Secondary | ICD-10-CM

## 2021-07-27 DIAGNOSIS — Z79899 Other long term (current) drug therapy: Secondary | ICD-10-CM | POA: Diagnosis not present

## 2021-07-27 DIAGNOSIS — M25552 Pain in left hip: Secondary | ICD-10-CM | POA: Diagnosis not present

## 2021-07-27 DIAGNOSIS — M79652 Pain in left thigh: Secondary | ICD-10-CM | POA: Insufficient documentation

## 2021-07-27 DIAGNOSIS — E039 Hypothyroidism, unspecified: Secondary | ICD-10-CM | POA: Diagnosis not present

## 2021-07-27 MED ORDER — TRAMADOL HCL 50 MG PO TABS: 50.0000 mg | ORAL_TABLET | Freq: Four times a day (QID) | ORAL | 0 refills | Status: DC | PRN

## 2021-07-27 MED ORDER — ACETAMINOPHEN 325 MG PO TABS
650.0000 mg | ORAL_TABLET | Freq: Once | ORAL | Status: AC
  Administered 2021-07-27: 650 mg via ORAL
  Filled 2021-07-27: qty 2

## 2021-07-27 MED ORDER — TIZANIDINE HCL 4 MG PO TABS
2.0000 mg | ORAL_TABLET | Freq: Once | ORAL | Status: AC
  Administered 2021-07-27: 2 mg via ORAL
  Filled 2021-07-27: qty 1

## 2021-07-27 MED ORDER — TRAMADOL HCL 50 MG PO TABS
50.0000 mg | ORAL_TABLET | Freq: Once | ORAL | Status: AC
  Administered 2021-07-27: 50 mg via ORAL
  Filled 2021-07-27: qty 1

## 2021-07-27 MED ORDER — TIZANIDINE HCL 4 MG PO TABS: 2.0000 mg | ORAL_TABLET | Freq: Four times a day (QID) | ORAL | 0 refills | Status: DC | PRN
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
## 2021-07-27 NOTE — Discharge Instructions (Signed)
Call your primary care doctor or specialist as discussed in the next 2-3 days.   Return immediately back to the ER if:  Your symptoms worsen within the next 12-24 hours. You develop new symptoms such as new fevers, persistent vomiting, new pain, shortness of breath, or new weakness or numbness, or if you have any other concerns.  

## 2021-07-28 ENCOUNTER — Ambulatory Visit: Payer: Self-pay | Admitting: *Deleted
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
## 2021-07-29 ENCOUNTER — Other Ambulatory Visit: Payer: Self-pay | Admitting: Internal Medicine

## 2021-07-29 NOTE — Telephone Encounter (Signed)
Medication Refill - Medication: Tizanidine, Tramadol   Has the patient contacted their pharmacy? No. Pt states that she is needing to have a refill and received these from hospital. She states that the bottle states that she needs to contact PCP. Please advise. (Agent: If no, request that the patient contact the pharmacy for the refill.) (Agent: If yes, when and what did the pharmacy advise?)  Preferred Pharmacy (with phone number or street name):  [facility] [phone] - [place], [place] [address]  [place] [address]  Phone: [phone] Fax: [phone]  Hours: Not open 24 hours   Has the patient been seen for an appointment in the last year OR does the patient have an upcoming appointment? Yes.    Agent: Please be advised that RX refills may take up to 3 business days. We ask that you follow-up with your pharmacy.

## 2021-07-29 NOTE — Telephone Encounter (Signed)
Patient reminded of appt with Dr. [name] next week.   Patient is request refills on Tramadol and Tizanidine be sent to pharmacy listed.   Advised Tylenol, Advil, and heat as tolerable.   Please advise.

## 2021-07-30 MED ORDER — TRAMADOL HCL 50 MG PO TABS: 50.0000 mg | ORAL_TABLET | Freq: Three times a day (TID) | ORAL | 0 refills | Status: DC | PRN

## 2021-07-30 NOTE — Addendum Note (Signed)
Addended by: Karle Plumber B on: 07/30/2021 01:13 PM   Modules accepted: Orders

## 2021-07-30 NOTE — Telephone Encounter (Signed)
Requested medication (s) are due for refill today: yes  Requested medication (s) are on the active medication list: yes  Last refill:  07/27/21  for both meds  Future visit scheduled: yes  Notes to clinic:  neither med delegated to NT- Pt was prescribed these 2 meds at [facility] ED bu Dr. Almyra Free. Pt stated pill bottle advised for pt to call PCP for further refills.   Requested Prescriptions  Pending Prescriptions Disp Refills   tiZANidine (ZANAFLEX) 4 MG tablet 6 tablet 0    Sig: Take 0.5 tablets (2 mg total) by mouth every 6 (six) hours as needed for up to 12 doses for muscle spasms.     Not Delegated - Cardiovascular:  Alpha-2 Agonists - tizanidine Failed - 07/29/2021 10:40 AM      Failed - This refill cannot be delegated      Passed - Valid encounter within last 6 months    Recent Outpatient Visits           5 months ago Hypothyroidism, unspecified type   [place], Enobong, MD   7 months ago Hospital discharge follow-up   [place] Gildardo Pounds, NP   1 year ago Acute pain of right shoulder   [place], Deborah B, MD   2 years ago Elevated blood pressure reading   [place], RPH-CPP   2 years ago Over weight   [place] Ladell Pier, MD       Future Appointments             In 5 days Ladell Pier, MD [place]             traMADol (ULTRAM) 50 MG tablet 12 tablet 0    Sig: Take 1 tablet (50 mg total) by mouth every 6 (six) hours as needed for up to 12 doses.     Not Delegated - Analgesics:  Opioid Agonists Failed - 07/29/2021 10:40 AM      Failed - This refill cannot be delegated      Failed - Urine Drug Screen completed in last 360 days      Passed - Valid encounter within last 6 months    Recent Outpatient Visits            5 months ago Hypothyroidism, unspecified type   [place], Enobong, MD   7 months ago Hospital discharge follow-up   [place], Zelda W, NP   1 year ago Acute pain of right shoulder   [place], MD   2 years ago Elevated blood pressure reading   [place], RPH-CPP   2 years ago Over weight   [place], MD       Future Appointments             In 5 days Ladell Pier, MD [place]

## 2021-07-30 NOTE — Telephone Encounter (Signed)
Message noted.  Limited refill sent on Tramadol to pt's pharmacy.

## 2021-08-04 ENCOUNTER — Encounter: Payer: Self-pay | Admitting: Internal Medicine

## 2021-08-04 ENCOUNTER — Other Ambulatory Visit: Payer: Self-pay

## 2021-08-04 ENCOUNTER — Ambulatory Visit: Payer: PPO | Attending: Internal Medicine | Admitting: Internal Medicine

## 2021-08-04 VITALS — BP 150/85 | HR 76 | Resp 16 | Wt 160.0 lb

## 2021-08-04 DIAGNOSIS — Z780 Asymptomatic menopausal state: Secondary | ICD-10-CM

## 2021-08-04 DIAGNOSIS — Z23 Encounter for immunization: Secondary | ICD-10-CM | POA: Diagnosis not present

## 2021-08-04 DIAGNOSIS — M8580 Other specified disorders of bone density and structure, unspecified site: Secondary | ICD-10-CM

## 2021-08-04 DIAGNOSIS — R269 Unspecified abnormalities of gait and mobility: Secondary | ICD-10-CM | POA: Diagnosis not present

## 2021-08-04 DIAGNOSIS — M5416 Radiculopathy, lumbar region: Secondary | ICD-10-CM | POA: Diagnosis not present

## 2021-08-04 DIAGNOSIS — E538 Deficiency of other specified B group vitamins: Secondary | ICD-10-CM

## 2021-08-04 DIAGNOSIS — I10 Essential (primary) hypertension: Secondary | ICD-10-CM | POA: Diagnosis not present

## 2021-08-04 DIAGNOSIS — E039 Hypothyroidism, unspecified: Secondary | ICD-10-CM

## 2021-08-04 MED ORDER — PREDNISONE 10 MG PO TABS: 10.0000 mg | ORAL_TABLET | Freq: Every day | ORAL | 0 refills | Status: DC

## 2021-08-04 MED ORDER — TRAMADOL HCL 50 MG PO TABS: 50.0000 mg | ORAL_TABLET | Freq: Three times a day (TID) | ORAL | 0 refills | Status: DC | PRN

## 2021-08-04 MED ORDER — AMLODIPINE BESYLATE 5 MG PO TABS: 5.0000 mg | ORAL_TABLET | Freq: Every day | ORAL | 3 refills | Status: DC

## 2021-08-04 MED ORDER — TIZANIDINE HCL 4 MG PO TABS: 4.0000 mg | ORAL_TABLET | Freq: Three times a day (TID) | ORAL | 0 refills | Status: DC | PRN

## 2021-08-04 NOTE — Progress Notes (Signed)
Patient ID: Krystal Moore, female    DOB: 1929-12-10  MRN: [id number]  CC: Hip Pain   Subjective: Krystal Moore is a 87 y.o. female who presents for hip pain Her concerns today include:  Hx of osteopenia on Fosamax, hypothyroid, obesity, Vit B12 def, RT CTS, OA cervical spine, lumbar spinal stenosis,   Patient presents as follow-up from the emergency room she was seen 07/27/2021 with complaint of left lower back spasming pain that started after she was accidentally hit in the left lower back by an open car door from another vehicle while she was loading groceries into her car 2 days prior. -Initially area was a little sore and it was bruised.  However pain and spasm became progressively worse to the point that she was unable to get out of her car when she arrived at church to go to choir practice so her friends at church she recommended that she be seen in the emergency room.  X-ray of the left hip was negative for fracture.  Pelvic ring was intact.  Degenerative changes of the lumbar spine noted.  Patient was discharged on a limited supply of tramadol and tizanidine.  She called [place] after ER visit to report that she was still in pain.  I had sent prescription over the weekend for limited refill on tramadol until she was seen today.  However she did not get that prescription.  She tells me that she had been taking the tramadol and tizanidine every 4 hours because she could not stand the pain.  Denies any drowsiness or constipation with the medication thus far.  She finds the tizanidine more helpful than tramadol.  She took an Advil last night because she had run out of tramadol. -It hurts to sit stand or move.  Also hurts when she lays down.  She has not slept the past several nights because of pain.  She points to the left lower lumbar region.  Pain radiates to the left thigh anteriorly and laterally.  Associated with numbness in this area.  She has not had any falls. Rates  pain 10/10 -Started seeing a chiropractor and is seen him twice.  She has another appointment with him today.  She is using an ice pack which helps some.  Was using heat but did not find that helpful. -She lives alone.  She is depending on her neighbor and church members to assist her at this time -Of note, she had MRI of the lumbar spine done in 2014 that revealed significant spinal stenosis  She has history of B12 deficiency.  She tells me that she has vitamin B12 1000 mcg at home and she has been taking 3 of them daily.  She also has history of osteopenia.  She tells me that she takes calcium plus vitamin D3 supplement once a day.  She does not recall the dose.  She is not taking the Fosamax that I had prescribed for her in the past.  Blood pressure noted to be elevated today.  Placed on low-dose hydrochlorothiazide back in 2020 by me for blood pressure and lower extremity edema.  However patient states she has not been taking the hydrochlorothiazide because she has not had any edema in her legs.  History of hypothyroidism.  She reports compliance with taking Synthroid. Patient Active Problem List   Diagnosis Date Noted   Numbness 07/30/2019   Spinal stenosis of lumbar region 07/30/2019   Osteopenia after menopause 05/29/2019   Over weight 05/29/2019  Elevated blood pressure reading 05/29/2019   Immunization due 08/03/2017   Left leg swelling 05/04/2017   Allergic rhinitis 10/11/2015   Hypothyroidism 12/10/2013   Peripheral neuropathy 05/16/2013     Current Outpatient Medications on File Prior to Visit  Medication Sig Dispense Refill   betamethasone dipropionate 0.05 % lotion Apply 1 application topically 2 (two) times a week.     Cyanocobalamin 5000 MCG CAPS Take 5,000 mcg by mouth daily. Reported on 10/11/2015 30 capsule 11   hydrochlorothiazide (HYDRODIURIL) 12.5 MG tablet TAKE 1 TABLET BY MOUTH DAILY AS NEEDED FOR LOWER EXTREMITY SWELLING 90 tablet 0   levothyroxine (SYNTHROID)  100 MCG tablet TAKE 1 TABLET(100 MCG) BY MOUTH DAILY BEFORE AND BREAKFAST 30 tablet 6   LUTEIN PO Take 1 tablet by mouth daily.     Multiple Vitamins-Minerals (MULTIVITAMIN PO) Take 1 tablet by mouth daily.      OVER THE COUNTER MEDICATION Take 1 tablet by mouth daily as needed (cold symtpoms). Reported on 10/11/2015     sodium chloride (OCEAN) 0.65 % SOLN nasal spray Place 1 spray into both nostrils as needed for congestion. Reported on 10/11/2015     tiZANidine (ZANAFLEX) 4 MG tablet Take 0.5 tablets (2 mg total) by mouth every 6 (six) hours as needed for up to 12 doses for muscle spasms. 6 tablet 0   traMADol (ULTRAM) 50 MG tablet Take 1 tablet (50 mg total) by mouth every 8 (eight) hours as needed. 15 tablet 0   No current facility-administered medications on file prior to visit.    Allergies  Allergen Reactions   Elemental Sulfur Shortness Of Breath and Swelling   Milk-Related Compounds Nausea And Vomiting   Poison Ivy Extract [Poison Ivy Extract] Itching, Rash and Other (See Comments)    Muscle damage    Tobacco [Tobacco] Anaphylaxis and Swelling    Allergic to cigarette smoke   Cymbalta [Duloxetine Hcl] Diarrhea   Orange Juice [Orange Oil] Nausea And Vomiting    Social History   Socioeconomic History   Marital status: Widowed    Spouse name: Not on file   Number of children: 0   Years of education: college   Highest education level: Not on file  Occupational History   Not on file  Tobacco Use   Smoking status: Never   Smokeless tobacco: Never  Substance and Sexual Activity   Alcohol use: Yes    Comment: 1/4 glass per day   Drug use: No   Sexual activity: Not Currently  Other Topics Concern   Not on file  Social History Narrative   07/30/19 Patient lives at home and she has a roommate.    Has a poodle.    Patient works part time Advertising copywriter.   Patient has a college education.   Both handed. Plays organ since age 56.    Caffeine- None    Social  Determinants of Health   Financial Resource Strain: Not on file  Food Insecurity: Not on file  Transportation Needs: Not on file  Physical Activity: Not on file  Stress: Not on file  Social Connections: Not on file  Intimate Partner Violence: Not on file    Family History  Problem Relation Age of Onset   Stroke Mother        questionable   Clotting disorder Mother    Heart attack Father     Past Surgical History:  Procedure Laterality Date   CATARACT EXTRACTION Left 06/2019   MOUTH SURGERY  NASAL SINUS SURGERY      ROS: Review of Systems Negative except as stated above  PHYSICAL EXAM: BP (!) 150/85   Pulse 76   Resp 16   Wt 160 lb (72.6 kg)   SpO2 97%   BMI 28.34 kg/m   Wt Readings from Last 3 Encounters:  08/04/21 160 lb (72.6 kg)  07/27/21 154 lb (69.9 kg)  03/01/21 163 lb (73.9 kg)    Physical Exam  General appearance -frail elderly Caucasian female who appears uncomfortable sitting in the chair.  She was able to get onto the exam table independently with standby assist by myself. Mental status - normal mood, behavior, speech, dress, motor activity, and thought processes Chest - clear to auscultation, no wheezes, rales or rhonchi, symmetric air entry Heart - normal rate, regular rhythm, normal S1, S2, no murmurs, rubs, clicks or gallops Musculoskeletal -gait is slowed with low foot to floor clearance.  She favors her left side.  No tenderness on palpation of the lumbar spine.  Moderate tenderness that seems out of proportion on palpation of the left upper lumbar paraspinal muscle. Power in the right lower extremity 5/5 proximally and distally. Power left lower extremity 4/5 proximally and distally.  She gives to pain. Decree sensation to gross touch on the anterior lateral aspect of the left thigh. Extremities - peripheral pulses normal, no pedal edema, no clubbing or cyanosis   CMP Latest Ref Rng & Units 02/09/2021 03/09/2020 05/29/2019  Glucose 65 - 99  mg/dL 97 104(H) 97  BUN 8 - 27 mg/dL 9 12 12   Creatinine 0.57 - 1.00 mg/dL 0.88 0.89 0.91  Sodium 134 - 144 mmol/L 135 133(L) 138  Potassium 3.5 - 5.2 mmol/L 3.8 3.3(L) 4.4  Chloride 96 - 106 mmol/L 92(L) 100 102  CO2 20 - 29 mmol/L 25 24 22   Calcium 8.7 - 10.3 mg/dL 10.1 9.7 9.4  Total Protein 6.5 - 8.1 g/dL - 6.1(L) 6.3  Total Bilirubin 0.3 - 1.2 mg/dL - 0.7 0.5  Alkaline Phos 38 - 126 U/L - 42 66  AST 15 - 41 U/L - 25 21  ALT 0 - 44 U/L - 18 19   Lipid Panel     Component Value Date/Time   CHOL 233 (H) 05/29/2019 1127   TRIG 55 05/29/2019 1127   HDL 90 05/29/2019 1127   CHOLHDL 2.6 05/29/2019 1127   CHOLHDL 3.1 06/08/2016 1155   VLDL 20 06/08/2016 1155   LDLCALC 132 (H) 05/29/2019 1127    CBC    Component Value Date/Time   WBC 4.1 03/09/2020 1342   RBC 4.17 03/09/2020 1342   HGB 12.8 03/09/2020 1342   HGB 14.0 05/29/2019 1127   HGB 13.5 06/07/2015 1240   HCT 37.6 03/09/2020 1342   HCT 41.0 05/29/2019 1127   HCT 41.0 06/07/2015 1240   PLT 241 03/09/2020 1342   PLT 238 05/29/2019 1127   MCV 90.2 03/09/2020 1342   MCV 92 05/29/2019 1127   MCV 91.9 06/07/2015 1240   MCH 30.7 03/09/2020 1342   MCHC 34.0 03/09/2020 1342   RDW 12.9 03/09/2020 1342   RDW 12.2 05/29/2019 1127   RDW 13.6 06/07/2015 1240   LYMPHSABS 1.3 03/09/2020 1342   LYMPHSABS 1.5 06/07/2015 1240   MONOABS 0.3 03/09/2020 1342   MONOABS 0.4 06/07/2015 1240   EOSABS 0.2 03/09/2020 1342   EOSABS 0.2 06/07/2015 1240   BASOSABS 0.1 03/09/2020 1342   BASOSABS 0.1 06/07/2015 1240    ASSESSMENT AND PLAN: 1.  Left lumbar radiculopathy 2. Gait disturbance -Patient with lumbar radiculopathy symptoms.  We will get a plain x-ray of the lumbar spine along with an MRI of the lumbar spine.  Plan to refer to spine specialist -Refill given on tizanidine.  Previously she was taking 4 mg half a tablet every 4 hours.  I have changed the dose to the full 4 mg tablet to take every 8 hours.  Refill also given on  tramadol.  Advised that both medications can cause drowsiness and if this occurs she should cut back on the frequency.  Advised that tramadol is a controlled substance and we will use it for limited period of time.  [place] controlled substance reporting system reviewed. -Advised to get a cane and use it to help with her gait and help prevent falls. - MR Lumbar Spine Wo Contrast; Future - DG Lumbar Spine Complete; Future - predniSONE (DELTASONE) 10 MG tablet; Take 1 tablet (10 mg total) by mouth daily with breakfast.  Dispense: 4 tablet; Refill: 0 - tiZANidine (ZANAFLEX) 4 MG tablet; Take 1 tablet (4 mg total) by mouth every 8 (eight) hours as needed for muscle spasms.  Dispense: 30 tablet; Refill: 0 - traMADol (ULTRAM) 50 MG tablet; Take 1 tablet (50 mg total) by mouth every 8 (eight) hours as needed.  Dispense: 60 tablet; Refill: 0   3. Essential hypertension Blood pressure elevated today and on repeat check.  In looking through her chart, I see that blood pressure has been elevated on most visit with the healthcare system.  I have still added her on Norvasc.  DASH diet discussed and encouraged. - Comprehensive metabolic panel - amLODipine (NORVASC) 5 MG tablet; Take 1 tablet (5 mg total) by mouth daily. For blood pressure  Dispense: 30 tablet; Refill: 3  4. Vitamin B12 deficiency - Vitamin B12  5. Osteopenia after menopause - VITAMIN D 25 Hydroxy (Vit-D Deficiency, Fractures)  6. Hypothyroidism, unspecified type Continue levothyroxine - TSH  7. Need for influenza vaccination Given flu shot today   Patient was given the opportunity to ask questions.  Patient verbalized understanding of the plan and was able to repeat key elements of the plan.   No orders of the defined types were placed in this encounter.    Requested Prescriptions    No prescriptions requested or ordered in this encounter    No follow-ups on file.  Karle Plumber, MD, FACP

## 2021-08-04 NOTE — Patient Instructions (Signed)
Please go to the radiology department at [facility] today to have x-rays done of the lumbar spine. I have sent a refill to your pharmacy on the muscle relaxant tizanidine, the pain medication tramadol and prednisone.  Your blood pressure is elevated.  Try to limit the salt in the foods is much as possible.

## 2021-08-05 ENCOUNTER — Telehealth: Payer: Self-pay

## 2021-08-05 ENCOUNTER — Other Ambulatory Visit: Payer: Self-pay | Admitting: Internal Medicine

## 2021-08-05 DIAGNOSIS — E039 Hypothyroidism, unspecified: Secondary | ICD-10-CM

## 2021-08-05 LAB — COMPREHENSIVE METABOLIC PANEL
ALT: 23 IU/L (ref 0–32)
AST: 27 IU/L (ref 0–40)
Albumin/Globulin Ratio: 1.9 (ref 1.2–2.2)
Albumin: 4.5 g/dL (ref 3.6–4.6)
Alkaline Phosphatase: 65 IU/L (ref 44–121)
BUN/Creatinine Ratio: 13 (ref 12–28)
BUN: 10 mg/dL (ref 8–27)
Bilirubin Total: 0.7 mg/dL (ref 0.0–1.2)
CO2: 23 mmol/L (ref 20–29)
Calcium: 10.1 mg/dL (ref 8.7–10.3)
Chloride: 91 mmol/L — ABNORMAL LOW (ref 96–106)
Creatinine, Ser: 0.79 mg/dL (ref 0.57–1.00)
Globulin, Total: 2.4 g/dL (ref 1.5–4.5)
Glucose: 90 mg/dL (ref 70–99)
Potassium: 4.5 mmol/L (ref 3.5–5.2)
Sodium: 130 mmol/L — ABNORMAL LOW (ref 134–144)
Total Protein: 6.9 g/dL (ref 6.0–8.5)
eGFR: 72 mL/min/{1.73_m2} (ref >59)

## 2021-08-05 LAB — TSH: TSH: 8.39 u[IU]/mL — ABNORMAL HIGH (ref 0.450–4.500)

## 2021-08-05 LAB — VITAMIN B12: Vitamin B-12: >2000 pg/mL — ABNORMAL HIGH (ref 232–1245)

## 2021-08-05 LAB — VITAMIN D 25 HYDROXY (VIT D DEFICIENCY, FRACTURES): Vit D, 25-Hydroxy: 37.3 ng/mL (ref 30.0–100.0)

## 2021-08-05 MED ORDER — LEVOTHYROXINE SODIUM 112 MCG PO TABS: ORAL_TABLET | ORAL | 4 refills | Status: DC

## 2021-08-05 NOTE — Telephone Encounter (Signed)
Contacted pt to go over lab results pt is aware and doesn't have any questions or concerns 

## 2021-08-05 NOTE — Progress Notes (Signed)
Please call patient with these results.  Patient has a MyChart account but does not appear to review her results on MyChart.   Thyroid level abnormal.  I recommend increasing the dose of the levothyroxine from 100 mcg daily to 112 mcg daily.  Updated prescription sent to pharmacy.  After being on the increased dose for 4 weeks, please return to the lab to have her thyroid level rechecked. Vitamin B12 level elevated.  I would recommend cutting back on the vitamin B12 supplement to 1000 mcg daily instead of 3000 mcg daily. Vitamin D level is normal.  Kidney and liver function tests normal. Sodium level slightly low.  Please stop HCTZ also known as hydrochlorothiazide if she was still taking it.

## 2021-08-11 ENCOUNTER — Ambulatory Visit (HOSPITAL_COMMUNITY): Admission: RE | Admit: 2021-08-11 | Payer: PPO | Source: Ambulatory Visit

## 2021-08-12 ENCOUNTER — Telehealth: Payer: Self-pay | Admitting: Internal Medicine

## 2021-08-12 DIAGNOSIS — M5416 Radiculopathy, lumbar region: Secondary | ICD-10-CM

## 2021-08-12 MED ORDER — TIZANIDINE HCL 4 MG PO TABS: 4.0000 mg | ORAL_TABLET | Freq: Three times a day (TID) | ORAL | 1 refills | Status: DC | PRN

## 2021-08-12 NOTE — Telephone Encounter (Signed)
-----   Message from Ena Dawley sent at 08/12/2021 10:47 AM EDT ----- Regarding: RE: Neurosurgery referral Dr [name]   Patient no showed to her  mri  yesterday  . ----- Message ----- From: Ladell Pier, MD Sent: 08/05/2021   5:33 PM EDT To: Ena Dawley Subject: Neurosurgery referral                          Patient has lumbar radiculopathy.  She has an MRI of the lumbar spine scheduled for the 20th of this month.  Please try to get her in with the neurosurgeon soon after the MRI.

## 2021-08-12 NOTE — Telephone Encounter (Signed)
Tried contacting the scheduling department to schedule MRI and was not able to get anyone will call on Monday

## 2021-08-12 NOTE — Telephone Encounter (Signed)
Tried contacting the scheduling department to schedule CT and was not able to get anyone will call on Monday

## 2021-08-12 NOTE — Telephone Encounter (Signed)
I received message from our referral coordinator informing me that patient no showed for the lumbar MRI yesterday.  I called patient today to find out why she did not show up.  Patient states that she was afraid that she would not be able to lay down with her legs straight for a significant period of time due to spasms.  I told patient that it is very important that she gets this MRI done and that she should take tramadol and one of her muscle relaxants about 30 minutes prior to the MRI.  She expressed understanding and is agreeable to having this rescheduled.  She states that she took her last muscle relaxant today.  I will send a refill on it to her pharmacy.

## 2021-08-15 NOTE — Telephone Encounter (Signed)
MRI has been rescheduled

## 2021-08-20 ENCOUNTER — Ambulatory Visit (HOSPITAL_COMMUNITY)
Admission: RE | Admit: 2021-08-20 | Discharge: 2021-08-20 | Disposition: A | Discharge status: Home / Self Care | Payer: PPO | Source: Ambulatory Visit | Attending: Internal Medicine | Admitting: Internal Medicine

## 2021-08-20 ENCOUNTER — Other Ambulatory Visit: Payer: Self-pay

## 2021-08-20 DIAGNOSIS — M5416 Radiculopathy, lumbar region: Secondary | ICD-10-CM

## 2021-08-20 DIAGNOSIS — R269 Unspecified abnormalities of gait and mobility: Secondary | ICD-10-CM | POA: Diagnosis not present

## 2021-08-24 ENCOUNTER — Telehealth: Payer: Self-pay | Admitting: Internal Medicine

## 2021-08-24 DIAGNOSIS — M5416 Radiculopathy, lumbar region: Secondary | ICD-10-CM

## 2021-08-24 MED ORDER — TRAMADOL HCL 50 MG PO TABS: 50.0000 mg | ORAL_TABLET | Freq: Three times a day (TID) | ORAL | 0 refills | Status: DC | PRN

## 2021-08-24 NOTE — Telephone Encounter (Signed)
Phone call placed to patient this morning to go over the results of the MRI of the lumbar spine.  2 patient identifiers used. Patient informed that the MRI of the lumbar spine shows slipped disc that is new at the level L2-L3 and moderate to severe spinal stenosis at the other lumbar levels that is not new but has slightly progressed.  Patient has a follow-up appointment tomorrow morning with Dr. Susa Day.  She tells me that she took her last tramadol this morning.  Pain is worse at nights.  She finds using an ice pack to be most helpful.  She denies any adverse side effects from the tramadol like increased drowsiness or constipation.  I told her that I will send a refill on the tramadol to her pharmacy today.  Patient thanked me for the call.  [place] reviewed.

## 2021-08-31 ENCOUNTER — Encounter: Payer: Self-pay | Admitting: Internal Medicine

## 2021-08-31 NOTE — Progress Notes (Signed)
Patient seen by Dr. Julian Reil on 08/25/2021.  He reviewed her MRI report.  His assessment was that patient is minimally symptomatic at L3 radicular pain due to disc herniation at L2-3.  Underlying spinal stenosis and spondylolisthesis at L4-5.  He recommends physical therapy.  He recommend reducing Zanaflex to 2 mg.  A prescription was given.  Plan is to have her follow-up in 3 weeks.

## 2021-12-12 ENCOUNTER — Other Ambulatory Visit: Payer: Self-pay | Admitting: Internal Medicine

## 2021-12-12 DIAGNOSIS — M5416 Radiculopathy, lumbar region: Secondary | ICD-10-CM

## 2021-12-13 NOTE — Telephone Encounter (Signed)
Requested medication (s) are due for refill today: yes  Requested medication (s) are on the active medication list: yes    Last refill: 08/12/21 #30  1 refill  Future visit scheduled no  Notes to clinic:Not delegated  Requested Prescriptions  Pending Prescriptions Disp Refills   tiZANidine (ZANAFLEX) 4 MG tablet [Pharmacy Med Name: TIZANIDINE 4MG  TABLETS] 30 tablet 1    Sig: TAKE 1 TABLET(4 MG) BY MOUTH EVERY 8 HOURS AS NEEDED FOR MUSCLE SPASMS     Not Delegated - Cardiovascular:  Alpha-2 Agonists - tizanidine Failed - 12/12/2021 12:41 PM      Failed - This refill cannot be delegated      Passed - Valid encounter within last 6 months    Recent Outpatient Visits           4 months ago Left lumbar radiculopathy   [place], MD   10 months ago Hypothyroidism, unspecified type   [place], [place], MD   11 months ago Hospital discharge follow-up   [place], Zelda W, NP   2 years ago Acute pain of right shoulder   [place], MD   2 years ago Elevated blood pressure reading   [place], RPH-CPP

## 2021-12-28 ENCOUNTER — Ambulatory Visit: Payer: PPO | Attending: Physician Assistant | Admitting: Physician Assistant

## 2021-12-28 ENCOUNTER — Other Ambulatory Visit: Payer: Self-pay

## 2021-12-28 ENCOUNTER — Encounter: Payer: Self-pay | Admitting: Physician Assistant

## 2021-12-28 ENCOUNTER — Encounter (INDEPENDENT_AMBULATORY_CARE_PROVIDER_SITE_OTHER): Payer: Self-pay

## 2021-12-28 VITALS — BP 156/81 | HR 67 | Wt 162.6 lb

## 2021-12-28 DIAGNOSIS — E039 Hypothyroidism, unspecified: Secondary | ICD-10-CM | POA: Diagnosis not present

## 2021-12-28 DIAGNOSIS — E871 Hypo-osmolality and hyponatremia: Secondary | ICD-10-CM | POA: Diagnosis not present

## 2021-12-28 DIAGNOSIS — M7989 Other specified soft tissue disorders: Secondary | ICD-10-CM | POA: Diagnosis not present

## 2021-12-28 MED ORDER — FUROSEMIDE 20 MG PO TABS
ORAL_TABLET | ORAL | 3 refills | Status: DC
Start: 2021-12-28 — End: 2022-04-24

## 2021-12-28 NOTE — Patient Instructions (Signed)
Edema ?Edema is when you have too much fluid in your body or under your skin. Edema may make your legs, feet, and ankles swell. Swelling often happens in looser tissues, such as around your eyes. This is a common condition. It gets more common as you get older. ?There are many possible causes of edema. These include: ?Eating too much salt (sodium). ?Being on your feet or sitting for a long time. ?Certain medical conditions, such as: ?Pregnancy. ?Heart failure. ?Liver disease. ?Kidney disease. ?Cancer. ?Hot weather may make edema worse. Edema is usually painless. Your skin may look swollen or shiny. ?Follow these instructions at home: ?Medicines ?Take over-the-counter and prescription medicines only as told by your doctor. ?Your doctor may prescribe a medicine to help your body get rid of extra water (diuretic). Take this medicine if you are told to take it. ?Eating and drinking ?Eat a low-salt (low-sodium) diet as told by your doctor. Sometimes, eating less salt may reduce swelling. ?Depending on the cause of your swelling, you may need to limit how much fluid you drink (fluid restriction). ?General instructions ?Raise the injured area above the level of your heart while you are sitting or lying down. ?Do not sit still or stand for a long time. ?Do not wear tight clothes. Do not wear garters on your upper legs. ?Exercise your legs. This can help the swelling go down. ?Wear compression stockings as told by your doctor. It is important that these are the right size. These should be prescribed by your doctor to prevent possible injuries. ?If elastic bandages or wraps are recommended, use them as told by your doctor. ?Contact a doctor if: ?Treatment is not working. ?You have heart, liver, or kidney disease and have symptoms of edema. ?You have sudden and unexplained weight gain. ?Get help right away if: ?You have shortness of breath or chest pain. ?You cannot breathe when you lie down. ?You have pain, redness, or warmth  in the swollen areas. ?You have heart, liver, or kidney disease and get edema all of a sudden. ?You have a fever and your symptoms get worse all of a sudden. ?These symptoms may be an emergency. Get help right away. Call 911. ?Do not wait to see if the symptoms will go away. ?Do not drive yourself to the hospital. ?Summary ?Edema is when you have too much fluid in your body or under your skin. ?Edema may make your legs, feet, and ankles swell. Swelling often happens in looser tissues, such as around your eyes. ?Raise the injured area above the level of your heart while you are sitting or lying down. ?Follow your doctor's instructions about diet and how much fluid you can drink. ?This information is not intended to replace advice given to you by your health care provider. Make sure you discuss any questions you have with your health care provider. ?Document Revised: 06/13/2021 Document Reviewed: 06/13/2021 ?Elsevier Patient Education ? 2022 [employer] ? ?

## 2021-12-28 NOTE — Progress Notes (Signed)
Patient ID: Krystal Moore, female   DOB: 1929-11-04, 88 y.o.   MRN: [id number] ? ? ?Krystal Moore, is a 88 y.o. female ? ?QIH:[id number] ? ?OVF:[id number] ? ?DOB - 1930-02-13 ? ?Chief Complaint  ?Patient presents with  ? Leg Swelling  ?    ? ?Subjective:  ? ?Krystal Moore is a 88 y.o. female here today for lower leg swelling L>R.  Her L has always swollen more than the R.  No SOB.  She is very active.  Previously on HCTZ for this but discontinued due to low sodium.  Last TSH level=about 8.   ? ?No problems updated. ? ?ALLERGIES: ?Allergies  ?Allergen Reactions  ? Elemental Sulfur Shortness Of Breath and Swelling  ? Milk-Related Compounds Nausea And Vomiting  ? Poison Ivy Extract [Poison Ivy Extract] Itching, Rash and Other (See Comments)  ?  Muscle damage   ? Tobacco [Tobacco] Anaphylaxis and Swelling  ?  Allergic to cigarette smoke  ? Cymbalta [Duloxetine Hcl] Diarrhea  ? Orange Juice [Orange Oil] Nausea And Vomiting  ? ? ?PAST MEDICAL HISTORY: ?Past Medical History:  ?Diagnosis Date  ? Anemia   ? Narcolepsy   ? Neuropathy   ? Obesity   ? Pneumonia   ? Polyneuropathy in other diseases classified elsewhere [facility])   ? Shoulder fracture, left   ? ? ?MEDICATIONS AT HOME: ?Prior to Admission medications   ?Medication Sig Start Date End Date Taking? Authorizing Provider  ?amLODipine (NORVASC) 5 MG tablet Take 1 tablet (5 mg total) by mouth daily. For blood pressure 08/04/21  Yes Ladell Pier, MD  ?furosemide (LASIX) 20 MG tablet Take 1 daily for 5 days then 1 daily as needed for swelling(use sparingly) 12/28/21  Yes Jullia Mulligan, Dionne Bucy, PA-C  ?levothyroxine (SYNTHROID) 112 MCG tablet TAKE 1 TABLET(100 MCG) BY MOUTH DAILY BEFORE AND BREAKFAST 08/05/21  Yes Ladell Pier, MD  ?betamethasone dipropionate 0.05 % lotion Apply 1 application topically 2 (two) times a week. 01/23/20   [provider]  ?Cyanocobalamin 5000 MCG CAPS Take 5,000 mcg by mouth daily. Reported on 10/11/2015  10/11/15   Boykin Nearing, MD  ?hydrochlorothiazide (HYDRODIURIL) 12.5 MG tablet TAKE 1 TABLET BY MOUTH DAILY AS NEEDED FOR LOWER EXTREMITY SWELLING 03/25/21   Ladell Pier, MD  ?LUTEIN PO Take 1 tablet by mouth daily.    [provider]  ?Multiple Vitamins-Minerals (MULTIVITAMIN PO) Take 1 tablet by mouth daily.     [provider]  ?OVER THE COUNTER MEDICATION Take 1 tablet by mouth daily as needed (cold symtpoms). Reported on 10/11/2015    [provider]  ?predniSONE (DELTASONE) 10 MG tablet Take 1 tablet (10 mg total) by mouth daily with breakfast. 08/04/21   Ladell Pier, MD  ?sodium chloride (OCEAN) 0.65 % SOLN nasal spray Place 1 spray into both nostrils as needed for congestion. Reported on 10/11/2015    [provider]  ?tiZANidine (ZANAFLEX) 4 MG tablet TAKE 1 TABLET(4 MG) BY MOUTH EVERY 8 HOURS AS NEEDED FOR MUSCLE SPASMS 12/13/21   Ladell Pier, MD  ?traMADol (ULTRAM) 50 MG tablet Take 1 tablet (50 mg total) by mouth every 8 (eight) hours as needed. 08/24/21   Ladell Pier, MD  ? ? ?ROS: ?Neg HEENT ?Neg resp ?Neg cardiac ?Neg GI ?Neg GU ?Neg MS ?Neg psych ?Neg neuro ? ?Objective:  ? ?Vitals:  ? 12/28/21 1051  ?BP: (!) 156/81  ?Pulse: 67  ?SpO2: 98%  ?Weight: 162 lb 9.6 oz (  73.8 kg)  ? ?Exam ?General appearance : Awake, alert, not in any distress. Speech Clear. Not toxic looking ?HEENT: Atraumatic and Normocephalic ?Neck: Supple, no JVD. No cervical lymphadenopathy.  ?Chest: Good air entry bilaterally, CTAB.  No rales/rhonchi/wheezing ?CVS: S1 S2 regular, no murmurs.  ?Extremities: B/L Lower Ext shows B edema L>R with mild pitting.  Skin is warm dry and intact without any ulceration.  Neg Homan's B and no erythema, both legs are warm to touch ?Neurology: Awake alert, and oriented X 3, CN II-XII intact, Non focal ?Skin: No Rash ? ?Data Review ?Lab Results  ?Component Value Date  ? HGBA1C 5.3 05/29/2019  ? HGBA1C 5.4 06/08/2016  ? HGBA1C 5.5  05/16/2013  ? ? ?Assessment & Plan  ? ?1. Hypothyroidism, unspecified type ?Will assess level today then make dosage recommendation ?- Thyroid Panel With TSH ?- Basic metabolic panel ? ?2. Left leg swelling ?- furosemide (LASIX) 20 MG tablet; Take 1 daily for 5 days then 1 daily as needed for swelling(use sparingly)  Dispense: 30 tablet; Refill: 3 ? ?3. Hyponatremia ?- Basic metabolic panel ? ? ? ?Patient have been counseled extensively about nutrition and exercise. Other issues discussed during this visit include: low cholesterol diet, weight control and daily exercise, foot care, annual eye examinations at Ophthalmology, importance of adherence with medications and regular follow-up. We also discussed long term complications of uncontrolled diabetes and hypertension.  ? ?Return in about 4 months (around 04/29/2022) for see PCP for chronic conditions. ? ?The patient was given clear instructions to go to ER or return to medical center if symptoms don't improve, worsen or new problems develop. The patient verbalized understanding. The patient was told to call to get lab results if they haven't heard anything in the next week.  ? ? ? ? ?Freeman Caldron, PA-C ?[place] ?[place], [place] ?[phone]   ?12/28/2021, 11:13 AM  ?

## 2021-12-29 LAB — THYROID PANEL WITH TSH
Free Thyroxine Index: 2.8 (ref 1.2–4.9)
T3 Uptake Ratio: 26 % (ref 24–39)
T4, Total: 10.6 ug/dL (ref 4.5–12.0)
TSH: 0.508 u[IU]/mL (ref 0.450–4.500)

## 2021-12-29 LAB — BASIC METABOLIC PANEL
BUN/Creatinine Ratio: 11 — ABNORMAL LOW (ref 12–28)
BUN: 9 mg/dL (ref 8–27)
CO2: 23 mmol/L (ref 20–29)
Calcium: 10 mg/dL (ref 8.7–10.3)
Chloride: 98 mmol/L (ref 96–106)
Creatinine, Ser: 0.8 mg/dL (ref 0.57–1.00)
Glucose: 99 mg/dL (ref 70–99)
Potassium: 4.5 mmol/L (ref 3.5–5.2)
Sodium: 134 mmol/L (ref 134–144)
eGFR: 70 mL/min/{1.73_m2} (ref >59)

## 2022-01-01 ENCOUNTER — Other Ambulatory Visit: Payer: Self-pay | Admitting: Internal Medicine

## 2022-01-01 DIAGNOSIS — E039 Hypothyroidism, unspecified: Secondary | ICD-10-CM

## 2022-01-02 NOTE — Telephone Encounter (Signed)
Requested Prescriptions  ?Pending Prescriptions Disp Refills  ?? levothyroxine (SYNTHROID) 112 MCG tablet [Pharmacy Med Name: LEVOTHYROXINE 0.'112MG'$  (112MCG) TABS] 90 tablet 3  ?  Sig: TAKE 1 TABLET BY MOUTH EVERY DAY BEFORE BREAKFAST  ?  ? Endocrinology:  Hypothyroid Agents Passed - 01/01/2022  1:17 PM  ?  ?  Passed - TSH in normal range and within 360 days  ?  TSH  ?Date Value Ref Range Status  ?12/28/2021 0.508 0.450 - 4.500 uIU/mL Final  ?   ?  ?  Passed - Valid encounter within last 12 months  ?  Recent Outpatient Visits   ?      ? 5 days ago Hypothyroidism, unspecified type  ? [facility] [place], [place], [place]  ? 5 months ago Left lumbar radiculopathy  ? [place] Karle Plumber B, MD  ? 10 months ago Hypothyroidism, unspecified type  ? [place] Rakes, MD  ? 1 year ago Hospital discharge follow-up  ? [place], [place] W, NP  ? 2 years ago Acute pain of right shoulder  ? [place] Ladell Pier, MD  ?  ?  ?Future Appointments   ?        ? In 4 months Ladell Pier, MD [place]  ?  ? ?  ?  ?  ? ?

## 2022-01-24 ENCOUNTER — Other Ambulatory Visit: Payer: Self-pay | Admitting: Internal Medicine

## 2022-01-24 DIAGNOSIS — M5416 Radiculopathy, lumbar region: Secondary | ICD-10-CM

## 2022-03-27 ENCOUNTER — Other Ambulatory Visit: Payer: Self-pay | Admitting: Internal Medicine

## 2022-03-27 DIAGNOSIS — I10 Essential (primary) hypertension: Secondary | ICD-10-CM

## 2022-03-31 DIAGNOSIS — G629 Polyneuropathy, unspecified: Secondary | ICD-10-CM | POA: Diagnosis not present

## 2022-03-31 DIAGNOSIS — R609 Edema, unspecified: Secondary | ICD-10-CM | POA: Diagnosis not present

## 2022-03-31 DIAGNOSIS — Z8249 Family history of ischemic heart disease and other diseases of the circulatory system: Secondary | ICD-10-CM | POA: Diagnosis not present

## 2022-03-31 DIAGNOSIS — M62838 Other muscle spasm: Secondary | ICD-10-CM | POA: Diagnosis not present

## 2022-03-31 DIAGNOSIS — R32 Unspecified urinary incontinence: Secondary | ICD-10-CM | POA: Diagnosis not present

## 2022-03-31 DIAGNOSIS — E039 Hypothyroidism, unspecified: Secondary | ICD-10-CM | POA: Diagnosis not present

## 2022-03-31 DIAGNOSIS — Z87892 Personal history of anaphylaxis: Secondary | ICD-10-CM | POA: Diagnosis not present

## 2022-03-31 DIAGNOSIS — Z882 Allergy status to sulfonamides status: Secondary | ICD-10-CM | POA: Diagnosis not present

## 2022-03-31 DIAGNOSIS — I10 Essential (primary) hypertension: Secondary | ICD-10-CM | POA: Diagnosis not present

## 2022-03-31 DIAGNOSIS — Z008 Encounter for other general examination: Secondary | ICD-10-CM | POA: Diagnosis not present

## 2022-04-24 ENCOUNTER — Other Ambulatory Visit: Payer: Self-pay | Admitting: Physician Assistant

## 2022-04-24 DIAGNOSIS — M7989 Other specified soft tissue disorders: Secondary | ICD-10-CM

## 2022-05-04 ENCOUNTER — Ambulatory Visit: Payer: PPO | Admitting: Internal Medicine

## 2022-05-04 DIAGNOSIS — H04123 Dry eye syndrome of bilateral lacrimal glands: Secondary | ICD-10-CM | POA: Diagnosis not present

## 2022-05-04 DIAGNOSIS — H353132 Nonexudative age-related macular degeneration, bilateral, intermediate dry stage: Secondary | ICD-10-CM | POA: Diagnosis not present

## 2022-05-04 DIAGNOSIS — H1045 Other chronic allergic conjunctivitis: Secondary | ICD-10-CM | POA: Diagnosis not present

## 2022-05-04 DIAGNOSIS — Z961 Presence of intraocular lens: Secondary | ICD-10-CM | POA: Diagnosis not present

## 2022-06-07 ENCOUNTER — Inpatient Hospital Stay (HOSPITAL_COMMUNITY)
Admission: EM | Admit: 2022-06-07 | Discharge: 2022-06-14 | DRG: 481 | Disposition: A | Discharge status: Skilled Nursing Facility | Payer: Medicare HMO | Attending: Internal Medicine | Admitting: Internal Medicine

## 2022-06-07 ENCOUNTER — Emergency Department (HOSPITAL_COMMUNITY): Payer: Medicare HMO

## 2022-06-07 ENCOUNTER — Inpatient Hospital Stay (HOSPITAL_COMMUNITY): Payer: Medicare HMO | Admitting: Anesthesiology

## 2022-06-07 ENCOUNTER — Other Ambulatory Visit: Payer: Self-pay

## 2022-06-07 ENCOUNTER — Inpatient Hospital Stay (HOSPITAL_COMMUNITY): Payer: Medicare HMO

## 2022-06-07 ENCOUNTER — Encounter (HOSPITAL_COMMUNITY): Payer: Self-pay | Admitting: Emergency Medicine

## 2022-06-07 DIAGNOSIS — S72001A Fracture of unspecified part of neck of right femur, initial encounter for closed fracture: Secondary | ICD-10-CM | POA: Diagnosis not present

## 2022-06-07 DIAGNOSIS — M25551 Pain in right hip: Secondary | ICD-10-CM | POA: Diagnosis not present

## 2022-06-07 DIAGNOSIS — Y92009 Unspecified place in unspecified non-institutional (private) residence as the place of occurrence of the external cause: Secondary | ICD-10-CM

## 2022-06-07 DIAGNOSIS — W1830XA Fall on same level, unspecified, initial encounter: Secondary | ICD-10-CM | POA: Diagnosis present

## 2022-06-07 DIAGNOSIS — G8929 Other chronic pain: Secondary | ICD-10-CM | POA: Diagnosis not present

## 2022-06-07 DIAGNOSIS — D259 Leiomyoma of uterus, unspecified: Secondary | ICD-10-CM | POA: Diagnosis not present

## 2022-06-07 DIAGNOSIS — Z79890 Hormone replacement therapy (postmenopausal): Secondary | ICD-10-CM

## 2022-06-07 DIAGNOSIS — M25572 Pain in left ankle and joints of left foot: Secondary | ICD-10-CM | POA: Diagnosis not present

## 2022-06-07 DIAGNOSIS — G8911 Acute pain due to trauma: Secondary | ICD-10-CM | POA: Diagnosis not present

## 2022-06-07 DIAGNOSIS — W19XXXA Unspecified fall, initial encounter: Secondary | ICD-10-CM

## 2022-06-07 DIAGNOSIS — M62838 Other muscle spasm: Secondary | ICD-10-CM | POA: Diagnosis not present

## 2022-06-07 DIAGNOSIS — E039 Hypothyroidism, unspecified: Secondary | ICD-10-CM | POA: Diagnosis present

## 2022-06-07 DIAGNOSIS — Z888 Allergy status to other drugs, medicaments and biological substances status: Secondary | ICD-10-CM

## 2022-06-07 DIAGNOSIS — E663 Overweight: Secondary | ICD-10-CM | POA: Diagnosis not present

## 2022-06-07 DIAGNOSIS — W010XXA Fall on same level from slipping, tripping and stumbling without subsequent striking against object, initial encounter: Secondary | ICD-10-CM | POA: Diagnosis not present

## 2022-06-07 DIAGNOSIS — Z91011 Allergy to milk products: Secondary | ICD-10-CM

## 2022-06-07 DIAGNOSIS — D62 Acute posthemorrhagic anemia: Secondary | ICD-10-CM | POA: Diagnosis not present

## 2022-06-07 DIAGNOSIS — Z79899 Other long term (current) drug therapy: Secondary | ICD-10-CM

## 2022-06-07 DIAGNOSIS — M1990 Unspecified osteoarthritis, unspecified site: Secondary | ICD-10-CM | POA: Diagnosis not present

## 2022-06-07 DIAGNOSIS — I959 Hypotension, unspecified: Secondary | ICD-10-CM | POA: Diagnosis not present

## 2022-06-07 DIAGNOSIS — Z91018 Allergy to other foods: Secondary | ICD-10-CM

## 2022-06-07 DIAGNOSIS — I10 Essential (primary) hypertension: Secondary | ICD-10-CM | POA: Diagnosis not present

## 2022-06-07 DIAGNOSIS — S72141A Displaced intertrochanteric fracture of right femur, initial encounter for closed fracture: Principal | ICD-10-CM | POA: Diagnosis present

## 2022-06-07 DIAGNOSIS — Z91048 Other nonmedicinal substance allergy status: Secondary | ICD-10-CM | POA: Diagnosis not present

## 2022-06-07 DIAGNOSIS — Z01818 Encounter for other preprocedural examination: Secondary | ICD-10-CM | POA: Diagnosis not present

## 2022-06-07 DIAGNOSIS — Z6829 Body mass index (BMI) 29.0-29.9, adult: Secondary | ICD-10-CM | POA: Diagnosis not present

## 2022-06-07 DIAGNOSIS — G629 Polyneuropathy, unspecified: Secondary | ICD-10-CM | POA: Diagnosis not present

## 2022-06-07 DIAGNOSIS — Z043 Encounter for examination and observation following other accident: Secondary | ICD-10-CM | POA: Diagnosis not present

## 2022-06-07 DIAGNOSIS — K449 Diaphragmatic hernia without obstruction or gangrene: Secondary | ICD-10-CM | POA: Diagnosis not present

## 2022-06-07 LAB — CBC WITH DIFFERENTIAL/PLATELET
Abs Immature Granulocytes: 0.02 10*3/uL (ref 0.00–0.07)
Basophils Absolute: 0 10*3/uL (ref 0.0–0.1)
Basophils Relative: 1 %
Eosinophils Absolute: 0.2 10*3/uL (ref 0.0–0.5)
Eosinophils Relative: 4 %
HCT: 36.5 % (ref 36.0–46.0)
Hemoglobin: 12.6 g/dL (ref 12.0–15.0)
Immature Granulocytes: 0 %
Lymphocytes Relative: 35 %
Lymphs Abs: 1.6 10*3/uL (ref 0.7–4.0)
MCH: 31.6 pg (ref 26.0–34.0)
MCHC: 34.5 g/dL (ref 30.0–36.0)
MCV: 91.5 fL (ref 80.0–100.0)
Monocytes Absolute: 0.3 10*3/uL (ref 0.1–1.0)
Monocytes Relative: 5 %
Neutro Abs: 2.5 10*3/uL (ref 1.7–7.7)
Neutrophils Relative %: 55 %
Platelets: 209 10*3/uL (ref 150–400)
RBC: 3.99 MIL/uL (ref 3.87–5.11)
RDW: 13 % (ref 11.5–15.5)
WBC: 4.7 10*3/uL (ref 4.0–10.5)
nRBC: 0 % (ref 0.0–0.2)

## 2022-06-07 LAB — BASIC METABOLIC PANEL
Anion gap: 8 (ref 5–15)
BUN: 13 mg/dL (ref 8–23)
CO2: 25 mmol/L (ref 22–32)
Calcium: 9.3 mg/dL (ref 8.9–10.3)
Chloride: 103 mmol/L (ref 98–111)
Creatinine, Ser: 0.87 mg/dL (ref 0.44–1.00)
GFR, Estimated: >60 mL/min (ref >60)
Glucose, Bld: 124 mg/dL — ABNORMAL HIGH (ref 70–99)
Potassium: 4 mmol/L (ref 3.5–5.1)
Sodium: 136 mmol/L (ref 135–145)

## 2022-06-07 LAB — TYPE AND SCREEN
ABO/RH(D): O NEG
Antibody Screen: NEGATIVE

## 2022-06-07 LAB — PROTIME-INR
INR: 1 (ref 0.8–1.2)
Prothrombin Time: 13.3 seconds (ref 11.4–15.2)

## 2022-06-07 MED ORDER — ROPIVACAINE HCL 5 MG/ML IJ SOLN
INTRAMUSCULAR | Status: DC | PRN
  Administered 2022-06-07: 30 mL via PERINEURAL

## 2022-06-07 MED ORDER — CEFAZOLIN SODIUM-DEXTROSE 2-4 GM/100ML-% IV SOLN
2.0000 g | INTRAVENOUS | Status: AC
  Administered 2022-06-08: 2 g via INTRAVENOUS
  Filled 2022-06-07: qty 100

## 2022-06-07 MED ORDER — POVIDONE-IODINE 10 % EX SWAB: 2.0000 | Freq: Once | CUTANEOUS | Status: DC

## 2022-06-07 MED ORDER — MORPHINE SULFATE (PF) 2 MG/ML IV SOLN
0.5000 mg | INTRAVENOUS | Status: DC | PRN
  Administered 2022-06-08: 0.5 mg via INTRAVENOUS
  Filled 2022-06-07: qty 1

## 2022-06-07 MED ORDER — SODIUM CHLORIDE 0.9 % IV SOLN: INTRAVENOUS | Status: DC

## 2022-06-07 MED ORDER — TRANEXAMIC ACID-NACL 1000-0.7 MG/100ML-% IV SOLN
1000.0000 mg | INTRAVENOUS | Status: AC
  Administered 2022-06-08: 1000 mg via INTRAVENOUS
  Filled 2022-06-07: qty 100

## 2022-06-07 MED ORDER — FENTANYL CITRATE PF 50 MCG/ML IJ SOSY
50.0000 ug | PREFILLED_SYRINGE | INTRAMUSCULAR | Status: AC | PRN
  Administered 2022-06-07 (×2): 50 ug via INTRAVENOUS
  Filled 2022-06-07 (×2): qty 1

## 2022-06-07 MED ORDER — FENTANYL CITRATE PF 50 MCG/ML IJ SOSY
PREFILLED_SYRINGE | INTRAMUSCULAR | Status: AC
  Filled 2022-06-07: qty 2

## 2022-06-07 MED ORDER — ONDANSETRON HCL 4 MG/2ML IJ SOLN
4.0000 mg | Freq: Once | INTRAMUSCULAR | Status: AC
  Administered 2022-06-07: 4 mg via INTRAVENOUS
  Filled 2022-06-07: qty 2

## 2022-06-07 MED ORDER — CHLORHEXIDINE GLUCONATE 4 % EX LIQD: 60.0000 mL | Freq: Once | CUTANEOUS | Status: DC

## 2022-06-07 MED ORDER — MELATONIN 3 MG PO TABS
3.0000 mg | ORAL_TABLET | Freq: Every day | ORAL | Status: AC
  Administered 2022-06-07 – 2022-06-08 (×2): 3 mg via ORAL
  Filled 2022-06-07 (×2): qty 1

## 2022-06-07 MED ORDER — MIDAZOLAM HCL 2 MG/2ML IJ SOLN
INTRAMUSCULAR | Status: AC
  Filled 2022-06-07: qty 2

## 2022-06-07 MED ORDER — HYDROCODONE-ACETAMINOPHEN 5-325 MG PO TABS
1.0000 | ORAL_TABLET | Freq: Four times a day (QID) | ORAL | Status: DC | PRN
  Administered 2022-06-07: 1 via ORAL
  Administered 2022-06-08 – 2022-06-10 (×7): 2 via ORAL
  Administered 2022-06-10: 1 via ORAL
  Administered 2022-06-10: 2 via ORAL
  Administered 2022-06-11 – 2022-06-14 (×6): 1 via ORAL
  Filled 2022-06-07: qty 1
  Filled 2022-06-07 (×2): qty 2
  Filled 2022-06-07 (×2): qty 1
  Filled 2022-06-07: qty 2
  Filled 2022-06-07 (×2): qty 1
  Filled 2022-06-07: qty 2
  Filled 2022-06-07: qty 1
  Filled 2022-06-07 (×4): qty 2
  Filled 2022-06-07: qty 1
  Filled 2022-06-07: qty 2
  Filled 2022-06-07: qty 1

## 2022-06-07 MED ORDER — PROCHLORPERAZINE EDISYLATE 10 MG/2ML IJ SOLN
10.0000 mg | Freq: Four times a day (QID) | INTRAMUSCULAR | Status: DC | PRN
Start: 2022-06-07 — End: 2022-06-14

## 2022-06-07 NOTE — ED Provider Notes (Signed)
[facility] DEPT Provider Note   CSN: [id number] Arrival date & time: 06/07/22  1125     History  Chief Complaint  Patient presents with   Fall   Hip Pain    Krystal Moore is a 88 y.o. female.  This is a 87 year old female presents after mechanical fall just prior to arrival.  Patient fell and struck her right hip.  No loss of consciousness.  Was unable to ambulate.  Denies any other pains at this time.  Pain to her right hip is described as sharp and worse with any movement.  Denies any distal numbness or tingling to her right foot.  No prior history of hip surgery       Home Medications Prior to Admission medications   Medication Sig Start Date End Date Taking? Authorizing Provider  amLODipine (NORVASC) 5 MG tablet TAKE 1 TABLET(5 MG) BY MOUTH DAILY FOR BLOOD PRESSURE 03/27/22   Ladell Pier, MD  betamethasone dipropionate 0.05 % lotion Apply 1 application topically 2 (two) times a week. 01/23/20   [provider]  Cyanocobalamin 5000 MCG CAPS Take 5,000 mcg by mouth daily. Reported on 10/11/2015 10/11/15   Boykin Nearing, MD  furosemide (LASIX) 20 MG tablet TAKE 1 TABLET BY MOUTH EVERY DAY FOR 5 DAYS THEN 1 EVERY DAY AS NEEDED FOR SWELLING(USE SPARINGLY) 04/24/22   Ladell Pier, MD  hydrochlorothiazide (HYDRODIURIL) 12.5 MG tablet TAKE 1 TABLET BY MOUTH DAILY AS NEEDED FOR LOWER EXTREMITY SWELLING 03/25/21   Ladell Pier, MD  levothyroxine (SYNTHROID) 112 MCG tablet TAKE 1 TABLET BY MOUTH EVERY DAY BEFORE BREAKFAST 01/02/22   Ladell Pier, MD  LUTEIN PO Take 1 tablet by mouth daily.    [provider]  Multiple Vitamins-Minerals (MULTIVITAMIN PO) Take 1 tablet by mouth daily.     [provider]  OVER THE COUNTER MEDICATION Take 1 tablet by mouth daily as needed (cold symtpoms). Reported on 10/11/2015    [provider]  predniSONE (DELTASONE) 10 MG tablet Take 1 tablet (10 mg total)  by mouth daily with breakfast. 08/04/21   Ladell Pier, MD  sodium chloride (OCEAN) 0.65 % SOLN nasal spray Place 1 spray into both nostrils as needed for congestion. Reported on 10/11/2015    [provider]  tiZANidine (ZANAFLEX) 4 MG tablet TAKE 1 TABLET(4 MG) BY MOUTH EVERY 8 HOURS AS NEEDED FOR MUSCLE SPASMS 01/25/22   Ladell Pier, MD  traMADol (ULTRAM) 50 MG tablet Take 1 tablet (50 mg total) by mouth every 8 (eight) hours as needed. 08/24/21   Ladell Pier, MD      Allergies    Elemental sulfur, Milk-related compounds, Poison ivy extract [poison ivy extract], Tobacco [tobacco], Cymbalta [duloxetine hcl], and Orange juice [orange oil]    Review of Systems   Review of Systems  All other systems reviewed and are negative.   Physical Exam Updated Vital Signs BP (!) 170/67 (BP Location: Left Arm)   Pulse 73   Temp 97.9 F (36.6 C) (Oral)   Resp 15   SpO2 94%  Physical Exam Vitals and nursing note reviewed.  Constitutional:      General: She is not in acute distress.    Appearance: Normal appearance. She is well-developed. She is not toxic-appearing.  HENT:     Head: Normocephalic and atraumatic.  Eyes:     General: Lids are normal.     Conjunctiva/sclera: Conjunctivae normal.     Pupils:  Pupils are equal, round, and reactive to light.  Neck:     Thyroid: No thyroid mass.     Trachea: No tracheal deviation.  Cardiovascular:     Rate and Rhythm: Normal rate and regular rhythm.     Heart sounds: Normal heart sounds. No murmur heard.    No gallop.  Pulmonary:     Effort: Pulmonary effort is normal. No respiratory distress.     Breath sounds: Normal breath sounds. No stridor. No decreased breath sounds, wheezing, rhonchi or rales.  Abdominal:     General: There is no distension.     Palpations: Abdomen is soft.     Tenderness: There is no abdominal tenderness. There is no rebound.  Musculoskeletal:        General: No tenderness. Normal range of  motion.     Cervical back: Normal range of motion and neck supple.       Legs:     Comments: Right lower extremity shortened and rotated.  Neurovascular status intact at right foot  Skin:    General: Skin is warm and dry.     Findings: No abrasion or rash.  Neurological:     Mental Status: She is alert and oriented to person, place, and time. Mental status is at baseline.     GCS: GCS eye subscore is 4. GCS verbal subscore is 5. GCS motor subscore is 6.     Cranial Nerves: No cranial nerve deficit.     Sensory: No sensory deficit.     Motor: Motor function is intact.  Psychiatric:        Attention and Perception: Attention normal.        Speech: Speech normal.        Behavior: Behavior normal.     ED Results / Procedures / Treatments   Labs (all labs ordered are listed, but only abnormal results are displayed) Labs Reviewed - No data to display  EKG None  Radiology No results found.  Procedures Procedures    Medications Ordered in ED Medications - No data to display  ED Course/ Medical Decision Making/ A&P                           Medical Decision Making Amount and/or Complexity of Data Reviewed Labs: ordered. Radiology: ordered.  Risk Prescription drug management.   Patient here after mechanical fall.  Has concern for possible hip fracture of the right side which was confirmed by x-ray of her right hip and pelvis.  He is neurovascular intact at her right foot.  Preop laboratory studies without significant abnormality.  Medicated for pain.  Will consult orthopedics and admit to the hospitalist service.        Final Clinical Impression(s) / ED Diagnoses Final diagnoses:  None    Rx / DC Orders ED Discharge Orders     None         Lacretia Leigh, MD 06/07/22 1256

## 2022-06-07 NOTE — Anesthesia Procedure Notes (Signed)
Anesthesia Regional Block: Peng block   Pre-Anesthetic Checklist: , timeout performed,  Correct Patient, Correct Site, Correct Laterality,  Correct Procedure, Correct Position, site marked,  Risks and benefits discussed,  Surgical consent,  Pre-op evaluation,  At surgeon's request and post-op pain management  Laterality: Right  Prep: chloraprep       Needles:  Injection technique: Single-shot  Needle Type: Echogenic Stimulator Needle     Needle Length: 10cm  Needle Gauge: 21     Additional Needles:   Procedures:,,,, ultrasound used (permanent image in chart),,    Narrative:  Start time: 06/07/2022 6:02 PM End time: 06/07/2022 6:12 PM Injection made incrementally with aspirations every 5 mL.  Performed by: Personally

## 2022-06-07 NOTE — H&P (View-Only) (Signed)
Reason for Consult:Right hip fracture/pain Referring Physician: Marylyn Ishihara MD  Krystal Moore is an 88 y.o. female.  HPI: 88 yo community ambulator who presents after ground level fall today when she lost her balance after have a branch hit her head. She fell and complained of immediate right hip pain and was unable to stand after her injury due to pain. She now has some left sided rib pain. She denies LOC, head or neck or back pain or other extremity pain.   Past Medical History:  Diagnosis Date   Anemia    Narcolepsy    Neuropathy    Obesity    Pneumonia    Polyneuropathy in other diseases classified elsewhere [facility])    Shoulder fracture, left     Past Surgical History:  Procedure Laterality Date   CATARACT EXTRACTION Left 06/2019   MOUTH SURGERY     NASAL SINUS SURGERY      Family History  Problem Relation Age of Onset   Stroke Mother        questionable   Clotting disorder Mother    Heart attack Father     Social History:  reports that she has never smoked. She has never used smokeless tobacco. She reports current alcohol use. She reports that she does not use drugs.  Allergies:  Allergies  Allergen Reactions   Elemental Sulfur Shortness Of Breath and Swelling   Milk-Related Compounds Nausea And Vomiting   Poison Ivy Extract [Poison Ivy Extract] Itching, Rash and Other (See Comments)    Muscle damage    Tobacco [Tobacco] Anaphylaxis and Swelling    Allergic to cigarette smoke   Cymbalta [Duloxetine Hcl] Diarrhea   Orange Juice [Orange Oil] Nausea And Vomiting    Medications: I have reviewed the patient's current medications.  Results for orders placed or performed during the hospital encounter of 06/07/22 (from the past 48 hour(s))  Type and screen [place]     Status: None   Collection Time: 06/07/22 11:36 AM  Result Value Ref Range   ABO/RH(D) O NEG    Antibody Screen NEG    Sample Expiration      06/10/2022,2359 Performed at  [facility], [place] [address] [place], [place] [address]   Basic metabolic panel     Status: Abnormal   Collection Time: 06/07/22 11:49 AM  Result Value Ref Range   Sodium 136 135 - 145 mmol/L   Potassium 4.0 3.5 - 5.1 mmol/L   Chloride 103 98 - 111 mmol/L   CO2 25 22 - 32 mmol/L   Glucose, Bld 124 (H) 70 - 99 mg/dL    Comment: Glucose reference range applies only to samples taken after fasting for at least 8 hours.   BUN 13 8 - 23 mg/dL   Creatinine, Ser 0.87 0.44 - 1.00 mg/dL   Calcium 9.3 8.9 - 10.3 mg/dL   GFR, Estimated >60 >60 mL/min    Comment: (NOTE) Calculated using the CKD-EPI Creatinine Equation (2021)    Anion gap 8 5 - 15    Comment: Performed at [facility], [place] [address], [place], [place] [address]  CBC with Differential     Status: None   Collection Time: 06/07/22 11:49 AM  Result Value Ref Range   WBC 4.7 4.0 - 10.5 K/uL   RBC 3.99 3.87 - 5.11 MIL/uL   Hemoglobin 12.6 12.0 - 15.0 g/dL   HCT 36.5 36.0 - 46.0 %   MCV 91.5 80.0 -  100.0 fL   MCH 31.6 26.0 - 34.0 pg   MCHC 34.5 30.0 - 36.0 g/dL   RDW 13.0 11.5 - 15.5 %   Platelets 209 150 - 400 K/uL   nRBC 0.0 0.0 - 0.2 %   Neutrophils Relative % 55 %   Neutro Abs 2.5 1.7 - 7.7 K/uL   Lymphocytes Relative 35 %   Lymphs Abs 1.6 0.7 - 4.0 K/uL   Monocytes Relative 5 %   Monocytes Absolute 0.3 0.1 - 1.0 K/uL   Eosinophils Relative 4 %   Eosinophils Absolute 0.2 0.0 - 0.5 K/uL   Basophils Relative 1 %   Basophils Absolute 0.0 0.0 - 0.1 K/uL   Immature Granulocytes 0 %   Abs Immature Granulocytes 0.02 0.00 - 0.07 K/uL    Comment: Performed at [facility], [place] [address], [place], [place] [address]  Protime-INR     Status: None   Collection Time: 06/07/22 11:49 AM  Result Value Ref Range   Prothrombin Time 13.3 11.4 - 15.2 seconds   INR 1.0 0.8 - 1.2    Comment: (NOTE) INR goal varies based on device and disease states. Performed at [facility], [place] [address], [place], [place] [address]     DG Chest Port 1 View  Result Date: 06/07/2022 CLINICAL DATA:  Preop imaging EXAM: PORTABLE CHEST 1 VIEW COMPARISON:  Chest x-ray dated July 15, 2014 FINDINGS: Cardiac and mediastinal contours unchanged. Large hiatal hernia, increased in size when compared with the prior exam. Mild left basilar opacity, likely due to atelectasis. No large pleural effusion or evidence of pneumothorax. Degenerative changes of the left-greater-than-right shoulders. IMPRESSION: 1.  Mild left basilar opacity, likely due to atelectasis. 2. Large hiatal hernia, increased in size when compared with the prior exam. Electronically Signed   By: Yetta Glassman M.D.   On: 06/07/2022 13:05   DG Hip Unilat With Pelvis 2-3 Views Right  Result Date: 06/07/2022 CLINICAL DATA:  Fall EXAM: DG HIP (WITH OR WITHOUT PELVIS) 2-3V RIGHT COMPARISON:  Hip radiographs 07/27/2021 FINDINGS: There is an acute mildly proximally displaced right intertrochanteric fracture. Femoroacetabular alignment is normal. The SI joints and symphysis pubis are intact. No acute fracture is seen on the left. A calcified uterine fibroid is again noted. IMPRESSION: Acute mildly proximally displaced right intertrochanteric fracture. Electronically Signed   By: Valetta Mole M.D.   On: 06/07/2022 12:32    Review of Systems Blood pressure (!) 167/65, pulse (!) 58, temperature 97.9 F (36.6 C), temperature source Oral, resp. rate 15, SpO2 96 %. Physical Exam Awake and alert, minimal distress on ED stretcher. HEENT, Cervical Thoracic and Lumbar spine non tender to palpation. No bony stepoffs.  Bilateral clavicles and shoulders stable and non swollen and non tender. Normal AROM and strength bilateral UEs Left chest wall is tender in the midaxillary line. Normal chest excursion. Abdomen scaphoid and soft. Non tender abdomen.  Pain free AROM of the left hip, knee and ankle. Distally NVI Able to  wiggle the ankle on the right but unable to assess ROM of the hip due to pain. The leg is short and externally rotated. Distally NVI  Assessment/Plan: Displaced right IT hip fracture after ground level fall. Patient is not on blood thinners. I have spoken with Dr Lyla Glassing who has time in the OR tomorrow for planned IM nailing of the femur.  Will make NPO after MN. Medical optimization overnight.  Patient understands and agrees  Augustin Schooling  06/07/2022, 4:37 PM

## 2022-06-07 NOTE — Progress Notes (Signed)
Orthopedic Tech Progress Note Patient Details:  Krystal Moore March 24, 1930 [id number]  Patient ID: Krystal Moore, female   DOB: 05-Mar-1930, 88 y.o.   MRN: [id number]  Krystal Moore 06/07/2022, 7:17 PM Patient could only tolerate 15lbs of bucks traction.

## 2022-06-07 NOTE — Consult Note (Signed)
Reason for Consult:Right hip fracture/pain Referring Physician: Marylyn Ishihara MD  Krystal Moore is an 88 y.o. female.  HPI: 88 yo community ambulator who presents after ground level fall today when she lost her balance after have a branch hit her head. She fell and complained of immediate right hip pain and was unable to stand after her injury due to pain. She now has some left sided rib pain. She denies LOC, head or neck or back pain or other extremity pain.   Past Medical History:  Diagnosis Date   Anemia    Narcolepsy    Neuropathy    Obesity    Pneumonia    Polyneuropathy in other diseases classified elsewhere [facility])    Shoulder fracture, left     Past Surgical History:  Procedure Laterality Date   CATARACT EXTRACTION Left 06/2019   MOUTH SURGERY     NASAL SINUS SURGERY      Family History  Problem Relation Age of Onset   Stroke Mother        questionable   Clotting disorder Mother    Heart attack Father     Social History:  reports that she has never smoked. She has never used smokeless tobacco. She reports current alcohol use. She reports that she does not use drugs.  Allergies:  Allergies  Allergen Reactions   Elemental Sulfur Shortness Of Breath and Swelling   Milk-Related Compounds Nausea And Vomiting   Poison Ivy Extract [Poison Ivy Extract] Itching, Rash and Other (See Comments)    Muscle damage    Tobacco [Tobacco] Anaphylaxis and Swelling    Allergic to cigarette smoke   Cymbalta [Duloxetine Hcl] Diarrhea   Orange Juice [Orange Oil] Nausea And Vomiting    Medications: I have reviewed the patient's current medications.  Results for orders placed or performed during the hospital encounter of 06/07/22 (from the past 48 hour(s))  Type and screen [place]     Status: None   Collection Time: 06/07/22 11:36 AM  Result Value Ref Range   ABO/RH(D) O NEG    Antibody Screen NEG    Sample Expiration      06/10/2022,2359 Performed at  [facility], [place] [address], [place], [place] [address]   Basic metabolic panel     Status: Abnormal   Collection Time: 06/07/22 11:49 AM  Result Value Ref Range   Sodium 136 135 - 145 mmol/L   Potassium 4.0 3.5 - 5.1 mmol/L   Chloride 103 98 - 111 mmol/L   CO2 25 22 - 32 mmol/L   Glucose, Bld 124 (H) 70 - 99 mg/dL    Comment: Glucose reference range applies only to samples taken after fasting for at least 8 hours.   BUN 13 8 - 23 mg/dL   Creatinine, Ser 0.87 0.44 - 1.00 mg/dL   Calcium 9.3 8.9 - 10.3 mg/dL   GFR, Estimated >60 >60 mL/min    Comment: (NOTE) Calculated using the CKD-EPI Creatinine Equation (2021)    Anion gap 8 5 - 15    Comment: Performed at [facility], [place] [address] [place], [place] [address]  CBC with Differential     Status: None   Collection Time: 06/07/22 11:49 AM  Result Value Ref Range   WBC 4.7 4.0 - 10.5 K/uL   RBC 3.99 3.87 - 5.11 MIL/uL   Hemoglobin 12.6 12.0 - 15.0 g/dL   HCT 36.5 36.0 - 46.0 %   MCV 91.5 80.0 -  100.0 fL   MCH 31.6 26.0 - 34.0 pg   MCHC 34.5 30.0 - 36.0 g/dL   RDW 13.0 11.5 - 15.5 %   Platelets 209 150 - 400 K/uL   nRBC 0.0 0.0 - 0.2 %   Neutrophils Relative % 55 %   Neutro Abs 2.5 1.7 - 7.7 K/uL   Lymphocytes Relative 35 %   Lymphs Abs 1.6 0.7 - 4.0 K/uL   Monocytes Relative 5 %   Monocytes Absolute 0.3 0.1 - 1.0 K/uL   Eosinophils Relative 4 %   Eosinophils Absolute 0.2 0.0 - 0.5 K/uL   Basophils Relative 1 %   Basophils Absolute 0.0 0.0 - 0.1 K/uL   Immature Granulocytes 0 %   Abs Immature Granulocytes 0.02 0.00 - 0.07 K/uL    Comment: Performed at [facility], [place] [address] [place], [place] [address]  Protime-INR     Status: None   Collection Time: 06/07/22 11:49 AM  Result Value Ref Range   Prothrombin Time 13.3 11.4 - 15.2 seconds   INR 1.0 0.8 - 1.2    Comment: (NOTE) INR goal varies based on device and disease states. Performed at [facility], [place] [address], [place], [place] [address]     DG Chest Port 1 View  Result Date: 06/07/2022 CLINICAL DATA:  Preop imaging EXAM: PORTABLE CHEST 1 VIEW COMPARISON:  Chest x-ray dated July 15, 2014 FINDINGS: Cardiac and mediastinal contours unchanged. Large hiatal hernia, increased in size when compared with the prior exam. Mild left basilar opacity, likely due to atelectasis. No large pleural effusion or evidence of pneumothorax. Degenerative changes of the left-greater-than-right shoulders. IMPRESSION: 1.  Mild left basilar opacity, likely due to atelectasis. 2. Large hiatal hernia, increased in size when compared with the prior exam. Electronically Signed   By: Yetta Glassman M.D.   On: 06/07/2022 13:05   DG Hip Unilat With Pelvis 2-3 Views Right  Result Date: 06/07/2022 CLINICAL DATA:  Fall EXAM: DG HIP (WITH OR WITHOUT PELVIS) 2-3V RIGHT COMPARISON:  Hip radiographs 07/27/2021 FINDINGS: There is an acute mildly proximally displaced right intertrochanteric fracture. Femoroacetabular alignment is normal. The SI joints and symphysis pubis are intact. No acute fracture is seen on the left. A calcified uterine fibroid is again noted. IMPRESSION: Acute mildly proximally displaced right intertrochanteric fracture. Electronically Signed   By: Valetta Mole M.D.   On: 06/07/2022 12:32    Review of Systems Blood pressure (!) 167/65, pulse (!) 58, temperature 97.9 F (36.6 C), temperature source Oral, resp. rate 15, SpO2 96 %. Physical Exam Awake and alert, minimal distress on ED stretcher. HEENT, Cervical Thoracic and Lumbar spine non tender to palpation. No bony stepoffs.  Bilateral clavicles and shoulders stable and non swollen and non tender. Normal AROM and strength bilateral UEs Left chest wall is tender in the midaxillary line. Normal chest excursion. Abdomen scaphoid and soft. Non tender abdomen.  Pain free AROM of the left hip, knee and ankle. Distally NVI Able to  wiggle the ankle on the right but unable to assess ROM of the hip due to pain. The leg is short and externally rotated. Distally NVI  Assessment/Plan: Displaced right IT hip fracture after ground level fall. Patient is not on blood thinners. I have spoken with Dr Lyla Glassing who has time in the OR tomorrow for planned IM nailing of the femur.  Will make NPO after MN. Medical optimization overnight.  Patient understands and agrees  Augustin Schooling  06/07/2022, 4:37 PM

## 2022-06-07 NOTE — ED Triage Notes (Signed)
BIBA  Per EMS: Pt coming from home w/ c/o fall while walking her dog. Right hip pain. Shortening and rotation noted. Denies thinners. 170mg of fentanyl given & '4mg'$  zofran given en route. VSS.

## 2022-06-07 NOTE — Anesthesia Preprocedure Evaluation (Addendum)
Anesthesia Evaluation  Patient identified by MRN, date of birth, ID band Patient awake    Reviewed: Allergy & Precautions, NPO status , Patient's Chart, lab work & pertinent test results  History of Anesthesia Complications Negative for: history of anesthetic complications  Airway Mallampati: III  TM Distance: >3 FB     Dental  (+) Poor Dentition, Dental Advisory Given, Chipped, Missing, Edentulous Upper,    Pulmonary neg shortness of breath, neg sleep apnea, neg COPD, neg recent URI,    breath sounds clear to auscultation       Cardiovascular hypertension, Pt. on medications (-) angina(-) Past MI and (-) CHF  Rhythm:Regular     Neuro/Psych  Neuromuscular disease negative psych ROS   GI/Hepatic negative GI ROS, Neg liver ROS,   Endo/Other  Hypothyroidism   Renal/GU negative Renal ROSLab Results      Component                Value               Date                      CREATININE               0.87                06/07/2022               Musculoskeletal  (+) Arthritis ,   Abdominal   Peds  Hematology negative hematology ROS (+) Lab Results      Component                Value               Date                      WBC                      4.7                 06/07/2022                HGB                      12.6                06/07/2022                HCT                      36.5                06/07/2022                MCV                      91.5                06/07/2022                PLT                      209                 06/07/2022           Denies blood thinners  Lab Results  Component                Value               Date                      INR                      1.0                 06/07/2022              Anesthesia Other Findings All: cymbalta  Reproductive/Obstetrics                            Anesthesia Physical Anesthesia Plan  ASA: 3  Anesthesia  Plan: Spinal   Post-op Pain Management: Regional block* and Minimal or no pain anticipated   Induction:   PONV Risk Score and Plan: 2 and Treatment may vary due to age or medical condition and Ondansetron  Airway Management Planned: Nasal Cannula, Natural Airway and Simple Face Mask  Additional Equipment: None  Intra-op Plan:   Post-operative Plan:   Informed Consent: I have reviewed the patients History and Physical, chart, labs and discussed the procedure including the risks, benefits and alternatives for the proposed anesthesia with the patient or authorized representative who has indicated his/her understanding and acceptance.     Dental advisory given  Plan Discussed with:   Anesthesia Plan Comments: (R IM nail)       Anesthesia Quick Evaluation

## 2022-06-07 NOTE — Anesthesia Preprocedure Evaluation (Addendum)
Anesthesia Evaluation  Patient identified by MRN, date of birth, ID band Patient awake    Reviewed: Allergy & Precautions, NPO status , Patient's Chart, lab work & pertinent test results  History of Anesthesia Complications Negative for: history of anesthetic complications  Airway Mallampati: II  TM Distance: >3 FB Neck ROM: Full    Dental  (+) Poor Dentition, Dental Advisory Given, Upper Dentures   Pulmonary neg pulmonary ROS,    Pulmonary exam normal        Cardiovascular hypertension, Pt. on medications Normal cardiovascular exam     Neuro/Psych negative neurological ROS     GI/Hepatic negative GI ROS, Neg liver ROS,   Endo/Other  Hypothyroidism   Renal/GU negative Renal ROS     Musculoskeletal Hip Fx   Abdominal   Peds  Hematology negative hematology ROS (+)   Anesthesia Other Findings   Reproductive/Obstetrics                           Anesthesia Physical Anesthesia Plan  ASA: 3  Anesthesia Plan: MAC   Post-op Pain Management: Regional block*   Induction:   PONV Risk Score and Plan: 2  Airway Management Planned: Natural Airway  Additional Equipment:   Intra-op Plan:   Post-operative Plan:   Informed Consent: I have reviewed the patients History and Physical, chart, labs and discussed the procedure including the risks, benefits and alternatives for the proposed anesthesia with the patient or authorized representative who has indicated his/her understanding and acceptance.     Dental advisory given  Plan Discussed with: Anesthesiologist  Anesthesia Plan Comments:        Anesthesia Quick Evaluation

## 2022-06-07 NOTE — ED Notes (Signed)
Pt transported to receive nerve block

## 2022-06-07 NOTE — H&P (Signed)
History and Physical    Patient: Krystal Moore QTM:[id number] DOB: 1930/04/17 DOA: 06/07/2022 DOS: the patient was seen and examined on 06/07/2022 PCP: Ladell Pier, MD  Patient coming from: Home  Chief Complaint:  Chief Complaint  Patient presents with   Fall   Hip Pain   HPI: Krystal Moore is a 88 y.o. female with medical history significant of hypothyroidism. Presenting with right hip pain after fall. She reports that she was in her normal state of health prior to her fall today. She was walking outside and ran into a tree branch. It grazed the top of her head and caused her to lose her balance. She felt to her right side and was unable to get up on her own. She did not hit her head against the ground. She did not suffer LOC. She didn't have CP, palpitations, lightheadedness or dizziness prior to her fall. Her neighbors saw the incident as it happened. They called for EMS. She denies any other aggravating or alleviating factors.   Review of Systems: As mentioned in the history of present illness. All other systems reviewed and are negative. Past Medical History:  Diagnosis Date   Anemia    Narcolepsy    Neuropathy    Obesity    Pneumonia    Polyneuropathy in other diseases classified elsewhere [facility])    Shoulder fracture, left    Past Surgical History:  Procedure Laterality Date   CATARACT EXTRACTION Left 06/2019   MOUTH SURGERY     NASAL SINUS SURGERY     Social History:  reports that she has never smoked. She has never used smokeless tobacco. She reports current alcohol use. She reports that she does not use drugs.  Allergies  Allergen Reactions   Elemental Sulfur Shortness Of Breath and Swelling   Milk-Related Compounds Nausea And Vomiting   Poison Ivy Extract [Poison Ivy Extract] Itching, Rash and Other (See Comments)    Muscle damage    Tobacco [Tobacco] Anaphylaxis and Swelling    Allergic to cigarette smoke   Cymbalta [Duloxetine Hcl]  Diarrhea   Orange Juice [Orange Oil] Nausea And Vomiting    Family History  Problem Relation Age of Onset   Stroke Mother        questionable   Clotting disorder Mother    Heart attack Father     Prior to Admission medications   Medication Sig Start Date End Date Taking? Authorizing Provider  amLODipine (NORVASC) 5 MG tablet TAKE 1 TABLET(5 MG) BY MOUTH DAILY FOR BLOOD PRESSURE 03/27/22   Ladell Pier, MD  betamethasone dipropionate 0.05 % lotion Apply 1 application topically 2 (two) times a week. 01/23/20   [provider]  Cyanocobalamin 5000 MCG CAPS Take 5,000 mcg by mouth daily. Reported on 10/11/2015 10/11/15   Boykin Nearing, MD  furosemide (LASIX) 20 MG tablet TAKE 1 TABLET BY MOUTH EVERY DAY FOR 5 DAYS THEN 1 EVERY DAY AS NEEDED FOR SWELLING(USE SPARINGLY) 04/24/22   Ladell Pier, MD  hydrochlorothiazide (HYDRODIURIL) 12.5 MG tablet TAKE 1 TABLET BY MOUTH DAILY AS NEEDED FOR LOWER EXTREMITY SWELLING 03/25/21   Ladell Pier, MD  levothyroxine (SYNTHROID) 112 MCG tablet TAKE 1 TABLET BY MOUTH EVERY DAY BEFORE BREAKFAST 01/02/22   Ladell Pier, MD  LUTEIN PO Take 1 tablet by mouth daily.    [provider]  Multiple Vitamins-Minerals (MULTIVITAMIN PO) Take 1 tablet by mouth daily.     [provider]  OVER THE COUNTER  MEDICATION Take 1 tablet by mouth daily as needed (cold symtpoms). Reported on 10/11/2015    [provider]  predniSONE (DELTASONE) 10 MG tablet Take 1 tablet (10 mg total) by mouth daily with breakfast. 08/04/21   Ladell Pier, MD  sodium chloride (OCEAN) 0.65 % SOLN nasal spray Place 1 spray into both nostrils as needed for congestion. Reported on 10/11/2015    [provider]  tiZANidine (ZANAFLEX) 4 MG tablet TAKE 1 TABLET(4 MG) BY MOUTH EVERY 8 HOURS AS NEEDED FOR MUSCLE SPASMS 01/25/22   Ladell Pier, MD  traMADol (ULTRAM) 50 MG tablet Take 1 tablet (50 mg total) by mouth every 8 (eight) hours  as needed. 08/24/21   Ladell Pier, MD    Physical Exam: Vitals:   06/07/22 1135 06/07/22 1145 06/07/22 1245  BP: (!) 170/67 (!) 167/65   Pulse: 73 (!) 58 (!) 58  Resp: '15 14 15  ' Temp: 97.9 F (36.6 C)    TempSrc: Oral    SpO2: 94% 96% 96%   General: 88 y.o. female resting in bed in NAD Eyes: PERRL, normal sclera ENMT: Nares patent w/o discharge, orophaynx clear, dentition poor, ears w/o discharge/lesions/ulcers Neck: Supple, trachea midline Cardiovascular: RRR, +S1, S2, no m/g/r, equal pulses throughout Respiratory: CTABL, no w/r/r, normal WOB GI: BS+, NDNT, no masses noted, no organomegaly noted MSK: No e/c/c; right hip ROM limited d/t pain Neuro: A&O x 3, no focal deficits Psyc: Appropriate interaction and affect, calm/cooperative  Data Reviewed:  Lab Results  Component Value Date   NA 136 06/07/2022   K 4.0 06/07/2022   CO2 25 06/07/2022   GLUCOSE 124 (H) 06/07/2022   BUN 13 06/07/2022   CREATININE 0.87 06/07/2022   CALCIUM 9.3 06/07/2022   EGFR 70 12/28/2021   GFRNONAA >60 06/07/2022   Lab Results  Component Value Date   WBC 4.7 06/07/2022   HGB 12.6 06/07/2022   HCT 36.5 06/07/2022   MCV 91.5 06/07/2022   PLT 209 06/07/2022   CXR: 1.  Mild left basilar opacity, likely due to atelectasis. 2. Large hiatal hernia, increased in size when compared with the prior exam.  XR Right Hip Acute mildly proximally displaced right intertrochanteric fracture.  Assessment and Plan: Fall Right hip fracture     - admit to inpt, med-surg     - [facility] to see; keep NPO until ortho eval's     - pain control, anti-emetics     - PT/OT after ortho evals  Hypothyroidism     - resume home regimen when confirmed  Advance Care Planning:  Code Status: FULL  Consults: [facility]  Family Communication: None at bedside  Severity of Illness: The appropriate patient status for this patient is INPATIENT. Inpatient status is judged to be reasonable and necessary  in order to provide the required intensity of service to ensure the patient's safety. The patient's presenting symptoms, physical exam findings, and initial radiographic and laboratory data in the context of their chronic comorbidities is felt to place them at high risk for further clinical deterioration. Furthermore, it is not anticipated that the patient will be medically stable for discharge from the hospital within 2 midnights of admission.   * I certify that at the point of admission it is my clinical judgment that the patient will require inpatient hospital care spanning beyond 2 midnights from the point of admission due to high intensity of service, high risk for further deterioration and high frequency of surveillance required.*  Author: Jonnie Finner, DO 06/07/2022 1:17 PM  For on call review [email or web address].

## 2022-06-08 ENCOUNTER — Inpatient Hospital Stay (HOSPITAL_COMMUNITY): Payer: Medicare HMO | Admitting: Anesthesiology

## 2022-06-08 ENCOUNTER — Encounter (HOSPITAL_COMMUNITY): Payer: Self-pay | Admitting: Internal Medicine

## 2022-06-08 ENCOUNTER — Inpatient Hospital Stay (HOSPITAL_COMMUNITY): Payer: Medicare HMO

## 2022-06-08 ENCOUNTER — Encounter (HOSPITAL_COMMUNITY)
Admission: EM | Disposition: A | Discharge status: Skilled Nursing Facility | Payer: Self-pay | Source: Home / Self Care | Attending: Family Medicine

## 2022-06-08 DIAGNOSIS — S72141A Displaced intertrochanteric fracture of right femur, initial encounter for closed fracture: Secondary | ICD-10-CM | POA: Diagnosis not present

## 2022-06-08 DIAGNOSIS — M1990 Unspecified osteoarthritis, unspecified site: Secondary | ICD-10-CM

## 2022-06-08 DIAGNOSIS — E039 Hypothyroidism, unspecified: Secondary | ICD-10-CM | POA: Diagnosis not present

## 2022-06-08 DIAGNOSIS — I10 Essential (primary) hypertension: Secondary | ICD-10-CM

## 2022-06-08 DIAGNOSIS — S72001A Fracture of unspecified part of neck of right femur, initial encounter for closed fracture: Secondary | ICD-10-CM | POA: Diagnosis not present

## 2022-06-08 HISTORY — PX: INTRAMEDULLARY (IM) NAIL INTERTROCHANTERIC: SHX5875

## 2022-06-08 LAB — SURGICAL PCR SCREEN
MRSA, PCR: NEGATIVE
Staphylococcus aureus: POSITIVE — AB

## 2022-06-08 SURGERY — FIXATION, FRACTURE, INTERTROCHANTERIC, WITH INTRAMEDULLARY ROD
Anesthesia: Spinal | Laterality: Right

## 2022-06-08 MED ORDER — BUPIVACAINE IN DEXTROSE 0.75-8.25 % IT SOLN
INTRATHECAL | Status: DC | PRN
  Administered 2022-06-08: 1.6 mL via INTRATHECAL

## 2022-06-08 MED ORDER — SENNA 8.6 MG PO TABS
1.0000 | ORAL_TABLET | Freq: Two times a day (BID) | ORAL | Status: DC
  Administered 2022-06-08 – 2022-06-14 (×10): 8.6 mg via ORAL
  Filled 2022-06-08 (×12): qty 1

## 2022-06-08 MED ORDER — DEXAMETHASONE SODIUM PHOSPHATE 10 MG/ML IJ SOLN
INTRAMUSCULAR | Status: AC
  Filled 2022-06-08: qty 1

## 2022-06-08 MED ORDER — ISOPROPYL ALCOHOL 70 % SOLN
Status: AC
  Filled 2022-06-08: qty 480

## 2022-06-08 MED ORDER — EPHEDRINE SULFATE-NACL 50-0.9 MG/10ML-% IV SOSY
PREFILLED_SYRINGE | INTRAVENOUS | Status: DC | PRN
  Administered 2022-06-08: 5 mg via INTRAVENOUS

## 2022-06-08 MED ORDER — METOCLOPRAMIDE HCL 5 MG/ML IJ SOLN: 5.0000 mg | Freq: Three times a day (TID) | INTRAMUSCULAR | Status: DC | PRN

## 2022-06-08 MED ORDER — FENTANYL CITRATE (PF) 100 MCG/2ML IJ SOLN
INTRAMUSCULAR | Status: DC | PRN
Start: 2022-06-08 — End: 2022-06-08
  Administered 2022-06-08 (×2): 25 ug via INTRAVENOUS

## 2022-06-08 MED ORDER — FENTANYL CITRATE (PF) 100 MCG/2ML IJ SOLN
INTRAMUSCULAR | Status: AC
  Filled 2022-06-08: qty 2

## 2022-06-08 MED ORDER — CHLORHEXIDINE GLUCONATE CLOTH 2 % EX PADS
6.0000 | MEDICATED_PAD | Freq: Every day | CUTANEOUS | Status: AC
  Administered 2022-06-09 – 2022-06-13 (×5): 6 via TOPICAL

## 2022-06-08 MED ORDER — ONDANSETRON HCL 4 MG PO TABS
4.0000 mg | ORAL_TABLET | Freq: Four times a day (QID) | ORAL | Status: DC | PRN
  Administered 2022-06-09 – 2022-06-11 (×2): 4 mg via ORAL
  Filled 2022-06-08 (×2): qty 1

## 2022-06-08 MED ORDER — ONDANSETRON HCL 4 MG/2ML IJ SOLN
INTRAMUSCULAR | Status: AC
  Filled 2022-06-08: qty 2

## 2022-06-08 MED ORDER — MUPIROCIN 2 % EX OINT
1.0000 | TOPICAL_OINTMENT | Freq: Two times a day (BID) | CUTANEOUS | Status: AC
  Administered 2022-06-08 – 2022-06-13 (×10): 1 via NASAL
  Filled 2022-06-08: qty 22

## 2022-06-08 MED ORDER — PHENYLEPHRINE HCL-NACL 20-0.9 MG/250ML-% IV SOLN
INTRAVENOUS | Status: DC | PRN
  Administered 2022-06-08: 25 ug/min via INTRAVENOUS

## 2022-06-08 MED ORDER — METHOCARBAMOL 1000 MG/10ML IJ SOLN: 500.0000 mg | Freq: Four times a day (QID) | INTRAVENOUS | Status: DC | PRN

## 2022-06-08 MED ORDER — FENTANYL CITRATE PF 50 MCG/ML IJ SOSY: 25.0000 ug | PREFILLED_SYRINGE | INTRAMUSCULAR | Status: DC | PRN

## 2022-06-08 MED ORDER — SODIUM CHLORIDE 0.9 % IR SOLN
Status: DC | PRN
  Administered 2022-06-08: 1000 mL

## 2022-06-08 MED ORDER — PHENOL 1.4 % MT LIQD: 1.0000 | OROMUCOSAL | Status: DC | PRN

## 2022-06-08 MED ORDER — CEFAZOLIN SODIUM-DEXTROSE 2-4 GM/100ML-% IV SOLN
2.0000 g | Freq: Four times a day (QID) | INTRAVENOUS | Status: AC
  Administered 2022-06-08 – 2022-06-09 (×2): 2 g via INTRAVENOUS
  Filled 2022-06-08 (×2): qty 100

## 2022-06-08 MED ORDER — PROPOFOL 1000 MG/100ML IV EMUL
INTRAVENOUS | Status: AC
  Filled 2022-06-08: qty 100

## 2022-06-08 MED ORDER — METOCLOPRAMIDE HCL 5 MG PO TABS: 5.0000 mg | ORAL_TABLET | Freq: Three times a day (TID) | ORAL | Status: DC | PRN

## 2022-06-08 MED ORDER — MENTHOL 3 MG MT LOZG
1.0000 | LOZENGE | OROMUCOSAL | Status: DC | PRN
Start: 2022-06-08 — End: 2022-06-14

## 2022-06-08 MED ORDER — PHENYLEPHRINE HCL (PRESSORS) 10 MG/ML IV SOLN
INTRAVENOUS | Status: AC
  Filled 2022-06-08: qty 1

## 2022-06-08 MED ORDER — ADULT MULTIVITAMIN W/MINERALS CH: 1.0000 | ORAL_TABLET | Freq: Every day | ORAL | Status: DC

## 2022-06-08 MED ORDER — CHLORHEXIDINE GLUCONATE 0.12 % MT SOLN
15.0000 mL | Freq: Once | OROMUCOSAL | Status: AC
  Administered 2022-06-08: 15 mL via OROMUCOSAL

## 2022-06-08 MED ORDER — SALINE SPRAY 0.65 % NA SOLN
1.0000 | NASAL | Status: DC | PRN
  Filled 2022-06-08: qty 44

## 2022-06-08 MED ORDER — ACETAMINOPHEN 10 MG/ML IV SOLN: 1000.0000 mg | Freq: Once | INTRAVENOUS | Status: DC | PRN

## 2022-06-08 MED ORDER — METHOCARBAMOL 1000 MG/10ML IJ SOLN
500.0000 mg | Freq: Three times a day (TID) | INTRAVENOUS | Status: DC | PRN
  Filled 2022-06-08: qty 5

## 2022-06-08 MED ORDER — ONDANSETRON HCL 4 MG/2ML IJ SOLN: 4.0000 mg | Freq: Four times a day (QID) | INTRAMUSCULAR | Status: DC | PRN

## 2022-06-08 MED ORDER — ASPIRIN 325 MG PO TBEC
325.0000 mg | DELAYED_RELEASE_TABLET | Freq: Every day | ORAL | Status: DC
  Administered 2022-06-09 – 2022-06-14 (×6): 325 mg via ORAL
  Filled 2022-06-08 (×6): qty 1

## 2022-06-08 MED ORDER — ACETAMINOPHEN 500 MG PO TABS: 1000.0000 mg | ORAL_TABLET | Freq: Once | ORAL | Status: DC | PRN

## 2022-06-08 MED ORDER — LACTATED RINGERS IV SOLN: INTRAVENOUS | Status: DC

## 2022-06-08 MED ORDER — DOCUSATE SODIUM 100 MG PO CAPS
100.0000 mg | ORAL_CAPSULE | Freq: Two times a day (BID) | ORAL | Status: DC
  Administered 2022-06-08 – 2022-06-14 (×11): 100 mg via ORAL
  Filled 2022-06-08 (×12): qty 1

## 2022-06-08 MED ORDER — ISOPROPYL ALCOHOL 70 % SOLN
Status: DC | PRN
  Administered 2022-06-08: 1 via TOPICAL

## 2022-06-08 MED ORDER — ENOXAPARIN SODIUM 30 MG/0.3ML IJ SOSY
30.0000 mg | PREFILLED_SYRINGE | INTRAMUSCULAR | Status: DC
  Administered 2022-06-09: 30 mg via SUBCUTANEOUS
  Filled 2022-06-08: qty 0.3

## 2022-06-08 MED ORDER — METHOCARBAMOL 500 MG PO TABS
500.0000 mg | ORAL_TABLET | Freq: Four times a day (QID) | ORAL | Status: DC | PRN
  Administered 2022-06-08 – 2022-06-13 (×7): 500 mg via ORAL
  Filled 2022-06-08 (×8): qty 1

## 2022-06-08 MED ORDER — ACETAMINOPHEN 160 MG/5ML PO SOLN: 1000.0000 mg | Freq: Once | ORAL | Status: DC | PRN

## 2022-06-08 MED ORDER — DEXAMETHASONE SODIUM PHOSPHATE 10 MG/ML IJ SOLN
INTRAMUSCULAR | Status: DC | PRN
  Administered 2022-06-08: 8 mg via INTRAVENOUS

## 2022-06-08 MED ORDER — ONDANSETRON HCL 4 MG/2ML IJ SOLN
INTRAMUSCULAR | Status: DC | PRN
  Administered 2022-06-08: 4 mg via INTRAVENOUS

## 2022-06-08 MED ORDER — BOOST / RESOURCE BREEZE PO LIQD CUSTOM
1.0000 | Freq: Two times a day (BID) | ORAL | Status: DC
  Administered 2022-06-08 – 2022-06-10 (×4): 1 via ORAL

## 2022-06-08 MED ORDER — PROPOFOL 500 MG/50ML IV EMUL
INTRAVENOUS | Status: DC | PRN
  Administered 2022-06-08: 10 mg via INTRAVENOUS
  Administered 2022-06-08: 40 ug/kg/min via INTRAVENOUS
  Administered 2022-06-08: 20 mg via INTRAVENOUS

## 2022-06-08 SURGICAL SUPPLY — 45 items
BAG COUNTER SPONGE SURGICOUNT (BAG) IMPLANT
BAG ZIPLOCK 12X15 (MISCELLANEOUS) IMPLANT
BIT DRILL CANN LG 4.3MM (BIT) IMPLANT
CHLORAPREP W/TINT 26 (MISCELLANEOUS) ×1 IMPLANT
COVER PERINEAL POST (MISCELLANEOUS) ×1 IMPLANT
COVER SURGICAL LIGHT HANDLE (MISCELLANEOUS) ×1 IMPLANT
DERMABOND ADVANCED (GAUZE/BANDAGES/DRESSINGS) ×1
DERMABOND ADVANCED .7 DNX12 (GAUZE/BANDAGES/DRESSINGS) ×2 IMPLANT
DRAPE C-ARM 42X120 X-RAY (DRAPES) ×1 IMPLANT
DRAPE C-ARMOR (DRAPES) ×1 IMPLANT
DRAPE IMP U-DRAPE 54X76 (DRAPES) ×2 IMPLANT
DRAPE SHEET LG 3/4 BI-LAMINATE (DRAPES) ×2 IMPLANT
DRAPE STERI IOBAN 125X83 (DRAPES) ×1 IMPLANT
DRAPE U-SHAPE 47X51 STRL (DRAPES) ×2 IMPLANT
DRESSING MEPILEX FLEX 4X4 (GAUZE/BANDAGES/DRESSINGS) ×2 IMPLANT
DRILL BIT CANN LG 4.3MM (BIT) ×1
DRSG MEPILEX FLEX 4X4 (GAUZE/BANDAGES/DRESSINGS) ×1
DRSG MEPILEX POST OP 4X8 (GAUZE/BANDAGES/DRESSINGS) IMPLANT
ELECT BLADE TIP CTD 4 INCH (ELECTRODE) IMPLANT
FACESHIELD WRAPAROUND (MASK) ×2 IMPLANT
FACESHIELD WRAPAROUND OR TEAM (MASK) ×2 IMPLANT
GAUZE SPONGE 4X4 12PLY STRL (GAUZE/BANDAGES/DRESSINGS) ×1 IMPLANT
GLOVE BIO SURGEON STRL SZ8.5 (GLOVE) ×2 IMPLANT
GLOVE BIOGEL M 7.0 STRL (GLOVE) ×1 IMPLANT
GLOVE BIOGEL PI IND STRL 7.5 (GLOVE) ×1 IMPLANT
GLOVE BIOGEL PI IND STRL 8 (GLOVE) ×1 IMPLANT
GLOVE BIOGEL PI IND STRL 8.5 (GLOVE) ×1 IMPLANT
GLOVE BIOGEL PI INDICATOR 7.5 (GLOVE) ×1
GLOVE BIOGEL PI INDICATOR 8 (GLOVE) ×1
GLOVE BIOGEL PI INDICATOR 8.5 (GLOVE) ×1
GLOVE SURG LX 7.5 STRW (GLOVE) ×2
GLOVE SURG LX STRL 7.5 STRW (GLOVE) ×2 IMPLANT
GOWN SPEC L3 XXLG W/TWL (GOWN DISPOSABLE) ×1 IMPLANT
GUIDEPIN 3.2X17.5 THRD DISP (PIN) IMPLANT
HFN 125 DEG 11MM X 180MM (Orthopedic Implant) IMPLANT
KIT BASIN OR (CUSTOM PROCEDURE TRAY) ×1 IMPLANT
MANIFOLD NEPTUNE II (INSTRUMENTS) ×1 IMPLANT
MARKER SKIN DUAL TIP RULER LAB (MISCELLANEOUS) ×1 IMPLANT
PACK GENERAL/GYN (CUSTOM PROCEDURE TRAY) ×1 IMPLANT
SCREW BONE CORTICAL 5.0X3 (Screw) IMPLANT
SCREW LAG HIP NAIL 10.5X95 (Screw) IMPLANT
STAPLER VISISTAT 35W (STAPLE) IMPLANT
SUT MNCRL AB 3-0 PS2 18 (SUTURE) ×1 IMPLANT
SUT VIC AB 1 CT1 36 (SUTURE) ×1 IMPLANT
TOWEL OR 17X26 10 PK STRL BLUE (TOWEL DISPOSABLE) ×1 IMPLANT

## 2022-06-08 NOTE — Anesthesia Procedure Notes (Addendum)
Spinal  Patient location during procedure: OR Start time: 06/08/2022 12:09 PM End time: 06/08/2022 12:19 PM Reason for block: surgical anesthesia Staffing Performed: anesthesiologist  Anesthesiologist: Oleta Mouse, MD Performed by: Oleta Mouse, MD Authorized by: Oleta Mouse, MD   Preanesthetic Checklist Completed: patient identified, IV checked, risks and benefits discussed, surgical consent, monitors and equipment checked, pre-op evaluation and timeout performed Spinal Block Patient position: sitting Prep: DuraPrep Patient monitoring: heart rate, cardiac monitor, continuous pulse ox and blood pressure Approach: midline Location: L3-4 Injection technique: single-shot Needle Needle type: Pencan  Needle gauge: 24 G Needle length: 9 cm Assessment Sensory level: T6 Events: CSF return Additional Notes Failed attempt at L4/5

## 2022-06-08 NOTE — Progress Notes (Addendum)
Initial Nutrition Assessment  DOCUMENTATION CODES:   Not applicable  INTERVENTION:  - diet advancement as medically feasible. - will order Boost Breeze BID, each supplement provides 250 kcal and 9 grams of protein. - will order 1 tablet multivitamin with minerals/day. - complete NFPE when feasible.   Noted allergy to milk-related compounds   NUTRITION DIAGNOSIS:   Increased nutrient needs related to hip fracture, post-op healing as evidenced by estimated needs.  GOAL:   Patient will meet greater than or equal to 90% of their needs  MONITOR:   Diet advancement, PO intake, Supplement acceptance, Labs, Weight trends, Skin  REASON FOR ASSESSMENT:   Consult Hip fracture protocol  ASSESSMENT:   88 y.o. female with medical history of hypothyroidism, narcolepsy, neuropathy, anemia, and polyneuropathy. She presented to the ED after a fall with subsequent R hip pain. She was admitted due to closed R hip fracture.  Patient is out of the room to OR for R femur IM nail.  He has not been seen by a [facility] RD at any time in the past.   Weight today is 164 lb and weight is up vs stable since 01/2021. No information documented in the edema section of flow sheet this hospitalization.    Labs reviewed. Medications reviewed; 3 mg melatonin/night.    NUTRITION - FOCUSED PHYSICAL EXAM:  Patient out of the room to OR.  Diet Order:   Diet Order             Diet NPO time specified Except for: Sips with Meds  Diet effective midnight                   EDUCATION NEEDS:   No education needs have been identified at this time  Skin:  Skin Assessment: Reviewed RN Assessment  Last BM:  PTA/unknown  Height:   Ht Readings from Last 1 Encounters:  06/08/22 '5\' 3"'$  (1.6 m)    Weight:   Wt Readings from Last 1 Encounters:  06/08/22 74.3 kg     BMI:  Body mass index is 29.02 kg/m.  Estimated Nutritional Needs:  Kcal:  1600-1800 kcal Protein:  80-90 grams Fluid:   >/= 1.8 L/day      Jarome Matin, MS, RD, LDN, CNSC Registered Dietitian II Inpatient Clinical Nutrition RD pager # and on-call/weekend pager # available in [facility]

## 2022-06-08 NOTE — Op Note (Signed)
OPERATIVE REPORT  SURGEON: Rod Can, MD   ASSISTANT: Larene Pickett, PA-C.  PREOPERATIVE DIAGNOSIS: Right intertrochanteric femur fracture.   POSTOPERATIVE DIAGNOSIS: Right intertrochanteric femur fracture.   PROCEDURE: Intramedullary fixation, Right femur.   IMPLANTS: Biomet Affixus Hip Fracture Nail, 11 by 180 mm, 125 degrees. 10.5 x 95 mm Hip Fracture Nail Lag Screw. 5 x 34 mm distal interlocking screw 1.  ANESTHESIA:  MAC and Spinal  ESTIMATED BLOOD LOSS:-150 mL    ANTIBIOTICS: 2 g Ancef.  DRAINS: None.  COMPLICATIONS: None.   CONDITION: PACU - hemodynamically stable.Marland Kitchen   BRIEF CLINICAL NOTE: Krystal Moore is a 88 y.o. female who presented with an intertrochanteric femur fracture. The patient was admitted to the hospitalist service and underwent perioperative risk stratification and medical optimization. The risks, benefits, and alternatives to the procedure were explained, and the patient elected to proceed.  PROCEDURE IN DETAIL: Surgical site was marked by myself. The patient was taken to the operating room and anesthesia was induced on the bed. The patient was then transferred to the [facility] table and the nonoperative lower extremity was scissored underneath the operative side. The fracture was reduced with traction, internal rotation, and adduction. The hip was prepped and draped in the normal sterile surgical fashion. Timeout was called verifying side and site of surgery. Preop antibiotics were given with 60 minutes of beginning the procedure.  Fluoroscopy was used to define the patient's anatomy. A 4 cm incision was made just proximal to the tip of the greater trochanter. The awl was used to obtain the standard starting point for a trochanteric entry nail under fluoroscopic control. The guidepin was placed. The entry reamer was used to open the proximal femur.  On the back table, the nail was assembled onto the jig. The nail was placed into the femur without any  difficulty. Through a separate stab incision, the cannula was placed down to the bone in preparation for the cephalomedullary device. A guidepin was placed into the femoral head using AP and lateral fluoroscopy views. The pin was measured, and then reaming was performed to the appropriate depth. The lag screw was inserted to the appropriate depth. The fracture was compressed through the jig. The setscrew was tightened and then loosened one quarter turn. A separate stab incision was created, and the distal interlocking screw was placed using standard AO technique. The jig was removed. Final AP and lateral fluoroscopy views were obtained to confirm fracture reduction and hardware placement. Tip apex distance was appropriate. There was no chondral penetration.  The wounds were copiously irrigated with saline. The wound was closed in layers with #1 Vicryl for the fascia, 2-0 Monocryl for the deep dermal layer, and and staples + Dermabond for the skin. Once the glue was fully hardened, sterile dressing was applied. The patient was then awakened from anesthesia and taken to the PACU in stable condition. Sponge needle and instrument counts were correct at the end of the case 2. There were no known complications.  Please note that a surgical assistant was a medical necessity for this procedure to perform it in a safe and expeditious manner. Assistant was necessary to provide appropriate retraction of vital neurovascular structures, to prevent femoral fracture, and to allow for anatomic placement of the prosthesis.  We will readmit the patient to the hospitalist. Weightbearing status will be weightbearing as tolerated with a walker. We will begin ASA for DVT prophylaxis. The patient will work with physical therapy and undergo disposition planning.

## 2022-06-08 NOTE — Progress Notes (Signed)
PROGRESS NOTE    Krystal Moore  PPJ:[id number] DOB: 18-Mar-1930 DOA: 06/07/2022 PCP: Ladell Pier, MD    Chief Complaint  Patient presents with   Fall   Hip Pain    Brief Narrative:     Krystal Moore is a 88 y.o. female with medical history significant of hypothyroidism. Presenting with right hip pain after fall. She reports that she was in her normal state of health prior to her fall today. She was walking outside and ran into a tree branch. It grazed the top of her head and caused her to lose her balance. She felt to her right side and was unable to get up on her own. She did not hit her head against the ground. She did not suffer LOC. She didn't have CP, palpitations, lightheadedness or dizziness prior to her fall. Her neighbors saw the incident as it happened. They called for EMS.  Her work-up is significant for right hip fracture, she is admitted for further management  Assessment & Plan:   Principal Problem:   Closed right hip fracture [facility]) Active Problems:   Hypothyroidism   Fall at home, initial encounter  Fall Right hip fracture -Management per orthopedic, patient went for surgical repair today, status post intramedullary fixation of right femur by Dr. Lyla Glassing -Continue with as needed pain and nausea medication -PT/OT for further recommendations   Hypothyroidism     - resume home regimen when confirmed  overweight Body mass index is 29.02 kg/m.  DVT prophylaxis: SCD >> lovenox Code Status: Full Family Communication: none at bedside Disposition:   Status is: Inpatient    Consultants:  Orthopedic  Subjective:  Reports pain is controlled, but she is complaining of spasm at hip fracture site Objective: Vitals:   06/08/22 0634 06/08/22 0935 06/08/22 1031 06/08/22 1045  BP: (!) 104/44 (!) 120/52 (!) 153/58   Pulse: (!) 54 (!) 53 65   Resp: '17 14 16   '$ Temp: 97.9 F (36.6 C) 97.7 F (36.5 C)    TempSrc: Oral Oral    SpO2:  97% 99% 97%   Weight:    74.3 kg  Height:    '5\' 3"'$  (1.6 m)    Intake/Output Summary (Last 24 hours) at 06/08/2022 1303 Last data filed at 06/08/2022 1253 Gross per 24 hour  Intake 829.17 ml  Output 1100 ml  Net -270.83 ml   Filed Weights   06/07/22 1906 06/08/22 1045  Weight: 74.3 kg 74.3 kg    Examination:  Patient was seen and examined this morning before her surgery  Awake Alert, Oriented X 3, No new F.N deficits, Normal affect Symmetrical Chest wall movement, Good air movement bilaterally, CTAB RRR,No Gallops,Rubs or new Murmurs, No Parasternal Heave +ve B.Sounds, Abd Soft, No tenderness, No rebound - guarding or rigidity. No Cyanosis, Clubbing or edema, No new Rash or bruise      Data Reviewed: I have personally reviewed following labs and imaging studies  CBC: Recent Labs  Lab 06/07/22 1149  WBC 4.7  NEUTROABS 2.5  HGB 12.6  HCT 36.5  MCV 91.5  PLT 580    Basic Metabolic Panel: Recent Labs  Lab 06/07/22 1149  NA 136  K 4.0  CL 103  CO2 25  GLUCOSE 124*  BUN 13  CREATININE 0.87  CALCIUM 9.3    GFR: Estimated Creatinine Clearance: 42.4 mL/min (by C-G formula based on SCr of 0.87 mg/dL).  Liver Function Tests: No results for input(s): "AST", "ALT", "ALKPHOS", "BILITOT", "PROT", "  ALBUMIN" in the last 168 hours.  CBG: No results for input(s): "GLUCAP" in the last 168 hours.   Recent Results (from the past 240 hour(s))  Surgical pcr screen     Status: Abnormal   Collection Time: 06/07/22  7:09 PM   Specimen: Nasal Mucosa; Nasal Swab  Result Value Ref Range Status   MRSA, PCR NEGATIVE NEGATIVE Final   Staphylococcus aureus POSITIVE (A) NEGATIVE Final    Comment: (NOTE) The Xpert SA Assay (FDA approved for NASAL specimens in patients 71 years of age and older), is one component of a comprehensive surveillance program. It is not intended to diagnose infection nor to guide or monitor treatment. Performed at [facility], [place] [address] [place], [place] [address]          Radiology Studies: DG Knee Right Port  Result Date: 06/07/2022 CLINICAL DATA:  Mechanical fall on right hip with hip fracture EXAM: PORTABLE RIGHT KNEE - 1-2 VIEW COMPARISON:  Radiographs 06/07/2022 FINDINGS: No acute fracture or dislocation of the right knee. Demineralization. Mild degenerative arthritis. No knee joint effusion. IMPRESSION: No acute fracture or dislocation. Electronically Signed   By: Placido Sou M.D.   On: 06/07/2022 17:29   DG Chest Port 1 View  Result Date: 06/07/2022 CLINICAL DATA:  Preop imaging EXAM: PORTABLE CHEST 1 VIEW COMPARISON:  Chest x-ray dated July 15, 2014 FINDINGS: Cardiac and mediastinal contours unchanged. Large hiatal hernia, increased in size when compared with the prior exam. Mild left basilar opacity, likely due to atelectasis. No large pleural effusion or evidence of pneumothorax. Degenerative changes of the left-greater-than-right shoulders. IMPRESSION: 1.  Mild left basilar opacity, likely due to atelectasis. 2. Large hiatal hernia, increased in size when compared with the prior exam. Electronically Signed   By: Yetta Glassman M.D.   On: 06/07/2022 13:05   DG Hip Unilat With Pelvis 2-3 Views Right  Result Date: 06/07/2022 CLINICAL DATA:  Fall EXAM: DG HIP (WITH OR WITHOUT PELVIS) 2-3V RIGHT COMPARISON:  Hip radiographs 07/27/2021 FINDINGS: There is an acute mildly proximally displaced right intertrochanteric fracture. Femoroacetabular alignment is normal. The SI joints and symphysis pubis are intact. No acute fracture is seen on the left. A calcified uterine fibroid is again noted. IMPRESSION: Acute mildly proximally displaced right intertrochanteric fracture. Electronically Signed   By: Valetta Mole M.D.   On: 06/07/2022 12:32        Scheduled Meds:  chlorhexidine  60 mL Topical Once   feeding supplement  1 Container Oral BID BM   [MAR Hold] melatonin  3 mg Oral QHS   multivitamin  with minerals  1 tablet Oral Daily   povidone-iodine  2 Application Topical Once   Continuous Infusions:  lactated ringers 10 mL/hr at 06/08/22 1202   [MAR Hold] methocarbamol (ROBAXIN) IV       LOS: 1 day       Phillips Climes, MD [facility]   To contact the attending provider between 7A-7P or the covering provider during after hours 7P-7A, please log into the web site [email or web address] and access using universal [place] password for that web site. If you do not have the password, please call the hospital operator.  06/08/2022, 1:03 PM

## 2022-06-08 NOTE — Plan of Care (Signed)
  Problem: Clinical Measurements: Goal: Respiratory complications will improve Outcome: Progressing   Problem: Pain Managment: Goal: General experience of comfort will improve Outcome: Progressing   

## 2022-06-08 NOTE — Anesthesia Procedure Notes (Signed)
Procedure Name: MAC Date/Time: 06/08/2022 12:03 PM  Performed by: Victoriano Lain, CRNAPre-anesthesia Checklist: Patient identified, Emergency Drugs available, Suction available, Patient being monitored and Timeout performed Patient Re-evaluated:Patient Re-evaluated prior to induction Oxygen Delivery Method: Simple face mask Dental Injury: Teeth and Oropharynx as per pre-operative assessment

## 2022-06-08 NOTE — Progress Notes (Signed)
Orthopedic Tech Progress Note Patient Details:  Krystal Moore 07-31-1930 [id number]  Patient ID: Krystal Moore, female   DOB: [birth year], 88 y.o.   MRN: [id number]  [name] 06/08/2022, 10:23 AM [place] traction removed for surgery.

## 2022-06-08 NOTE — Transfer of Care (Signed)
Immediate Anesthesia Transfer of Care Note  Patient: Krystal Moore  Procedure(s) Performed: INTRAMEDULLARY (IM) NAIL INTERTROCHANTERIC (Right)  Patient Location: PACU  Anesthesia Type:MAC and Spinal  Level of Consciousness: awake, alert , oriented and patient cooperative  Airway & Oxygen Therapy: Patient Spontanous Breathing and Patient connected to face mask oxygen  Post-op Assessment: Report given to RN and Post -op Vital signs reviewed and stable  Post vital signs: Reviewed and stable  Last Vitals:  Vitals Value Taken Time  BP 92/48 06/08/22 1342  Temp    Pulse 65 06/08/22 1344  Resp 16 06/08/22 1344  SpO2 98 % 06/08/22 1344  Vitals shown include unvalidated device data.  Last Pain:  Vitals:   06/08/22 1045  TempSrc:   PainSc: 7          Complications: No notable events documented.

## 2022-06-08 NOTE — Interval H&P Note (Signed)
History and Physical Interval Note:  06/08/2022 11:44 AM  Krystal Moore  has presented today for surgery, with the diagnosis of RIGHT HIP FRACTURE.  The various methods of treatment have been discussed with the patient and family. After consideration of risks, benefits and other options for treatment, the patient has consented to  Procedure(s): INTRAMEDULLARY (IM) NAIL INTERTROCHANTERIC (Right) as a surgical intervention.  The patient's history has been reviewed, patient examined, no change in status, stable for surgery.  I have reviewed the patient's chart and labs.  Questions were answered to the patient's satisfaction.     Krystal Moore

## 2022-06-09 ENCOUNTER — Encounter (HOSPITAL_COMMUNITY): Payer: Self-pay | Admitting: Orthopedic Surgery

## 2022-06-09 DIAGNOSIS — S72001A Fracture of unspecified part of neck of right femur, initial encounter for closed fracture: Secondary | ICD-10-CM | POA: Diagnosis not present

## 2022-06-09 LAB — BASIC METABOLIC PANEL
Anion gap: 6 (ref 5–15)
BUN: 13 mg/dL (ref 8–23)
CO2: 24 mmol/L (ref 22–32)
Calcium: 8.8 mg/dL — ABNORMAL LOW (ref 8.9–10.3)
Chloride: 104 mmol/L (ref 98–111)
Creatinine, Ser: 0.87 mg/dL (ref 0.44–1.00)
GFR, Estimated: >60 mL/min (ref >60)
Glucose, Bld: 179 mg/dL — ABNORMAL HIGH (ref 70–99)
Potassium: 3.8 mmol/L (ref 3.5–5.1)
Sodium: 134 mmol/L — ABNORMAL LOW (ref 135–145)

## 2022-06-09 LAB — CBC
HCT: 31.5 % — ABNORMAL LOW (ref 36.0–46.0)
Hemoglobin: 10.9 g/dL — ABNORMAL LOW (ref 12.0–15.0)
MCH: 31.6 pg (ref 26.0–34.0)
MCHC: 34.6 g/dL (ref 30.0–36.0)
MCV: 91.3 fL (ref 80.0–100.0)
Platelets: 162 10*3/uL (ref 150–400)
RBC: 3.45 MIL/uL — ABNORMAL LOW (ref 3.87–5.11)
RDW: 12.6 % (ref 11.5–15.5)
WBC: 5.1 10*3/uL (ref 4.0–10.5)
nRBC: 0 % (ref 0.0–0.2)

## 2022-06-09 MED ORDER — HYDROCODONE-ACETAMINOPHEN 10-325 MG PO TABS: 0.5000 | ORAL_TABLET | ORAL | 0 refills | Status: AC | PRN

## 2022-06-09 MED ORDER — ENOXAPARIN SODIUM 40 MG/0.4ML IJ SOSY
40.0000 mg | PREFILLED_SYRINGE | INTRAMUSCULAR | Status: DC
  Administered 2022-06-10 – 2022-06-14 (×5): 40 mg via SUBCUTANEOUS
  Filled 2022-06-09 (×5): qty 0.4

## 2022-06-09 MED ORDER — ASPIRIN 81 MG PO CHEW: 81.0000 mg | CHEWABLE_TABLET | Freq: Two times a day (BID) | ORAL | 0 refills | Status: DC

## 2022-06-09 NOTE — TOC Initial Note (Signed)
Transition of Care [facility]) - Initial/Assessment Note   Patient Details  Name: Krystal Moore MRN: [id number] Date of Birth: 06-Sep-1930  Transition of [facility] CM/SW Contact:    Sherie Don, LCSW Phone Number: 06/09/2022, 2:38 PM  Clinical Narrative: PT and OT recommended SNF. CSW met with patient to discuss recommendations. Patient is agreeable to being referred out for SNF, but is also willing to discharge home with [facility] if she improves enough to go home before insurance authorization is received. CSW explained there are limited facilities in-network with patient's insurance, so this will affect bed offers.  FL2 done; PASRR received. Initial referral faxed out. TOC awaiting bed offers.  Expected Discharge Plan: Skilled Nursing Facility Barriers to Discharge: SNF Pending bed offer, Insurance Authorization  Patient Goals and CMS Choice Patient states their goals for this hospitalization and ongoing recovery are:: Go to short-term rehab CMS Medicare.gov Compare Post Acute Care list provided to:: Patient Choice offered to / list presented to : Patient  Expected Discharge Plan and Services Expected Discharge Plan: [place] In-house Referral: Clinical Social Work Post Acute Care Choice: [place] Living arrangements for the past 2 months: [place]            DME Arranged: N/A DME Agency: NA  Prior Living Arrangements/Services Living arrangements for the past 2 months: Single Family Home Lives with:: Friends Patient language and need for interpreter reviewed:: Yes Do you feel safe going back to the place where you live?: Yes      Need for Family Participation in Patient Care: No (Comment) Care giver support system in place?: Yes (comment) Criminal Activity/Legal Involvement Pertinent to Current Situation/Hospitalization: No - Comment as needed  Activities of Daily Living Home Assistive Devices/Equipment: Eyeglasses (reading glasses  and glasses) ADL Screening (condition at time of admission) Patient's cognitive ability adequate to safely complete daily activities?: Yes Is the patient deaf or have difficulty hearing?: No Does the patient have difficulty seeing, even when wearing glasses/contacts?: No Does the patient have difficulty concentrating, remembering, or making decisions?: No Patient able to express need for assistance with ADLs?: Yes Does the patient have difficulty dressing or bathing?: Yes Independently performs ADLs?: No Communication: Independent Dressing (OT): Needs assistance Is this a change from baseline?: Change from baseline, expected to last >3 days Grooming: Independent Feeding: Independent Bathing: Needs assistance Is this a change from baseline?: Change from baseline, expected to last >3 days Toileting: Needs assistance Is this a change from baseline?: Change from baseline, expected to last >3days In/Out Bed: Needs assistance Is this a change from baseline?: Change from baseline, expected to last >3 days Walks in Home: Dependent Is this a change from baseline?: Change from baseline, expected to last >3 days Does the patient have difficulty walking or climbing stairs?: Yes Weakness of Legs: Right Weakness of Arms/Hands: None  Permission Sought/Granted Permission sought to share information with : Facility Art therapist granted to share information with : Yes, Verbal Permission Granted Permission granted to share info w AGENCY: SNFs  Emotional Assessment Appearance:: Appears stated age Attitude/Demeanor/Rapport: Engaged Affect (typically observed): Accepting Orientation: : Oriented to Self, Oriented to Place, Oriented to  Time, Oriented to Situation Alcohol / Substance Use: Not Applicable Psych Involvement: No (comment)  Admission diagnosis:  Closed right hip fracture ([place]) [S72.001A] Closed fracture of right hip, initial encounter [facility]) [S72.001A] Patient Active  Problem List   Diagnosis Date Noted   Closed right hip fracture ([place]) 06/07/2022   Fall  at home, initial encounter 06/07/2022   Numbness 07/30/2019   Spinal stenosis of lumbar region 07/30/2019   Osteopenia after menopause 05/29/2019   Over weight 05/29/2019   Elevated blood pressure reading 05/29/2019   Immunization due 08/03/2017   Left leg swelling 05/04/2017   Allergic rhinitis 10/11/2015   Hypothyroidism 12/10/2013   Peripheral neuropathy 05/16/2013   PCP:  Ladell Pier, MD Pharmacy:   [facility] [place], [place] - [address] AT [address] [place] [address] Phone: [phone] Fax: [phone]  [facility] [place], [place] AT [address] [place] [address] Phone: [phone] Fax: [phone]  [facility] [phone] - [place], [place] - [address] [address] Phone: [phone] Fax: [place], [address] [address] Phone: [phone] Fax: [phone]  Readmission Risk Interventions     No data to display

## 2022-06-09 NOTE — Anesthesia Postprocedure Evaluation (Signed)
Anesthesia Post Note  Patient: Krystal Moore  Procedure(s) Performed: INTRAMEDULLARY (IM) NAIL INTERTROCHANTERIC (Right)     Patient location during evaluation: PACU Anesthesia Type: Spinal and MAC Level of consciousness: patient cooperative Pain management: pain level controlled Vital Signs Assessment: post-procedure vital signs reviewed and stable Respiratory status: spontaneous breathing, nonlabored ventilation, respiratory function stable and patient connected to nasal cannula oxygen Cardiovascular status: stable and blood pressure returned to baseline Postop Assessment: no apparent nausea or vomiting Anesthetic complications: no   No notable events documented.  Last Vitals:  Vitals:   06/09/22 0154 06/09/22 0616  BP: (!) 121/53 115/87  Pulse: 61 66  Resp: 18 18  Temp: 36.4 C 36.8 C  SpO2: 96% 97%    Last Pain:  Vitals:   06/09/22 0801  TempSrc:   PainSc: 7                  Juaquin [place]

## 2022-06-09 NOTE — Evaluation (Signed)
Occupational Therapy Evaluation Patient Details Name: Krystal Moore MRN: [id number] DOB: 1930/09/10 Today's Date: 06/09/2022   History of Present Illness Krystal Moore is a 88 y.o. female who presented with an intertrochanteric femur fracture after a fall. Pt underwent Intramedullary fixation, Right femur on 06/07/22.  Pt with hx of Narcolepsy, polyneuropathy, and L shoulder fx   Clinical Impression   Patient is currently requiring assistance with ADLs including up to Total As assist with Lower body ADLs, Minimal assist with Upper body ADLs,  as well as Total assist of 2 people with bed mobility and Moderate assist of 2 people with functional transfers to toilet.  Current level of function is below patient's typical baseline.  During this evaluation, patient was limited by post-op pain, generalized weakness, impaired activity tolerance, and cognitive impairments, all of which has the potential to impact patient's safety and independence during functional mobility, as well as performance for ADLs.  Patient lives with a roommate, who is able to provide near 24/7 supervision and assistance, but travels for 3 days each month.  Patient demonstrates fair to good rehab potential, and should benefit from continued skilled occupational therapy services while in acute care to maximize safety, independence and quality of life at home.  Continued occupational therapy services in a SNF setting prior to return home is recommended.  ?    Recommendations for follow up therapy are one component of a multi-disciplinary discharge planning process, led by the attending physician.  Recommendations may be updated based on patient status, additional functional criteria and insurance authorization.   Follow Up Recommendations  Skilled nursing-short term rehab (<3 hours/day)    Assistance Recommended at Discharge Frequent or constant Supervision/Assistance  Patient can return home with the following  Two people to help with walking and/or transfers;A lot of help with bathing/dressing/bathroom;Direct supervision/assist for financial management;Assist for transportation;Help with stairs or ramp for entrance;Assistance with cooking/housework    Functional Status Assessment  Patient has had a recent decline in their functional status and demonstrates the ability to make significant improvements in function in a reasonable and predictable amount of time.  Equipment Recommendations  BSC/3in1 (Will also defer to post acute recommendations)    Recommendations for Other Services       Precautions / Restrictions Precautions Precautions: Fall Restrictions Weight Bearing Restrictions: No RLE Weight Bearing: Weight bearing as tolerated      Mobility Bed Mobility Overal bed mobility: Needs Assistance Bed Mobility: Supine to Sit     Supine to sit: Max assist, +2 for physical assistance, HOB elevated, Total assist     General bed mobility comments: Attempted to have pt move to EOB but pt with frequent spasms with pt freezing up. Very stiff through trunk and LEs, therefore required Max to Total Assist of 2 people with trunk and LE support to transition to EOB.    Transfers                          Balance Overall balance assessment: Needs assistance Sitting-balance support: Bilateral upper extremity supported, Feet supported Sitting balance-Leahy Scale: Fair     Standing balance support: Bilateral upper extremity supported, During functional activity, Reliant on assistive device for balance Standing balance-Leahy Scale: Poor                             ADL either performed or assessed with clinical judgement   ADL Overall ADL's :  Needs assistance/impaired Eating/Feeding: Supervision/ safety;Sitting   Grooming: Wash/dry hands;Sitting;Set up;Supervision/safety   Upper Body Bathing: Sitting;Min guard;Cueing for sequencing   Lower Body Bathing: Maximal  assistance;+2 for physical assistance;Bed level   Upper Body Dressing : Set up;Supervision/safety;Sitting   Lower Body Dressing: Total assistance;Sitting/lateral leans Lower Body Dressing Details (indicate cue type and reason): To don socks. Toilet Transfer: Cueing for sequencing;Cueing for safety;Rolling walker (2 wheels);+2 for physical assistance;Moderate assistance Toilet Transfer Details (indicate cue type and reason): Pt stood from EOB to RW with Min As of 2 people, cues for hands and RLE. Pt took ~5 steps with RW, slow with increased effort and Mod As with chair follow. Pt lowered to chair with Minimal assist of 2 people and multimodal cues for hand placement and sequencing. Toileting- Clothing Manipulation and Hygiene: Maximal assistance;Sitting/lateral lean;Sit to/from stand;+2 for physical assistance       Functional mobility during ADLs: Maximal assistance;+2 for physical assistance;Minimal assistance;Cueing for sequencing;Cueing for safety;Rolling walker (2 wheels)       Vision   Vision Assessment?: No apparent visual deficits     Perception     Praxis      Pertinent Vitals/Pain Pain Assessment Pain Assessment: Faces Faces Pain Scale: Hurts whole lot Pain Location: Pt repeatedly denying pain, then seemed to seize up with spasms. Pain Descriptors / Indicators: Spasm Pain Intervention(s): Limited activity within patient's tolerance, Relaxation, Monitored during session, Premedicated before session, Repositioned (Cues to relax muscles ad lib and breath)     Hand Dominance Right   Extremity/Trunk Assessment Upper Extremity Assessment Upper Extremity Assessment: Generalized weakness   Lower Extremity Assessment Lower Extremity Assessment: RLE deficits/detail RLE: Unable to fully assess due to pain       Communication Communication Communication: No difficulties   Cognition Arousal/Alertness: Awake/alert Behavior During Therapy: Impulsive, Anxious Overall  Cognitive Status: Impaired/Different from baseline Area of Impairment: Attention, Memory, Following commands, Safety/judgement, Awareness, Problem solving                   Current Attention Level: Selective Memory: Decreased short-term memory Following Commands: Follows one step commands consistently, Follows one step commands with increased time, Follows multi-step commands inconsistently Safety/Judgement: Decreased awareness of deficits, Decreased awareness of safety Awareness: Emergent Problem Solving: Slow processing, Requires verbal cues, Requires tactile cues       General Comments       Exercises     Shoulder Instructions      Home Living Family/patient expects to be discharged to:: Private residence Living Arrangements: Non-relatives/Friends Available Help at Discharge: Friend(s);Other (Comment) Type of Home: House Home Access: Stairs to enter Entrance Stairs-Number of Steps: 6 Entrance Stairs-Rails: Right Home Layout: One level     Bathroom Shower/Tub: Occupational psychologist: Handicapped height     Home Equipment: Shower seat;Grab bars - tub/shower;Hand held Engineering geologist (2 wheels);Standard Walker          Prior Functioning/Environment Prior Level of Function : Driving;Working/employed;History of Falls (last six months)             Mobility Comments: Had had other fall about a year ago and broke wrist. Roommate reminds pt of other fall and hitting head. 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        OT Problem List: Decreased strength;Pain;Decreased cognition;Decreased activity tolerance;Decreased safety awareness;Impaired balance (sitting and/or standing);Decreased knowledge of use of DME or AE;Decreased knowledge of precautions      OT Treatment/Interventions: Self-care/ADL training;Therapeutic activities;Cognitive remediation/compensation;DME  and/or AE instruction;Patient/family education;Balance  training    OT Goals(Current goals can be found in the care plan section) Acute Rehab OT Goals Patient Stated Goal: To return to previous level of function. OT Goal Formulation: With patient Time For Goal Achievement: 06/23/22 Potential to Achieve Goals: Fair ADL Goals Pt Will Perform Lower Body Bathing: with supervision;sitting/lateral leans;with adaptive equipment Pt Will Perform Lower Body Dressing: with set-up;with supervision;with adaptive equipment;sitting/lateral leans;sit to/from stand Pt Will Transfer to Toilet: with min guard assist;ambulating Pt Will Perform Toileting - Clothing Manipulation and hygiene: with min guard assist;sitting/lateral leans;sit to/from stand Additional ADL Goal #1: Pt will engage in 15 min functional activities without loss of sitting or standing balance with no more than Min guard assistance, in order to demonstrate improved activity tolerance and balance needed to perform ADLs safely at home.  OT Frequency: Min 2X/week    Co-evaluation PT/OT/SLP Co-Evaluation/Treatment: Yes Reason for Co-Treatment: Complexity of the patient's impairments (multi-system involvement);For patient/therapist safety PT goals addressed during session: Mobility/safety with mobility OT goals addressed during session: ADL's and self-care      AM-PAC OT "6 Clicks" Daily Activity     Outcome Measure Help from another person eating meals?: A Little Help from another person taking care of personal grooming?: A Little Help from another person toileting, which includes using toliet, bedpan, or urinal?: A Lot Help from another person bathing (including washing, rinsing, drying)?: A Lot Help from another person to put on and taking off regular upper body clothing?: A Little Help from another person to put on and taking off regular lower body clothing?: Total 6 Click Score: 14   End of Session Equipment Utilized During Treatment: Gait belt;Rolling walker (2 wheels) Nurse  Communication: Mobility status  Activity Tolerance: Patient limited by pain Patient left: in chair;with call bell/phone within reach;with chair alarm set;with family/visitor present  OT Visit Diagnosis: Unsteadiness on feet (R26.81);Other symptoms and signs involving cognitive function;Pain Pain - Right/Left: Right Pain - part of body: Leg                Time: 4628-6381 OT Time Calculation (min): 34 min Charges:  OT General Charges $OT Visit: 1 Visit OT Evaluation $OT Eval Moderate Complexity: 1 Mod  Tom Macpherson, OT Acute Rehab Services Office: [phone] 06/09/2022  Julien Girt 06/09/2022, 1:12 PM

## 2022-06-09 NOTE — Progress Notes (Signed)
    Subjective:  Patient reports pain as moderate.  Denies N/V/CP/SOB/Abd pain. She states that she is doing okay. She reports having some muscle spasms this morning. She was asking for some medication for this. Discussed with her it is ordered and she just needs to let her nurse know. She is very tense this morning. We discussed trying to relax her leg and continue to move her foot around.   Objective:   VITALS:   Vitals:   06/08/22 1718 06/08/22 2251 06/09/22 0154 06/09/22 0616  BP: (!) 152/73 (!) 122/46 (!) 121/53 115/87  Pulse: 62 76 61 66  Resp: _0 Temp: 98.2 F (36.8 C) 97.7 F (36.5 C) 97.6 F (36.4 C) 98.2 F (36.8 C)  TempSrc: Oral     SpO2: 97% 93% 96% 97%  Weight:      Height:        Patient lying in bed. NAD.  Neurologically intact ABD soft Neurovascular intact Sensation intact distally Intact pulses distally Dorsiflexion/Plantar flexion intact No cellulitis present Compartment soft Mepilex dressing x2 C/D/I.   Lab Results  Component Value Date   WBC 5.1 06/09/2022   HGB 10.9 (L) 06/09/2022   HCT 31.5 (L) 06/09/2022   MCV 91.3 06/09/2022   PLT 162 06/09/2022   BMET    Component Value Date/Time   NA 134 (L) 06/09/2022 0339   NA 134 12/28/2021 1119   K 3.8 06/09/2022 0339   CL 104 06/09/2022 0339   CO2 24 06/09/2022 0339   GLUCOSE 179 (H) 06/09/2022 0339   BUN 13 06/09/2022 0339   BUN 9 12/28/2021 1119   CREATININE 0.87 06/09/2022 0339   CALCIUM 8.8 (L) 06/09/2022 0339   EGFR 70 12/28/2021 1119   GFRNONAA >60 06/09/2022 0339     Assessment/Plan: 1 Day Post-Op   Principal Problem:   Closed right hip fracture (HCC) Active Problems:   Hypothyroidism   Fall at home, initial encounter   WBAT with walker DVT ppx: Aspirin, SCDs, TEDS PO pain control PT/OT: PT has not seen yet. PT to come by today. Dispo: Patient under care of the medial team, disposition per their recommendation. Pain medication and DVT ppx printed in chart.     Charlott Rakes, PA-C 06/09/2022, 7:43 AM  [facility]  [place] [address], [address], [place], [place] [address]

## 2022-06-09 NOTE — NC FL2 (Signed)
[place] LEVEL OF CARE SCREENING TOOL     IDENTIFICATION  Patient Name: Krystal Moore Birthdate: 1930-04-22 Sex: female Admission Date (Current Location): 06/07/2022  [place] and [place] Number:  Krystal Moore [id number] [place] and Address:  [facility],  [place], [place]      Provider Number: [id number]  Attending Physician Name and Address:  Darliss Cheney, MD  Relative Name and Phone Number:  Pam Drown (friend) Ph: [phone]    Current Level of Care: Hospital Recommended Level of Care: [place] Prior Approval Number:    Date Approved/Denied:   PASRR Number: [id number]  Discharge Plan: SNF    Current Diagnoses: Patient Active Problem List   Diagnosis Date Noted   Closed right hip fracture ([place]) 06/07/2022   Fall at home, initial encounter 06/07/2022   Numbness 07/30/2019   Spinal stenosis of lumbar region 07/30/2019   Osteopenia after menopause 05/29/2019   Over weight 05/29/2019   Elevated blood pressure reading 05/29/2019   Immunization due 08/03/2017   Left leg swelling 05/04/2017   Allergic rhinitis 10/11/2015   Hypothyroidism 12/10/2013   Peripheral neuropathy 05/16/2013    Orientation RESPIRATION BLADDER Height & Weight     Self, Time, Situation, Place  Normal Continent Weight: 163 lb 12.8 oz (74.3 kg) Height:  '5\' 3"'$  (160 cm)  BEHAVIORAL SYMPTOMS/MOOD NEUROLOGICAL BOWEL NUTRITION STATUS   (N/A)  (N/A) Continent Diet (Regular diet)  AMBULATORY STATUS COMMUNICATION OF NEEDS Skin   Extensive Assist Verbally Surgical wounds                       Personal Care Assistance Level of Assistance  Bathing, Feeding, Dressing Bathing Assistance: Maximum assistance Feeding assistance: Independent Dressing Assistance: Maximum assistance     Functional Limitations Info  Sight, Hearing, Speech Sight Info: Adequate Hearing Info: Adequate Speech Info: Adequate    SPECIAL CARE  FACTORS FREQUENCY  PT (By licensed PT), OT (By licensed OT)     PT Frequency: 5x's/week OT Frequency: 5x's/week            Contractures Contractures Info: Not present    Additional Factors Info  Code Status, Allergies Code Status Info: Full Allergies Info: Elemental Sulfur, Milk-related Compounds, Poison Ivy Extract (Poison Ivy Extract), Tobacco (Tobacco), Cymbalta (Duloxetine Hcl), Orange Juice (Orange Oil)           Current Medications (06/09/2022):  This is the current hospital active medication list Current Facility-Administered Medications  Medication Dose Route Frequency Provider Last Rate Last Admin   aspirin EC tablet 325 mg  325 mg Oral Q breakfast Rod Can, MD   325 mg at 06/09/22 0848   Chlorhexidine Gluconate Cloth 2 % PADS 6 each  6 each Topical Q0600 Elgergawy, Silver Huguenin, MD   6 each at 06/09/22 0620   docusate sodium (COLACE) capsule 100 mg  100 mg Oral BID Rod Can, MD   100 mg at 06/09/22 0848   [START ON 06/10/2022] enoxaparin (LOVENOX) injection 40 mg  40 mg Subcutaneous Q24H Pahwani, Einar Grad, MD       feeding supplement (BOOST / RESOURCE BREEZE) liquid 1 Container  1 Container Oral BID BM Rod Can, MD   1 Container at 06/08/22 2000   HYDROcodone-acetaminophen (NORCO/VICODIN) 5-325 MG per tablet 1-2 tablet  1-2 tablet Oral Q6H PRN Rod Can, MD   2 tablet at 06/09/22 1356   menthol-cetylpyridinium (CEPACOL) lozenge 3 mg  1 lozenge Oral PRN Rod Can, MD  Or   phenol (CHLORASEPTIC) mouth spray 1 spray  1 spray Mouth/Throat PRN Swinteck, Aaron Edelman, MD       methocarbamol (ROBAXIN) tablet 500 mg  500 mg Oral Q6H PRN Rod Can, MD   500 mg at 06/09/22 0801   Or   methocarbamol (ROBAXIN) 500 mg in dextrose 5 % 50 mL IVPB  500 mg Intravenous Q6H PRN Swinteck, Aaron Edelman, MD       metoCLOPramide (REGLAN) tablet 5-10 mg  5-10 mg Oral Q8H PRN Swinteck, Aaron Edelman, MD       Or   metoCLOPramide (REGLAN) injection 5-10 mg  5-10 mg Intravenous Q8H  PRN Swinteck, Aaron Edelman, MD       morphine (PF) 2 MG/ML injection 0.5 mg  0.5 mg Intravenous Q2H PRN Swinteck, Aaron Edelman, MD   0.5 mg at 06/08/22 1017   mupirocin ointment (BACTROBAN) 2 % 1 Application  1 Application Nasal BID Elgergawy, Silver Huguenin, MD   1 Application at 14/43/15 0848   ondansetron (ZOFRAN) tablet 4 mg  4 mg Oral Q6H PRN Swinteck, Aaron Edelman, MD       Or   ondansetron (ZOFRAN) injection 4 mg  4 mg Intravenous Q6H PRN Swinteck, Aaron Edelman, MD       prochlorperazine (COMPAZINE) injection 10 mg  10 mg Intravenous Q6H PRN Swinteck, Aaron Edelman, MD       senna (SENOKOT) tablet 8.6 mg  1 tablet Oral BID Rod Can, MD   8.6 mg at 06/09/22 0848   sodium chloride (OCEAN) 0.65 % nasal spray 1 spray  1 spray Each Nare PRN Rod Can, MD         Discharge Medications: Please see discharge summary for a list of discharge medications.  Relevant Imaging Results:  Relevant Lab Results:   Additional Information SSN: [id number]  Sherie Don, LCSW

## 2022-06-09 NOTE — Evaluation (Signed)
Physical Therapy Evaluation Patient Details Name: Krystal Moore MRN: [id number] DOB: 1930-07-25 Today's Date: 06/09/2022  History of Present Illness  Krystal Moore is a 88 y.o. female who presented with an intertrochanteric femur fracture after a fall. Pt underwent Intramedullary fixation, Right femur on 06/07/22.  Pt with hx of Narcolepsy, polyneuropathy, and L shoulder fx  Clinical Impression  Pt admitted as above and presenting with functional mobility limitations 2* decreased L LE strength/ROM, balance deficits and significant post op pain 2* muscle spasms.  Pt is currently requiring significant assist of 2 for performance of all basic mobility tasks and would benefit from follow up rehab at SNF level to maximize IND and safety prior to return home with limited assist.     Recommendations for follow up therapy are one component of a multi-disciplinary discharge planning process, led by the attending physician.  Recommendations may be updated based on patient status, additional functional criteria and insurance authorization.  Follow Up Recommendations Skilled nursing-short term rehab (<3 hours/day) Can patient physically be transported by private vehicle: No    Assistance Recommended at Discharge Frequent or constant Supervision/Assistance  Patient can return home with the following  Two people to help with walking and/or transfers;A lot of help with bathing/dressing/bathroom;Assistance with cooking/housework;Assist for transportation;Help with stairs or ramp for entrance    Equipment Recommendations None recommended by PT  Recommendations for Other Services       Functional Status Assessment Patient has had a recent decline in their functional status and demonstrates the ability to make significant improvements in function in a reasonable and predictable amount of time.     Precautions / Restrictions Precautions Precautions: Fall Restrictions Weight Bearing  Restrictions: No RLE Weight Bearing: Weight bearing as tolerated      Mobility  Bed Mobility Overal bed mobility: Needs Assistance Bed Mobility: Supine to Sit     Supine to sit: Max assist, +2 for physical assistance, +2 for safety/equipment     General bed mobility comments: Increased time and assist with extensive use of bed pad 2* muscle spasms/pain    Transfers Overall transfer level: Needs assistance Equipment used: Rolling walker (2 wheels) Transfers: Sit to/from Stand Sit to Stand: Mod assist, +2 physical assistance, +2 safety/equipment           General transfer comment: cues for LE management and use of UEs to self assist.  Physical assist to bring wt up and fwd and to balance in standing with RW    Ambulation/Gait Ambulation/Gait assistance: Mod assist, +2 safety/equipment Gait Distance (Feet): 6 Feet Assistive device: Rolling walker (2 wheels) Gait Pattern/deviations: Step-to pattern, Decreased step length - right, Decreased step length - left, Shuffle, Trunk flexed Gait velocity: decr     General Gait Details: Increased time with cues for sequence, posture and position from [employer]            Wheelchair Mobility    Modified Rankin (Stroke Patients Only)       Balance Overall balance assessment: Needs assistance Sitting-balance support: Feet supported, Single extremity supported Sitting balance-Leahy Scale: Fair     Standing balance support: Bilateral upper extremity supported Standing balance-Leahy Scale: Poor                               Pertinent Vitals/Pain Pain Assessment Pain Assessment: No/denies pain Faces Pain Scale: Hurts whole lot Pain Location: Pt repeatedly denying pain, then seemed to seize up  with spasms. Pain Descriptors / Indicators: Spasm Pain Intervention(s): Limited activity within patient's tolerance    Home Living Family/patient expects to be discharged to:: Private residence Living  Arrangements: Non-relatives/Friends Available Help at Discharge: Friend(s);Other (Comment) Type of Home: House Home Access: Stairs to enter Entrance Stairs-Rails: Right Entrance Stairs-Number of Steps: 6   Home Layout: One level Home Equipment: Shower seat;Grab bars - tub/shower;Hand held Engineering geologist (2 wheels);Standard Walker      Prior Function Prior Level of Function : Driving;Working/employed;History of Falls (last six months)             Mobility Comments: Had had other fall about a year ago and broke wrist. Roommate reminds pt of other fall and hitting head. 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     Hand Dominance   Dominant Hand: Right    Extremity/Trunk Assessment   Upper Extremity Assessment Upper Extremity Assessment: Generalized weakness    Lower Extremity Assessment Lower Extremity Assessment: RLE deficits/detail RLE: Unable to fully assess due to pain       Communication   Communication: No difficulties  Cognition Arousal/Alertness: Awake/alert Behavior During Therapy: Impulsive, Anxious Overall Cognitive Status: Impaired/Different from baseline Area of Impairment: Attention, Memory, Following commands, Safety/judgement, Awareness, Problem solving                   Current Attention Level: Selective Memory: Decreased short-term memory Following Commands: Follows one step commands consistently, Follows one step commands with increased time, Follows multi-step commands inconsistently Safety/Judgement: Decreased awareness of deficits, Decreased awareness of safety Awareness: Emergent Problem Solving: Slow processing, Requires verbal cues, Requires tactile cues          General Comments      Exercises     Assessment/Plan    PT Assessment Patient needs continued PT services  PT Problem List Decreased strength;Decreased range of motion;Decreased activity tolerance;Decreased balance;Decreased  mobility;Decreased knowledge of use of DME;Decreased cognition;Pain       PT Treatment Interventions DME instruction;Gait training;Therapeutic activities;Functional mobility training;Stair training;Therapeutic exercise;Patient/family education    PT Goals (Current goals can be found in the Care Plan section)  Acute Rehab PT Goals Patient Stated Goal: Regain IND PT Goal Formulation: With patient Time For Goal Achievement: 06/29/22 Potential to Achieve Goals: Fair    Frequency Min 3X/week     Co-evaluation PT/OT/SLP Co-Evaluation/Treatment: Yes Reason for Co-Treatment: Complexity of the patient's impairments (multi-system involvement);For patient/therapist safety PT goals addressed during session: Mobility/safety with mobility OT goals addressed during session: ADL's and self-care       AM-PAC PT "6 Clicks" Mobility  Outcome Measure Help needed turning from your back to your side while in a flat bed without using bedrails?: A Lot Help needed moving from lying on your back to sitting on the side of a flat bed without using bedrails?: A Lot Help needed moving to and from a bed to a chair (including a wheelchair)?: A Lot Help needed standing up from a chair using your arms (e.g., wheelchair or bedside chair)?: A Lot Help needed to walk in hospital room?: Total Help needed climbing 3-5 steps with a railing? : Total 6 Click Score: 10    End of Session Equipment Utilized During Treatment: Gait belt Activity Tolerance: Patient limited by pain Patient left: in chair;with call bell/phone within reach;with chair alarm set;with family/visitor present Nurse Communication: Mobility status PT Visit Diagnosis: Unsteadiness on feet (R26.81);History of falling (Z91.81);Difficulty in walking, not elsewhere classified (R26.2);Pain Pain - Right/Left:  Right    Time: 1040-1110 PT Time Calculation (min) (ACUTE ONLY): 30 min   Charges:   PT Evaluation $PT Eval Low Complexity: 1 Low           [place] Pager [phone] Office [phone]   Zachari Alberta 06/09/2022, 1:10 PM

## 2022-06-09 NOTE — Anesthesia Postprocedure Evaluation (Signed)
Anesthesia Post Note  Patient: Genelda Roark  Procedure(s) Performed: INTRAMEDULLARY (IM) NAIL INTERTROCHANTERIC (Right)     Patient location during evaluation: PACU Anesthesia Type: Spinal and MAC Level of consciousness: patient cooperative Pain management: pain level controlled Vital Signs Assessment: post-procedure vital signs reviewed and stable Respiratory status: spontaneous breathing, nonlabored ventilation, respiratory function stable and patient connected to nasal cannula oxygen Cardiovascular status: stable and blood pressure returned to baseline Postop Assessment: no apparent nausea or vomiting Anesthetic complications: no   No notable events documented.  Last Vitals:  Vitals:   06/09/22 0154 06/09/22 0616  BP: (!) 121/53 115/87  Pulse: 61 66  Resp: 18 18  Temp: 36.4 C 36.8 C  SpO2: 96% 97%    Last Pain:  Vitals:   06/09/22 0801  TempSrc:   PainSc: 7                  Karinda Cabriales

## 2022-06-09 NOTE — Progress Notes (Signed)
PROGRESS NOTE    Krystal Moore  KNL:[id number] DOB: [birth year] DOA: 06/07/2022 PCP: Ladell Pier, MD   Brief Narrative:   Krystal Moore is a 88 y.o. female with medical history significant of hypothyroidism. Presenting with right hip pain after fall. She reports that she was in her normal state of health prior to her fall today. She was walking outside and ran into a tree branch. It grazed the top of her head and caused her to lose her balance. She felt to her right side and was unable to get up on her own. She did not hit her head against the ground. She did not suffer LOC. She didn't have CP, palpitations, lightheadedness or dizziness prior to her fall. Her neighbors saw the incident as it happened. They called for EMS.  Her work-up is significant for right hip fracture, she is admitted for further management  Assessment & Plan:   Principal Problem:   Closed right hip fracture [facility]) Active Problems:   Hypothyroidism   Fall at home, initial encounter  Fall/right hip fracture: Status postsurgical repair by general surgery.  Patient is doing well, pain fairly controlled.  To be seen by PT OT and placed possibly at SNF.  Acute blood loss anemia: Hemoglobin dropped from 12.6-10.9 today but no indication of transfusion.  Likely blood loss from surgery.  Monitor closely.  Acquired hypothyroidism: Continue Synthroid.  DVT prophylaxis: enoxaparin (LOVENOX) injection 30 mg Start: 06/09/22 1000 SCDs Start: 06/08/22 1511   Code Status: Full Code  Family Communication:  None present at bedside.  Plan of care discussed with patient in length and he/she verbalized understanding and agreed with it.  Status is: Inpatient Remains inpatient appropriate because: Needs to be assessed by PT OT and then likely placed at SNF.   Estimated body mass index is 29.02 kg/m as calculated from the following:   Height as of this encounter: '5\' 3"'$  (1.6 m).   Weight as of this  encounter: 74.3 kg.    Nutritional Assessment: Body mass index is 29.02 kg/m.Marland Kitchen Seen by dietician.  I agree with the assessment and plan as outlined below: Nutrition Status: Nutrition Problem: Increased nutrient needs Etiology: hip fracture, post-op healing Signs/Symptoms: estimated needs Interventions: Boost Breeze  . Skin Assessment: I have examined the patient's skin and I agree with the wound assessment as performed by the wound care RN as outlined below:    Consultants:  Orthopedics  Procedures:  As above  Antimicrobials:  Anti-infectives (From admission, onward)    Start     Dose/Rate Route Frequency Ordered Stop   06/08/22 1830  ceFAZolin (ANCEF) IVPB 2g/100 mL premix        2 g 200 mL/hr over 30 Minutes Intravenous Every 6 hours 06/08/22 1511 06/09/22 0032   06/08/22 0600  ceFAZolin (ANCEF) IVPB 2g/100 mL premix        2 g 200 mL/hr over 30 Minutes Intravenous On call to O.R. 06/07/22 1647 06/08/22 1250         Subjective: Patient seen and examined.  Minimal right hip pain.  No other complaint.  Objective: Vitals:   06/08/22 2251 06/09/22 0154 06/09/22 0616 06/09/22 1034  BP: (!) 122/46 (!) 121/53 115/87 112/72  Pulse: 76 61 66 63  Resp: '18 18 18 18  '$ Temp: 97.7 F (36.5 C) 97.6 F (36.4 C) 98.2 F (36.8 C) 98.4 F (36.9 C)  TempSrc:      SpO2: 93% 96% 97% 96%  Weight:  Height:        Intake/Output Summary (Last 24 hours) at 06/09/2022 1111 Last data filed at 06/09/2022 0616 Gross per 24 hour  Intake 1915.18 ml  Output 2100 ml  Net -184.82 ml   Filed Weights   06/07/22 1906 06/08/22 1045  Weight: 74.3 kg 74.3 kg    Examination:  General exam: Appears calm and comfortable  Respiratory system: Clear to auscultation. Respiratory effort normal. Cardiovascular system: S1 & S2 heard, RRR. No JVD, murmurs, rubs, gallops or clicks. No pedal edema. Gastrointestinal system: Abdomen is nondistended, soft and nontender. No organomegaly or masses  felt. Normal bowel sounds heard. Central nervous system: Alert and oriented. No focal neurological deficits. Skin: No rashes, lesions or ulcers Psychiatry: Judgement and insight appear normal. Mood & affect appropriate.    Data Reviewed: I have personally reviewed following labs and imaging studies  CBC: Recent Labs  Lab 06/07/22 1149 06/09/22 0339  WBC 4.7 5.1  NEUTROABS 2.5  --   HGB 12.6 10.9*  HCT 36.5 31.5*  MCV 91.5 91.3  PLT 209 893   Basic Metabolic Panel: Recent Labs  Lab 06/07/22 1149 06/09/22 0339  NA 136 134*  K 4.0 3.8  CL 103 104  CO2 25 24  GLUCOSE 124* 179*  BUN 13 13  CREATININE 0.87 0.87  CALCIUM 9.3 8.8*   GFR: Estimated Creatinine Clearance: 42.4 mL/min (by C-G formula based on SCr of 0.87 mg/dL). Liver Function Tests: No results for input(s): "AST", "ALT", "ALKPHOS", "BILITOT", "PROT", "ALBUMIN" in the last 168 hours. No results for input(s): "LIPASE", "AMYLASE" in the last 168 hours. No results for input(s): "AMMONIA" in the last 168 hours. Coagulation Profile: Recent Labs  Lab 06/07/22 1149  INR 1.0   Cardiac Enzymes: No results for input(s): "CKTOTAL", "CKMB", "CKMBINDEX", "TROPONINI" in the last 168 hours. BNP (last 3 results) No results for input(s): "PROBNP" in the last 8760 hours. HbA1C: No results for input(s): "HGBA1C" in the last 72 hours. CBG: No results for input(s): "GLUCAP" in the last 168 hours. Lipid Profile: No results for input(s): "CHOL", "HDL", "LDLCALC", "TRIG", "CHOLHDL", "LDLDIRECT" in the last 72 hours. Thyroid Function Tests: No results for input(s): "TSH", "T4TOTAL", "FREET4", "T3FREE", "THYROIDAB" in the last 72 hours. Anemia Panel: No results for input(s): "VITAMINB12", "FOLATE", "FERRITIN", "TIBC", "IRON", "RETICCTPCT" in the last 72 hours. Sepsis Labs: No results for input(s): "PROCALCITON", "LATICACIDVEN" in the last 168 hours.  Recent Results (from the past 240 hour(s))  Surgical pcr screen      Status: Abnormal   Collection Time: 06/07/22  7:09 PM   Specimen: Nasal Mucosa; Nasal Swab  Result Value Ref Range Status   MRSA, PCR NEGATIVE NEGATIVE Final   Staphylococcus aureus POSITIVE (A) NEGATIVE Final    Comment: (NOTE) The Xpert SA Assay (FDA approved for NASAL specimens in patients 76 years of age and older), is one component of a comprehensive surveillance program. It is not intended to diagnose infection nor to guide or monitor treatment. Performed at [facility], [place] [address], [place], [place] [address]      Radiology Studies: Pelvis Portable  Result Date: 06/08/2022 CLINICAL DATA:  Post RIGHT femoral nailing EXAM: PORTABLE PELVIS 1-2 VIEWS COMPARISON:  Portable exam 1419 hours without recent priors for comparison FINDINGS: IM nail with compression screw and distal locking screw identified in RIGHT femur post ORIF. Question underlying nondisplaced intertrochanteric fracture RIGHT femur. Bones appear demineralized. Hip and SI joint spaces preserved. No fracture or dislocation. IMPRESSION: Post ORIF  of proximal RIGHT femur. Electronically Signed   By: Lavonia Dana M.D.   On: 06/08/2022 14:39   DG C-Arm 1-60 Min-No Report  Result Date: 06/08/2022 CLINICAL DATA:  Surgical internal fixation of right femur fracture. EXAM: DG HIP (WITH OR WITHOUT PELVIS) 1V RIGHT; DG C-ARM 1-60 MIN-NO REPORT Radiation exposure index: 6.7608 mGy. COMPARISON:  June 07, 2022. FINDINGS: Two intraoperative fluoroscopic images were obtained of the right hip. These demonstrate the patient to be status post surgical internal fixation of proximal right femoral fracture. IMPRESSION: Fluoroscopic guidance provided during surgical internal fixation of proximal right femoral fracture. Electronically Signed   By: Marijo Conception M.D.   On: 06/08/2022 13:24   DG HIP UNILAT WITH PELVIS 1V RIGHT  Result Date: 06/08/2022 CLINICAL DATA:  Surgical internal fixation of right femur fracture. EXAM:  DG HIP (WITH OR WITHOUT PELVIS) 1V RIGHT; DG C-ARM 1-60 MIN-NO REPORT Radiation exposure index: 6.7608 mGy. COMPARISON:  June 07, 2022. FINDINGS: Two intraoperative fluoroscopic images were obtained of the right hip. These demonstrate the patient to be status post surgical internal fixation of proximal right femoral fracture. IMPRESSION: Fluoroscopic guidance provided during surgical internal fixation of proximal right femoral fracture. Electronically Signed   By: Marijo Conception M.D.   On: 06/08/2022 13:24   DG Knee Right Port  Result Date: 06/07/2022 CLINICAL DATA:  Mechanical fall on right hip with hip fracture EXAM: PORTABLE RIGHT KNEE - 1-2 VIEW COMPARISON:  Radiographs 06/07/2022 FINDINGS: No acute fracture or dislocation of the right knee. Demineralization. Mild degenerative arthritis. No knee joint effusion. IMPRESSION: No acute fracture or dislocation. Electronically Signed   By: Placido Sou M.D.   On: 06/07/2022 17:29   DG Chest Port 1 View  Result Date: 06/07/2022 CLINICAL DATA:  Preop imaging EXAM: PORTABLE CHEST 1 VIEW COMPARISON:  Chest x-ray dated July 15, 2014 FINDINGS: Cardiac and mediastinal contours unchanged. Large hiatal hernia, increased in size when compared with the prior exam. Mild left basilar opacity, likely due to atelectasis. No large pleural effusion or evidence of pneumothorax. Degenerative changes of the left-greater-than-right shoulders. IMPRESSION: 1.  Mild left basilar opacity, likely due to atelectasis. 2. Large hiatal hernia, increased in size when compared with the prior exam. Electronically Signed   By: Yetta Glassman M.D.   On: 06/07/2022 13:05   DG Hip Unilat With Pelvis 2-3 Views Right  Result Date: 06/07/2022 CLINICAL DATA:  Fall EXAM: DG HIP (WITH OR WITHOUT PELVIS) 2-3V RIGHT COMPARISON:  Hip radiographs 07/27/2021 FINDINGS: There is an acute mildly proximally displaced right intertrochanteric fracture. Femoroacetabular alignment is normal. The SI  joints and symphysis pubis are intact. No acute fracture is seen on the left. A calcified uterine fibroid is again noted. IMPRESSION: Acute mildly proximally displaced right intertrochanteric fracture. Electronically Signed   By: Valetta Mole M.D.   On: 06/07/2022 12:32    Scheduled Meds:  aspirin EC  325 mg Oral Q breakfast   Chlorhexidine Gluconate Cloth  6 each Topical Q0600   docusate sodium  100 mg Oral BID   enoxaparin (LOVENOX) injection  30 mg Subcutaneous Q24H   feeding supplement  1 Container Oral BID BM   mupirocin ointment  1 Application Nasal BID   senna  1 tablet Oral BID   Continuous Infusions:  methocarbamol (ROBAXIN) IV       LOS: 2 days   Darliss Cheney, MD [facility]  06/09/2022, 11:11 AM   *Please note that this is a verbal dictation therefore  any spelling or grammatical errors are due to the "[place]" system interpretation.  Please page via [place] and do not message via secure chat for urgent patient care matters. Secure chat can be used for non urgent patient care matters.  How to contact the [facility] Attending or Consulting provider [place] or covering provider during after hours [place], for this patient?  Check the care team in [facility] and look for a) attending/consulting [facility] provider listed and b) the [facility] team listed. Page or secure chat [phone]. Log into [email or web address] and use 's universal password to access. If you do not have the password, please contact the hospital operator. Locate the [facility] provider you are looking for under [facility] and page to a number that you can be directly reached. If you still have difficulty reaching the provider, please page the [facility] (Director on Call) for the Hospitalists listed on amion for assistance.

## 2022-06-10 DIAGNOSIS — S72001A Fracture of unspecified part of neck of right femur, initial encounter for closed fracture: Secondary | ICD-10-CM | POA: Diagnosis not present

## 2022-06-10 LAB — CBC
HCT: 31.2 % — ABNORMAL LOW (ref 36.0–46.0)
Hemoglobin: 10.6 g/dL — ABNORMAL LOW (ref 12.0–15.0)
MCH: 31.4 pg (ref 26.0–34.0)
MCHC: 34 g/dL (ref 30.0–36.0)
MCV: 92.3 fL (ref 80.0–100.0)
Platelets: 182 10*3/uL (ref 150–400)
RBC: 3.38 MIL/uL — ABNORMAL LOW (ref 3.87–5.11)
RDW: 13 % (ref 11.5–15.5)
WBC: 6.1 10*3/uL (ref 4.0–10.5)
nRBC: 0 % (ref 0.0–0.2)

## 2022-06-10 LAB — BASIC METABOLIC PANEL
Anion gap: 6 (ref 5–15)
BUN: 12 mg/dL (ref 8–23)
CO2: 25 mmol/L (ref 22–32)
Calcium: 9 mg/dL (ref 8.9–10.3)
Chloride: 103 mmol/L (ref 98–111)
Creatinine, Ser: 0.84 mg/dL (ref 0.44–1.00)
GFR, Estimated: >60 mL/min (ref >60)
Glucose, Bld: 102 mg/dL — ABNORMAL HIGH (ref 70–99)
Potassium: 4 mmol/L (ref 3.5–5.1)
Sodium: 134 mmol/L — ABNORMAL LOW (ref 135–145)

## 2022-06-10 NOTE — Progress Notes (Signed)
Physical Therapy Treatment Patient Details Name: Krystal Moore MRN: [id number] DOB: 01/17/1930 Today's Date: 06/10/2022   History of Present Illness Krystal Moore is a 88 y.o. female who presented with an intertrochanteric femur fracture after a fall. Pt underwent Intramedullary fixation, Right femur on 06/07/22.  Pt with hx of Narcolepsy, polyneuropathy, and L shoulder fx    PT Comments    Pt is progressing with mobility. She ambulated 8' with RW with min/guard assist. Instructed pt in RLE ROM exercises, which she tolerated well. Pt reports pain is moderate with activity.    Recommendations for follow up therapy are one component of a multi-disciplinary discharge planning process, led by the attending physician.  Recommendations may be updated based on patient status, additional functional criteria and insurance authorization.  Follow Up Recommendations  Skilled nursing-short term rehab (<3 hours/day) Can patient physically be transported by private vehicle: No   Assistance Recommended at Discharge Frequent or constant Supervision/Assistance  Patient can return home with the following A lot of help with bathing/dressing/bathroom;Assistance with cooking/housework;Assist for transportation;Help with stairs or ramp for entrance;A lot of help with walking and/or transfers   Equipment Recommendations  None recommended by PT    Recommendations for Other Services       Precautions / Restrictions Precautions Precautions: Fall Restrictions Weight Bearing Restrictions: No RLE Weight Bearing: Weight bearing as tolerated     Mobility  Bed Mobility               General bed mobility comments: up in recliner    Transfers Overall transfer level: Needs assistance Equipment used: Rolling walker (2 wheels)   Sit to Stand: Mod assist, +2 physical assistance           General transfer comment: cues for LE management and use of UEs to self assist.  Physical  assist to bring wt up and fwd and to balance in standing with RW    Ambulation/Gait Ambulation/Gait assistance: Min guard Gait Distance (Feet): 8 Feet Assistive device: Rolling walker (2 wheels) Gait Pattern/deviations: Step-to pattern, Decreased step length - right, Decreased step length - left, Shuffle, Trunk flexed Gait velocity: decr     General Gait Details: Increased time with cues for sequence, posture and position from RW, distance limited by pain and fatigue   Stairs             Wheelchair Mobility    Modified Rankin (Stroke Patients Only)       Balance Overall balance assessment: Needs assistance Sitting-balance support: Bilateral upper extremity supported, Feet supported Sitting balance-Leahy Scale: Fair     Standing balance support: Bilateral upper extremity supported, During functional activity, Reliant on assistive device for balance Standing balance-Leahy Scale: Poor                              Cognition Arousal/Alertness: Awake/alert Behavior During Therapy: WFL for tasks assessed/performed Overall Cognitive Status: Within Functional Limits for tasks assessed                                          Exercises General Exercises - Lower Extremity Ankle Circles/Pumps: Both, 10 reps, AROM, Supine Short Arc Quad: AROM, Right, 10 reps, Supine Heel Slides: AAROM, Right, 10 reps, Supine Hip ABduction/ADduction: AAROM, Right, 10 reps, Supine    General Comments  Pertinent Vitals/Pain Pain Assessment Pain Score: 5  Pain Location: R hip Pain Descriptors / Indicators: Sore Pain Intervention(s): Limited activity within patient's tolerance, Monitored during session, Premedicated before session, Ice applied    Home Living                          Prior Function            PT Goals (current goals can now be found in the care plan section) Acute Rehab PT Goals Patient Stated Goal: Regain IND PT Goal  Formulation: With patient Time For Goal Achievement: 06/29/22 Potential to Achieve Goals: Fair Progress towards PT goals: Progressing toward goals    Frequency    Min 3X/week      PT Plan Current plan remains appropriate    Co-evaluation PT/OT/SLP Co-Evaluation/Treatment: Yes            AM-PAC PT "6 Clicks" Mobility   Outcome Measure  Help needed turning from your back to your side while in a flat bed without using bedrails?: A Lot Help needed moving from lying on your back to sitting on the side of a flat bed without using bedrails?: A Lot Help needed moving to and from a bed to a chair (including a wheelchair)?: A Lot Help needed standing up from a chair using your arms (e.g., wheelchair or bedside chair)?: A Lot Help needed to walk in hospital room?: A Little Help needed climbing 3-5 steps with a railing? : A Lot 6 Click Score: 13    End of Session Equipment Utilized During Treatment: Gait belt Activity Tolerance: Patient tolerated treatment well Patient left: in chair;with chair alarm set;with call bell/phone within reach Nurse Communication: Mobility status PT Visit Diagnosis: Unsteadiness on feet (R26.81);History of falling (Z91.81);Difficulty in walking, not elsewhere classified (R26.2);Pain Pain - Right/Left: Right Pain - part of body: Hip     Time: 2993-7169 PT Time Calculation (min) (ACUTE ONLY): 19 min  Charges:  $Gait Training: 8-22 mins                     Blondell Reveal Kistler PT 06/10/2022  Acute Rehabilitation Services  Office [phone]

## 2022-06-10 NOTE — Plan of Care (Signed)
  Problem: Education: Goal: Knowledge of General Education information will improve Description: Including pain rating scale, medication(s)/side effects and non-pharmacologic comfort measures Outcome: Progressing   Problem: Activity: Goal: Risk for activity intolerance will decrease Outcome: Progressing   Problem: Pain Managment: Goal: General experience of comfort will improve Outcome: Progressing   

## 2022-06-10 NOTE — Progress Notes (Signed)
PROGRESS NOTE    Christina Gintz  GBT:[id number] DOB: Nov 28, 1929 DOA: 06/07/2022 PCP: Ladell Pier, MD   Brief Narrative:   Krystal Moore is a 88 y.o. female with medical history significant of hypothyroidism. Presenting with right hip pain after fall. She reports that she was in her normal state of health prior to her fall today. She was walking outside and ran into a tree branch. It grazed the top of her head and caused her to lose her balance. She felt to her right side and was unable to get up on her own. She did not hit her head against the ground. She did not suffer LOC. She didn't have CP, palpitations, lightheadedness or dizziness prior to her fall. Her neighbors saw the incident as it happened. They called for EMS.  Her work-up is significant for right hip fracture, she is admitted for further management  Assessment & Plan:   Principal Problem:   Closed right hip fracture [facility]) Active Problems:   Hypothyroidism   Fall at home, initial encounter  Fall/right hip fracture: Status postsurgical repair by general surgery.  Patient is doing well, pain fairly controlled.  Seen by PT OT and they recommend SNF.  TOC on board.  Per TOC, her insurance is difficult and takes a long time for authorization, waiting for authorization currently.  Acute blood loss anemia: Hemoglobin dropped from 12.6-10.9 > 10.6 today but no indication of transfusion.  Likely blood loss from surgery.  Monitor closely.  Acquired hypothyroidism: Continue Synthroid.  DVT prophylaxis: enoxaparin (LOVENOX) injection 40 mg Start: 06/10/22 1000 SCDs Start: 06/08/22 1511   Code Status: Full Code  Family Communication:  None present at bedside.  Plan of care discussed with patient in length and he/she verbalized understanding and agreed with it.  Status is: Inpatient Remains inpatient appropriate because: Medically stable, waiting for insurance authorization/discharge to SNF.   Estimated  body mass index is 29.02 kg/m as calculated from the following:   Height as of this encounter: '5\' 3"'$  (1.6 m).   Weight as of this encounter: 74.3 kg.    Nutritional Assessment: Body mass index is 29.02 kg/m.Marland Kitchen Seen by dietician.  I agree with the assessment and plan as outlined below: Nutrition Status: Nutrition Problem: Increased nutrient needs Etiology: hip fracture, post-op healing Signs/Symptoms: estimated needs Interventions: Boost Breeze  . Skin Assessment: I have examined the patient's skin and I agree with the wound assessment as performed by the wound care RN as outlined below:    Consultants:  Orthopedics  Procedures:  As above  Antimicrobials:  Anti-infectives (From admission, onward)    Start     Dose/Rate Route Frequency Ordered Stop   06/08/22 1830  ceFAZolin (ANCEF) IVPB 2g/100 mL premix        2 g 200 mL/hr over 30 Minutes Intravenous Every 6 hours 06/08/22 1511 06/09/22 0032   06/08/22 0600  ceFAZolin (ANCEF) IVPB 2g/100 mL premix        2 g 200 mL/hr over 30 Minutes Intravenous On call to O.R. 06/07/22 1647 06/08/22 1250         Subjective: Seen and examined.  Pleasant as always.  No complaints.  Slept well.  Objective: Vitals:   06/09/22 1034 06/09/22 1300 06/09/22 2114 06/10/22 0446  BP: 112/72 (!) 147/58 (!) 124/50 (!) 125/55  Pulse: 63 63 65 (!) 59  Resp: '18 17 18 18  '$ Temp: 98.4 F (36.9 C) 98.4 F (36.9 C) 97.6 F (36.4 C) 98.1 F (36.7  C)  TempSrc:   Oral Oral  SpO2: 96% 100% 99% 96%  Weight:      Height:        Intake/Output Summary (Last 24 hours) at 06/10/2022 1113 Last data filed at 06/10/2022 1000 Gross per 24 hour  Intake 950 ml  Output 2100 ml  Net -1150 ml    Filed Weights   06/07/22 1906 06/08/22 1045  Weight: 74.3 kg 74.3 kg    Examination:  General exam: Appears calm and comfortable  Respiratory system: Clear to auscultation. Respiratory effort normal. Cardiovascular system: S1 & S2 heard, RRR. No JVD,  murmurs, rubs, gallops or clicks. No pedal edema. Gastrointestinal system: Abdomen is nondistended, soft and nontender. No organomegaly or masses felt. Normal bowel sounds heard. Central nervous system: Alert and oriented. No focal neurological deficits. Skin: No rashes, lesions or ulcers.  Psychiatry: Judgement and insight appear normal. Mood & affect appropriate.   Data Reviewed: I have personally reviewed following labs and imaging studies  CBC: Recent Labs  Lab 06/07/22 1149 06/09/22 0339 06/10/22 0353  WBC 4.7 5.1 6.1  NEUTROABS 2.5  --   --   HGB 12.6 10.9* 10.6*  HCT 36.5 31.5* 31.2*  MCV 91.5 91.3 92.3  PLT 209 162 275    Basic Metabolic Panel: Recent Labs  Lab 06/07/22 1149 06/09/22 0339 06/10/22 0353  NA 136 134* 134*  K 4.0 3.8 4.0  CL 103 104 103  CO2 '25 24 25  '$ GLUCOSE 124* 179* 102*  BUN '13 13 12  '$ CREATININE 0.87 0.87 0.84  CALCIUM 9.3 8.8* 9.0    GFR: Estimated Creatinine Clearance: 43.9 mL/min (by C-G formula based on SCr of 0.84 mg/dL). Liver Function Tests: No results for input(s): "AST", "ALT", "ALKPHOS", "BILITOT", "PROT", "ALBUMIN" in the last 168 hours. No results for input(s): "LIPASE", "AMYLASE" in the last 168 hours. No results for input(s): "AMMONIA" in the last 168 hours. Coagulation Profile: Recent Labs  Lab 06/07/22 1149  INR 1.0    Cardiac Enzymes: No results for input(s): "CKTOTAL", "CKMB", "CKMBINDEX", "TROPONINI" in the last 168 hours. BNP (last 3 results) No results for input(s): "PROBNP" in the last 8760 hours. HbA1C: No results for input(s): "HGBA1C" in the last 72 hours. CBG: No results for input(s): "GLUCAP" in the last 168 hours. Lipid Profile: No results for input(s): "CHOL", "HDL", "LDLCALC", "TRIG", "CHOLHDL", "LDLDIRECT" in the last 72 hours. Thyroid Function Tests: No results for input(s): "TSH", "T4TOTAL", "FREET4", "T3FREE", "THYROIDAB" in the last 72 hours. Anemia Panel: No results for input(s):  "VITAMINB12", "FOLATE", "FERRITIN", "TIBC", "IRON", "RETICCTPCT" in the last 72 hours. Sepsis Labs: No results for input(s): "PROCALCITON", "LATICACIDVEN" in the last 168 hours.  Recent Results (from the past 240 hour(s))  Surgical pcr screen     Status: Abnormal   Collection Time: 06/07/22  7:09 PM   Specimen: Nasal Mucosa; Nasal Swab  Result Value Ref Range Status   MRSA, PCR NEGATIVE NEGATIVE Final   Staphylococcus aureus POSITIVE (A) NEGATIVE Final    Comment: (NOTE) The Xpert SA Assay (FDA approved for NASAL specimens in patients 74 years of age and older), is one component of a comprehensive surveillance program. It is not intended to diagnose infection nor to guide or monitor treatment. Performed at [facility], [place] [address], [place], [place] [address]      Radiology Studies: Pelvis Portable  Result Date: 06/08/2022 CLINICAL DATA:  Post RIGHT femoral nailing EXAM: PORTABLE PELVIS 1-2 VIEWS COMPARISON:  Portable exam 1419 hours  without recent priors for comparison FINDINGS: IM nail with compression screw and distal locking screw identified in RIGHT femur post ORIF. Question underlying nondisplaced intertrochanteric fracture RIGHT femur. Bones appear demineralized. Hip and SI joint spaces preserved. No fracture or dislocation. IMPRESSION: Post ORIF of proximal RIGHT femur. Electronically Signed   By: Lavonia Dana M.D.   On: 06/08/2022 14:39   DG C-Arm 1-60 Min-No Report  Result Date: 06/08/2022 CLINICAL DATA:  Surgical internal fixation of right femur fracture. EXAM: DG HIP (WITH OR WITHOUT PELVIS) 1V RIGHT; DG C-ARM 1-60 MIN-NO REPORT Radiation exposure index: 6.7608 mGy. COMPARISON:  June 07, 2022. FINDINGS: Two intraoperative fluoroscopic images were obtained of the right hip. These demonstrate the patient to be status post surgical internal fixation of proximal right femoral fracture. IMPRESSION: Fluoroscopic guidance provided during surgical internal  fixation of proximal right femoral fracture. Electronically Signed   By: Marijo Conception M.D.   On: 06/08/2022 13:24   DG HIP UNILAT WITH PELVIS 1V RIGHT  Result Date: 06/08/2022 CLINICAL DATA:  Surgical internal fixation of right femur fracture. EXAM: DG HIP (WITH OR WITHOUT PELVIS) 1V RIGHT; DG C-ARM 1-60 MIN-NO REPORT Radiation exposure index: 6.7608 mGy. COMPARISON:  June 07, 2022. FINDINGS: Two intraoperative fluoroscopic images were obtained of the right hip. These demonstrate the patient to be status post surgical internal fixation of proximal right femoral fracture. IMPRESSION: Fluoroscopic guidance provided during surgical internal fixation of proximal right femoral fracture. Electronically Signed   By: Marijo Conception M.D.   On: 06/08/2022 13:24    Scheduled Meds:  aspirin EC  325 mg Oral Q breakfast   Chlorhexidine Gluconate Cloth  6 each Topical Q0600   docusate sodium  100 mg Oral BID   enoxaparin (LOVENOX) injection  40 mg Subcutaneous Q24H   feeding supplement  1 Container Oral BID BM   mupirocin ointment  1 Application Nasal BID   senna  1 tablet Oral BID   Continuous Infusions:  methocarbamol (ROBAXIN) IV       LOS: 3 days   Darliss Cheney, MD [facility]  06/10/2022, 11:13 AM   *Please note that this is a verbal dictation therefore any spelling or grammatical errors are due to the "[place] [employer]" system interpretation.  Please page via [place] and do not message via secure chat for urgent patient care matters. Secure chat can be used for non urgent patient care matters.  How to contact the [facility] Attending or Consulting provider [place] or covering provider during after hours [place], for this patient?  Check the care team in [facility] and look for a) attending/consulting [facility] provider listed and b) the [facility] team listed. Page or secure chat [phone]. Log into [email or web address] and use [place]'s universal password to access. If you do not have the password, please contact  the hospital operator. Locate the [facility] provider you are looking for under [facility] and page to a number that you can be directly reached. If you still have difficulty reaching the provider, please page the [facility] (Director on Call) for the Hospitalists listed on amion for assistance.

## 2022-06-11 DIAGNOSIS — S72001A Fracture of unspecified part of neck of right femur, initial encounter for closed fracture: Secondary | ICD-10-CM | POA: Diagnosis not present

## 2022-06-11 LAB — BASIC METABOLIC PANEL
Anion gap: 4 — ABNORMAL LOW (ref 5–15)
BUN: 8 mg/dL (ref 8–23)
CO2: 26 mmol/L (ref 22–32)
Calcium: 9.1 mg/dL (ref 8.9–10.3)
Chloride: 107 mmol/L (ref 98–111)
Creatinine, Ser: 0.81 mg/dL (ref 0.44–1.00)
GFR, Estimated: >60 mL/min (ref >60)
Glucose, Bld: 98 mg/dL (ref 70–99)
Potassium: 4.1 mmol/L (ref 3.5–5.1)
Sodium: 137 mmol/L (ref 135–145)

## 2022-06-11 LAB — CBC
HCT: 34.7 % — ABNORMAL LOW (ref 36.0–46.0)
Hemoglobin: 11.5 g/dL — ABNORMAL LOW (ref 12.0–15.0)
MCH: 31.4 pg (ref 26.0–34.0)
MCHC: 33.1 g/dL (ref 30.0–36.0)
MCV: 94.8 fL (ref 80.0–100.0)
Platelets: 210 10*3/uL (ref 150–400)
RBC: 3.66 MIL/uL — ABNORMAL LOW (ref 3.87–5.11)
RDW: 13.2 % (ref 11.5–15.5)
WBC: 5.4 10*3/uL (ref 4.0–10.5)
nRBC: 0 % (ref 0.0–0.2)

## 2022-06-11 NOTE — Plan of Care (Signed)

## 2022-06-11 NOTE — Progress Notes (Signed)
PROGRESS NOTE    Krystal Moore  OIN:[id number] DOB: [birth year] DOA: 06/07/2022 PCP: Ladell Pier, MD   Brief Narrative:   Krystal Moore is a 88 y.o. female with medical history significant of hypothyroidism. Presenting with right hip pain after fall. She reports that she was in her normal state of health prior to her fall today. She was walking outside and ran into a tree branch. It grazed the top of her head and caused her to lose her balance. She felt to her right side and was unable to get up on her own. She did not hit her head against the ground. She did not suffer LOC. She didn't have CP, palpitations, lightheadedness or dizziness prior to her fall. Her neighbors saw the incident as it happened. They called for EMS.  Her work-up is significant for right hip fracture, she is admitted for further management  Assessment & Plan:   Principal Problem:   Closed right hip fracture [facility]) Active Problems:   Hypothyroidism   Fall at home, initial encounter  Fall/right hip fracture: Status postsurgical repair by general surgery.  Patient is doing well, pain fairly controlled.  Seen by PT OT and they recommend SNF.  TOC on board.  Per TOC, her insurance is difficult and takes a long time for authorization, waiting for authorization currently.  Acute blood loss anemia: Hemoglobin dropped from 12.6-10.9 > 10.6  but no indication of transfusion.  Likely blood loss from surgery.  Monitor closely.  Acquired hypothyroidism: Continue Synthroid.  DVT prophylaxis: enoxaparin (LOVENOX) injection 40 mg Start: 06/10/22 1000 SCDs Start: 06/08/22 1511   Code Status: Full Code  Family Communication:  None present at bedside.  Plan of care discussed with patient in length and he/she verbalized understanding and agreed with it.  Status is: Inpatient Remains inpatient appropriate because: Medically stable, waiting for insurance authorization/discharge to SNF.   Estimated body  mass index is 29.02 kg/m as calculated from the following:   Height as of this encounter: '5\' 3"'$  (1.6 m).   Weight as of this encounter: 74.3 kg.    Nutritional Assessment: Body mass index is 29.02 kg/m.Marland Kitchen Seen by dietician.  I agree with the assessment and plan as outlined below: Nutrition Status: Nutrition Problem: Increased nutrient needs Etiology: hip fracture, post-op healing Signs/Symptoms: estimated needs Interventions: Boost Breeze  . Skin Assessment: I have examined the patient's skin and I agree with the wound assessment as performed by the wound care RN as outlined below:    Consultants:  Orthopedics  Procedures:  As above  Antimicrobials:  Anti-infectives (From admission, onward)    Start     Dose/Rate Route Frequency Ordered Stop   06/08/22 1830  ceFAZolin (ANCEF) IVPB 2g/100 mL premix        2 g 200 mL/hr over 30 Minutes Intravenous Every 6 hours 06/08/22 1511 06/09/22 0032   06/08/22 0600  ceFAZolin (ANCEF) IVPB 2g/100 mL premix        2 g 200 mL/hr over 30 Minutes Intravenous On call to O.R. 06/07/22 1647 06/08/22 1250         Subjective: Seen and examined.  No complaints other than that she could not sleep well last night due to 2 episodes of muscle spasm.  Objective: Vitals:   06/10/22 0446 06/10/22 1337 06/10/22 2157 06/11/22 0330  BP: (!) 125/55 (!) 119/54 (!) 118/59 (!) 149/62  Pulse: (!) 59 62 64 71  Resp: '18 18 14 16  '$ Temp: 98.1 F (36.7 C)  98.6 F (37 C) 97.6 F (36.4 C) 97.9 F (36.6 C)  TempSrc: Oral Oral  Oral  SpO2: 96% 93% 97% 97%  Weight:      Height:        Intake/Output Summary (Last 24 hours) at 06/11/2022 1155 Last data filed at 06/11/2022 0900 Gross per 24 hour  Intake 1260 ml  Output --  Net 1260 ml    Filed Weights   06/07/22 1906 06/08/22 1045  Weight: 74.3 kg 74.3 kg    Examination:  General exam: Appears calm and comfortable  Respiratory system: Clear to auscultation. Respiratory effort  normal. Cardiovascular system: S1 & S2 heard, RRR. No JVD, murmurs, rubs, gallops or clicks. No pedal edema. Gastrointestinal system: Abdomen is nondistended, soft and nontender. No organomegaly or masses felt. Normal bowel sounds heard. Central nervous system: Alert and oriented. No focal neurological deficits. Skin: No rashes, lesions or ulcers.  Psychiatry: Judgement and insight appear normal. Mood & affect appropriate.    Data Reviewed: I have personally reviewed following labs and imaging studies  CBC: Recent Labs  Lab 06/07/22 1149 06/09/22 0339 06/10/22 0353 06/11/22 0629  WBC 4.7 5.1 6.1 5.4  NEUTROABS 2.5  --   --   --   HGB 12.6 10.9* 10.6* 11.5*  HCT 36.5 31.5* 31.2* 34.7*  MCV 91.5 91.3 92.3 94.8  PLT 209 162 182 151    Basic Metabolic Panel: Recent Labs  Lab 06/07/22 1149 06/09/22 0339 06/10/22 0353 06/11/22 0629  NA 136 134* 134* 137  K 4.0 3.8 4.0 4.1  CL 103 104 103 107  CO2 '25 24 25 26  '$ GLUCOSE 124* 179* 102* 98  BUN '13 13 12 8  '$ CREATININE 0.87 0.87 0.84 0.81  CALCIUM 9.3 8.8* 9.0 9.1    GFR: Estimated Creatinine Clearance: 45.5 mL/min (by C-G formula based on SCr of 0.81 mg/dL). Liver Function Tests: No results for input(s): "AST", "ALT", "ALKPHOS", "BILITOT", "PROT", "ALBUMIN" in the last 168 hours. No results for input(s): "LIPASE", "AMYLASE" in the last 168 hours. No results for input(s): "AMMONIA" in the last 168 hours. Coagulation Profile: Recent Labs  Lab 06/07/22 1149  INR 1.0    Cardiac Enzymes: No results for input(s): "CKTOTAL", "CKMB", "CKMBINDEX", "TROPONINI" in the last 168 hours. BNP (last 3 results) No results for input(s): "PROBNP" in the last 8760 hours. HbA1C: No results for input(s): "HGBA1C" in the last 72 hours. CBG: No results for input(s): "GLUCAP" in the last 168 hours. Lipid Profile: No results for input(s): "CHOL", "HDL", "LDLCALC", "TRIG", "CHOLHDL", "LDLDIRECT" in the last 72 hours. Thyroid Function  Tests: No results for input(s): "TSH", "T4TOTAL", "FREET4", "T3FREE", "THYROIDAB" in the last 72 hours. Anemia Panel: No results for input(s): "VITAMINB12", "FOLATE", "FERRITIN", "TIBC", "IRON", "RETICCTPCT" in the last 72 hours. Sepsis Labs: No results for input(s): "PROCALCITON", "LATICACIDVEN" in the last 168 hours.  Recent Results (from the past 240 hour(s))  Surgical pcr screen     Status: Abnormal   Collection Time: 06/07/22  7:09 PM   Specimen: Nasal Mucosa; Nasal Swab  Result Value Ref Range Status   MRSA, PCR NEGATIVE NEGATIVE Final   Staphylococcus aureus POSITIVE (A) NEGATIVE Final    Comment: (NOTE) The Xpert SA Assay (FDA approved for NASAL specimens in patients 43 years of age and older), is one component of a comprehensive surveillance program. It is not intended to diagnose infection nor to guide or monitor treatment. Performed at [facility], [place] [address], [place], [place] [address]  Radiology Studies: No results found.  Scheduled Meds:  aspirin EC  325 mg Oral Q breakfast   Chlorhexidine Gluconate Cloth  6 each Topical Q0600   docusate sodium  100 mg Oral BID   enoxaparin (LOVENOX) injection  40 mg Subcutaneous Q24H   feeding supplement  1 Container Oral BID BM   mupirocin ointment  1 Application Nasal BID   senna  1 tablet Oral BID   Continuous Infusions:  methocarbamol (ROBAXIN) IV       LOS: 4 days   Darliss Cheney, MD [facility]  06/11/2022, 11:55 AM   *Please note that this is a verbal dictation therefore any spelling or grammatical errors are due to the "[place] [employer]" system interpretation.  Please page via [place] and do not message via secure chat for urgent patient care matters. Secure chat can be used for non urgent patient care matters.  How to contact the [facility] Attending or Consulting provider [place] or covering provider during after hours [place], for this patient?  Check the care team in [facility] and look  for a) attending/consulting TR[facility] provider listed and b) the [facility] team listed. Page or secure chat [phone]. Log into [email or web address] and use [place]'s universal password to access. If you do not have the password, please contact the hospital operator. Locate the [facility] provider you are looking for under [facility] and page to a number that you can be directly reached. If you still have difficulty reaching the provider, please page the [facility] (Director on Call) for the Hospitalists listed on amion for assistance.

## 2022-06-11 NOTE — Progress Notes (Signed)
Occupational Therapy Treatment Patient Details Name: Krystal Moore MRN: [id number] DOB: [birth year] Today's Date: 06/11/2022   History of present illness Krystal Moore is a 88 y.o. female who presented with an intertrochanteric femur fracture after a fall. Pt underwent Intramedullary fixation, Right femur on 06/07/22.  Pt with hx of Narcolepsy, polyneuropathy, and L shoulder fx   OT comments  Patient was educated on using AE for LB dressing. Patient verbalized and demonstrated understanding with min A. Patient was noted too report that she planned to go home. Patient was educated on importance of short term rehab prior to d/c home. Patient verbalized understanding. Patient would need 24/7 caregiver physical assist in next level of care to be successful at this time. Patient continues to have increased pain, decreased functional activity tolerance, decreased standing balance and decreased knowledge of AE/AD impacting participation in ADLs. Patient would continue to benefit from skilled OT services at this time while admitted and after d/c to address noted deficits in order to improve overall safety and independence in ADLs.     Recommendations for follow up therapy are one component of a multi-disciplinary discharge planning process, led by the attending physician.  Recommendations may be updated based on patient status, additional functional criteria and insurance authorization.    Follow Up Recommendations  Skilled nursing-short term rehab (<3 hours/day)    Assistance Recommended at Discharge Frequent or constant Supervision/Assistance  Patient can return home with the following  Two people to help with walking and/or transfers;A lot of help with bathing/dressing/bathroom;Direct supervision/assist for financial management;Assist for transportation;Help with stairs or ramp for entrance;Assistance with cooking/housework   Equipment Recommendations  BSC/3in1 (total hip kit)     Recommendations for Other Services      Precautions / Restrictions Precautions Precautions: Fall Restrictions Weight Bearing Restrictions: No RLE Weight Bearing: Weight bearing as tolerated       Mobility Bed Mobility               General bed mobility comments: up in recliner    Transfers                         Balance                                           ADL either performed or assessed with clinical judgement   ADL Overall ADL's : Needs assistance/impaired                       Lower Body Dressing Details (indicate cue type and reason): patient was educated on using sock aid and reacher to don/doff socks. patient was also introduced to total hip kit. patient verbalized and demonstrated understanding. patient was able to don sock with min A with increased time and doff. patient was educated on benefits of each tool in kit.   Toilet Transfer Details (indicate cue type and reason): patient was able to stand with min A and participate in functional mobiltiy to and from the bathroom door in room with increased time and noted limited WB on RLE. patient taking short choppy steps.                Extremity/Trunk Assessment              Vision       Perception  Praxis      Cognition Arousal/Alertness: Awake/alert Behavior During Therapy: WFL for tasks assessed/performed Overall Cognitive Status: Difficult to assess                                 General Comments: noted to have some confusion with appropirateness of transitioning home in current limited state        Exercises      Shoulder Instructions       General Comments      Pertinent Vitals/ Pain       Pain Assessment Pain Assessment: Faces Faces Pain Scale: Hurts little more Pain Location: R hip Pain Descriptors / Indicators: Sore Pain Intervention(s): Limited activity within patient's tolerance, Monitored during session,  Premedicated before session, Repositioned  Home Living                                          Prior Functioning/Environment              Frequency  Min 2X/week        Progress Toward Goals  OT Goals(current goals can now be found in the care plan section)  Progress towards OT goals: Progressing toward goals     Plan Discharge plan remains appropriate    Co-evaluation                 AM-PAC OT "6 Clicks" Daily Activity     Outcome Measure   Help from another person eating meals?: A Little Help from another person taking care of personal grooming?: A Little Help from another person toileting, which includes using toliet, bedpan, or urinal?: A Lot Help from another person bathing (including washing, rinsing, drying)?: A Lot Help from another person to put on and taking off regular upper body clothing?: A Little Help from another person to put on and taking off regular lower body clothing?: A Lot 6 Click Score: 15    End of Session Equipment Utilized During Treatment: Gait belt;Rolling walker (2 wheels)  OT Visit Diagnosis: Unsteadiness on feet (R26.81);Other symptoms and signs involving cognitive function;Pain Pain - Right/Left: Right Pain - part of body: Leg   Activity Tolerance Patient limited by pain   Patient Left with call bell/phone within reach;with chair alarm set;in chair   Nurse Communication Mobility status        Time: 4580-9983 OT Time Calculation (min): 23 min  Charges: OT General Charges $OT Visit: 1 Visit OT Treatments $Self Care/Home Management : 23-37 mins  Jackelyn Poling OTR/L, MS Acute Rehabilitation Department Office# [phone] Pager# [phone]   Marcellina Millin 06/11/2022, 4:03 PM

## 2022-06-11 NOTE — Plan of Care (Signed)
  Problem: Clinical Measurements: Goal: Diagnostic test results will improve Outcome: Progressing   Problem: Clinical Measurements: Goal: Respiratory complications will improve Outcome: Progressing   Problem: Clinical Measurements: Goal: Cardiovascular complication will be avoided Outcome: Progressing   Problem: Coping: Goal: Level of anxiety will decrease Outcome: Progressing   Problem: Activity: Goal: Ability to ambulate and perform ADLs will improve Outcome: Progressing   Problem: Clinical Measurements: Goal: Postoperative complications will be avoided or minimized Outcome: Progressing

## 2022-06-11 NOTE — Plan of Care (Signed)
Plan of care reviewed and discussed with the patient. 

## 2022-06-12 DIAGNOSIS — S72001A Fracture of unspecified part of neck of right femur, initial encounter for closed fracture: Secondary | ICD-10-CM | POA: Diagnosis not present

## 2022-06-12 NOTE — Progress Notes (Signed)
Physical Therapy Treatment Patient Details Name: Krystal Moore MRN: [id number] DOB: 11-May-1930 Today's Date: 06/12/2022   History of Present Illness Krystal Moore is a 88 y.o. female who presented with an intertrochanteric femur fracture after a fall. Pt underwent Intramedullary fixation, Right femur on 06/07/22.  Pt with hx of Narcolepsy, polyneuropathy, and L shoulder fx    PT Comments    Pt is progressing well with mobility, she tolerated an increased ambulation distance of 88' with RW, distance limited by pain and fatigue. Pt performed RLE exercises for ROM/strengthening and tolerated these well.    Recommendations for follow up therapy are one component of a multi-disciplinary discharge planning process, led by the attending physician.  Recommendations may be updated based on patient status, additional functional criteria and insurance authorization.  Follow Up Recommendations  Skilled nursing-short term rehab (<3 hours/day) Can patient physically be transported by private vehicle: Yes   Assistance Recommended at Discharge Frequent or constant Supervision/Assistance  Patient can return home with the following A lot of help with bathing/dressing/bathroom;Assistance with cooking/housework;Assist for transportation;Help with stairs or ramp for entrance;A little help with walking and/or transfers   Equipment Recommendations  None recommended by PT    Recommendations for Other Services       Precautions / Restrictions Precautions Precautions: Fall Restrictions Weight Bearing Restrictions: No RLE Weight Bearing: Weight bearing as tolerated     Mobility  Bed Mobility Overal bed mobility: Modified Independent Bed Mobility: Supine to Sit     Supine to sit: Modified independent (Device/Increase time), HOB elevated     General bed mobility comments: used bedrail    Transfers Overall transfer level: Needs assistance Equipment used: Rolling walker (2  wheels) Transfers: Sit to/from Stand Sit to Stand: Min assist           General transfer comment: VCs UE placement, min A to power up    Ambulation/Gait Ambulation/Gait assistance: Min guard Gait Distance (Feet): 38 Feet Assistive device: Rolling walker (2 wheels) Gait Pattern/deviations: Step-to pattern, Decreased step length - right, Decreased step length - left, Shuffle, Trunk flexed Gait velocity: decr     General Gait Details: Increased time with cues for sequence, posture and position from RW, distance limited by pain and fatigue   Stairs             Wheelchair Mobility    Modified Rankin (Stroke Patients Only)       Balance Overall balance assessment: Needs assistance Sitting-balance support: Bilateral upper extremity supported, Feet supported Sitting balance-Leahy Scale: Fair     Standing balance support: Bilateral upper extremity supported, During functional activity, Reliant on assistive device for balance Standing balance-Leahy Scale: Poor                              Cognition Arousal/Alertness: Awake/alert Behavior During Therapy: WFL for tasks assessed/performed Overall Cognitive Status: Within Functional Limits for tasks assessed                                          Exercises General Exercises - Lower Extremity Ankle Circles/Pumps: Both, AROM, Supine, 20 reps Short Arc Quad: AROM, Right, Supine, 20 reps Heel Slides: AAROM, Right, Supine, 20 reps Hip ABduction/ADduction: AAROM, Right, Supine, 20 reps    General Comments        Pertinent Vitals/Pain Pain Assessment Pain  Score: 7  Pain Location: R hip Pain Descriptors / Indicators: Sore Pain Intervention(s): Limited activity within patient's tolerance, Monitored during session, Premedicated before session, Ice applied, Repositioned    Home Living                          Prior Function            PT Goals (current goals can now be  found in the care plan section) Acute Rehab PT Goals Patient Stated Goal: Regain IND, playing the organ PT Goal Formulation: With patient Time For Goal Achievement: 06/29/22 Potential to Achieve Goals: Fair Progress towards PT goals: Progressing toward goals    Frequency    Min 3X/week      PT Plan Current plan remains appropriate    Co-evaluation              AM-PAC PT "6 Clicks" Mobility   Outcome Measure  Help needed turning from your back to your side while in a flat bed without using bedrails?: A Little Help needed moving from lying on your back to sitting on the side of a flat bed without using bedrails?: A Little Help needed moving to and from a bed to a chair (including a wheelchair)?: A Little Help needed standing up from a chair using your arms (e.g., wheelchair or bedside chair)?: A Little Help needed to walk in hospital room?: A Little Help needed climbing 3-5 steps with a railing? : A Lot 6 Click Score: 17    End of Session Equipment Utilized During Treatment: Gait belt Activity Tolerance: Patient tolerated treatment well Patient left: in chair;with chair alarm set;with call bell/phone within reach Nurse Communication: Mobility status PT Visit Diagnosis: Unsteadiness on feet (R26.81);History of falling (Z91.81);Difficulty in walking, not elsewhere classified (R26.2);Pain Pain - Right/Left: Right Pain - part of body: Hip     Time: 1023-1050 PT Time Calculation (min) (ACUTE ONLY): 27 min  Charges:  $Gait Training: 8-22 mins $Therapeutic Exercise: 8-22 mins                    Blondell Reveal Kistler PT 06/12/2022  Acute Rehabilitation Services  Office [phone]

## 2022-06-12 NOTE — Progress Notes (Signed)
    Subjective:  Patient reports pain as mild to moderate.  Denies N/V/CP/SOB/Abd pain. She states she is feeling better and better. She states she was able to take a few steps with PT yesterday, and that her leg was sore but she was able to do it. Denies tingling and numbness in LE bilaterally.   Objective:   VITALS:   Vitals:   06/11/22 0330 06/11/22 1355 06/11/22 2111 06/12/22 0504  BP: (!) 149/62 128/66 129/64 (!) 143/72  Pulse: 71 72 65 73  Resp: $Remo'16 18 18 18  'KcJUa$ Temp: 97.9 F (36.6 C) 98.7 F (37.1 C) 98.2 F (36.8 C) 99.1 F (37.3 C)  TempSrc: Oral  Oral Oral  SpO2: 97% 95% 96% 97%  Weight:      Height:        Patient sleeping, woke up for exam. NAD. Neurologically intact ABD soft Neurovascular intact Sensation intact distally Intact pulses distally Dorsiflexion/Plantar flexion intact No cellulitis present Compartment soft Mepilex dressings x2 C/D/I.  She has ice on her hip this morning.   Lab Results  Component Value Date   WBC 5.4 06/11/2022   HGB 11.5 (L) 06/11/2022   HCT 34.7 (L) 06/11/2022   MCV 94.8 06/11/2022   PLT 210 06/11/2022   BMET    Component Value Date/Time   NA 137 06/11/2022 0629   NA 134 12/28/2021 1119   K 4.1 06/11/2022 0629   CL 107 06/11/2022 0629   CO2 26 06/11/2022 0629   GLUCOSE 98 06/11/2022 0629   BUN 8 06/11/2022 0629   BUN 9 12/28/2021 1119   CREATININE 0.81 06/11/2022 0629   CALCIUM 9.1 06/11/2022 0629   EGFR 70 12/28/2021 1119   GFRNONAA >60 06/11/2022 0629     Assessment/Plan: 4 Days Post-Op   Principal Problem:   Closed right hip fracture (HCC) Active Problems:   Hypothyroidism   Fall at home, initial encounter   WBAT with walker DVT ppx: Aspirin, SCDs, TEDS PO pain control PT/OT: Patient ambulated 8 feet with PT yesterday.   Dispo: Patient under care of the medial team, disposition per their recommendation. Discussion between SNF vs home. Pain medication and DVT ppx printed in chart.    Charlott Rakes,  PA-C 06/12/2022, 8:01 AM   [facility]  [place] [address], [place], [place] [address]

## 2022-06-12 NOTE — Discharge Instructions (Signed)
 Dr. Brian Swinteck Adult Hip & Knee Specialist [facility] [address] [place], [place] [address] [phone]   POSTOPERATIVE DIRECTIONS    Hip Rehabilitation, Guidelines Following Surgery   WEIGHT BEARING Weight bearing as tolerated with assist device (walker, cane, etc) as directed, use it as long as suggested by your surgeon or therapist, typically at least 4-6 weeks.   HOME CARE INSTRUCTIONS  Remove items at home which could result in a fall. This includes throw rugs or furniture in walking pathways.  Continue medications as instructed at time of discharge.  You may have some home medications which will be placed on hold until you complete the course of blood thinner medication.  4 days after discharge, you may start showering. No tub baths or soaking your incisions. Do not put on socks or shoes without following the instructions of your caregivers.   Sit on chairs with arms. Use the chair arms to help push yourself up when arising.  Arrange for the use of a toilet seat elevator so you are not sitting low.   Walk with walker as instructed.  You may resume a sexual relationship in one month or when given the OK by your caregiver.  Use walker as long as suggested by your caregivers.  Avoid periods of inactivity such as sitting longer than an hour when not asleep. This helps prevent blood clots.  You may return to work once you are cleared by your surgeon.  Do not drive a car for 6 weeks or until released by your surgeon.  Do not drive while taking narcotics.  Wear elastic stockings for two weeks following surgery during the day but you may remove then at night.  Make sure you keep all of your appointments after your operation with all of your doctors and caregivers. You should call the office at the above phone number and make an appointment for approximately two weeks after the date of your surgery. Please pick up a stool softener and laxative  for home use as long as you are requiring pain medications.  ICE to the affected hip every three hours for 30 minutes at a time and then as needed for pain and swelling. Continue to use ice on the hip for pain and swelling from surgery. You may notice swelling that will progress down to the foot and ankle.  This is normal after surgery.  Elevate the leg when you are not up walking on it.   It is important for you to complete the blood thinner medication as prescribed by your doctor.  Continue to use the breathing machine which will help keep your temperature down.  It is common for your temperature to cycle up and down following surgery, especially at night when you are not up moving around and exerting yourself.  The breathing machine keeps your lungs expanded and your temperature down.  RANGE OF MOTION AND STRENGTHENING EXERCISES  These exercises are designed to help you keep full movement of your hip joint. Follow your caregiver's or physical therapist's instructions. Perform all exercises about fifteen times, three times per day or as directed. Exercise both hips, even if you have had only one joint replacement. These exercises can be done on a training (exercise) mat, on the floor, on a table or on a bed. Use whatever works the best and is most comfortable for you. Use music or television while you are exercising so that the exercises are a pleasant break in your day. This   will make your life better with the exercises acting as a break in routine you can look forward to.  Lying on your back, slowly slide your foot toward your buttocks, raising your knee up off the floor. Then slowly slide your foot back down until your leg is straight again.  Lying on your back spread your legs as far apart as you can without causing discomfort.  Lying on your side, raise your upper leg and foot straight up from the floor as far as is comfortable. Slowly lower the leg and repeat.  Lying on your back, tighten up the  muscle in the front of your thigh (quadriceps muscles). You can do this by keeping your leg straight and trying to raise your heel off the floor. This helps strengthen the largest muscle supporting your knee.  Lying on your back, tighten up the muscles of your buttocks both with the legs straight and with the knee bent at a comfortable angle while keeping your heel on the floor.   SKILLED REHAB INSTRUCTIONS: If the patient is transferred to a skilled rehab facility following release from the hospital, a list of the current medications will be sent to the facility for the patient to continue.  When discharged from the skilled rehab facility, please have the facility set up the patient's Home Health Physical Therapy prior to being released. Also, the skilled facility will be responsible for providing the patient with their medications at time of release from the facility to include their pain medication and their blood thinner medication. If the patient is still at the rehab facility at time of the two week follow up appointment, the skilled rehab facility will also need to assist the patient in arranging follow up appointment in our office and any transportation needs.  MAKE SURE YOU:  Understand these instructions.  Will watch your condition.  Will get help right away if you are not doing well or get worse.  Pick up stool softner and laxative for home use following surgery while on pain medications. Daily dry dressing changes as needed. In 4 days, you may remove your dressings and begin taking showers - no tub baths or soaking the incisions. Continue to use ice for pain and swelling after surgery. Do not use any lotions or creams on the incision until instructed by your surgeon.   

## 2022-06-12 NOTE — Progress Notes (Signed)
PROGRESS NOTE    Krystal Moore  XAJ:[id number] DOB: 09/09/1930 DOA: 06/07/2022 PCP: Ladell Pier, MD   Brief Narrative:   Krystal Moore is a 88 y.o. female with medical history significant of hypothyroidism. Presented with right hip pain after fall. She did not hit her head against the ground. She did not suffer LOC.  Her work-up is significant for right hip fracture, underwent surgical repair.  Waiting for placement.  Assessment & Plan:   Principal Problem:   Closed right hip fracture [facility]) Active Problems:   Hypothyroidism   Fall at home, initial encounter  Fall/right hip fracture: Status postsurgical repair by general surgery.  Patient is doing well, pain fairly controlled.  Seen by PT OT and they recommend SNF.  TOC on board.  Per TOC, her insurance is difficult and takes a long time for authorization, waiting for authorization currently.  Acute blood loss anemia: Hemoglobin dropped from 12.6-10.9 > 10.6  but no indication of transfusion.  Likely blood loss from surgery.  Monitor closely.  Acquired hypothyroidism: Continue Synthroid.  DVT prophylaxis: enoxaparin (LOVENOX) injection 40 mg Start: 06/10/22 1000 SCDs Start: 06/08/22 1511   Code Status: Full Code  Family Communication:  None present at bedside.  Plan of care discussed with patient in length and he/she verbalized understanding and agreed with it.  Status is: Inpatient Remains inpatient appropriate because: Medically stable, waiting for insurance authorization/discharge to SNF.   Estimated body mass index is 29.02 kg/m as calculated from the following:   Height as of this encounter: '5\' 3"'$  (1.6 m).   Weight as of this encounter: 74.3 kg.    Nutritional Assessment: Body mass index is 29.02 kg/m.Marland Kitchen Seen by dietician.  I agree with the assessment and plan as outlined below: Nutrition Status: Nutrition Problem: Increased nutrient needs Etiology: hip fracture, post-op  healing Signs/Symptoms: estimated needs Interventions: Boost Breeze  . Skin Assessment: I have examined the patient's skin and I agree with the wound assessment as performed by the wound care RN as outlined below:    Consultants:  Orthopedics  Procedures:  As above  Antimicrobials:  Anti-infectives (From admission, onward)    Start     Dose/Rate Route Frequency Ordered Stop   06/08/22 1830  ceFAZolin (ANCEF) IVPB 2g/100 mL premix        2 g 200 mL/hr over 30 Minutes Intravenous Every 6 hours 06/08/22 1511 06/09/22 0032   06/08/22 0600  ceFAZolin (ANCEF) IVPB 2g/100 mL premix        2 g 200 mL/hr over 30 Minutes Intravenous On call to O.R. 06/07/22 1647 06/08/22 1250         Subjective:  Seen and examined.  Sleepy today, she says she did not have good sleep last night.  Other than that no complaints.  Objective: Vitals:   06/11/22 0330 06/11/22 1355 06/11/22 2111 06/12/22 0504  BP: (!) 149/62 128/66 129/64 (!) 143/72  Pulse: 71 72 65 73  Resp: '16 18 18 18  '$ Temp: 97.9 F (36.6 C) 98.7 F (37.1 C) 98.2 F (36.8 C) 99.1 F (37.3 C)  TempSrc: Oral  Oral Oral  SpO2: 97% 95% 96% 97%  Weight:      Height:        Intake/Output Summary (Last 24 hours) at 06/12/2022 1113 Last data filed at 06/12/2022 0200 Gross per 24 hour  Intake 1130 ml  Output --  Net 1130 ml    Filed Weights   06/07/22 1906 06/08/22 1045  Weight:  74.3 kg 74.3 kg    Examination:  General exam: Appears calm and comfortable  Respiratory system: Clear to auscultation. Respiratory effort normal. Cardiovascular system: S1 & S2 heard, RRR. No JVD, murmurs, rubs, gallops or clicks. No pedal edema. Gastrointestinal system: Abdomen is nondistended, soft and nontender. No organomegaly or masses felt. Normal bowel sounds heard. Central nervous system: Slight sleepy and oriented. No focal neurological deficits. Skin: No rashes, lesions or ulcers.  Psychiatry: Judgement and insight appear normal. Mood  & affect appropriate.   Data Reviewed: I have personally reviewed following labs and imaging studies  CBC: Recent Labs  Lab 06/07/22 1149 06/09/22 0339 06/10/22 0353 06/11/22 0629  WBC 4.7 5.1 6.1 5.4  NEUTROABS 2.5  --   --   --   HGB 12.6 10.9* 10.6* 11.5*  HCT 36.5 31.5* 31.2* 34.7*  MCV 91.5 91.3 92.3 94.8  PLT 209 162 182 175    Basic Metabolic Panel: Recent Labs  Lab 06/07/22 1149 06/09/22 0339 06/10/22 0353 06/11/22 0629  NA 136 134* 134* 137  K 4.0 3.8 4.0 4.1  CL 103 104 103 107  CO2 '25 24 25 26  '$ GLUCOSE 124* 179* 102* 98  BUN '13 13 12 8  '$ CREATININE 0.87 0.87 0.84 0.81  CALCIUM 9.3 8.8* 9.0 9.1    GFR: Estimated Creatinine Clearance: 45.5 mL/min (by C-G formula based on SCr of 0.81 mg/dL). Liver Function Tests: No results for input(s): "AST", "ALT", "ALKPHOS", "BILITOT", "PROT", "ALBUMIN" in the last 168 hours. No results for input(s): "LIPASE", "AMYLASE" in the last 168 hours. No results for input(s): "AMMONIA" in the last 168 hours. Coagulation Profile: Recent Labs  Lab 06/07/22 1149  INR 1.0    Cardiac Enzymes: No results for input(s): "CKTOTAL", "CKMB", "CKMBINDEX", "TROPONINI" in the last 168 hours. BNP (last 3 results) No results for input(s): "PROBNP" in the last 8760 hours. HbA1C: No results for input(s): "HGBA1C" in the last 72 hours. CBG: No results for input(s): "GLUCAP" in the last 168 hours. Lipid Profile: No results for input(s): "CHOL", "HDL", "LDLCALC", "TRIG", "CHOLHDL", "LDLDIRECT" in the last 72 hours. Thyroid Function Tests: No results for input(s): "TSH", "T4TOTAL", "FREET4", "T3FREE", "THYROIDAB" in the last 72 hours. Anemia Panel: No results for input(s): "VITAMINB12", "FOLATE", "FERRITIN", "TIBC", "IRON", "RETICCTPCT" in the last 72 hours. Sepsis Labs: No results for input(s): "PROCALCITON", "LATICACIDVEN" in the last 168 hours.  Recent Results (from the past 240 hour(s))  Surgical pcr screen     Status: Abnormal    Collection Time: 06/07/22  7:09 PM   Specimen: Nasal Mucosa; Nasal Swab  Result Value Ref Range Status   MRSA, PCR NEGATIVE NEGATIVE Final   Staphylococcus aureus POSITIVE (A) NEGATIVE Final    Comment: (NOTE) The Xpert SA Assay (FDA approved for NASAL specimens in patients 57 years of age and older), is one component of a comprehensive surveillance program. It is not intended to diagnose infection nor to guide or monitor treatment. Performed at [facility], [place] [address] [place], [place] [address]      Radiology Studies: No results found.  Scheduled Meds:  aspirin EC  325 mg Oral Q breakfast   Chlorhexidine Gluconate Cloth  6 each Topical Q0600   docusate sodium  100 mg Oral BID   enoxaparin (LOVENOX) injection  40 mg Subcutaneous Q24H   feeding supplement  1 Container Oral BID BM   mupirocin ointment  1 Application Nasal BID   senna  1 tablet Oral BID   Continuous  Infusions:  methocarbamol (ROBAXIN) IV       LOS: 5 days   Darliss Cheney, MD [facility]  06/12/2022, 11:13 AM   *Please note that this is a verbal dictation therefore any spelling or grammatical errors are due to the "[employer]" system interpretation.  Please page via [place] and do not message via secure chat for urgent patient care matters. Secure chat can be used for non urgent patient care matters.  How to contact the [facility] Attending or Consulting provider [place] or covering provider during after hours [place], for this patient?  Check the care team in [facility] and look for a) attending/consulting TRH provider listed and b) the [facility] team listed. Page or secure chat [phone]. Log into [email or web address] and use [place]'s universal password to access. If you do not have the password, please contact the hospital operator. Locate the [facility] provider you are looking for under [facility] and page to a number that you can be directly reached. If you still have difficulty reaching the  provider, please page the [facility] (Director on Call) for the Hospitalists listed on amion for assistance.

## 2022-06-12 NOTE — TOC Progression Note (Signed)
Transition of Care [facility]) - Progression Note   Patient Details  Name: Krystal Moore MRN: [id number] Date of Birth: 08/02/1930  Transition of Care [facility]) CM/SW [place], LCSW Phone Number: 06/12/2022, 4:01 PM  Clinical Narrative: CSW provided patient with bed offers and patient selected [facility]. CSW confirmed bed with Teena in admissions at [facility] Patient can be admitted pending insurance authorization. Initial referral re-faxed to [facility] for review [phone]).   Expected Discharge Plan: Skilled Nursing Facility Barriers to Discharge: SNF Pending bed offer, Insurance Authorization  Expected Discharge Plan and Services Expected Discharge Plan: [place] In-house Referral: Clinical Social Work Post Acute Care Choice: [place] Living arrangements for the past 2 months: Single Family Home            DME Arranged: N/A DME Agency: NA  Readmission Risk Interventions     No data to display

## 2022-06-13 DIAGNOSIS — S72001A Fracture of unspecified part of neck of right femur, initial encounter for closed fracture: Secondary | ICD-10-CM | POA: Diagnosis not present

## 2022-06-13 MED ORDER — LEVOTHYROXINE SODIUM 112 MCG PO TABS
112.0000 ug | ORAL_TABLET | Freq: Every day | ORAL | Status: DC
  Administered 2022-06-14: 112 ug via ORAL
  Filled 2022-06-13: qty 1

## 2022-06-13 NOTE — Progress Notes (Signed)
Physical Therapy Treatment Patient Details Name: Krystal Moore MRN: [id number] DOB: 12/21/1929 Today's Date: 06/13/2022   History of Present Illness Krystal Moore is a 88 y.o. female who presented with an intertrochanteric femur fracture after a fall. Pt underwent Intramedullary fixation, Right femur on 06/07/22.  Pt with hx of Narcolepsy, polyneuropathy, and L shoulder fx    PT Comments    Pt is progressing and motivated to work with PT. Improving activity tolerance this session. Pt presents with pain, LE weakness and balance deficits placing her at risk for falls. Pt will benefit from  SNF for continued rehab post acute.   Recommendations for follow up therapy are one component of a multi-disciplinary discharge planning process, led by the attending physician.  Recommendations may be updated based on patient status, additional functional criteria and insurance authorization.  Follow Up Recommendations  Skilled nursing-short term rehab (<3 hours/day) Can patient physically be transported by private vehicle: Yes   Assistance Recommended at Discharge Frequent or constant Supervision/Assistance  Patient can return home with the following Assistance with cooking/housework;Assist for transportation;Help with stairs or ramp for entrance;A little help with walking and/or transfers;A little help with bathing/dressing/bathroom   Equipment Recommendations  None recommended by PT    Recommendations for Other Services       Precautions / Restrictions Precautions Precautions: Fall Restrictions Weight Bearing Restrictions: No RLE Weight Bearing: Weight bearing as tolerated     Mobility  Bed Mobility               General bed mobility comments: in recliner    Transfers Overall transfer level: Needs assistance Equipment used: Rolling walker (2 wheels) Transfers: Sit to/from Stand Sit to Stand: Min guard           General transfer comment: VCs UE  placement, control of descent    Ambulation/Gait Ambulation/Gait assistance: Min guard Gait Distance (Feet): 40 Feet Assistive device: Rolling walker (2 wheels) Gait Pattern/deviations: Step-to pattern, Decreased step length - right, Decreased step length - left, Decreased stance time - right, Trunk flexed Gait velocity: decr     General Gait Details: Increased time with cues for sequence, posture and position from RW, distance limited by fatigue   Stairs             Wheelchair Mobility    Modified Rankin (Stroke Patients Only)       Balance     Sitting balance-Leahy Scale: Good     Standing balance support: Bilateral upper extremity supported, During functional activity, Reliant on assistive device for balance Standing balance-Leahy Scale: Poor                              Cognition Arousal/Alertness: Awake/alert Behavior During Therapy: WFL for tasks assessed/performed Overall Cognitive Status: Within Functional Limits for tasks assessed                                          Exercises General Exercises - Lower Extremity Ankle Circles/Pumps: AROM, Both, 10 reps Heel Slides: AROM, AAROM, Right, 5 reps Hip ABduction/ADduction: AROM, AAROM, Right, 15 reps    General Comments        Pertinent Vitals/Pain Pain Assessment Pain Assessment: Faces Faces Pain Scale: Hurts a little bit Pain Location: R hip and L rib cage Pain Descriptors / Indicators: Sore Pain Intervention(s): Limited  activity within patient's tolerance, Monitored during session, Repositioned    Home Living                          Prior Function            PT Goals (current goals can now be found in the care plan section) Acute Rehab PT Goals Patient Stated Goal: Regain IND, playing the organ PT Goal Formulation: With patient Time For Goal Achievement: 06/29/22 Potential to Achieve Goals: Good Progress towards PT goals: Progressing toward  goals    Frequency    Min 3X/week      PT Plan Current plan remains appropriate    Co-evaluation              AM-PAC PT "6 Clicks" Mobility   Outcome Measure  Help needed turning from your back to your side while in a flat bed without using bedrails?: A Little Help needed moving from lying on your back to sitting on the side of a flat bed without using bedrails?: A Little Help needed moving to and from a bed to a chair (including a wheelchair)?: A Little Help needed standing up from a chair using your arms (e.g., wheelchair or bedside chair)?: A Little Help needed to walk in hospital room?: A Little Help needed climbing 3-5 steps with a railing? : A Little 6 Click Score: 18    End of Session Equipment Utilized During Treatment: Gait belt Activity Tolerance: Patient tolerated treatment well Patient left: in chair;with call bell/phone within reach;with chair alarm set Nurse Communication: Mobility status PT Visit Diagnosis: Unsteadiness on feet (R26.81);History of falling (Z91.81);Difficulty in walking, not elsewhere classified (R26.2);Pain Pain - Right/Left: Right Pain - part of body: Hip     Time: 6803-2122 PT Time Calculation (min) (ACUTE ONLY): 17 min  Charges:  $Gait Training: 8-22 mins                     Baxter Flattery, PT  Acute Rehab Dept [facility]) [phone]  [facility] Weekend Pager [facility] only)  [phone]  06/13/2022    [facility] 06/13/2022, 5:44 PM

## 2022-06-13 NOTE — Plan of Care (Signed)
Plan of care reviewed and discussed. °

## 2022-06-13 NOTE — TOC Progression Note (Signed)
Transition of Care [facility]) - Progression Note   Patient Details  Name: Krystal Moore MRN: [id number] Date of Birth: 1929/12/15  Transition of Care [facility]) CM/SW [place], LCSW Phone Number: 06/13/2022, 2:28 PM  Clinical Narrative: Insurance authorization for SNF is pending. Patient's pending cert is: [id number]. CSW updated patient regarding pending insurance approval. CSW left voicemail for Loma Boston at [employer] authorization. TOC to follow.  Expected Discharge Plan: Skilled Nursing Facility Barriers to Discharge: SNF Pending bed offer, Insurance Authorization  Expected Discharge Plan and Services Expected Discharge Plan: [place] In-house Referral: Clinical Social Work Post Acute Care Choice: [place] Living arrangements for the past 2 months: Single Family Home          DME Arranged: N/A DME Agency: NA  Readmission Risk Interventions     No data to display

## 2022-06-13 NOTE — Plan of Care (Signed)
Plan of care reviewed and discussed with the patient. 

## 2022-06-13 NOTE — Plan of Care (Signed)
  Problem: Education: Goal: Knowledge of General Education information will improve Description: Including pain rating scale, medication(s)/side effects and non-pharmacologic comfort measures Outcome: Progressing   Problem: Health Behavior/Discharge Planning: Goal: Ability to manage health-related needs will improve Outcome: Progressing   Problem: Clinical Measurements: Goal: Ability to maintain clinical measurements within normal limits will improve Outcome: Progressing Goal: Will remain free from infection Outcome: Progressing Goal: Diagnostic test results will improve Outcome: Progressing Goal: Respiratory complications will improve Outcome: Progressing   Problem: Activity: Goal: Risk for activity intolerance will decrease Outcome: Progressing   Problem: Coping: Goal: Level of anxiety will decrease Outcome: Progressing   Problem: Pain Managment: Goal: General experience of comfort will improve Outcome: Progressing   Problem: Safety: Goal: Ability to remain free from injury will improve Outcome: Progressing   Problem: Skin Integrity: Goal: Risk for impaired skin integrity will decrease Outcome: Progressing   Problem: Activity: Goal: Ability to ambulate and perform ADLs will improve Outcome: Progressing   Problem: Clinical Measurements: Goal: Postoperative complications will be avoided or minimized Outcome: Progressing   Problem: Self-Concept: Goal: Ability to maintain and perform role responsibilities to the fullest extent possible will improve Outcome: Progressing   Problem: Pain Management: Goal: Pain level will decrease Outcome: Progressing

## 2022-06-13 NOTE — Progress Notes (Signed)
PROGRESS NOTE    Krystal Moore  QPR:[id number] DOB: 10-20-1930 DOA: 06/07/2022 PCP: Ladell Pier, MD   Brief Narrative:   Krystal Moore is a 88 y.o. female with medical history significant of hypothyroidism. Presented with right hip pain after fall. She did not hit her head against the ground. She did not suffer LOC.  Her work-up is significant for right hip fracture, underwent surgical repair.  Waiting for placement.  Assessment & Plan:   Principal Problem:   Closed right hip fracture [facility]) Active Problems:   Hypothyroidism   Fall at home, initial encounter  Fall/right hip fracture: Status postsurgical repair by general surgery.  Patient is doing well, pain fairly controlled.  Seen by PT OT and they recommend SNF.  TOC on board.  Per TOC, her insurance is difficult and takes a long time for authorization, waiting for authorization currently.  Acute blood loss anemia: Hemoglobin dropped from 12.6-10.9 > 10.6> 11.5, no indication of transfusion.  Likely blood loss from surgery.  Monitor closely.  Acquired hypothyroidism: Continue Synthroid.  DVT prophylaxis: enoxaparin (LOVENOX) injection 40 mg Start: 06/10/22 1000 SCDs Start: 06/08/22 1511   Code Status: Full Code  Family Communication:  None present at bedside.  Plan of care discussed with patient in length and he/she verbalized understanding and agreed with it.  Status is: Inpatient Remains inpatient appropriate because: Medically stable, waiting for insurance authorization/discharge to SNF.   Estimated body mass index is 29.02 kg/m as calculated from the following:   Height as of this encounter: '5\' 3"'$  (1.6 m).   Weight as of this encounter: 74.3 kg.    Nutritional Assessment: Body mass index is 29.02 kg/m.Marland Kitchen Seen by dietician.  I agree with the assessment and plan as outlined below: Nutrition Status: Nutrition Problem: Increased nutrient needs Etiology: hip fracture, post-op  healing Signs/Symptoms: estimated needs Interventions: Boost Breeze  . Skin Assessment: I have examined the patient's skin and I agree with the wound assessment as performed by the wound care RN as outlined below:    Consultants:  Orthopedics  Procedures:  As above  Antimicrobials:  Anti-infectives (From admission, onward)    Start     Dose/Rate Route Frequency Ordered Stop   06/08/22 1830  ceFAZolin (ANCEF) IVPB 2g/100 mL premix        2 g 200 mL/hr over 30 Minutes Intravenous Every 6 hours 06/08/22 1511 06/09/22 0032   06/08/22 0600  ceFAZolin (ANCEF) IVPB 2g/100 mL premix        2 g 200 mL/hr over 30 Minutes Intravenous On call to O.R. 06/07/22 1647 06/08/22 1250         Subjective:  Seen and examined.  Feels much better today.  Had a very good sleep last night.  Objective: Vitals:   06/12/22 1337 06/12/22 2107 06/13/22 0508 06/13/22 1406  BP: (!) 155/67 (!) 126/59 (!) 141/46 109/67  Pulse: 60 64 62 (!) 59  Resp: '16 17 17 18  '$ Temp: 98.3 F (36.8 C) 98.2 F (36.8 C) 98 F (36.7 C) (!) 97.5 F (36.4 C)  TempSrc: Oral Oral Oral Oral  SpO2: 100% 98% 96% 96%  Weight:      Height:        Intake/Output Summary (Last 24 hours) at 06/13/2022 1447 Last data filed at 06/12/2022 1802 Gross per 24 hour  Intake 500 ml  Output --  Net 500 ml    Filed Weights   06/07/22 1906 06/08/22 1045  Weight: 74.3 kg 74.3 kg  Examination:  General exam: Appears calm and comfortable  Respiratory system: Clear to auscultation. Respiratory effort normal. Cardiovascular system: S1 & S2 heard, RRR. No JVD, murmurs, rubs, gallops or clicks. No pedal edema. Gastrointestinal system: Abdomen is nondistended, soft and nontender. No organomegaly or masses felt. Normal bowel sounds heard. Central nervous system: Alert and oriented. No focal neurological deficits. Skin: No rashes, lesions or ulcers.  Psychiatry: Judgement and insight appear normal. Mood & affect appropriate.    Data Reviewed: I have personally reviewed following labs and imaging studies  CBC: Recent Labs  Lab 06/07/22 1149 06/09/22 0339 06/10/22 0353 06/11/22 0629  WBC 4.7 5.1 6.1 5.4  NEUTROABS 2.5  --   --   --   HGB 12.6 10.9* 10.6* 11.5*  HCT 36.5 31.5* 31.2* 34.7*  MCV 91.5 91.3 92.3 94.8  PLT 209 162 182 979    Basic Metabolic Panel: Recent Labs  Lab 06/07/22 1149 06/09/22 0339 06/10/22 0353 06/11/22 0629  NA 136 134* 134* 137  K 4.0 3.8 4.0 4.1  CL 103 104 103 107  CO2 '25 24 25 26  '$ GLUCOSE 124* 179* 102* 98  BUN '13 13 12 8  '$ CREATININE 0.87 0.87 0.84 0.81  CALCIUM 9.3 8.8* 9.0 9.1    GFR: Estimated Creatinine Clearance: 45.5 mL/min (by C-G formula based on SCr of 0.81 mg/dL). Liver Function Tests: No results for input(s): "AST", "ALT", "ALKPHOS", "BILITOT", "PROT", "ALBUMIN" in the last 168 hours. No results for input(s): "LIPASE", "AMYLASE" in the last 168 hours. No results for input(s): "AMMONIA" in the last 168 hours. Coagulation Profile: Recent Labs  Lab 06/07/22 1149  INR 1.0    Cardiac Enzymes: No results for input(s): "CKTOTAL", "CKMB", "CKMBINDEX", "TROPONINI" in the last 168 hours. BNP (last 3 results) No results for input(s): "PROBNP" in the last 8760 hours. HbA1C: No results for input(s): "HGBA1C" in the last 72 hours. CBG: No results for input(s): "GLUCAP" in the last 168 hours. Lipid Profile: No results for input(s): "CHOL", "HDL", "LDLCALC", "TRIG", "CHOLHDL", "LDLDIRECT" in the last 72 hours. Thyroid Function Tests: No results for input(s): "TSH", "T4TOTAL", "FREET4", "T3FREE", "THYROIDAB" in the last 72 hours. Anemia Panel: No results for input(s): "VITAMINB12", "FOLATE", "FERRITIN", "TIBC", "IRON", "RETICCTPCT" in the last 72 hours. Sepsis Labs: No results for input(s): "PROCALCITON", "LATICACIDVEN" in the last 168 hours.  Recent Results (from the past 240 hour(s))  Surgical pcr screen     Status: Abnormal   Collection Time:  06/07/22  7:09 PM   Specimen: Nasal Mucosa; Nasal Swab  Result Value Ref Range Status   MRSA, PCR NEGATIVE NEGATIVE Final   Staphylococcus aureus POSITIVE (A) NEGATIVE Final    Comment: (NOTE) The Xpert SA Assay (FDA approved for NASAL specimens in patients 65 years of age and older), is one component of a comprehensive surveillance program. It is not intended to diagnose infection nor to guide or monitor treatment. Performed at [facility], [place] [address], [place], [place] [address]      Radiology Studies: No results found.  Scheduled Meds:  aspirin EC  325 mg Oral Q breakfast   docusate sodium  100 mg Oral BID   enoxaparin (LOVENOX) injection  40 mg Subcutaneous Q24H   feeding supplement  1 Container Oral BID BM   [START ON 06/14/2022] levothyroxine  112 mcg Oral Q0600   senna  1 tablet Oral BID   Continuous Infusions:  methocarbamol (ROBAXIN) IV       LOS: 6 days  Darliss Cheney, MD [facility]  06/13/2022, 2:47 PM   *Please note that this is a verbal dictation therefore any spelling or grammatical errors are due to the "[place]" system interpretation.  Please page via [place] and do not message via secure chat for urgent patient care matters. Secure chat can be used for non urgent patient care matters.  How to contact the [facility] Attending or Consulting provider [place] or covering provider during after hours [place], for this patient?  Check the care team in [facility] and look for a) attending/consulting [facility] provider listed and b) the [facility] team listed. Page or secure chat [phone]. Log into [email or web address] and use [place] universal password to access. If you do not have the password, please contact the hospital operator. Locate the [facility] provider you are looking for under [facility] and page to a number that you can be directly reached. If you still have difficulty reaching the provider, please page the [facility] (Director on Call) for the  Hospitalists listed on amion for assistance.

## 2022-06-14 ENCOUNTER — Encounter (HOSPITAL_COMMUNITY): Payer: Self-pay | Admitting: Orthopedic Surgery

## 2022-06-14 DIAGNOSIS — R279 Unspecified lack of coordination: Secondary | ICD-10-CM | POA: Diagnosis not present

## 2022-06-14 DIAGNOSIS — E440 Moderate protein-calorie malnutrition: Secondary | ICD-10-CM | POA: Diagnosis not present

## 2022-06-14 DIAGNOSIS — G47429 Narcolepsy in conditions classified elsewhere without cataplexy: Secondary | ICD-10-CM | POA: Diagnosis not present

## 2022-06-14 DIAGNOSIS — S72001A Fracture of unspecified part of neck of right femur, initial encounter for closed fracture: Secondary | ICD-10-CM | POA: Diagnosis not present

## 2022-06-14 DIAGNOSIS — W19XXXA Unspecified fall, initial encounter: Secondary | ICD-10-CM | POA: Diagnosis not present

## 2022-06-14 DIAGNOSIS — R41841 Cognitive communication deficit: Secondary | ICD-10-CM | POA: Diagnosis not present

## 2022-06-14 DIAGNOSIS — R296 Repeated falls: Secondary | ICD-10-CM | POA: Diagnosis not present

## 2022-06-14 DIAGNOSIS — S82142G Displaced bicondylar fracture of left tibia, subsequent encounter for closed fracture with delayed healing: Secondary | ICD-10-CM | POA: Diagnosis not present

## 2022-06-14 DIAGNOSIS — E119 Type 2 diabetes mellitus without complications: Secondary | ICD-10-CM | POA: Diagnosis not present

## 2022-06-14 DIAGNOSIS — M6281 Muscle weakness (generalized): Secondary | ICD-10-CM | POA: Diagnosis not present

## 2022-06-14 DIAGNOSIS — S92151D Displaced avulsion fracture (chip fracture) of right talus, subsequent encounter for fracture with routine healing: Secondary | ICD-10-CM | POA: Diagnosis not present

## 2022-06-14 DIAGNOSIS — R2689 Other abnormalities of gait and mobility: Secondary | ICD-10-CM | POA: Diagnosis not present

## 2022-06-14 DIAGNOSIS — S92151A Displaced avulsion fracture (chip fracture) of right talus, initial encounter for closed fracture: Secondary | ICD-10-CM | POA: Diagnosis not present

## 2022-06-14 DIAGNOSIS — Z743 Need for continuous supervision: Secondary | ICD-10-CM | POA: Diagnosis not present

## 2022-06-14 DIAGNOSIS — Z4789 Encounter for other orthopedic aftercare: Secondary | ICD-10-CM | POA: Diagnosis not present

## 2022-06-14 DIAGNOSIS — I4891 Unspecified atrial fibrillation: Secondary | ICD-10-CM | POA: Diagnosis not present

## 2022-06-14 DIAGNOSIS — Z7401 Bed confinement status: Secondary | ICD-10-CM | POA: Diagnosis not present

## 2022-06-14 DIAGNOSIS — I609 Nontraumatic subarachnoid hemorrhage, unspecified: Secondary | ICD-10-CM | POA: Diagnosis not present

## 2022-06-14 DIAGNOSIS — R1310 Dysphagia, unspecified: Secondary | ICD-10-CM | POA: Diagnosis not present

## 2022-06-14 DIAGNOSIS — E039 Hypothyroidism, unspecified: Secondary | ICD-10-CM | POA: Diagnosis not present

## 2022-06-14 DIAGNOSIS — J9601 Acute respiratory failure with hypoxia: Secondary | ICD-10-CM | POA: Diagnosis not present

## 2022-06-14 DIAGNOSIS — R262 Difficulty in walking, not elsewhere classified: Secondary | ICD-10-CM | POA: Diagnosis not present

## 2022-06-14 DIAGNOSIS — S82143A Displaced bicondylar fracture of unspecified tibia, initial encounter for closed fracture: Secondary | ICD-10-CM | POA: Diagnosis not present

## 2022-06-14 DIAGNOSIS — R488 Other symbolic dysfunctions: Secondary | ICD-10-CM | POA: Diagnosis not present

## 2022-06-14 DIAGNOSIS — S72009A Fracture of unspecified part of neck of unspecified femur, initial encounter for closed fracture: Secondary | ICD-10-CM | POA: Diagnosis not present

## 2022-06-14 DIAGNOSIS — T07XXXA Unspecified multiple injuries, initial encounter: Secondary | ICD-10-CM | POA: Diagnosis not present

## 2022-06-14 DIAGNOSIS — G47419 Narcolepsy without cataplexy: Secondary | ICD-10-CM | POA: Diagnosis not present

## 2022-06-14 DIAGNOSIS — M48061 Spinal stenosis, lumbar region without neurogenic claudication: Secondary | ICD-10-CM | POA: Diagnosis not present

## 2022-06-14 DIAGNOSIS — E669 Obesity, unspecified: Secondary | ICD-10-CM | POA: Diagnosis not present

## 2022-06-14 DIAGNOSIS — R52 Pain, unspecified: Secondary | ICD-10-CM | POA: Diagnosis not present

## 2022-06-14 DIAGNOSIS — S4292XS Fracture of left shoulder girdle, part unspecified, sequela: Secondary | ICD-10-CM | POA: Diagnosis not present

## 2022-06-14 MED ORDER — LIDOCAINE 5 % EX PTCH: 1.0000 | MEDICATED_PATCH | Freq: Two times a day (BID) | CUTANEOUS | 0 refills | Status: AC

## 2022-06-14 MED ORDER — SENNA 8.6 MG PO TABS: 1.0000 | ORAL_TABLET | Freq: Two times a day (BID) | ORAL | 0 refills | Status: DC

## 2022-06-14 NOTE — Plan of Care (Signed)
  Problem: Education: Goal: Knowledge of General Education information will improve Description: Including pain rating scale, medication(s)/side effects and non-pharmacologic comfort measures Outcome: Progressing   Problem: Coping: Goal: Level of anxiety will decrease Outcome: Progressing   Problem: Safety: Goal: Ability to remain free from injury will improve Outcome: Progressing   

## 2022-06-14 NOTE — TOC Transition Note (Signed)
Transition of Care [facility]) - CM/SW Discharge Note  Patient Details  Name: Talene Glastetter MRN: [id number] Date of Birth: 06/24/1930  Transition of [facility]) CM/SW Contact:  Sherie Don, LCSW Phone Number: 06/14/2022, 12:24 PM  Clinical Narrative: CSW notified by CMA that patient's insurance has approved her for SNF for sub level 1 for 8/22-8/27. The next review is due 8/28 and the reviewer is Rondel Baton (phone: [phone], fax: [phone]). Patient will go to room 155A and the number for report is [phone]. Discharge summary, discharge orders, and SNF transfer report faxed to facility in hub.  Patient notified of insurance approval and of discharge today. Medical necessity form done; PTAR scheduled. Discharge packet completed. RN updated. TOC signing off.  Final next level of care: Skilled Nursing Facility Barriers to Discharge: Barriers Resolved  Patient Goals and CMS Choice Patient states their goals for this hospitalization and ongoing recovery are:: Go to short-term rehab CMS Medicare.gov Compare Post Acute Care list provided to:: Patient Choice offered to / list presented to : Patient  Discharge Placement PASRR number recieved: 06/09/22       Patient chooses bed at: Other - please specify in the comment section below: [facility]) Patient to be transferred to facility by: PTAR Patient and family notified of of transfer: 06/14/22  Discharge Plan and Services In-house Referral: Clinical Social Work Post Acute Care Choice: [place]          DME Arranged: N/A DME Agency: NA  Readmission Risk Interventions     No data to display

## 2022-06-14 NOTE — Progress Notes (Signed)
Attempted to call report to facility, room 155A x4. Each time placed on hold no answer.

## 2022-06-14 NOTE — Discharge Summary (Signed)
Physician Discharge Summary  Krystal Moore WGY:[id number] DOB: June 15, 1930 DOA: 06/07/2022  PCP: Ladell Pier, MD  Admit date: 06/07/2022 Discharge date: 06/14/2022  Admitted From: Home Disposition:  SNF  Discharge Condition:Stable CODE STATUS:FULL Diet recommendation: Regular   Brief/Interim Summary: Krystal Moore is a 88 y.o. female with medical history significant of hypothyroidism. She presented with right hip pain after fall. Her work-up revealed right hip fracture, underwent surgical repair.  PT/OT recommended skilled nursing facility on discharge.  Medically stable for discharge.  Following problems were addressed during her hospitalization:  Fall/right hip fracture: Status postsurgical repair by Orthopedics. Patient is doing well, pain fairly controlled.  Seen by PT ,OT and they recommend SNF.She should follow-up with orthopedics in 2 weeks.  Continue aspirin for DVT prophylaxis.   Acute blood loss anemia: Hemoglobin dropped from 12.6-10.9 > 10.6> 11.5, no indication of transfusion.  Likely blood loss from surgery.  Stable  Acquired hypothyroidism: Continue Synthroid.   Discharge Diagnoses:  Principal Problem:   Closed right hip fracture [facility]) Active Problems:   Hypothyroidism   Fall at home, initial encounter    Discharge Instructions  Discharge Instructions     Diet general   Complete by: As directed    Discharge instructions   Complete by: As directed    1)Please take prescribed medications as instructed 2)Follow up with orthopedics in 2 weeks.  Name and number the provider has been attached   Increase activity slowly   Complete by: As directed    No wound care   Complete by: As directed       Allergies as of 06/14/2022       Reactions   Elemental Sulfur Shortness Of Breath, Swelling   Milk-related Compounds Nausea And Vomiting   Poison Ivy Extract [poison Ivy Extract] Itching, Rash, Other (See Comments)   Muscle damage     Tobacco [tobacco] Anaphylaxis, Swelling   Allergic to cigarette smoke   Cymbalta [duloxetine Hcl] Diarrhea   Orange Juice [orange Oil] Nausea And Vomiting        Medication List     STOP taking these medications    amLODipine 5 MG tablet Commonly known as: NORVASC   furosemide 20 MG tablet Commonly known as: LASIX   hydrochlorothiazide 12.5 MG tablet Commonly known as: HYDRODIURIL   tiZANidine 4 MG tablet Commonly known as: ZANAFLEX       TAKE these medications    aspirin 81 MG chewable tablet Commonly known as: Aspirin Childrens Chew 1 tablet (81 mg total) by mouth 2 (two) times daily with a meal.   calcium carbonate 1250 (500 Ca) MG tablet Commonly known as: OS-CAL - dosed in mg of elemental calcium Take 1 tablet by mouth daily.   cyanocobalamin 1000 MCG tablet Commonly known as: VITAMIN B12 Take 3,000 mcg by mouth daily.   HYDROcodone-acetaminophen 10-325 MG tablet Commonly known as: Norco Take 0.5 tablets by mouth every 4 (four) hours as needed for up to 7 days.   Krill Oil 1000 MG Caps Take 1,000 mg by mouth daily.   levothyroxine 112 MCG tablet Commonly known as: SYNTHROID TAKE 1 TABLET BY MOUTH EVERY DAY BEFORE BREAKFAST What changed:  how much to take how to take this when to take this additional instructions   lidocaine 5 % Commonly known as: Lidoderm Place 1 patch onto the skin every 12 (twelve) hours. Remove & Discard patch within 12 hours or as directed by MD Apply on the painful left lower back  senna 8.6 MG Tabs tablet Commonly known as: SENOKOT Take 1 tablet (8.6 mg total) by mouth 2 (two) times daily.   Vitamin D3 125 MCG (5000 UT) Caps Take 1 capsule by mouth daily.        Contact information for follow-up providers     Rod Can, MD Follow up in 2 week(s).   Specialty: Orthopedic Surgery Contact information: [address] [place] [address] [phone]         Ladell Pier, MD  Follow up in 1 week(s).   Specialty: Internal Medicine Contact information: [address] [place] [address] [phone]              Contact information for after-discharge care     Destination     [facility] Preferred SNF .   Service: Skilled Nursing Contact information: [place] [address] [phone]                    Allergies  Allergen Reactions   Elemental Sulfur Shortness Of Breath and Swelling   Milk-Related Compounds Nausea And Vomiting   Poison Ivy Extract [Poison Ivy Extract] Itching, Rash and Other (See Comments)    Muscle damage    Tobacco [Tobacco] Anaphylaxis and Swelling    Allergic to cigarette smoke   Cymbalta [Duloxetine Hcl] Diarrhea   Orange Juice [Orange Oil] Nausea And Vomiting    Consultations: Orthopedics   Procedures/Studies: Pelvis Portable  Result Date: 06/08/2022 CLINICAL DATA:  Post RIGHT femoral nailing EXAM: PORTABLE PELVIS 1-2 VIEWS COMPARISON:  Portable exam 1419 hours without recent priors for comparison FINDINGS: IM nail with compression screw and distal locking screw identified in RIGHT femur post ORIF. Question underlying nondisplaced intertrochanteric fracture RIGHT femur. Bones appear demineralized. Hip and SI joint spaces preserved. No fracture or dislocation. IMPRESSION: Post ORIF of proximal RIGHT femur. Electronically Signed   By: Lavonia Dana M.D.   On: 06/08/2022 14:39   DG C-Arm 1-60 Min-No Report  Result Date: 06/08/2022 CLINICAL DATA:  Surgical internal fixation of right femur fracture. EXAM: DG HIP (WITH OR WITHOUT PELVIS) 1V RIGHT; DG C-ARM 1-60 MIN-NO REPORT Radiation exposure index: 6.7608 mGy. COMPARISON:  June 07, 2022. FINDINGS: Two intraoperative fluoroscopic images were obtained of the right hip. These demonstrate the patient to be status post surgical internal fixation of proximal right femoral fracture. IMPRESSION:  Fluoroscopic guidance provided during surgical internal fixation of proximal right femoral fracture. Electronically Signed   By: Marijo Conception M.D.   On: 06/08/2022 13:24   DG HIP UNILAT WITH PELVIS 1V RIGHT  Result Date: 06/08/2022 CLINICAL DATA:  Surgical internal fixation of right femur fracture. EXAM: DG HIP (WITH OR WITHOUT PELVIS) 1V RIGHT; DG C-ARM 1-60 MIN-NO REPORT Radiation exposure index: 6.7608 mGy. COMPARISON:  June 07, 2022. FINDINGS: Two intraoperative fluoroscopic images were obtained of the right hip. These demonstrate the patient to be status post surgical internal fixation of proximal right femoral fracture. IMPRESSION: Fluoroscopic guidance provided during surgical internal fixation of proximal right femoral fracture. Electronically Signed   By: Marijo Conception M.D.   On: 06/08/2022 13:24   DG Knee Right Port  Result Date: 06/07/2022 CLINICAL DATA:  Mechanical fall on right hip with hip fracture EXAM: PORTABLE RIGHT KNEE - 1-2 VIEW COMPARISON:  Radiographs 06/07/2022 FINDINGS: No acute fracture or dislocation of the right knee. Demineralization. Mild degenerative arthritis. No knee joint effusion. IMPRESSION: No acute fracture or  dislocation. Electronically Signed   By: Placido Sou M.D.   On: 06/07/2022 17:29   DG Chest Port 1 View  Result Date: 06/07/2022 CLINICAL DATA:  Preop imaging EXAM: PORTABLE CHEST 1 VIEW COMPARISON:  Chest x-ray dated July 15, 2014 FINDINGS: Cardiac and mediastinal contours unchanged. Large hiatal hernia, increased in size when compared with the prior exam. Mild left basilar opacity, likely due to atelectasis. No large pleural effusion or evidence of pneumothorax. Degenerative changes of the left-greater-than-right shoulders. IMPRESSION: 1.  Mild left basilar opacity, likely due to atelectasis. 2. Large hiatal hernia, increased in size when compared with the prior exam. Electronically Signed   By: Yetta Glassman M.D.   On: 06/07/2022 13:05    DG Hip Unilat With Pelvis 2-3 Views Right  Result Date: 06/07/2022 CLINICAL DATA:  Fall EXAM: DG HIP (WITH OR WITHOUT PELVIS) 2-3V RIGHT COMPARISON:  Hip radiographs 07/27/2021 FINDINGS: There is an acute mildly proximally displaced right intertrochanteric fracture. Femoroacetabular alignment is normal. The SI joints and symphysis pubis are intact. No acute fracture is seen on the left. A calcified uterine fibroid is again noted. IMPRESSION: Acute mildly proximally displaced right intertrochanteric fracture. Electronically Signed   By: Valetta Mole M.D.   On: 06/07/2022 12:32      Subjective: Patient seen and examined at the bedside this morning.  Hemodynamically stable for discharge.  Discharge Exam: Vitals:   06/13/22 2044 06/14/22 0503  BP: 123/63 (!) 143/50  Pulse: 63 63  Resp: 17 17  Temp: 98.2 F (36.8 C) 98.4 F (36.9 C)  SpO2: 95% 98%   Vitals:   06/13/22 0508 06/13/22 1406 06/13/22 2044 06/14/22 0503  BP: (!) 141/46 109/67 123/63 (!) 143/50  Pulse: 62 (!) 59 63 63  Resp: '17 18 17 17  '$ Temp: 98 F (36.7 C) (!) 97.5 F (36.4 C) 98.2 F (36.8 C) 98.4 F (36.9 C)  TempSrc: Oral Oral Oral Oral  SpO2: 96% 96% 95% 98%  Weight:      Height:        General: Pt is alert, awake, not in acute distress Cardiovascular: RRR, S1/S2 +, no rubs, no gallops Respiratory: CTA bilaterally, no wheezing, no rhonchi Abdominal: Soft, NT, ND, bowel sounds + Extremities: no edema, no cyanosis    The results of significant diagnostics from this hospitalization (including imaging, microbiology, ancillary and laboratory) are listed below for reference.     Microbiology: Recent Results (from the past 240 hour(s))  Surgical pcr screen     Status: Abnormal   Collection Time: 06/07/22  7:09 PM   Specimen: Nasal Mucosa; Nasal Swab  Result Value Ref Range Status   MRSA, PCR NEGATIVE NEGATIVE Final   Staphylococcus aureus POSITIVE (A) NEGATIVE Final    Comment: (NOTE) The Xpert SA Assay  (FDA approved for NASAL specimens in patients 44 years of age and older), is one component of a comprehensive surveillance program. It is not intended to diagnose infection nor to guide or monitor treatment. Performed at [facility], [place] [address], [place], [place] [address]      Labs: BNP (last 3 results) No results for input(s): "BNP" in the last 8760 hours. Basic Metabolic Panel: Recent Labs  Lab 06/07/22 1149 06/09/22 0339 06/10/22 0353 06/11/22 0629  NA 136 134* 134* 137  K 4.0 3.8 4.0 4.1  CL 103 104 103 107  CO2 '25 24 25 26  '$ GLUCOSE 124* 179* 102* 98  BUN '13 13 12 8  '$ CREATININE 0.87 0.87  0.84 0.81  CALCIUM 9.3 8.8* 9.0 9.1   Liver Function Tests: No results for input(s): "AST", "ALT", "ALKPHOS", "BILITOT", "PROT", "ALBUMIN" in the last 168 hours. No results for input(s): "LIPASE", "AMYLASE" in the last 168 hours. No results for input(s): "AMMONIA" in the last 168 hours. CBC: Recent Labs  Lab 06/07/22 1149 06/09/22 0339 06/10/22 0353 06/11/22 0629  WBC 4.7 5.1 6.1 5.4  NEUTROABS 2.5  --   --   --   HGB 12.6 10.9* 10.6* 11.5*  HCT 36.5 31.5* 31.2* 34.7*  MCV 91.5 91.3 92.3 94.8  PLT 209 162 182 210   Cardiac Enzymes: No results for input(s): "CKTOTAL", "CKMB", "CKMBINDEX", "TROPONINI" in the last 168 hours. BNP: Invalid input(s): "POCBNP" CBG: No results for input(s): "GLUCAP" in the last 168 hours. D-Dimer No results for input(s): "DDIMER" in the last 72 hours. Hgb A1c No results for input(s): "HGBA1C" in the last 72 hours. Lipid Profile No results for input(s): "CHOL", "HDL", "LDLCALC", "TRIG", "CHOLHDL", "LDLDIRECT" in the last 72 hours. Thyroid function studies No results for input(s): "TSH", "T4TOTAL", "T3FREE", "THYROIDAB" in the last 72 hours.  Invalid input(s): "FREET3" Anemia work up No results for input(s): "VITAMINB12", "FOLATE", "FERRITIN", "TIBC", "IRON", "RETICCTPCT" in the last 72 hours. Urinalysis    Component  Value Date/Time   COLORURINE YELLOW 07/15/2014 [place] 07/15/2014 1551   LABSPEC 1.019 07/15/2014 1551   PHURINE 5.5 07/15/2014 1551   GLUCOSEU NEGATIVE 07/15/2014 1551   HGBUR NEGATIVE 07/15/2014 1551   BILIRUBINUR NEGATIVE 07/15/2014 1551   KETONESUR NEGATIVE 07/15/2014 1551   PROTEINUR NEGATIVE 07/15/2014 1551   UROBILINOGEN 0.2 07/15/2014 1551   NITRITE NEGATIVE 07/15/2014 1551   LEUKOCYTESUR TRACE (A) 07/15/2014 1551   Sepsis Labs Recent Labs  Lab 06/07/22 1149 06/09/22 0339 06/10/22 0353 06/11/22 0629  WBC 4.7 5.1 6.1 5.4   Microbiology Recent Results (from the past 240 hour(s))  Surgical pcr screen     Status: Abnormal   Collection Time: 06/07/22  7:09 PM   Specimen: Nasal Mucosa; Nasal Swab  Result Value Ref Range Status   MRSA, PCR NEGATIVE NEGATIVE Final   Staphylococcus aureus POSITIVE (A) NEGATIVE Final    Comment: (NOTE) The Xpert SA Assay (FDA approved for NASAL specimens in patients 45 years of age and older), is one component of a comprehensive surveillance program. It is not intended to diagnose infection nor to guide or monitor treatment. Performed at [facility], [place] [address] [place], [place] [address]     Please note: You were cared for by a hospitalist during your hospital stay. Once you are discharged, your primary care physician will handle any further medical issues. Please note that NO REFILLS for any discharge medications will be authorized once you are discharged, as it is imperative that you return to your primary care physician (or establish a relationship with a primary care physician if you do not have one) for your post hospital discharge needs so that they can reassess your need for medications and monitor your lab values.    Time coordinating discharge: 40 minutes  SIGNED:   Shelly Coss, MD  [facility] 06/14/2022, 11:07 AM Pager [phone]  If 7PM-7AM, please contact  night-coverage [email or web address] Password [email or web address]

## 2022-06-14 NOTE — Progress Notes (Signed)
Occupational Therapy Treatment Patient Details Name: Krystal Moore MRN: [id number] DOB: 16-Jan-1930 Today's Date: 06/14/2022   History of present illness Krystal Moore is a 88 y.o. female who presented with an intertrochanteric femur fracture after a fall. Pt underwent Intramedullary fixation, Right femur on 06/07/22.  Pt with hx of Narcolepsy, polyneuropathy, and L shoulder fx   OT comments  Patient was able to engage in standing at sink with less physical assistance on this date with RW and no LOB. Patient engaged in theraputic activity to increase functional activity tolerance. Patient would continue to benefit from skilled OT services at this time while admitted and after d/c to address noted deficits in order to improve overall safety and independence in ADLs. Patient's discharge plan remains appropriate at this time. OT will continue to follow acutely.     Recommendations for follow up therapy are one component of a multi-disciplinary discharge planning process, led by the attending physician.  Recommendations may be updated based on patient status, additional functional criteria and insurance authorization.    Follow Up Recommendations  Skilled nursing-short term rehab (<3 hours/day)    Assistance Recommended at Discharge Frequent or constant Supervision/Assistance  Patient can return home with the following  A lot of help with bathing/dressing/bathroom;Direct supervision/assist for financial management;Assist for transportation;Help with stairs or ramp for entrance;Assistance with cooking/housework;A lot of help with walking and/or transfers   Equipment Recommendations  BSC/3in1    Recommendations for Other Services      Precautions / Restrictions Precautions Precautions: Fall Restrictions Weight Bearing Restrictions: No RLE Weight Bearing: Weight bearing as tolerated       Mobility Bed Mobility               General bed mobility comments: in  recliner    Transfers                         Balance                                           ADL either performed or assessed with clinical judgement   ADL Overall ADL's : Needs assistance/impaired     Grooming: Wash/dry face;Wash/dry hands;Supervision/safety;Standing Grooming Details (indicate cue type and reason): at sink in bathroom.                 Toilet Transfer: Cueing for sequencing;Cueing for safety;Rolling walker (2 wheels);Min guard;Ambulation Toilet Transfer Details (indicate cue type and reason): with increased time and education on importance of taking gait belt off RLE prior to attempting to move Toileting- Clothing Manipulation and Hygiene: Minimal assistance;Sit to/from stand Toileting - Clothing Manipulation Details (indicate cue type and reason): simulated            Extremity/Trunk Assessment              Vision       Perception     Praxis      Cognition Arousal/Alertness: Awake/alert Behavior During Therapy: WFL for tasks assessed/performed Overall Cognitive Status: Within Functional Limits for tasks assessed                                          Exercises Other Exercises Other Exercises: patient participated in sit to stand x15 reps  to increase functional acitivty tolerance.    Shoulder Instructions       General Comments      Pertinent Vitals/ Pain          Home Living                                          Prior Functioning/Environment              Frequency  Min 2X/week        Progress Toward Goals  OT Goals(current goals can now be found in the care plan section)  Progress towards OT goals: Progressing toward goals     Plan Discharge plan remains appropriate    Co-evaluation                 AM-PAC OT "6 Clicks" Daily Activity     Outcome Measure   Help from another person eating meals?: A Little Help from another  person taking care of personal grooming?: A Little Help from another person toileting, which includes using toliet, bedpan, or urinal?: A Lot Help from another person bathing (including washing, rinsing, drying)?: A Lot Help from another person to put on and taking off regular upper body clothing?: A Little Help from another person to put on and taking off regular lower body clothing?: A Lot 6 Click Score: 15    End of Session Equipment Utilized During Treatment: Gait belt;Rolling walker (2 wheels)  OT Visit Diagnosis: Unsteadiness on feet (R26.81);Other symptoms and signs involving cognitive function;Pain Pain - Right/Left: Right Pain - part of body: Leg   Activity Tolerance Patient limited by pain   Patient Left with call bell/phone within reach;with chair alarm set;in chair   Nurse Communication Mobility status        Time: 4008-6761 OT Time Calculation (min): 24 min  Charges: OT General Charges $OT Visit: 1 Visit OT Treatments $Self Care/Home Management : 23-37 mins  Jackelyn Poling OTR/L, MS Acute Rehabilitation Department Office# [phone]   Marcellina Millin 06/14/2022, 12:06 PM

## 2022-06-14 NOTE — Progress Notes (Signed)
Physical Therapy Treatment Patient Details Name: Krystal Moore MRN: [id number] DOB: 1930/07/14 Today's Date: 06/14/2022   History of Present Illness Krystal Moore is a 88 y.o. female who presented with an intertrochanteric femur fracture after a fall. Pt underwent Intramedullary fixation, Right femur on 06/07/22.  Pt with hx of Narcolepsy, polyneuropathy, and L shoulder fx    PT Comments    Pt is progressing toward PT goals. Incr activity tolerance and incr gait distance. Improving step length with good stability. Fatigued after distance, able to recover and participate in exercises after brief rest. Continue to recommend SNF for continued rehab, pt is motivated to get back home   Recommendations for follow up therapy are one component of a multi-disciplinary discharge planning process, led by the attending physician.  Recommendations may be updated based on patient status, additional functional criteria and insurance authorization.  Follow Up Recommendations  Skilled nursing-short term rehab (<3 hours/day) Can patient physically be transported by private vehicle: Yes   Assistance Recommended at Discharge Frequent or constant Supervision/Assistance  Patient can return home with the following Assistance with cooking/housework;Assist for transportation;Help with stairs or ramp for entrance;A little help with walking and/or transfers;A little help with bathing/dressing/bathroom   Equipment Recommendations  None recommended by PT    Recommendations for Other Services       Precautions / Restrictions Precautions Precautions: Fall Restrictions Weight Bearing Restrictions: No RLE Weight Bearing: Weight bearing as tolerated     Mobility  Bed Mobility               General bed mobility comments: in recliner    Transfers Overall transfer level: Needs assistance Equipment used: Rolling walker (2 wheels) Transfers: Sit to/from Stand Sit to Stand: Min guard,  Supervision           General transfer comment: VCs UE placement, control of descent    Ambulation/Gait Ambulation/Gait assistance: Min guard Gait Distance (Feet): 80 Feet Assistive device: Rolling walker (2 wheels) Gait Pattern/deviations: Step-to pattern, Decreased step length - right, Decreased step length - left, Decreased stance time - right, Trunk flexed Gait velocity: decr     General Gait Details: Increased time with cues for sequence, posture/trunk extension and position from RW.   Stairs             Wheelchair Mobility    Modified Rankin (Stroke Patients Only)       Balance     Sitting balance-Leahy Scale: Good     Standing balance support: Bilateral upper extremity supported, During functional activity, Reliant on assistive device for balance Standing balance-Leahy Scale: Poor                              Cognition Arousal/Alertness: Awake/alert Behavior During Therapy: WFL for tasks assessed/performed Overall Cognitive Status: Within Functional Limits for tasks assessed                                          Exercises General Exercises - Lower Extremity Ankle Circles/Pumps: AROM, Both, 10 reps Heel Slides: AROM, AAROM, Right, 15 reps Hip ABduction/ADduction: AROM, AAROM, Right, 15 reps    General Comments        Pertinent Vitals/Pain Pain Assessment Pain Assessment: Faces Faces Pain Scale: Hurts a little bit Pain Location: R hip and L rib cage Pain Descriptors / Indicators:  Sore Pain Intervention(s): Limited activity within patient's tolerance, Monitored during session, Ice applied    Home Living                          Prior Function            PT Goals (current goals can now be found in the care plan section) Acute Rehab PT Goals Patient Stated Goal: Regain IND, playing the organ PT Goal Formulation: With patient Time For Goal Achievement: 06/29/22 Potential to Achieve Goals:  Good Progress towards PT goals: Progressing toward goals    Frequency    Min 3X/week      PT Plan Current plan remains appropriate    Co-evaluation              AM-PAC PT "6 Clicks" Mobility   Outcome Measure  Help needed turning from your back to your side while in a flat bed without using bedrails?: A Little Help needed moving from lying on your back to sitting on the side of a flat bed without using bedrails?: A Little Help needed moving to and from a bed to a chair (including a wheelchair)?: A Little Help needed standing up from a chair using your arms (e.g., wheelchair or bedside chair)?: A Little Help needed to walk in hospital room?: A Little Help needed climbing 3-5 steps with a railing? : A Little 6 Click Score: 18    End of Session Equipment Utilized During Treatment: Gait belt Activity Tolerance: Patient tolerated treatment well Patient left: in chair;with call bell/phone within reach;with chair alarm set Nurse Communication: Mobility status PT Visit Diagnosis: Unsteadiness on feet (R26.81);History of falling (Z91.81);Difficulty in walking, not elsewhere classified (R26.2);Pain Pain - Right/Left: Right Pain - part of body: Hip     Time: 1140-1151 PT Time Calculation (min) (ACUTE ONLY): 11 min  Charges:  $Gait Training: 8-22 mins                     Baxter Flattery, PT  Acute Rehab Dept [facility]) [phone]  [facility] Weekend Pager [facility] only)  [phone]  06/14/2022    [facility] 06/14/2022, 12:34 PM

## 2022-06-15 DIAGNOSIS — W19XXXA Unspecified fall, initial encounter: Secondary | ICD-10-CM | POA: Diagnosis not present

## 2022-06-15 DIAGNOSIS — G47419 Narcolepsy without cataplexy: Secondary | ICD-10-CM | POA: Diagnosis not present

## 2022-06-15 DIAGNOSIS — E039 Hypothyroidism, unspecified: Secondary | ICD-10-CM | POA: Diagnosis not present

## 2022-06-15 DIAGNOSIS — S72009A Fracture of unspecified part of neck of unspecified femur, initial encounter for closed fracture: Secondary | ICD-10-CM | POA: Diagnosis not present

## 2022-06-19 DIAGNOSIS — R52 Pain, unspecified: Secondary | ICD-10-CM | POA: Diagnosis not present

## 2022-06-19 DIAGNOSIS — S72009A Fracture of unspecified part of neck of unspecified femur, initial encounter for closed fracture: Secondary | ICD-10-CM | POA: Diagnosis not present

## 2022-06-21 DIAGNOSIS — I4891 Unspecified atrial fibrillation: Secondary | ICD-10-CM | POA: Diagnosis not present

## 2022-06-21 DIAGNOSIS — R52 Pain, unspecified: Secondary | ICD-10-CM | POA: Diagnosis not present

## 2022-06-21 DIAGNOSIS — S72009A Fracture of unspecified part of neck of unspecified femur, initial encounter for closed fracture: Secondary | ICD-10-CM | POA: Diagnosis not present

## 2022-06-23 ENCOUNTER — Other Ambulatory Visit: Payer: Self-pay | Admitting: Internal Medicine

## 2022-06-23 DIAGNOSIS — I10 Essential (primary) hypertension: Secondary | ICD-10-CM

## 2022-06-27 DIAGNOSIS — S72141D Displaced intertrochanteric fracture of right femur, subsequent encounter for closed fracture with routine healing: Secondary | ICD-10-CM | POA: Diagnosis not present

## 2022-07-10 IMAGING — CT CT CERVICAL SPINE W/O CM
3 series · 14 of 35 positions shown, 17 images · non-contrast
Comparison: 03/09/2020

CLINICAL DATA: Fall with trauma to the head and neck.

EXAM:
CT HEAD WITHOUT CONTRAST
CT MAXILLOFACIAL WITHOUT CONTRAST
CT CERVICAL SPINE WITHOUT CONTRAST
TECHNIQUE: Multidetector CT imaging of the head, cervical spine, and
maxillofacial structures were performed using the standard protocol
without intravenous contrast. Multiplanar CT image reconstructions
of the cervical spine and maxillofacial structures were also
generated.

[Series 5: c spine soft · axial · 0.41mm/px · z∈[-193,-77]mm · 6 of 76 slices shown, 8 images]
[im 12/76  soft-tissue]
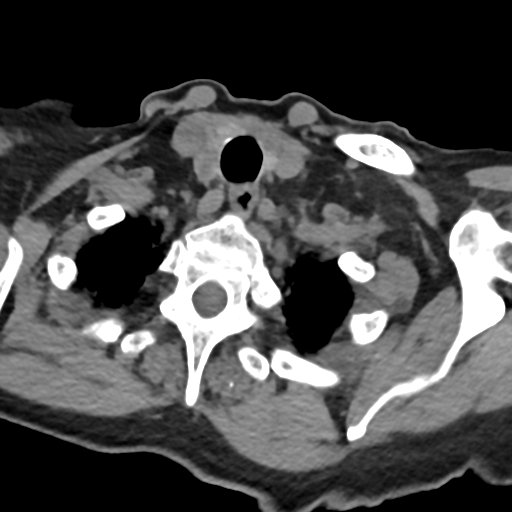
[im 12/76  bone]
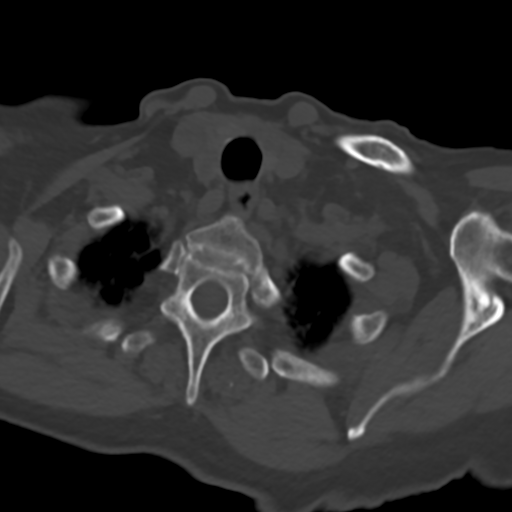
[im 24/76  bone]
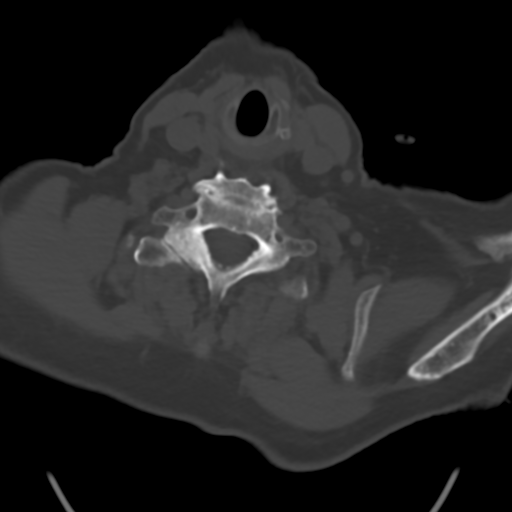
[im 35/76  bone]
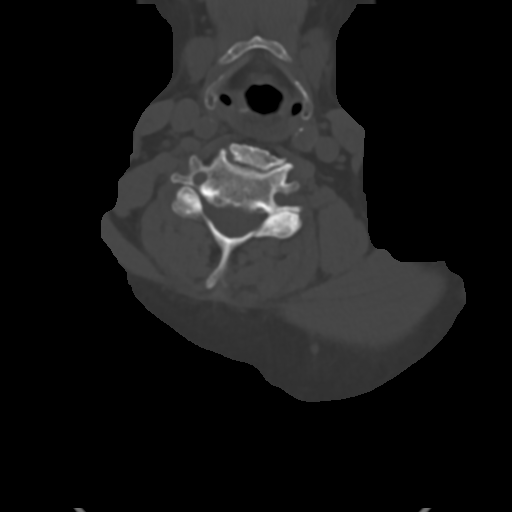
[im 47/76  bone]
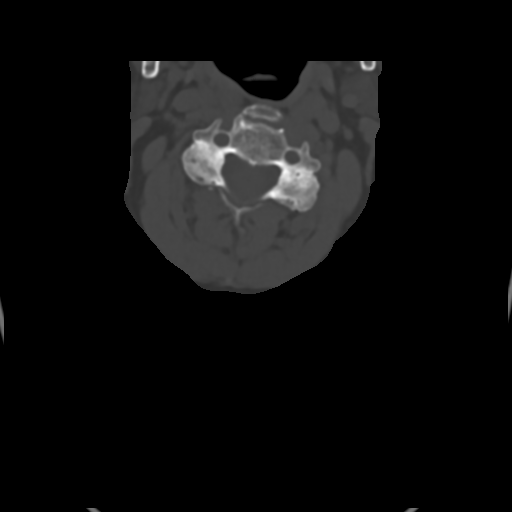
[im 58/76  soft-tissue]
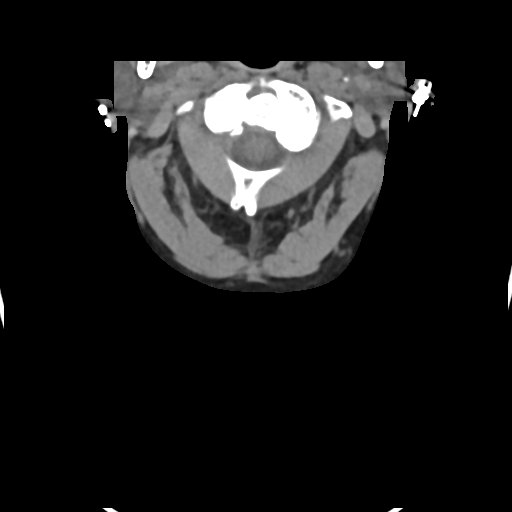
[im 58/76  bone]
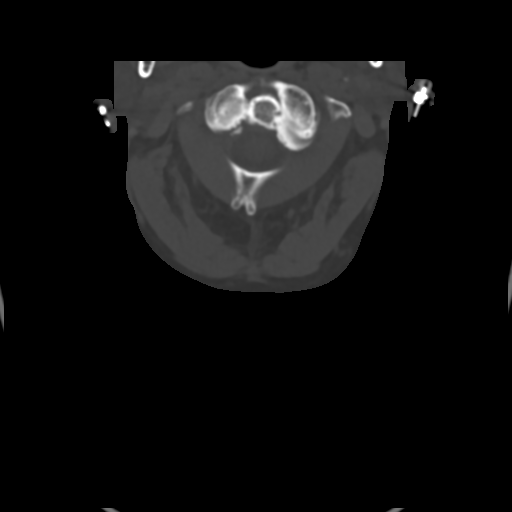
[im 70/76  bone]
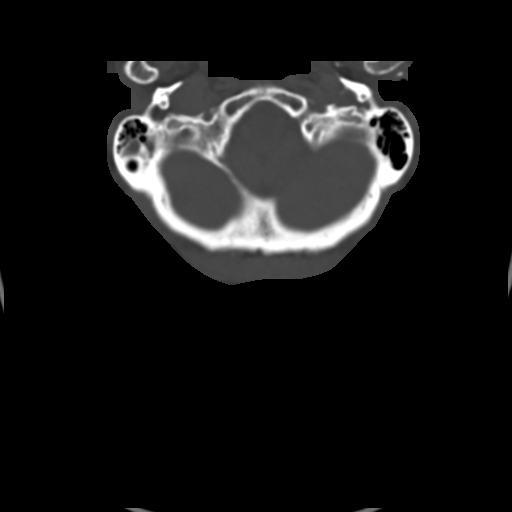

[Series 9: sag bone · sagittal · 0.33mm/px · 5 of 83 slices shown, 6 images]
[im 28/83  bone]
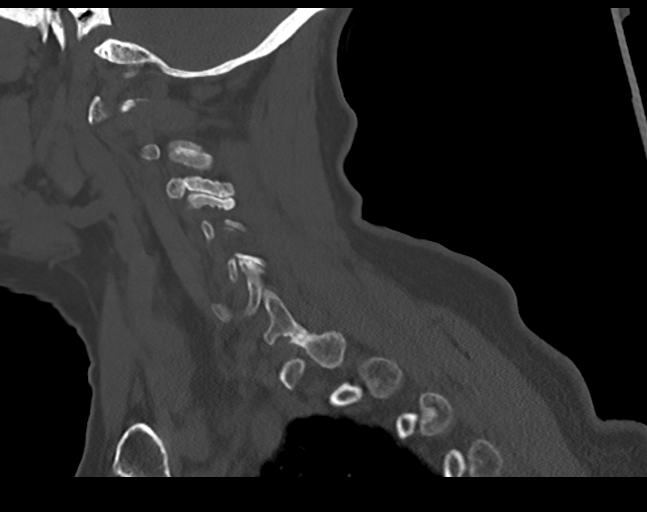
[im 35/83  bone]
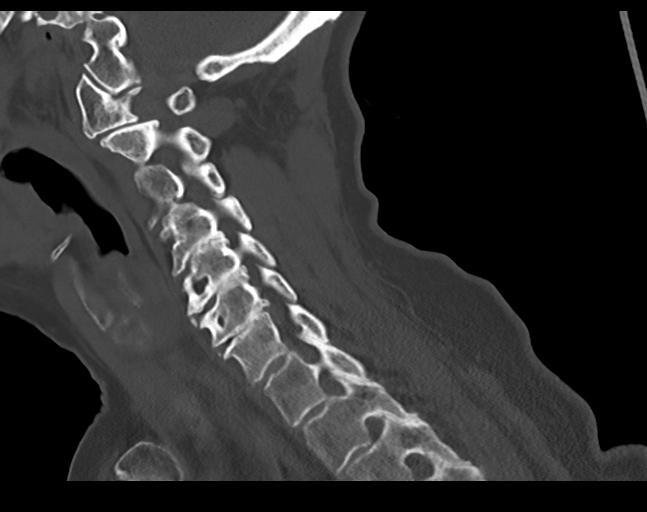
[im 42/83  soft-tissue]
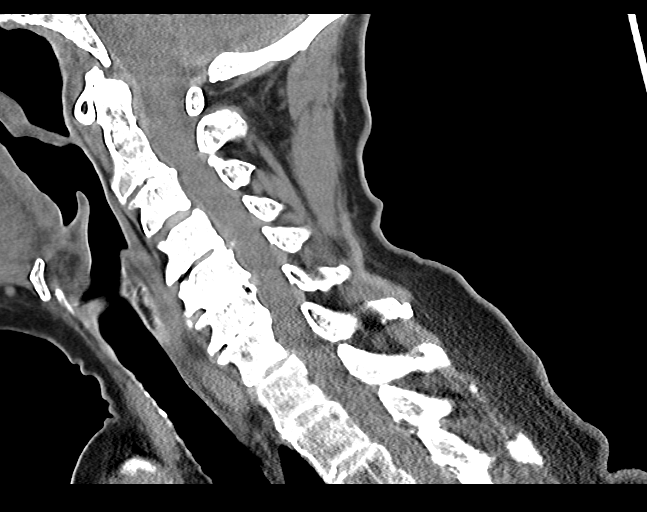
[im 42/83  bone]
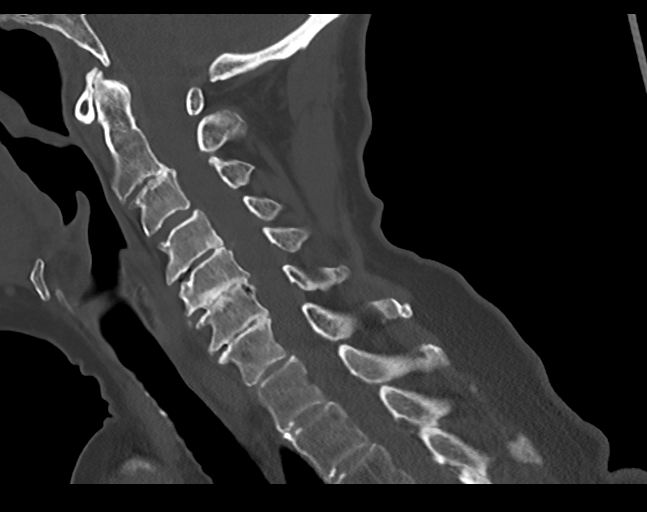
[im 48/83  bone]
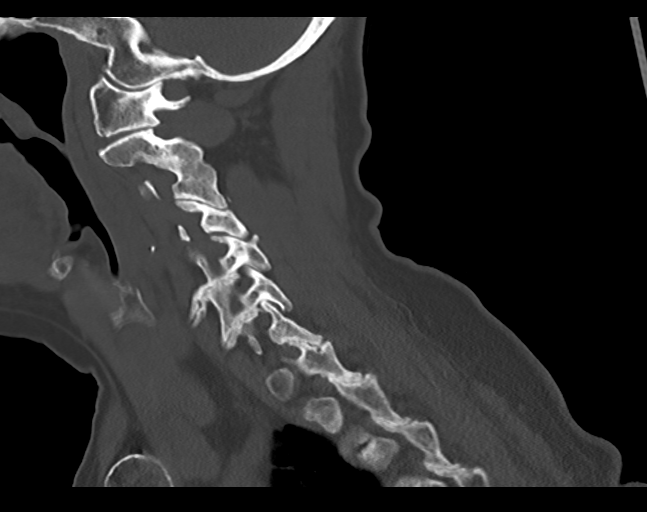
[im 55/83  bone]
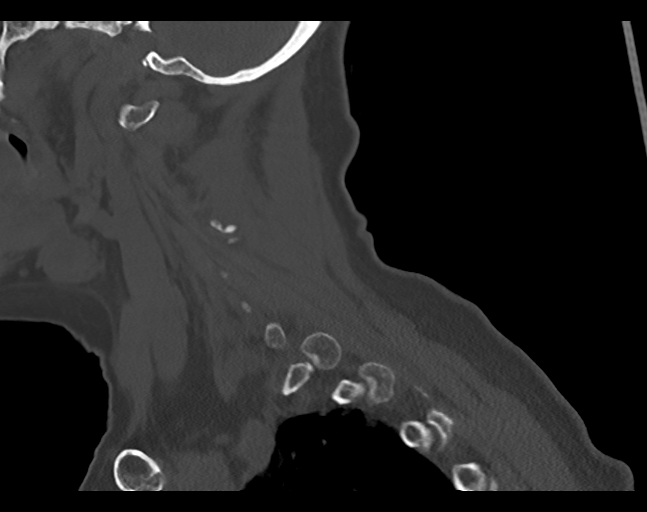

[Series 10: cor bone · coronal · 0.32mm/px · 3 of 106 slices shown]
[im 22/106  bone]
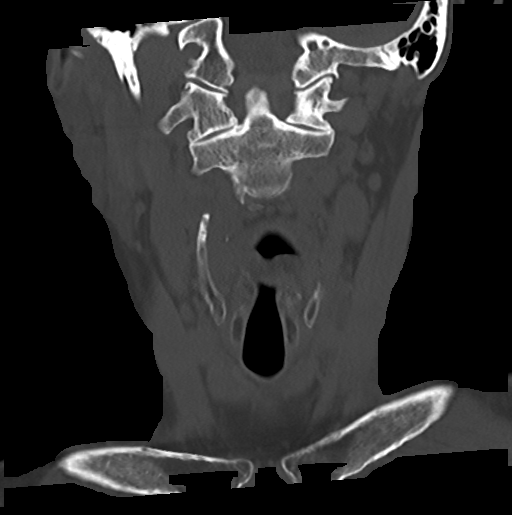
[im 43/106  bone]
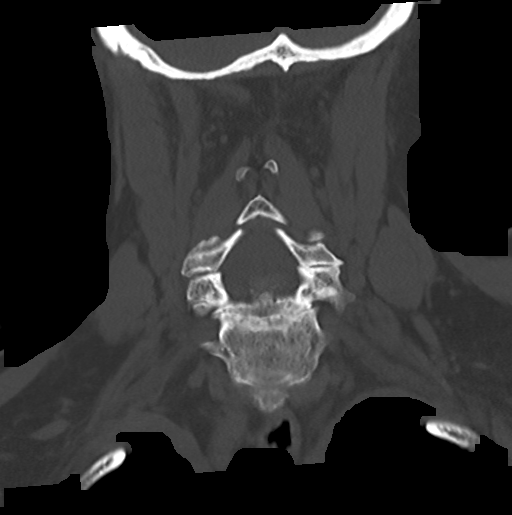
[im 64/106  bone]
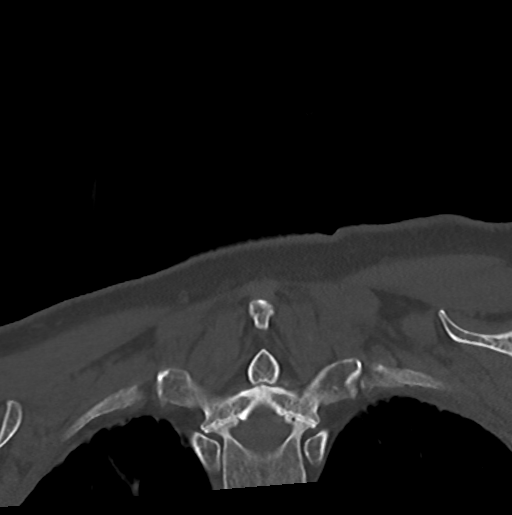

[14 of 35 positions shown; findings below may reference images not displayed]

FINDINGS: CT HEAD FINDINGS

Brain: Brain does not show accelerated atrophy for age. No evidence
of acute infarction, mass lesion, hemorrhage, hydrocephalus or
extra-axial collection.

Vascular: No abnormal vascular finding.

Skull: No skull fracture.

Other: None

CT MAXILLOFACIAL FINDINGS

Osseous: No facial fracture.

Orbits: No orbital pathologic finding.

Sinuses: Sinuses are clear.

Soft tissues: Soft tissue swelling of the left cheek.

CT CERVICAL SPINE FINDINGS

Alignment: No traumatic malalignment.

Skull base and vertebrae: No fracture or primary bone lesion.

Soft tissues and spinal canal: No traumatic soft tissue finding.

Disc levels: Foramen magnum is widely patent. Osteoarthritis of the
C1-2 articulation could be associated with craniocervical pain
syndromes. C2-3 shows chronic fusion without stenosis.

C3-4: Mild spondylosis. Facet osteoarthritis worse on the left. Mild
bilateral foraminal narrowing.

C4-5: Spondylosis more pronounced on the left. Facet osteoarthritis
worse on the right. Mild bony foraminal stenosis on both sides.

C5-6: Chronic disc degeneration with loss of disc height. Endplate
osteophytes. No compressive stenosis.

C6-7: Chronic disc degeneration loss of disc height. Endplate
osteophytes. Moderate bilateral bony foraminal narrowing.

C7-T1: Bilateral facet osteoarthritis. Mild bilateral foraminal
narrowing.

Upper chest: Negative

Other: None
IMPRESSION: HEAD CT:

No acute or traumatic finding. Normal appearance of the brain for
age.

MAXILLOFACIAL CT:

No facial fracture. Soft tissue swelling of the left cheek.

CERVICAL SPINE CT:

No acute or traumatic finding. Chronic degenerative changes as
outlined above.

## 2022-07-21 ENCOUNTER — Ambulatory Visit: Payer: Medicare HMO | Attending: Internal Medicine | Admitting: Internal Medicine

## 2022-07-21 ENCOUNTER — Encounter: Payer: Self-pay | Admitting: Internal Medicine

## 2022-07-21 VITALS — BP 130/70 | HR 71 | Temp 97.9°F | Ht 63.0 in | Wt 152.0 lb

## 2022-07-21 DIAGNOSIS — M8580 Other specified disorders of bone density and structure, unspecified site: Secondary | ICD-10-CM | POA: Diagnosis not present

## 2022-07-21 DIAGNOSIS — M62838 Other muscle spasm: Secondary | ICD-10-CM | POA: Diagnosis not present

## 2022-07-21 DIAGNOSIS — S72001S Fracture of unspecified part of neck of right femur, sequela: Secondary | ICD-10-CM

## 2022-07-21 DIAGNOSIS — E039 Hypothyroidism, unspecified: Secondary | ICD-10-CM | POA: Diagnosis not present

## 2022-07-21 DIAGNOSIS — D649 Anemia, unspecified: Secondary | ICD-10-CM | POA: Diagnosis not present

## 2022-07-21 DIAGNOSIS — Z780 Asymptomatic menopausal state: Secondary | ICD-10-CM

## 2022-07-21 DIAGNOSIS — Z23 Encounter for immunization: Secondary | ICD-10-CM | POA: Diagnosis not present

## 2022-07-21 MED ORDER — ZOSTER VAC RECOMB ADJUVANTED 50 MCG/0.5ML IM SUSR: 0.5000 mL | Freq: Once | INTRAMUSCULAR | 0 refills | Status: AC

## 2022-07-21 NOTE — Progress Notes (Signed)
Patient ID: Krystal Moore, female    DOB: 01/16/1930  MRN: [id number]  CC: Hypothyroidism (Hypothyroidism f/u. No questions / concerns. )   Subjective: Krystal Moore is a 88 y.o. female who presents for chronic disease management. Her concerns today include:  Hx of osteopenia on Fosamax, hypothyroid, obesity, Vit B12 def, RT CTS, OA cervical spine, lumbar spinal stenosis,  Since last visit with me, patient was admitted 8/16-23/2023 for right hip pain.  Limb of a tree fell on her causing her to fall and fracture RT hip.  Repaired by orthopedics Dr. Lyla Glassing from [facility] and she was subsequently sent to a SNF for 6 days.  She had developed some postoperative anemia with hemoglobin at 11 by the time of discharge.   Home x 2 wks from SNF.  States she was suppose to have home P.T but nothing as yet.  Has f/u with ortho next wk.  Ambulating with standard walker with wheel on front wheels. She is getting around okay; does her own cooking.  Neighbor comes over daily and spends several hours with her.  She helps her with light housekeeping and baths.  She feels she can use an aid but is not comfortable having a stranger coming to the house. Completed bottle ASA. Takes Ca+Vit D daily plus an extra Vit D 3 125 mcg for hx of osteopenia.  .   Still has about 10 Tizanidine left from previous rxn from me when she had back issues.  Still gets spasms sometimes in LT thigh; takes the Tizanidine PRN.  Thyroid:  taking Levothyroxine 112 mcg daily  BP elev today on initial check.  No issues with BP while in hosp  HM:  due for flu and PCV 15.  Reports had 1 Shingles vaccine at [facility] on [address] [facility] [employer].   .    Patient Active Problem List   Diagnosis Date Noted   Closed right hip fracture ([place]) 06/07/2022   Fall at home, initial encounter 06/07/2022   Numbness 07/30/2019   Spinal stenosis of lumbar region 07/30/2019   Osteopenia after menopause 05/29/2019    Over weight 05/29/2019   Elevated blood pressure reading 05/29/2019   Immunization due 08/03/2017   Left leg swelling 05/04/2017   Allergic rhinitis 10/11/2015   Hypothyroidism 12/10/2013   Peripheral neuropathy 05/16/2013     Current Outpatient Medications on File Prior to Visit  Medication Sig Dispense Refill   calcium carbonate (OS-CAL - DOSED IN MG OF ELEMENTAL CALCIUM) 1250 (500 Ca) MG tablet Take 1 tablet by mouth daily.     Cholecalciferol (VITAMIN D3) 125 MCG (5000 UT) CAPS Take 1 capsule by mouth daily.     cyanocobalamin (VITAMIN B12) 1000 MCG tablet Take 3,000 mcg by mouth daily.     Krill Oil 1000 MG CAPS Take 1,000 mg by mouth daily.     lidocaine (LIDODERM) 5 % Place 1 patch onto the skin every 12 (twelve) hours. Remove & Discard patch within 12 hours or as directed by MD Apply on the painful left lower back 10 patch 0   senna (SENOKOT) 8.6 MG TABS tablet Take 1 tablet (8.6 mg total) by mouth 2 (two) times daily. 120 tablet 0   levothyroxine (SYNTHROID) 112 MCG tablet TAKE 1 TABLET BY MOUTH EVERY DAY BEFORE BREAKFAST (Patient not taking: Reported on 07/21/2022) 90 tablet 3   No current facility-administered medications on file prior to visit.    Allergies  Allergen Reactions   Elemental Sulfur Shortness Of  Breath and Swelling   Milk-Related Compounds Nausea And Vomiting   Poison Ivy Extract [Poison Ivy Extract] Itching, Rash and Other (See Comments)    Muscle damage    Tobacco [Tobacco] Anaphylaxis and Swelling    Allergic to cigarette smoke   Cymbalta [Duloxetine Hcl] Diarrhea   Orange Juice [Orange Oil] Nausea And Vomiting    Social History   Socioeconomic History   Marital status: Widowed    Spouse name: Not on file   Number of children: 0   Years of education: college   Highest education level: Not on file  Occupational History   Not on file  Tobacco Use   Smoking status: Never   Smokeless tobacco: Never  Substance and Sexual Activity   Alcohol use:  Yes    Comment: 1/4 glass per day   Drug use: No   Sexual activity: Not Currently  Other Topics Concern   Not on file  Social History Narrative   ** Merged History Encounter **       07/30/19 Patient lives at home and she has a roommate.  Has a poodle.  Patient works part time Advertising copywriter. Patient has a college education. Both handed. Plays organ since age 66.  Caffeine- None    Social Determinants of Health   Financial Resource Strain: Not on file  Food Insecurity: Not on file  Transportation Needs: Not on file  Physical Activity: Not on file  Stress: Not on file  Social Connections: Not on file  Intimate Partner Violence: Not on file    Family History  Problem Relation Age of Onset   Stroke Mother        questionable   Clotting disorder Mother    Heart attack Father     Past Surgical History:  Procedure Laterality Date   CATARACT EXTRACTION Left 06/2019   INTRAMEDULLARY (IM) NAIL INTERTROCHANTERIC Right 06/08/2022   Procedure: INTRAMEDULLARY (IM) NAIL INTERTROCHANTERIC;  Surgeon: Rod Can, MD;  Location: [facility];  Service: Orthopedics;  Laterality: Right;   MOUTH SURGERY     NASAL SINUS SURGERY      ROS: Review of Systems Negative except as stated above  PHYSICAL EXAM: BP 130/70   Pulse 71   Temp 97.9 F (36.6 C) (Oral)   Ht '5\' 3"'$  (1.6 m)   Wt 152 lb (68.9 kg)   SpO2 98%   BMI 26.93 kg/m   Physical Exam  General appearance - alert, well appearing, pleasant elderly female and in no distress Neck - bilateral symmetric anterior adenopathy Chest - clear to auscultation, no wheezes, rales or rhonchi, symmetric air entry Heart - normal rate, regular rhythm, normal S1, S2, no murmurs, rubs, clicks or gallops Extremities - peripheral pulses normal, no pedal edema, no clubbing or cyanosis MSK: Patient sitting in chair.  She has her walker with her.     Latest Ref Rng & Units 06/11/2022    6:29 AM 06/10/2022    3:53 AM 06/09/2022    3:39 AM  CMP   Glucose 70 - 99 mg/dL 98  102  179   BUN 8 - 23 mg/dL '8  12  13   '$ Creatinine 0.44 - 1.00 mg/dL 0.81  0.84  0.87   Sodium 135 - 145 mmol/L 137  134  134   Potassium 3.5 - 5.1 mmol/L 4.1  4.0  3.8   Chloride 98 - 111 mmol/L 107  103  104   CO2 22 - 32 mmol/L 26  25  24   Calcium 8.9 - 10.3 mg/dL 9.1  9.0  8.8    Lipid Panel     Component Value Date/Time   CHOL 233 (H) 05/29/2019 1127   TRIG 55 05/29/2019 1127   HDL 90 05/29/2019 1127   CHOLHDL 2.6 05/29/2019 1127   CHOLHDL 3.1 06/08/2016 1155   VLDL 20 06/08/2016 1155   LDLCALC 132 (H) 05/29/2019 1127    CBC    Component Value Date/Time   WBC 5.4 06/11/2022 0629   RBC 3.66 (L) 06/11/2022 0629   HGB 11.5 (L) 06/11/2022 0629   HGB 14.0 05/29/2019 1127   HGB 13.5 06/07/2015 1240   HCT 34.7 (L) 06/11/2022 0629   HCT 41.0 05/29/2019 1127   HCT 41.0 06/07/2015 1240   PLT 210 06/11/2022 0629   PLT 238 05/29/2019 1127   MCV 94.8 06/11/2022 0629   MCV 92 05/29/2019 1127   MCV 91.9 06/07/2015 1240   MCH 31.4 06/11/2022 0629   MCHC 33.1 06/11/2022 0629   RDW 13.2 06/11/2022 0629   RDW 12.2 05/29/2019 1127   RDW 13.6 06/07/2015 1240   LYMPHSABS 1.6 06/07/2022 1149   LYMPHSABS 1.5 06/07/2015 1240   MONOABS 0.3 06/07/2022 1149   MONOABS 0.4 06/07/2015 1240   EOSABS 0.2 06/07/2022 1149   EOSABS 0.2 06/07/2015 1240   BASOSABS 0.0 06/07/2022 1149   BASOSABS 0.1 06/07/2015 1240    ASSESSMENT AND PLAN:  1. Closed fracture of right hip, sequela Patient seems to be progressing well and doing go considering her age and that she lives alone.  She has follow-up appointment with Ortho next week.  Advised to ask about home physical therapy at that visit. She declines having an aide.  She is getting much assistance from her neighbor who she trusts.  2. Osteopenia after menopause She will continue calcium and vitamin D supplement.  We will check vitamin D level today. - VITAMIN D 25 Hydroxy (Vit-D Deficiency, Fractures)  3.  Hypothyroidism, unspecified type Continue current dose of levothyroxine. - TSH  4. Muscle spasm of left lower extremity She can continue to use tizanidine as needed.  Warned that the medicine can cause drowsiness.  5. Need for influenza vaccination - Flu Vaccine QUAD High Dose(Fluad)  6. Need for vaccination against Streptococcus pneumoniae - PNEUMOCOCCAL CONJUGATE VACCINE 15-VALENT  7. Need for shingles vaccine Given prescription to take to her pharmacy to get the second Shingrix vaccine. - Zoster Vaccine Adjuvanted [facility]) injection; Inject 0.5 mLs into the muscle once for 1 dose.  Dispense: 0.5 mL; Refill: 0  8. Normocytic anemia Patient with postoperative anemia.  We will recheck CBC today to make sure that her levels have come back up. - CBC    Patient was given the opportunity to ask questions.  Patient verbalized understanding of the plan and was able to repeat key elements of the plan.   This documentation was completed using Radio producer.  Any transcriptional errors are unintentional.  Orders Placed This Encounter  Procedures   Flu Vaccine QUAD High Dose(Fluad)   PNEUMOCOCCAL CONJUGATE VACCINE 15-VALENT   CBC   VITAMIN D 25 Hydroxy (Vit-D Deficiency, Fractures)   TSH     Requested Prescriptions   Signed Prescriptions Disp Refills   Zoster Vaccine Adjuvanted [facility]) injection 0.5 mL 0    Sig: Inject 0.5 mLs into the muscle once for 1 dose.    Return in about 4 months (around 11/20/2022).  Karle Plumber, MD, FACP

## 2022-07-22 LAB — CBC
Hematocrit: 38.4 % (ref 34.0–46.6)
Hemoglobin: 13.4 g/dL (ref 11.1–15.9)
MCH: 31.3 pg (ref 26.6–33.0)
MCHC: 34.9 g/dL (ref 31.5–35.7)
MCV: 90 fL (ref 79–97)
Platelets: 297 10*3/uL (ref 150–450)
RBC: 4.28 x10E6/uL (ref 3.77–5.28)
RDW: 12.6 % (ref 11.7–15.4)
WBC: 5.3 10*3/uL (ref 3.4–10.8)

## 2022-07-22 LAB — TSH: TSH: 1.05 u[IU]/mL (ref 0.450–4.500)

## 2022-07-22 LAB — VITAMIN D 25 HYDROXY (VIT D DEFICIENCY, FRACTURES): Vit D, 25-Hydroxy: 49.4 ng/mL (ref 30.0–100.0)

## 2022-07-23 ENCOUNTER — Other Ambulatory Visit: Payer: Self-pay | Admitting: Internal Medicine

## 2022-07-23 DIAGNOSIS — M7989 Other specified soft tissue disorders: Secondary | ICD-10-CM

## 2022-07-24 ENCOUNTER — Other Ambulatory Visit: Payer: Self-pay | Admitting: Pharmacist

## 2022-07-25 DIAGNOSIS — S72141A Displaced intertrochanteric fracture of right femur, initial encounter for closed fracture: Secondary | ICD-10-CM | POA: Diagnosis not present

## 2022-08-04 DIAGNOSIS — G5601 Carpal tunnel syndrome, right upper limb: Secondary | ICD-10-CM | POA: Diagnosis not present

## 2022-08-16 ENCOUNTER — Encounter: Payer: Self-pay | Admitting: Internal Medicine

## 2022-08-16 DIAGNOSIS — G5600 Carpal tunnel syndrome, unspecified upper limb: Secondary | ICD-10-CM | POA: Insufficient documentation

## 2022-08-23 DIAGNOSIS — G5601 Carpal tunnel syndrome, right upper limb: Secondary | ICD-10-CM | POA: Diagnosis not present

## 2022-08-31 DIAGNOSIS — G5601 Carpal tunnel syndrome, right upper limb: Secondary | ICD-10-CM | POA: Diagnosis not present

## 2022-09-05 DIAGNOSIS — S72141A Displaced intertrochanteric fracture of right femur, initial encounter for closed fracture: Secondary | ICD-10-CM | POA: Diagnosis not present

## 2022-09-09 ENCOUNTER — Other Ambulatory Visit: Payer: Self-pay | Admitting: Internal Medicine

## 2022-09-09 DIAGNOSIS — M5416 Radiculopathy, lumbar region: Secondary | ICD-10-CM

## 2022-09-11 NOTE — Telephone Encounter (Signed)
Requested medication (s) are due for refill today - no   Requested medication (s) are on the active medication list -no  Future visit scheduled -yes  Last refill: 01/25/22  Notes to clinic: non delegated Rx- no longer on current medication list  Requested Prescriptions  Pending Prescriptions Disp Refills   tiZANidine (ZANAFLEX) 4 MG tablet [Pharmacy Med Name: TIZANIDINE '4MG'$  TABLETS] 30 tablet 1    Sig: TAKE 1 TABLET(4 MG) BY MOUTH EVERY 8 HOURS AS NEEDED FOR MUSCLE SPASMS     Not Delegated - Cardiovascular:  Alpha-2 Agonists - tizanidine Failed - 09/09/2022  2:18 PM      Failed - This refill cannot be delegated      Passed - Valid encounter within last 6 months    Recent Outpatient Visits           1 month ago Closed fracture of right hip, sequela   [place], Deborah B, MD   8 months ago Hypothyroidism, unspecified type   [place], [place], [place]   1 year ago Left lumbar radiculopathy   [place], Deborah B, MD   1 year ago Hypothyroidism, unspecified type   [place], Enobong, MD   1 year ago Hospital discharge follow-up   [place], Zelda W, NP       Future Appointments             In 2 months Ladell Pier, MD [place]               Requested Prescriptions  Pending Prescriptions Disp Refills   tiZANidine (ZANAFLEX) 4 MG tablet [Pharmacy Med Name: TIZANIDINE '4MG'$  TABLETS] 30 tablet 1    Sig: TAKE 1 TABLET(4 MG) BY MOUTH EVERY 8 HOURS AS NEEDED FOR MUSCLE SPASMS     Not Delegated - Cardiovascular:  Alpha-2 Agonists - tizanidine Failed - 09/09/2022  2:18 PM      Failed - This refill cannot be delegated      Passed - Valid encounter within last 6 months    Recent Outpatient Visits           1 month ago Closed fracture of  right hip, sequela   [place], [place]   8 months ago Hypothyroidism, unspecified type   [place], [place]   1 year ago Left lumbar radiculopathy   [place], MD   1 year ago Hypothyroidism, unspecified type   [place], Enobong, MD   1 year ago Hospital discharge follow-up   [place], Zelda W, NP       Future Appointments             In 2 months Ladell Pier, MD [place]

## 2022-09-28 ENCOUNTER — Other Ambulatory Visit: Payer: Self-pay | Admitting: Internal Medicine

## 2022-09-28 DIAGNOSIS — E039 Hypothyroidism, unspecified: Secondary | ICD-10-CM

## 2022-10-31 DIAGNOSIS — G5601 Carpal tunnel syndrome, right upper limb: Secondary | ICD-10-CM | POA: Diagnosis not present

## 2022-11-13 DIAGNOSIS — M25531 Pain in right wrist: Secondary | ICD-10-CM | POA: Diagnosis not present

## 2022-11-20 ENCOUNTER — Ambulatory Visit: Payer: Medicare HMO | Attending: Internal Medicine | Admitting: Internal Medicine

## 2022-11-20 ENCOUNTER — Encounter: Payer: Self-pay | Admitting: Internal Medicine

## 2022-11-20 VITALS — BP 112/69 | HR 64 | Temp 98.3°F | Wt 158.6 lb

## 2022-11-20 DIAGNOSIS — R202 Paresthesia of skin: Secondary | ICD-10-CM

## 2022-11-20 DIAGNOSIS — Z780 Asymptomatic menopausal state: Secondary | ICD-10-CM

## 2022-11-20 DIAGNOSIS — M8580 Other specified disorders of bone density and structure, unspecified site: Secondary | ICD-10-CM

## 2022-11-20 DIAGNOSIS — E663 Overweight: Secondary | ICD-10-CM

## 2022-11-20 MED ORDER — ALENDRONATE SODIUM 35 MG PO TABS: 35.0000 mg | ORAL_TABLET | ORAL | 3 refills | Status: DC

## 2022-11-20 MED ORDER — GABAPENTIN 100 MG PO CAPS: 100.0000 mg | ORAL_CAPSULE | Freq: Every day | ORAL | 3 refills | Status: DC

## 2022-11-20 NOTE — Progress Notes (Signed)
Patient ID: Krystal Moore, female    DOB: [birth year]  MRN: [id number]  CC: No chief complaint on file.   Subjective: Krystal Moore is a 89 y.o. female who presents for chronic ds management Her concerns today include:  Hx of osteopenia on Fosamax, hypothyroid, obesity, Vit B12 def, RT CTS, OA cervical spine, lumbar spinal stenosis,    Hypothyroid:  Compliant with Levothyroxine 112 mcg daily.  Last Tsh was 1.05. Osteopenia: She was on Fosamax 35 mg once a week in the past but has been out of it for a while.  She is not sure what had happened with it.  She is taking calcium and vitamin D daily.  Last vitamin D level was checked on last visit and it was 49.  She has not had any falls since last visit.  She feels her right hip is doing well.  She just started walking 2 blocks to and from her house every day.  She has gained 6 pounds since last visit.  She is working to try get the weight back down.  She spent some time today talking about the right hand and wrist.  She had fractured the wrist back in July 2022.  Wore a cast for a while.  Continues to have problem with the wrist and tingling in the hand.  EMG revealed severe median nerve compression.  She underwent carpal tunnel release.  However she continues to have soreness in the wrist and tingling in the hand.  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  She has tried various over-the-counter medications including anti-inflammatories and Tylenol without relief.  Tingling keeps her awake at nights.  Her orthopedic specialist told her there is not a whole lot further that can be done.  She is doing physical therapy once a week on her hands.  They use warm wax during her therapy sessions and she finds it very helpful.  She was told she can get 1 but her insurance does not cover for it.  Patient Active Problem List   Diagnosis Date Noted   CTS (carpal  tunnel syndrome) 08/16/2022   Closed right hip fracture ([place]) 06/07/2022   Fall at home, initial encounter 06/07/2022   Numbness 07/30/2019   Spinal stenosis of lumbar region 07/30/2019   Osteopenia after menopause 05/29/2019   Over weight 05/29/2019   Elevated blood pressure reading 05/29/2019   Immunization due 08/03/2017   Left leg swelling 05/04/2017   Allergic rhinitis 10/11/2015   Hypothyroidism 12/10/2013   Peripheral neuropathy 05/16/2013     Current Outpatient Medications on File Prior to Visit  Medication Sig Dispense Refill   furosemide (LASIX) 20 MG tablet TAKE 1 TABLET BY MOUTH EVERY DAY FOR 5 DAYS THEN 1 EVERY DAY AS NEEDED FOR SWELLING 10 tablet 0   levothyroxine (SYNTHROID) 112 MCG tablet TAKE 1 TABLET BY MOUTH EVERY DAY BEFORE BREAKFAST 90 tablet 0   calcium carbonate (OS-CAL - DOSED IN MG OF ELEMENTAL CALCIUM) 1250 (500 Ca) MG tablet Take 1 tablet by mouth daily.     Cholecalciferol (VITAMIN D3) 125 MCG (5000 UT) CAPS Take 1 capsule by mouth daily.     cyanocobalamin (VITAMIN B12) 1000 MCG tablet Take 3,000 mcg by mouth daily.     Krill Oil 1000 MG CAPS Take 1,000 mg by mouth daily.     lidocaine (LIDODERM) 5 % Place 1 patch onto the skin every  12 (twelve) hours. Remove & Discard patch within 12 hours or as directed by MD Apply on the painful left lower back 10 patch 0   senna (SENOKOT) 8.6 MG TABS tablet Take 1 tablet (8.6 mg total) by mouth 2 (two) times daily. 120 tablet 0   tiZANidine (ZANAFLEX) 4 MG tablet Take 1 tablet (4 mg total) by mouth 2 (two) times daily as needed for muscle spasms. 30 tablet 1   No current facility-administered medications on file prior to visit.    Allergies  Allergen Reactions   Elemental Sulfur Shortness Of Breath and Swelling   Milk-Related Compounds Nausea And Vomiting   Poison Ivy Extract [Poison Ivy Extract] Itching, Rash and Other (See Comments)    Muscle damage    Tobacco [Tobacco] Anaphylaxis and Swelling    Allergic to  cigarette smoke   Cymbalta [Duloxetine Hcl] Diarrhea   Orange Juice [Orange Oil] Nausea And Vomiting    Social History   Socioeconomic History   Marital status: Widowed    Spouse name: Not on file   Number of children: 0   Years of education: college   Highest education level: Not on file  Occupational History   Not on file  Tobacco Use   Smoking status: Never   Smokeless tobacco: Never  Substance and Sexual Activity   Alcohol use: Yes    Comment: 1/4 glass per day   Drug use: No   Sexual activity: Not Currently  Other Topics Concern   Not on file  Social History Narrative   ** Merged History Encounter **       07/30/19 Patient lives at home and she has a roommate.  Has a poodle.  Patient works part time Advertising copywriter. Patient has a college education. Both handed. Plays organ since age 86.  Caffeine- None    Social Determinants of Health   Financial Resource Strain: Not on file  Food Insecurity: Not on file  Transportation Needs: Not on file  Physical Activity: Not on file  Stress: Not on file  Social Connections: Not on file  Intimate Partner Violence: Not on file    Family History  Problem Relation Age of Onset   Stroke Mother        questionable   Clotting disorder Mother    Heart attack Father     Past Surgical History:  Procedure Laterality Date   CATARACT EXTRACTION Left 06/2019   INTRAMEDULLARY (IM) NAIL INTERTROCHANTERIC Right 06/08/2022   Procedure: INTRAMEDULLARY (IM) NAIL INTERTROCHANTERIC;  Surgeon: Rod Can, MD;  Location: [facility];  Service: Orthopedics;  Laterality: Right;   MOUTH SURGERY     NASAL SINUS SURGERY      ROS: Review of Systems Negative except as stated above  PHYSICAL EXAM: BP 112/69 (BP Location: Right Arm)   Pulse 64   Temp 98.3 F (36.8 C)   Wt 158 lb 9.6 oz (71.9 kg)   SpO2 98%   BMI 28.09 kg/m   Wt Readings from Last 3 Encounters:  11/20/22 158 lb 9.6 oz (71.9 kg)  07/21/22 152 lb (68.9 kg)   06/08/22 163 lb 12.8 oz (74.3 kg)    Physical Exam  General appearance - alert, well appearing, and in no distress Mental status - normal mood, behavior, speech, dress, motor activity, and thought processes Neck - supple, no significant adenopathy Chest - clear to auscultation, no wheezes, rales or rhonchi, symmetric air entry Heart - normal rate, regular rhythm, normal S1, S2, no murmurs, rubs,  clicks or gallops Extremities -trace lower extremity edema MSK: She has deformity of the DIP joints of the index and middle finger right hand.  She also has deformity of the DIP joint index finger left hand.  Grip 4/5 on the right, 5/5 on the left.  Mild enlargement of the right wrist joint compared to the left.     Latest Ref Rng & Units 06/11/2022    6:29 AM 06/10/2022    3:53 AM 06/09/2022    3:39 AM  CMP  Glucose 70 - 99 mg/dL 98  102  179   BUN 8 - 23 mg/dL '8  12  13   '$ Creatinine 0.44 - 1.00 mg/dL 0.81  0.84  0.87   Sodium 135 - 145 mmol/L 137  134  134   Potassium 3.5 - 5.1 mmol/L 4.1  4.0  3.8   Chloride 98 - 111 mmol/L 107  103  104   CO2 22 - 32 mmol/L '26  25  24   '$ Calcium 8.9 - 10.3 mg/dL 9.1  9.0  8.8    Lipid Panel     Component Value Date/Time   CHOL 233 (H) 05/29/2019 1127   TRIG 55 05/29/2019 1127   HDL 90 05/29/2019 1127   CHOLHDL 2.6 05/29/2019 1127   CHOLHDL 3.1 06/08/2016 1155   VLDL 20 06/08/2016 1155   LDLCALC 132 (H) 05/29/2019 1127    CBC    Component Value Date/Time   WBC 5.3 07/21/2022 1445   WBC 5.4 06/11/2022 0629   RBC 4.28 07/21/2022 1445   RBC 3.66 (L) 06/11/2022 0629   HGB 13.4 07/21/2022 1445   HGB 13.5 06/07/2015 1240   HCT 38.4 07/21/2022 1445   HCT 41.0 06/07/2015 1240   PLT 297 07/21/2022 1445   MCV 90 07/21/2022 1445   MCV 91.9 06/07/2015 1240   MCH 31.3 07/21/2022 1445   MCH 31.4 06/11/2022 0629   MCHC 34.9 07/21/2022 1445   MCHC 33.1 06/11/2022 0629   RDW 12.6 07/21/2022 1445   RDW 13.6 06/07/2015 1240   LYMPHSABS 1.6 06/07/2022  1149   LYMPHSABS 1.5 06/07/2015 1240   MONOABS 0.3 06/07/2022 1149   MONOABS 0.4 06/07/2015 1240   EOSABS 0.2 06/07/2022 1149   EOSABS 0.2 06/07/2015 1240   BASOSABS 0.0 06/07/2022 1149   BASOSABS 0.1 06/07/2015 1240    ASSESSMENT AND PLAN: 1. Right hand paresthesia Patient with persistent paresthesia despite carpal tunnel release.  We discussed trying her with a low-dose of gabapentin to take at bedtime to see if this helps.  Advised to stop the medicine if it causes confusion or excessive drowsiness the following day.  She expressed understanding. - gabapentin (NEURONTIN) 100 MG capsule; Take 1 capsule (100 mg total) by mouth at bedtime.  Dispense: 30 capsule; Refill: 3  2. Osteopenia after menopause Patient has had some fragility fractures over the past several years.  She had osteopenia on last bone density done 5 years ago.  We will get an updated bone density study.  I recommend restarting Fosamax 35 mg once a week.  I went over with her how to take the medication first thing in the morning on an empty stomach with a big glass of water, sit upright for 30 minutes after taking it. Continue calcium plus vitamin D supplement daily. - DG Bone Density; Future - alendronate (FOSAMAX) 35 MG tablet; Take 1 tablet (35 mg total) by mouth every 7 (seven) days. Take with a full glass of water on an  empty stomach.  Dispense: 4 tablet; Refill: 3  3. Overweight (BMI 25.0-29.9) Commended her that she has started walking again.  Dietary counseling given.     Patient was given the opportunity to ask questions.  Patient verbalized understanding of the plan and was able to repeat key elements of the plan.   This documentation was completed using Radio producer.  Any transcriptional errors are unintentional.  No orders of the defined types were placed in this encounter.    Requested Prescriptions    No prescriptions requested or ordered in this encounter    No follow-ups  on file.  Karle Plumber, MD, FACP

## 2022-11-20 NOTE — Patient Instructions (Signed)
We have put you back on the Fosamax 35 mg to take once a week for osteopenia which is thinning of the bones.  This medicine should be taken first thing in the morning by itself on an empty stomach with large glass of water.  Sit upright for about 30 minutes after taking the medication.  We have started you on a low-dose of gabapentin 100 mg at bedtime to see if it will help with the tingling in the right hand.

## 2022-11-21 DIAGNOSIS — M25531 Pain in right wrist: Secondary | ICD-10-CM | POA: Diagnosis not present

## 2022-11-24 DIAGNOSIS — M25552 Pain in left hip: Secondary | ICD-10-CM | POA: Diagnosis not present

## 2022-11-24 DIAGNOSIS — M25551 Pain in right hip: Secondary | ICD-10-CM | POA: Diagnosis not present

## 2022-11-28 DIAGNOSIS — M79641 Pain in right hand: Secondary | ICD-10-CM | POA: Diagnosis not present

## 2022-12-01 DIAGNOSIS — M25552 Pain in left hip: Secondary | ICD-10-CM | POA: Diagnosis not present

## 2022-12-01 DIAGNOSIS — M25551 Pain in right hip: Secondary | ICD-10-CM | POA: Diagnosis not present

## 2022-12-06 DIAGNOSIS — M79641 Pain in right hand: Secondary | ICD-10-CM | POA: Diagnosis not present

## 2022-12-08 ENCOUNTER — Ambulatory Visit: Payer: Medicare HMO | Attending: Internal Medicine

## 2022-12-08 DIAGNOSIS — Z0000 Encounter for general adult medical examination without abnormal findings: Secondary | ICD-10-CM | POA: Diagnosis not present

## 2022-12-08 DIAGNOSIS — M25551 Pain in right hip: Secondary | ICD-10-CM | POA: Diagnosis not present

## 2022-12-08 DIAGNOSIS — M25552 Pain in left hip: Secondary | ICD-10-CM | POA: Diagnosis not present

## 2022-12-08 NOTE — Progress Notes (Signed)
Subjective:   Krystal Moore is a 89 y.o. female who presents for Medicare Annual (Subsequent) preventive examination.  Review of Systems    connected with  Krystal Moore on  at  by telephone and verified that I am speaking with the correct person using two identifiers. I discussed the limitations, risks, security and privacy concerns of performing an evaluation and management service by telephone and the availability of in person appointments. I also discussed with the patient that there may be a patient responsible charge related to this service. The patient expressed understanding and agreed to proceed.  Patient location:  home  My Location: Community health and wellness  Persons on the telephone call:   Myself [facility] ) and Krystal Moore        Objective:    There were no vitals filed for this visit. There is no height or weight on file to calculate BMI.     12/08/2022   10:33 AM 06/08/2022   10:38 AM 06/07/2022    2:20 PM 06/07/2022   11:45 AM 07/27/2021    6:59 PM 01/19/2021    9:49 AM 10/13/2020    9:58 AM  Advanced Directives  Does Patient Have a Medical Advance Directive? Yes Yes Yes No No Yes No  Type of Advance Directive  Living will [place];Living will   [place]   Does patient want to make changes to medical advance directive?  No - Patient declined No - Patient declined      Copy of [place] in Chart?   No - copy requested      Would patient like information on creating a medical advance directive?   No - Patient declined No - Patient declined No - Patient declined      Current Medications (verified) Outpatient Encounter Medications as of 12/08/2022  Medication Sig   calcium carbonate (OS-CAL - DOSED IN MG OF ELEMENTAL CALCIUM) 1250 (500 Ca) MG tablet Take 1 tablet by mouth daily.   Cholecalciferol (VITAMIN D3) 125 MCG (5000 UT) CAPS Take 1 capsule by mouth daily.    cyanocobalamin (VITAMIN B12) 1000 MCG tablet Take 3,000 mcg by mouth daily.   furosemide (LASIX) 20 MG tablet TAKE 1 TABLET BY MOUTH EVERY DAY FOR 5 DAYS THEN 1 EVERY DAY AS NEEDED FOR SWELLING   gabapentin (NEURONTIN) 100 MG capsule Take 1 capsule (100 mg total) by mouth at bedtime.   Krill Oil 1000 MG CAPS Take 1,000 mg by mouth daily.   levothyroxine (SYNTHROID) 112 MCG tablet TAKE 1 TABLET BY MOUTH EVERY DAY BEFORE BREAKFAST   senna (SENOKOT) 8.6 MG TABS tablet Take 1 tablet (8.6 mg total) by mouth 2 (two) times daily.   tiZANidine (ZANAFLEX) 4 MG tablet Take 1 tablet (4 mg total) by mouth 2 (two) times daily as needed for muscle spasms.   alendronate (FOSAMAX) 35 MG tablet Take 1 tablet (35 mg total) by mouth every 7 (seven) days. Take with a full glass of water on an empty stomach.   lidocaine (LIDODERM) 5 % Place 1 patch onto the skin every 12 (twelve) hours. Remove & Discard patch within 12 hours or as directed by MD Apply on the painful left lower back   No facility-administered encounter medications on file as of 12/08/2022.    Allergies (verified) Elemental sulfur, Milk-related compounds, Poison ivy extract [poison ivy extract], Tobacco [tobacco], Cymbalta [duloxetine hcl], and Orange juice [orange oil]   History: Past Medical History:  Diagnosis Date   Anemia    Narcolepsy    Neuropathy    Obesity    Pneumonia    Polyneuropathy in other diseases classified elsewhere [facility])    Shoulder fracture, left    Past Surgical History:  Procedure Laterality Date   CATARACT EXTRACTION Left 06/2019   INTRAMEDULLARY (IM) NAIL INTERTROCHANTERIC Right 06/08/2022   Procedure: INTRAMEDULLARY (IM) NAIL INTERTROCHANTERIC;  Surgeon: Rod Can, MD;  Location: [facility];  Service: Orthopedics;  Laterality: Right;   MOUTH SURGERY     NASAL SINUS SURGERY     Family History  Problem Relation Age of Onset   Stroke Mother        questionable   Clotting disorder Mother    Heart attack Father     Social History   Socioeconomic History   Marital status: Widowed    Spouse name: Not on file   Number of children: 0   Years of education: college   Highest education level: Not on file  Occupational History   Not on file  Tobacco Use   Smoking status: Never   Smokeless tobacco: Never  Substance and Sexual Activity   Alcohol use: Yes    Comment: 1/4 glass per day   Drug use: No   Sexual activity: Not Currently  Other Topics Concern   Not on file  Social History Narrative   ** Merged History Encounter **       07/30/19 Patient lives at home and she has a roommate.  Has a poodle.  Patient works part time Advertising copywriter. Patient has a college education. Both handed. Plays organ since age 69.  Caffeine- None    Social Determinants of Health   Financial Resource Strain: Low Risk  (12/08/2022)   Overall Financial Resource Strain (CARDIA)    Difficulty of Paying Living Expenses: Not hard at all  Food Insecurity: No Food Insecurity (12/08/2022)   Hunger Vital Sign    Worried About Running Out of Food in the Last Year: Never true    Ran Out of Food in the Last Year: Never true  Transportation Needs: No Transportation Needs (12/08/2022)   PRAPARE - Hydrologist (Medical): No    Lack of Transportation (Non-Medical): No  Physical Activity: Insufficiently Active (12/08/2022)   Exercise Vital Sign    Days of Exercise per Week: 7 days    Minutes of Exercise per Session: 10 min  Stress: No Stress Concern Present (12/08/2022)   [place]    Feeling of Stress : Only a little  Social Connections: Moderately Integrated (12/08/2022)   Social Connection and Isolation Panel [NHANES]    Frequency of Communication with Friends and Family: More than three times a week    Frequency of Social Gatherings with Friends and Family: More than three times a week    Attends Religious Services: More than  4 times per year    Active Member of [employer] or Organizations: Yes    Attends Archivist Meetings: More than 4 times per year    Marital Status: Widowed    Tobacco Counseling Counseling given: Not Answered   Clinical Intake:     Pain : No/denies pain     Diabetes: No     Diabetic?no          Activities of Daily Living    12/08/2022   10:33 AM 06/07/2022    2:24 PM  In your present  state of health, do you have any difficulty performing the following activities:  Hearing? 0   Vision? 0   Difficulty concentrating or making decisions? 0   Walking or climbing stairs? 0   Dressing or bathing? 0   Doing errands, shopping? 1 1  Comment  new problem, pt usually drives and runs errands by herself  Preparing Food and eating ? N   Using the Toilet? N   In the past six months, have you accidently leaked urine? Y   Do you have problems with loss of bowel control? N   Managing your Medications? N   Managing your Finances? N   Housekeeping or managing your Housekeeping? Y     Patient Care Team: Ladell Pier, MD as PCP - General (Internal Medicine) Kathrynn Ducking, MD (Inactive) as Consulting Physician (Neurology)  Indicate any recent Medical Services you may have received from other than [facility] providers in the past year (date may be approximate).     Assessment:   This is a routine wellness examination for Krystal Moore.  Hearing/Vision screen No results found.  Dietary issues and exercise activities discussed:     Goals Addressed   None   Depression Screen    11/20/2022   11:35 AM 07/21/2022    1:47 PM 12/28/2021   10:54 AM 08/04/2021   11:01 AM 02/09/2021    2:38 PM 05/29/2019    9:37 AM 02/19/2019    3:47 PM  PHQ 2/9 Scores  PHQ - 2 Score 0 0 0 4 2 0 0  PHQ- 9 Score 1   15 4  $ 0    Fall Risk    12/08/2022   10:33 AM 11/20/2022   11:35 AM 07/21/2022    1:47 PM 12/28/2021   10:54 AM 12/20/2020    4:13 PM  Fall Risk   Falls in the past year? 0 1 1 0  1  Number falls in past yr: 0  0 0 0  Injury with Fall? 0 1 1 0 1  Risk for fall due to : No Fall Risks History of fall(s) History of fall(s) No Fall Risks     FALL RISK PREVENTION PERTAINING TO THE HOME:  Any stairs in or around the home? Yes  If so, are there any without handrails? Yes  Home free of loose throw rugs in walkways, pet beds, electrical cords, etc? Yes  Adequate lighting in your home to reduce risk of falls? Yes   ASSISTIVE DEVICES UTILIZED TO PREVENT FALLS:  Life alert? No  Use of a cane, walker or w/c? No  Grab bars in the bathroom? Yes  Shower chair or bench in shower? Yes  Elevated toilet seat or a handicapped toilet? No   TIMED UP AND GO:  Was the test performed? No .  Length of time to ambulate 10 feet:  sec.   Gait slow and steady without use of assistive device  Cognitive Function:    12/08/2022   10:35 AM  MMSE - Mini Mental State Exam  Orientation to time 5  Orientation to Place 5  Registration 3  Attention/ Calculation 5  Recall 3  Language- name 2 objects 2  Language- repeat 1  Language- follow 3 step command 3  Language- read & follow direction 1  Write a sentence 1  Copy design 1  Total score 30        12/08/2022   10:37 AM  6CIT Screen  What Year? 0 points  What month?  0 points  What time? 0 points  Count back from 20 0 points  Repeat phrase 0 points    Immunizations Immunization History  Administered Date(s) Administered   Fluad Quad(high Dose 65+) 08/12/2019, 07/21/2022   Influenza, High Dose Seasonal PF 10/15/2018   Influenza,inj,Quad PF,6+ Mos 10/11/2015, 06/08/2016, 08/03/2017, 08/04/2021   PFIZER Comirnaty(Gray Top)Covid-19 Tri-Sucrose Vaccine 11/16/2020   PFIZER(Purple Top)SARS-COV-2 Vaccination 12/14/2019, 01/07/2020   Pneumococcal Conjugate (Pcv15) 07/21/2022   Pneumococcal Polysaccharide-23 11/21/2016   Tdap 07/28/2014, 11/29/2020   Zoster Recombinat (Shingrix) 08/12/2019, 01/31/2020    TDAP status: Up to  date  Flu Vaccine status: Up to date  Pneumococcal vaccine status: Up to date  Covid-19 vaccine status: Information provided on how to obtain vaccines.   Qualifies for Shingles Vaccine? Yes   Zostavax completed Yes   Shingrix Completed?: Yes  Screening Tests Health Maintenance  Topic Date Due   COVID-19 Vaccine (4 - 2023-24 season) 06/23/2022   Medicare Annual Wellness (AWV)  12/09/2023   DTaP/Tdap/Td (3 - Td or Tdap) 11/29/2030   Pneumonia Vaccine 31+ Years old  Completed   INFLUENZA VACCINE  Completed   DEXA SCAN  Completed   Zoster Vaccines- Shingrix  Completed   HPV VACCINES  Aged Out    Health Maintenance  Health Maintenance Due  Topic Date Due   COVID-19 Vaccine (4 - 2023-24 season) 06/23/2022    Colorectal cancer screening: No longer required.   Mammogram status: No longer required due to age .  Bone Density status: Ordered 11/20/22. Pt provided with contact info and advised to call to schedule appt.  Lung Cancer Screening: (Low Dose CT Chest recommended if Age 78-80 years, 30 pack-year currently smoking OR have quit w/in 15years.) does not qualify.   Lung Cancer Screening Referral: aged out   Additional Screening:  Hepatitis C Screening: does not qualify; Completed aged out   Vision Screening: Recommended annual ophthalmology exams for early detection of glaucoma and other disorders of the eye. Is the patient up to date with their annual eye exam?  No  Who is the provider or what is the name of the office in which the patient attends annual eye exams? N/a If pt is not established with a provider, would they like to be referred to a provider to establish care? No .   Dental Screening: Recommended annual dental exams for proper oral hygiene  Community Resource Referral / Chronic Care Management: CRR required this visit?  No   CCM required this visit?  No      Plan:     I have personally reviewed and noted the following in the patient's chart:    Medical and social history Use of alcohol, tobacco or illicit drugs  Current medications and supplements including opioid prescriptions. Patient is not currently taking opioid prescriptions. Functional ability and status Nutritional status Physical activity Advanced directives List of other physicians Hospitalizations, surgeries, and ER visits in previous 12 months Vitals Screenings to include cognitive, depression, and falls Referrals and appointments  In addition, I have reviewed and discussed with patient certain preventive protocols, quality metrics, and best practice recommendations. A written personalized care plan for preventive services as well as general preventive health recommendations were provided to patient.     Lillie Columbia, CMA   12/08/2022   Nurse Notes:

## 2022-12-11 DIAGNOSIS — M25531 Pain in right wrist: Secondary | ICD-10-CM | POA: Diagnosis not present

## 2022-12-14 DIAGNOSIS — G5601 Carpal tunnel syndrome, right upper limb: Secondary | ICD-10-CM | POA: Diagnosis not present

## 2022-12-15 DIAGNOSIS — M25552 Pain in left hip: Secondary | ICD-10-CM | POA: Diagnosis not present

## 2022-12-15 DIAGNOSIS — M25551 Pain in right hip: Secondary | ICD-10-CM | POA: Diagnosis not present

## 2023-01-03 DIAGNOSIS — M25551 Pain in right hip: Secondary | ICD-10-CM | POA: Diagnosis not present

## 2023-01-03 DIAGNOSIS — M25552 Pain in left hip: Secondary | ICD-10-CM | POA: Diagnosis not present

## 2023-01-04 ENCOUNTER — Other Ambulatory Visit: Payer: Self-pay | Admitting: Internal Medicine

## 2023-01-04 DIAGNOSIS — E039 Hypothyroidism, unspecified: Secondary | ICD-10-CM

## 2023-01-17 DIAGNOSIS — M25552 Pain in left hip: Secondary | ICD-10-CM | POA: Diagnosis not present

## 2023-01-17 DIAGNOSIS — M25551 Pain in right hip: Secondary | ICD-10-CM | POA: Diagnosis not present

## 2023-02-05 DIAGNOSIS — M79641 Pain in right hand: Secondary | ICD-10-CM | POA: Diagnosis not present

## 2023-02-08 DIAGNOSIS — S52531P Colles' fracture of right radius, subsequent encounter for closed fracture with malunion: Secondary | ICD-10-CM | POA: Diagnosis not present

## 2023-02-08 DIAGNOSIS — G5601 Carpal tunnel syndrome, right upper limb: Secondary | ICD-10-CM | POA: Diagnosis not present

## 2023-02-26 DIAGNOSIS — M1990 Unspecified osteoarthritis, unspecified site: Secondary | ICD-10-CM | POA: Diagnosis not present

## 2023-02-26 DIAGNOSIS — M810 Age-related osteoporosis without current pathological fracture: Secondary | ICD-10-CM | POA: Diagnosis not present

## 2023-02-26 DIAGNOSIS — M4800 Spinal stenosis, site unspecified: Secondary | ICD-10-CM | POA: Diagnosis not present

## 2023-02-26 DIAGNOSIS — Z961 Presence of intraocular lens: Secondary | ICD-10-CM | POA: Diagnosis not present

## 2023-02-26 DIAGNOSIS — I872 Venous insufficiency (chronic) (peripheral): Secondary | ICD-10-CM | POA: Diagnosis not present

## 2023-02-26 DIAGNOSIS — Z7983 Long term (current) use of bisphosphonates: Secondary | ICD-10-CM | POA: Diagnosis not present

## 2023-02-26 DIAGNOSIS — I10 Essential (primary) hypertension: Secondary | ICD-10-CM | POA: Diagnosis not present

## 2023-02-26 DIAGNOSIS — E039 Hypothyroidism, unspecified: Secondary | ICD-10-CM | POA: Diagnosis not present

## 2023-02-26 DIAGNOSIS — Z9181 History of falling: Secondary | ICD-10-CM | POA: Diagnosis not present

## 2023-02-26 DIAGNOSIS — R269 Unspecified abnormalities of gait and mobility: Secondary | ICD-10-CM | POA: Diagnosis not present

## 2023-02-26 DIAGNOSIS — H3530 Unspecified macular degeneration: Secondary | ICD-10-CM | POA: Diagnosis not present

## 2023-02-26 DIAGNOSIS — G629 Polyneuropathy, unspecified: Secondary | ICD-10-CM | POA: Diagnosis not present

## 2023-03-22 ENCOUNTER — Ambulatory Visit: Payer: Medicare HMO | Attending: Internal Medicine | Admitting: Internal Medicine

## 2023-03-22 ENCOUNTER — Encounter: Payer: Self-pay | Admitting: Internal Medicine

## 2023-03-22 VITALS — BP 162/78 | HR 69 | Temp 98.1°F | Ht 63.0 in | Wt 158.0 lb

## 2023-03-22 DIAGNOSIS — R030 Elevated blood-pressure reading, without diagnosis of hypertension: Secondary | ICD-10-CM

## 2023-03-22 DIAGNOSIS — E039 Hypothyroidism, unspecified: Secondary | ICD-10-CM | POA: Diagnosis not present

## 2023-03-22 DIAGNOSIS — R202 Paresthesia of skin: Secondary | ICD-10-CM | POA: Diagnosis not present

## 2023-03-22 DIAGNOSIS — M8580 Other specified disorders of bone density and structure, unspecified site: Secondary | ICD-10-CM | POA: Diagnosis not present

## 2023-03-22 DIAGNOSIS — Z780 Asymptomatic menopausal state: Secondary | ICD-10-CM

## 2023-03-22 DIAGNOSIS — M5416 Radiculopathy, lumbar region: Secondary | ICD-10-CM | POA: Diagnosis not present

## 2023-03-22 DIAGNOSIS — R269 Unspecified abnormalities of gait and mobility: Secondary | ICD-10-CM

## 2023-03-22 MED ORDER — LEVOTHYROXINE SODIUM 112 MCG PO TABS
ORAL_TABLET | ORAL | 1 refills | Status: DC
Start: 2023-03-22 — End: 2023-11-29

## 2023-03-22 NOTE — Progress Notes (Signed)
Patient ID: Krystal Moore, female    DOB: October 29, 1929  MRN: [id number]  CC: Hypothyroidism (Hypothyroidism. /Intermittent pain & burning sensation on L leg - balance issues/Lack of sensation of R hand)   Subjective: Krystal Moore is a 89 y.o. female who presents for chronic ds management Her concerns today include:  Hx of osteopenia on Fosamax, hypothyroid, obesity, Vit B12 def, RT CTS, OA cervical spine, lumbar spinal stenosis,    Still has issues with RT hand with decrease sensation.  Told by ortho that their is nothing more they can do.  Did find the P.T helpful.  Continues to play piano.  Gabapentin helps her sleep at night but really has not helped much with her hand.  She takes it as needed instead of every night.  Osteopenia: Bone density study scheduled for July.  She is taking and tolerating the Fosamax.  Blood pressure noted to be elevated today.  Patient feels it is elevated because she is all excited talking about her current issues.  Blood pressure on last visit was normal.  Reports feeling off balance when she walks because of a heavy and hot feeling in the left anterior thigh.  This has been present since 2 years ago when she injured her back.  MRI at that time had showed disc extrusion at L2-3 with severe left lateral recess and neural foraminal stenosis.  She also had moderate to severe spinal stenosis in the remaining lumbar spine.  She had seen [place] [facility] at the time.  She takes tizanidine a few times a month when she gets spasms in the left thigh. She has not had any falls but states she tries to be very careful when she walks to avoid falling.  She walks with her puppy twice a day for blocks total. Wears orthopedic tennis shoes that are very comfortable.  Hypothyroidism: Reports compliance with taking levothyroxine 112 mcg daily.   Patient Active Problem List   Diagnosis Date Noted   CTS (carpal tunnel syndrome) 08/16/2022   Closed  right hip fracture (HCC) 06/07/2022   Fall at home, initial encounter 06/07/2022   Numbness 07/30/2019   Spinal stenosis of lumbar region 07/30/2019   Osteopenia after menopause 05/29/2019   Over weight 05/29/2019   Elevated blood pressure reading 05/29/2019   Immunization due 08/03/2017   Left leg swelling 05/04/2017   Allergic rhinitis 10/11/2015   Hypothyroidism 12/10/2013   Peripheral neuropathy 05/16/2013     Current Outpatient Medications on File Prior to Visit  Medication Sig Dispense Refill   alendronate (FOSAMAX) 35 MG tablet Take 1 tablet (35 mg total) by mouth every 7 (seven) days. Take with a full glass of water on an empty stomach. 4 tablet 3   calcium carbonate (OS-CAL - DOSED IN MG OF ELEMENTAL CALCIUM) 1250 (500 Ca) MG tablet Take 1 tablet by mouth daily.     Cholecalciferol (VITAMIN D3) 125 MCG (5000 UT) CAPS Take 1 capsule by mouth daily.     cyanocobalamin (VITAMIN B12) 1000 MCG tablet Take 3,000 mcg by mouth daily.     furosemide (LASIX) 20 MG tablet TAKE 1 TABLET BY MOUTH EVERY DAY FOR 5 DAYS THEN 1 EVERY DAY AS NEEDED FOR SWELLING 10 tablet 0   Krill Oil 1000 MG CAPS Take 1,000 mg by mouth daily.     senna (SENOKOT) 8.6 MG TABS tablet Take 1 tablet (8.6 mg total) by mouth 2 (two) times daily. 120 tablet 0   tiZANidine (ZANAFLEX) 4 MG tablet Take  1 tablet (4 mg total) by mouth 2 (two) times daily as needed for muscle spasms. 30 tablet 1   gabapentin (NEURONTIN) 100 MG capsule Take 1 capsule (100 mg total) by mouth at bedtime. (Patient not taking: Reported on 03/22/2023) 30 capsule 3   lidocaine (LIDODERM) 5 % Place 1 patch onto the skin every 12 (twelve) hours. Remove & Discard patch within 12 hours or as directed by MD Apply on the painful left lower back 10 patch 0   No current facility-administered medications on file prior to visit.    Allergies  Allergen Reactions   Elemental Sulfur Shortness Of Breath and Swelling   Milk-Related Compounds Nausea And  Vomiting   Poison Ivy Extract [Poison Ivy Extract] Itching, Rash and Other (See Comments)    Muscle damage    Tobacco [Tobacco] Anaphylaxis and Swelling    Allergic to cigarette smoke   Cymbalta [Duloxetine Hcl] Diarrhea   Orange Juice [Orange Oil] Nausea And Vomiting    Social History   Socioeconomic History   Marital status: Widowed    Spouse name: Not on file   Number of children: 0   Years of education: college   Highest education level: Not on file  Occupational History   Not on file  Tobacco Use   Smoking status: Never   Smokeless tobacco: Never  Substance and Sexual Activity   Alcohol use: Yes    Comment: 1/4 glass per day   Drug use: No   Sexual activity: Not Currently  Other Topics Concern   Not on file  Social History Narrative   ** Merged History Encounter **       07/30/19 Patient lives at home and she has a roommate.  Has a poodle.  Patient works part time Programmer, systems. Patient has a college education. Both handed. Plays organ since age 16.  Caffeine- None    Social Determinants of Health   Financial Resource Strain: Low Risk  (12/08/2022)   Overall Financial Resource Strain (CARDIA)    Difficulty of Paying Living Expenses: Not hard at all  Food Insecurity: No Food Insecurity (12/08/2022)   Hunger Vital Sign    Worried About Running Out of Food in the Last Year: Never true    Ran Out of Food in the Last Year: Never true  Transportation Needs: No Transportation Needs (12/08/2022)   PRAPARE - Administrator, Civil Service (Medical): No    Lack of Transportation (Non-Medical): No  Physical Activity: Insufficiently Active (12/08/2022)   Exercise Vital Sign    Days of Exercise per Week: 7 days    Minutes of Exercise per Session: 10 min  Stress: No Stress Concern Present (12/08/2022)   [employer] of Occupational Health - Occupational Stress Questionnaire    Feeling of Stress : Only a little  Social Connections: Moderately Integrated  (12/08/2022)   Social Connection and Isolation Panel [NHANES]    Frequency of Communication with Friends and Family: More than three times a week    Frequency of Social Gatherings with Friends and Family: More than three times a week    Attends Religious Services: More than 4 times per year    Active Member of [employer] or Organizations: Yes    Attends Banker Meetings: More than 4 times per year    Marital Status: Widowed  Intimate Partner Violence: Not on file    Family History  Problem Relation Age of Onset   Stroke Mother  questionable   Clotting disorder Mother    Heart attack Father     Past Surgical History:  Procedure Laterality Date   CATARACT EXTRACTION Left 06/2019   INTRAMEDULLARY (IM) NAIL INTERTROCHANTERIC Right 06/08/2022   Procedure: INTRAMEDULLARY (IM) NAIL INTERTROCHANTERIC;  Surgeon: Samson Frederic, MD;  Location: [facility];  Service: Orthopedics;  Laterality: Right;   MOUTH SURGERY     NASAL SINUS SURGERY      ROS: Review of Systems Negative except as stated above  PHYSICAL EXAM: BP (!) 162/78 (BP Location: Left Arm, Patient Position: Sitting, Cuff Size: Normal)   Pulse 69   Temp 98.1 F (36.7 C) (Oral)   Ht 5\' 3"  (1.6 m)   Wt 158 lb (71.7 kg)   SpO2 98%   BMI 27.99 kg/m   Physical Exam BP 169/81 General appearance - alert, well appearing, elderly Caucasian female and in no distress Mental status - normal mood, behavior, speech, dress, motor activity, and thought processes Chest - clear to auscultation, no wheezes, rales or rhonchi, symmetric air entry Heart - normal rate, regular rhythm, normal S1, S2, no murmurs, rubs, clicks or gallops Musculoskeletal -power in both lower extremities 5/5 bilaterally proximally and distally. Gait: Ambulates unassisted.  She has a low foot to floor clearance.  She has a stooped posture with walking and tends to look down instead of straight ahead.  Slightly off balance when she turns. Extremities -no  lower extremity edema.      Latest Ref Rng & Units 06/11/2022    6:29 AM 06/10/2022    3:53 AM 06/09/2022    3:39 AM  CMP  Glucose 70 - 99 mg/dL 98  161  096   BUN 8 - 23 mg/dL 8  12  13    Creatinine 0.44 - 1.00 mg/dL 0.45  4.09  8.11   Sodium 135 - 145 mmol/L 137  134  134   Potassium 3.5 - 5.1 mmol/L 4.1  4.0  3.8   Chloride 98 - 111 mmol/L 107  103  104   CO2 22 - 32 mmol/L 26  25  24    Calcium 8.9 - 10.3 mg/dL 9.1  9.0  8.8    Lipid Panel     Component Value Date/Time   CHOL 233 (H) 05/29/2019 1127   TRIG 55 05/29/2019 1127   HDL 90 05/29/2019 1127   CHOLHDL 2.6 05/29/2019 1127   CHOLHDL 3.1 06/08/2016 1155   VLDL 20 06/08/2016 1155   LDLCALC 132 (H) 05/29/2019 1127    CBC    Component Value Date/Time   WBC 5.3 07/21/2022 1445   WBC 5.4 06/11/2022 0629   RBC 4.28 07/21/2022 1445   RBC 3.66 (L) 06/11/2022 0629   HGB 13.4 07/21/2022 1445   HGB 13.5 06/07/2015 1240   HCT 38.4 07/21/2022 1445   HCT 41.0 06/07/2015 1240   PLT 297 07/21/2022 1445   MCV 90 07/21/2022 1445   MCV 91.9 06/07/2015 1240   MCH 31.3 07/21/2022 1445   MCH 31.4 06/11/2022 0629   MCHC 34.9 07/21/2022 1445   MCHC 33.1 06/11/2022 0629   RDW 12.6 07/21/2022 1445   RDW 13.6 06/07/2015 1240   LYMPHSABS 1.6 06/07/2022 1149   LYMPHSABS 1.5 06/07/2015 1240   MONOABS 0.3 06/07/2022 1149   MONOABS 0.4 06/07/2015 1240   EOSABS 0.2 06/07/2022 1149   EOSABS 0.2 06/07/2015 1240   BASOSABS 0.0 06/07/2022 1149   BASOSABS 0.1 06/07/2015 1240    ASSESSMENT AND PLAN: 1. Left lumbar radiculopathy  Discussed the heavy feeling and burning that she has in the left anterior thigh is most likely coming from her back.  She agrees for check in with the neurosurgeon.   - Ambulatory referral to Neurosurgery  2. Gait disturbance I think she would benefit from some physical therapy for gait safety training.  In the meantime, I advised her to use her cane which she has at home but has not had to use it. -  Ambulatory referral to Physical Therapy  3. Right hand paresthesia No further management offered by orthopedics at this time.  Patient states she was told to continue playing the piano is the best physical therapy that she can continue to work and not lose range of motion in her right hand.  4. Osteopenia after menopause Continue Fosamax.  Keep appointment for bone density study  5. Hypothyroidism, unspecified type - TSH - levothyroxine (SYNTHROID) 112 MCG tablet; TAKE 1 TABLET BY MOUTH EVERY DAY BEFORE BREAKFAST  Dispense: 90 tablet; Refill: 1  6. Elevated blood pressure reading DASH diet discussed and encouraged.  Will have her follow-up in 2 weeks for repeat blood pressure check. - CBC - Comprehensive metabolic panel     Patient was given the opportunity to ask questions.  Patient verbalized understanding of the plan and was able to repeat key elements of the plan.   This documentation was completed using Paediatric nurse.  Any transcriptional errors are unintentional.  Orders Placed This Encounter  Procedures   CBC   Comprehensive metabolic panel   TSH   Ambulatory referral to Neurosurgery   Ambulatory referral to Physical Therapy     Requested Prescriptions   Signed Prescriptions Disp Refills   levothyroxine (SYNTHROID) 112 MCG tablet 90 tablet 1    Sig: TAKE 1 TABLET BY MOUTH EVERY DAY BEFORE BREAKFAST    Return in about 2 weeks (around 04/05/2023) for for BP recheck.  Jonah Blue, MD, FACP

## 2023-03-23 LAB — COMPREHENSIVE METABOLIC PANEL
ALT: 11 IU/L (ref 0–32)
AST: 19 IU/L (ref 0–40)
Albumin/Globulin Ratio: 1.7 (ref 1.2–2.2)
Albumin: 4.3 g/dL (ref 3.6–4.6)
Alkaline Phosphatase: 72 IU/L (ref 44–121)
BUN/Creatinine Ratio: 18 (ref 12–28)
BUN: 16 mg/dL (ref 10–36)
Bilirubin Total: 0.4 mg/dL (ref 0.0–1.2)
CO2: 21 mmol/L (ref 20–29)
Calcium: 10 mg/dL (ref 8.7–10.3)
Chloride: 102 mmol/L (ref 96–106)
Creatinine, Ser: 0.89 mg/dL (ref 0.57–1.00)
Globulin, Total: 2.5 g/dL (ref 1.5–4.5)
Glucose: 78 mg/dL (ref 70–99)
Potassium: 4.4 mmol/L (ref 3.5–5.2)
Sodium: 139 mmol/L (ref 134–144)
Total Protein: 6.8 g/dL (ref 6.0–8.5)
eGFR: 62 mL/min/{1.73_m2} (ref >59)

## 2023-03-23 LAB — CBC
Hematocrit: 40.6 % (ref 34.0–46.6)
Hemoglobin: 13.8 g/dL (ref 11.1–15.9)
MCH: 30.7 pg (ref 26.6–33.0)
MCHC: 34 g/dL (ref 31.5–35.7)
MCV: 90 fL (ref 79–97)
Platelets: 220 10*3/uL (ref 150–450)
RBC: 4.5 x10E6/uL (ref 3.77–5.28)
RDW: 12.8 % (ref 11.7–15.4)
WBC: 5.3 10*3/uL (ref 3.4–10.8)

## 2023-03-23 LAB — TSH: TSH: 2.99 u[IU]/mL (ref 0.450–4.500)

## 2023-03-27 NOTE — Therapy (Signed)
OUTPATIENT PHYSICAL THERAPY LOWER EXTREMITY EVALUATION   Patient Name: Krystal Moore MRN: [id number] DOB:[birth year], 89 y.o., female Today's Date: 03/29/2023  END OF SESSION:  PT End of Session - 03/28/23 1156     Visit Number 1    Number of Visits 17    Date for PT Re-Evaluation 06/01/23    Authorization Type [employer] MEDICARE HMO/PPO    PT Start Time 1150    PT Stop Time 1235    PT Time Calculation (min) 45 min    Activity Tolerance Patient tolerated treatment well    Behavior During Therapy WFL for tasks assessed/performed             Past Medical History:  Diagnosis Date   Anemia    Narcolepsy    Neuropathy    Obesity    Pneumonia    Polyneuropathy in other diseases classified elsewhere [facility])    Shoulder fracture, left    Past Surgical History:  Procedure Laterality Date   CATARACT EXTRACTION Left 06/2019   INTRAMEDULLARY (IM) NAIL INTERTROCHANTERIC Right 06/08/2022   Procedure: INTRAMEDULLARY (IM) NAIL INTERTROCHANTERIC;  Surgeon: Samson Frederic, MD;  Location: [facility];  Service: Orthopedics;  Laterality: Right;   MOUTH SURGERY     NASAL SINUS SURGERY     Patient Active Problem List   Diagnosis Date Noted   CTS (carpal tunnel syndrome) 08/16/2022   Closed right hip fracture (HCC) 06/07/2022   Fall at home, initial encounter 06/07/2022   Numbness 07/30/2019   Spinal stenosis of lumbar region 07/30/2019   Osteopenia after menopause 05/29/2019   Over weight 05/29/2019   Elevated blood pressure reading 05/29/2019   Immunization due 08/03/2017   Left leg swelling 05/04/2017   Allergic rhinitis 10/11/2015   Hypothyroidism 12/10/2013   Peripheral neuropathy 05/16/2013    PCP: Marcine Matar, MD   REFERRING PROVIDER: Marcine Matar, MD   REFERRING DIAG: R26.9 (ICD-10-CM) - Gait disturbance   THERAPY DIAG:  Difficulty in walking, not elsewhere classified  Muscle weakness (generalized)  Rationale for Evaluation and Treatment:  Rehabilitation  ONSET DATE: 10 months  SUBJECTIVE:   SUBJECTIVE STATEMENT: Pt reports a decline in her balance over the past 10 months. In August of 2023, she fell and broke her R hip. Additionally, 1.5 years ago, she was struck by a car door on her L low back and developed chronic L ant thigh pain. Pt notes she uses icey/hot patches and tizanidine to manage the L thigh pain. She endorses the L leg feels heavy and she feels off balanced. Pt reports being very active with riding an adult trike, walking her dog, and gardening.  PERTINENT HISTORY: High BMI, R hip fx- IM nail repair 06/08/22 PAIN:  Are you having pain? Yes: NPRS scale: 4-5/10 Pain location: R hand and L thigh Pain description: N/T and burning Aggravating factors: night time Relieving factors: icey/hot patches; pain medication, muscle relaxers  PRECAUTIONS: Fall  WEIGHT BEARING RESTRICTIONS: No  FALLS:  Has patient fallen in last 6 months? Yes. Number of falls 1 Walking on an uneven grass surface  LIVING ENVIRONMENT: Lives with: lives with an adult companion Lives in: House/apartment Stairs: Yes: Internal: 8 steps; on left going up Has following equipment at home: Single point cane  OCCUPATION: Financial services- part time  PLOF: Independent  PATIENT GOALS: To improve my balance and safety  NEXT MD VISIT: To be scheduled  OBJECTIVE:   DIAGNOSTIC FINDINGS:  DG hip 06/09/23 IMPRESSION: Post ORIF of proximal RIGHT femur.  DG Knee 06/08/23 IMPRESSION: No acute fracture or dislocation.  PATIENT SURVEYS:  FOTO: Perceived function   48%, predicted   54%   COGNITION: Overall cognitive status: Within functional limits for tasks assessed     SENSATION: Light touch: Impaired L ant thigh  EDEMA:  NT  MUSCLE LENGTH: Hamstrings: Right NT deg; Left NT deg Maisie Fus test: Right NT deg; Left NT deg  POSTURE: rounded shoulders, forward head, decreased lumbar lordosis, increased thoracic kyphosis, and flexed  trunk   LOWER EXTREMITY ROM: Grossly WNLs Active ROM Right eval Left eval  Hip flexion    Hip extension    Hip abduction    Hip adduction    Hip internal rotation    Hip external rotation    Knee flexion    Knee extension    Ankle dorsiflexion    Ankle plantarflexion    Ankle inversion    Ankle eversion     (Blank rows = not tested)  LOWER EXTREMITY MMT:  MMT Right eval Left eval  Hip flexion 4+ 4  Hip extension 3- 3-  Hip abduction 4 4-  Hip adduction 4 4+  Hip internal rotation 4+ 4+  Hip external rotation 4 4-  Knee flexion 5 5  Knee extension 5 5  Ankle dorsiflexion 5 5  Ankle plantarflexion 4+ 4+  Ankle inversion    Ankle eversion     (Blank rows = not tested)  LOWER EXTREMITY SPECIAL TESTS:  NT  FUNCTIONAL TESTS:  5 times sit to stand: 11.8 WNLs s use of hands 2 minute walk test: 351ft Standing narrow: 10 sec declined Standing EC, normal base: 10 sec WNLs Single leg stance: 2" each  GAIT: Distance walked: 340' Assistive device utilized: None Level of assistance: Complete Independence Comments: Somewhat short of breath   TODAY'S TREATMENT:                                                                                                                               [facility] Adult PT Treatment:                                                DATE: 03/28/23 Therapeutic Exercise: Developed, instructed in, and pt completed therex as noted in HEP   PATIENT EDUCATION:  Education details: Eval findings, POC, HEP, self care  Person educated: Patient Education method: Explanation, Demonstration, Tactile cues, Verbal cues, and Handouts Education comprehension: verbalized understanding, returned demonstration, verbal cues required, and tactile cues required  HOME EXERCISE PROGRAM: Access Code: [facility] URL: [email or web address] Date: 03/28/2023 Prepared by: Joellyn Rued  Exercises - Standing Hip Abduction with Counter Support  - 1 x daily - 7  x weekly - 2 sets - 10 reps - 2 hold - Standing Single Leg Stance with Counter Support  - 1 x daily -  7 x weekly - 1 sets - 3 reps - 20 hold - Sit to Stand Without Arm Support  - 1 x daily - 7 x weekly - 2 sets - 10 reps - 2 hold  ASSESSMENT:  CLINICAL IMPRESSION: Patient is a 89 y.o. female who was seen today for physical therapy evaluation and treatment for R26.9 (ICD-10-CM) - Gait disturbance. Pt presents with decreased bilat hip strength L>R and decreased narrow base or single leg balance. Pt will benefit from skilled PT 2w8 to address impairments to optimize balance, functional mobility, and safety.    OBJECTIVE IMPAIRMENTS: decreased activity tolerance, decreased balance, difficulty walking, decreased strength, postural dysfunction, obesity, and pain.   ACTIVITY LIMITATIONS: carrying, lifting, bending, standing, squatting, and locomotion level  PARTICIPATION LIMITATIONS: meal prep, cleaning, laundry, shopping, community activity, and yard work  PERSONAL FACTORS: Age, Fitness, Past/current experiences, Time since onset of injury/illness/exacerbation, and 1-2 comorbidities: High BMI, R hip fx- IM nail repair 06/08/22  are also affecting patient's functional outcome.   REHAB POTENTIAL: Good  CLINICAL DECISION MAKING: Evolving/moderate complexity  EVALUATION COMPLEXITY: Moderate   GOALS:  SHORT TERM GOALS: Target date: 04/27/23 Pt will be Ind in an initial HEP  Baseline: started Goal status: INITIAL  LONG TERM GOALS: Target date: 06/01/23  Pt will be Ind in a final HEP to maintain achieved LOF  Baseline: started Goal status: INITIAL  2.  Improve by MCID of 56ft as indication of improved functional mobility  Baseline: 356ft Goal status: INITIAL  3.  Increased bilat hip ext strength to 3+ and abd, ER to 4+ for improved balance and functional mobility Baseline: see flow sheets Goal status: INITIAL  4.  Pt's Berg balance test will increase by 5 points as indication of  improved balance Baseline: TBA Goal status: INITIAL  5.  Pt's FOTO score will improved to the predicted value of 54% as indication of improved function  Baseline: 48% Goal status: INITIAL PLAN:  PT FREQUENCY: 2x/week  PT DURATION: 8 weeks  PLANNED INTERVENTIONS: Therapeutic exercises, Therapeutic activity, Neuromuscular re-education, Balance training, Gait training, Patient/Family education, Self Care, Joint mobilization, Aquatic Therapy, Dry Needling, Cognitive remediation, Cryotherapy, Moist heat, Taping, Ultrasound, Ionotophoresis 4mg /ml Dexamethasone, Manual therapy, and Re-evaluation  PLAN FOR NEXT SESSION: Assess BERG; Review FOTO; assess response to HEP; progress therex as indicated; use of modalities, manual therapy; and TPDN as indicated.   Sharry Beining MS, PT 03/29/23 1:24 PM

## 2023-03-28 ENCOUNTER — Ambulatory Visit: Payer: Medicare HMO | Attending: Internal Medicine

## 2023-03-28 DIAGNOSIS — R262 Difficulty in walking, not elsewhere classified: Secondary | ICD-10-CM | POA: Diagnosis not present

## 2023-03-28 DIAGNOSIS — M6281 Muscle weakness (generalized): Secondary | ICD-10-CM | POA: Diagnosis not present

## 2023-03-28 DIAGNOSIS — R269 Unspecified abnormalities of gait and mobility: Secondary | ICD-10-CM | POA: Insufficient documentation

## 2023-03-29 ENCOUNTER — Other Ambulatory Visit: Payer: Self-pay

## 2023-04-02 NOTE — Therapy (Unsigned)
OUTPATIENT PHYSICAL THERAPY TREATMENT NOTE   Patient Name: Krystal Moore MRN: [id number] DOB:1930/03/23, 89 y.o., female Today's Date: 04/03/2023  PCP: Marcine Matar, MD REFERRING PROVIDER: Marcine Matar, MD  END OF SESSION:   PT End of Session - 04/03/23 1021     Visit Number 2    Number of Visits 17    Date for PT Re-Evaluation 06/01/23    Authorization Type [employer] MEDICARE HMO/PPO    PT Start Time 1017    PT Stop Time 1100    PT Time Calculation (min) 43 min    Activity Tolerance Patient tolerated treatment well    Behavior During Therapy WFL for tasks assessed/performed             Past Medical History:  Diagnosis Date   Anemia    Narcolepsy    Neuropathy    Obesity    Pneumonia    Polyneuropathy in other diseases classified elsewhere [facility])    Shoulder fracture, left    Past Surgical History:  Procedure Laterality Date   CATARACT EXTRACTION Left 06/2019   INTRAMEDULLARY (IM) NAIL INTERTROCHANTERIC Right 06/08/2022   Procedure: INTRAMEDULLARY (IM) NAIL INTERTROCHANTERIC;  Surgeon: Samson Frederic, MD;  Location: [facility];  Service: Orthopedics;  Laterality: Right;   MOUTH SURGERY     NASAL SINUS SURGERY     Patient Active Problem List   Diagnosis Date Noted   CTS (carpal tunnel syndrome) 08/16/2022   Closed right hip fracture (HCC) 06/07/2022   Fall at home, initial encounter 06/07/2022   Numbness 07/30/2019   Spinal stenosis of lumbar region 07/30/2019   Osteopenia after menopause 05/29/2019   Over weight 05/29/2019   Elevated blood pressure reading 05/29/2019   Immunization due 08/03/2017   Left leg swelling 05/04/2017   Allergic rhinitis 10/11/2015   Hypothyroidism 12/10/2013   Peripheral neuropathy 05/16/2013    REFERRING DIAG:  Diagnosis  R26.9 (ICD-10-CM) - Gait disturbance    THERAPY DIAG:  Difficulty in walking, not elsewhere classified  Muscle weakness (generalized)  Rationale for Evaluation and Treatment  Rehabilitation  PERTINENT HISTORY: see above   PRECAUTIONS: falls , balance  SUBJECTIVE:                                                                                                                                                                                      SUBJECTIVE STATEMENT:  Pt reports pain with the exercises. She says she did PT at [facility] for 2 yrs.  LLE tingling and numb , hand.  No pain at rest. She states she walks the pup daily 2 blocks 3 x per day .   PAIN:  Are  you having pain? Yes: NPRS scale: none /10 Pain location: hips when she does her standing LE exercises  Pain description: sore, burning  Aggravating factors: exercises  Relieving factors: rest   OBJECTIVE: (objective measures completed at initial evaluation unless otherwise dated)  DIAGNOSTIC FINDINGS:  DG hip 06/08/22 IMPRESSION: Post ORIF of proximal RIGHT femur.   DG Knee 06/07/22 IMPRESSION: No acute fracture or dislocation.   PATIENT SURVEYS:  FOTO: Perceived function   48%, predicted   54%    COGNITION: Overall cognitive status: Within functional limits for tasks assessed                         SENSATION: Light touch: Impaired L ant thigh   EDEMA:  NT   MUSCLE LENGTH: Hamstrings: Right NT deg; Left NT deg Maisie Fus test: Right NT deg; Left NT deg   POSTURE: rounded shoulders, forward head, decreased lumbar lordosis, increased thoracic kyphosis, and flexed trunk    LOWER EXTREMITY ROM: Grossly WNLs Active ROM Right eval Left eval  Hip flexion      Hip extension      Hip abduction      Hip adduction      Hip internal rotation      Hip external rotation      Knee flexion      Knee extension      Ankle dorsiflexion      Ankle plantarflexion      Ankle inversion      Ankle eversion       (Blank rows = not tested)   LOWER EXTREMITY MMT:   MMT Right eval Left eval  Hip flexion 4+ 4  Hip extension 3- 3-  Hip abduction 4 4-  Hip adduction 4 4+  Hip internal  rotation 4+ 4+  Hip external rotation 4 4-  Knee flexion 5 5  Knee extension 5 5  Ankle dorsiflexion 5 5  Ankle plantarflexion 4+ 4+  Ankle inversion      Ankle eversion       (Blank rows = not tested)   LOWER EXTREMITY SPECIAL TESTS:  NT   FUNCTIONAL TESTS:  5 times sit to stand: 11.8 WNLs s use of hands 2 minute walk test: 334ft Standing narrow: 10 sec declined Standing EC, normal base: 10 sec WNLs Single leg stance: 2" each   GAIT: Distance walked: 340' Assistive device utilized: None Level of assistance: Complete Independence Comments: Somewhat short of breath     TODAY'S TREATMENT:            [facility] Adult PT Treatment:                                                DATE: 04/03/23 Therapeutic Exercise: Nustep L6 UE and LE  7 min  Sit to stand x 10 no UEs  Standing hip abd x 10 each side x 3 sets with cues and explanation   BERG BALANCE  Sitting to Standing: Numbers; 0-4: 4 2. Standing unsupported: Numbers; 0-4: 4 3. Sitting unsupported: Numbers; 0-4: 4 4. Standing to Sitting: Numbers; 0-4: 4              5. Transfers: Numbers; 0-4: 4 6. Standing with eyes closed: Numbers; 0-4: 4 7. Standing with feet together: Numbers; 0-4: 3 8.Reaching forward with outstretched arm: Numbers; 0-4:  3 9. Retrieving object from the floor: Numbers; 0-4: 3 10 Turning to look behind: Numbers; 0-4: 2 11. Turning 360 degrees: Numbers; 0-4: 2 12. Place alternate foot on stool: Numbers; 0-4: 2 13.Standing with one foot in front: Numbers; 0-4: 3 14. Standing on one foot: Numbers; 0-4: 3  Total Score: 46/56  #10 due to spine stiffness with trunk rotation  Self Care: Need for cane, implications of falling, environmental challenges for balance and fall risk                                                                                                                       [facility] Adult PT Treatment:                                                DATE: 03/28/23 Therapeutic  Exercise: Developed, instructed in, and pt completed therex as noted in HEP    PATIENT EDUCATION:  Education details: Eval findings, POC, HEP, self care  Person educated: Patient Education method: Explanation, Demonstration, Tactile cues, Verbal cues, and Handouts Education comprehension: verbalized understanding, returned demonstration, verbal cues required, and tactile cues required   HOME EXERCISE PROGRAM: Access Code: [facility] URL: [email or web address] Date: 03/28/2023 Prepared by: Joellyn Rued   Exercises - Standing Hip Abduction with Counter Support  - 1 x daily - 7 x weekly - 2 sets - 10 reps - 2 hold - Standing Single Leg Stance with Counter Support  - 1 x daily - 7 x weekly - 1 sets - 3 reps - 20 hold - Sit to Stand Without Arm Support  - 1 x daily - 7 x weekly - 2 sets - 10 reps - 2 hold    ASSESSMENT:   CLINICAL IMPRESSION: Patient scoring 46/56 on Berg balance test indicating high risk of fall unless using cane. Her more challenging tasks included turning, rotation and narrow stance.  She is overall quite active and does well but I emphasized the need to use a cane to steady herself when she is walking the dog in particular ue to the unpredictable nature of the task. She seems open to considering it. . Will cont POC emphasizing balance and proprioception but will include posture.   OBJECTIVE IMPAIRMENTS: decreased activity tolerance, decreased balance, difficulty walking, decreased strength, postural dysfunction, obesity, and pain.    ACTIVITY LIMITATIONS: carrying, lifting, bending, standing, squatting, and locomotion level   PARTICIPATION LIMITATIONS: meal prep, cleaning, laundry, shopping, community activity, and yard work   PERSONAL FACTORS: Age, Fitness, Past/current experiences, Time since onset of injury/illness/exacerbation, and 1-2 comorbidities: High BMI, R hip fx- IM nail repair 06/08/22  are also affecting patient's functional outcome.    REHAB  POTENTIAL: Good   CLINICAL DECISION MAKING: Evolving/moderate complexity   EVALUATION COMPLEXITY: Moderate     GOALS:   SHORT TERM GOALS: Target date: 04/27/23 Pt will  be Ind in an initial HEP  Baseline: started Goal status: ongoing    LONG TERM GOALS: Target date: 06/01/23   Pt will be Ind in a final HEP to maintain achieved LOF  Baseline: started Goal status: ongoing   2.  Improve by MCID of 76ft as indication of improved functional mobility  Baseline: 366ft Goal status: ongoing    3.  Increased bilat hip ext strength to 3+ and abd, ER to 4+ for improved balance and functional mobility Baseline: see flow sheets Goal status: ongoing    4.  Pt's Berg balance test will increase by 5 points as indication of improved balance Baseline: 46/56 Goal status: ongoing    5.  Pt's FOTO score will improved to the predicted value of 54% as indication of improved function  Baseline: 48% Goal status: INITIAL PLAN:   PT FREQUENCY: 2x/week   PT DURATION: 8 weeks   PLANNED INTERVENTIONS: Therapeutic exercises, Therapeutic activity, Neuromuscular re-education, Balance training, Gait training, Patient/Family education, Self Care, Joint mobilization, Aquatic Therapy, Dry Needling, Cognitive remediation, Cryotherapy, Moist heat, Taping, Ultrasound, Ionotophoresis 4mg /ml Dexamethasone, Manual therapy, and Re-evaluation   PLAN FOR NEXT SESSION: balance.  Include supine trunk rotation and head turns.  Airex/ more dynamic balance .  assess response to HEP; progress therex as indicated; use of modalities, manual therapy; and TPDN as indicated.    Allen Ralls MS, PT 03/29/23 1:24 PM     Karie Mainland, PT 04/03/23 12:22 PM Phone: [phone] Fax: [phone]   Camdan Burdi, PT 04/03/2023, 10:22 AM

## 2023-04-03 ENCOUNTER — Ambulatory Visit: Payer: Medicare HMO | Admitting: Physical Therapy

## 2023-04-03 ENCOUNTER — Encounter: Payer: Self-pay | Admitting: Physical Therapy

## 2023-04-03 DIAGNOSIS — R269 Unspecified abnormalities of gait and mobility: Secondary | ICD-10-CM | POA: Diagnosis not present

## 2023-04-03 DIAGNOSIS — R262 Difficulty in walking, not elsewhere classified: Secondary | ICD-10-CM | POA: Diagnosis not present

## 2023-04-03 DIAGNOSIS — M6281 Muscle weakness (generalized): Secondary | ICD-10-CM | POA: Diagnosis not present

## 2023-04-04 NOTE — Therapy (Signed)
OUTPATIENT PHYSICAL THERAPY TREATMENT NOTE   Patient Name: Krystal Moore MRN: [id number] DOB:1930-01-13, 89 y.o., female Today's Date: 04/05/2023  PCP: Marcine Matar, MD REFERRING PROVIDER: Marcine Matar, MD  END OF SESSION:   PT End of Session - 04/05/23 1640     Visit Number 3    Number of Visits 17    Date for PT Re-Evaluation 06/01/23    Authorization Type [employer] MEDICARE HMO/PPO    PT Start Time 1633    PT Stop Time 1715    PT Time Calculation (min) 42 min    Activity Tolerance Patient tolerated treatment well    Behavior During Therapy WFL for tasks assessed/performed             Past Medical History:  Diagnosis Date   Anemia    Narcolepsy    Neuropathy    Obesity    Pneumonia    Polyneuropathy in other diseases classified elsewhere [facility])    Shoulder fracture, left    Past Surgical History:  Procedure Laterality Date   CATARACT EXTRACTION Left 06/2019   INTRAMEDULLARY (IM) NAIL INTERTROCHANTERIC Right 06/08/2022   Procedure: INTRAMEDULLARY (IM) NAIL INTERTROCHANTERIC;  Surgeon: Samson Frederic, MD;  Location: [facility];  Service: Orthopedics;  Laterality: Right;   MOUTH SURGERY     NASAL SINUS SURGERY     Patient Active Problem List   Diagnosis Date Noted   CTS (carpal tunnel syndrome) 08/16/2022   Closed right hip fracture (HCC) 06/07/2022   Fall at home, initial encounter 06/07/2022   Numbness 07/30/2019   Spinal stenosis of lumbar region 07/30/2019   Osteopenia after menopause 05/29/2019   Over weight 05/29/2019   Elevated blood pressure reading 05/29/2019   Immunization due 08/03/2017   Left leg swelling 05/04/2017   Allergic rhinitis 10/11/2015   Hypothyroidism 12/10/2013   Peripheral neuropathy 05/16/2013    REFERRING DIAG:  Diagnosis  R26.9 (ICD-10-CM) - Gait disturbance    THERAPY DIAG:  Difficulty in walking, not elsewhere classified  Muscle weakness (generalized)  Rationale for Evaluation and Treatment  Rehabilitation  PERTINENT HISTORY: see above   PRECAUTIONS: falls , balance  SUBJECTIVE:                                                                                                                                                                                      SUBJECTIVE STATEMENT:  Pt reports she needed to stand for approx 30 mins and experienced a burning sensation of her L leg, but without an extended issue. Pt says she was pleased with she stood for so long.  PAIN:  Are you having pain? Yes: NPRS scale: none /10 Pain  location: hips when she does her standing LE exercises  Pain description: sore, burning  Aggravating factors: exercises  Relieving factors: rest   OBJECTIVE: (objective measures completed at initial evaluation unless otherwise dated)  DIAGNOSTIC FINDINGS:  DG hip 06/08/22 IMPRESSION: Post ORIF of proximal RIGHT femur.   DG Knee 06/07/22 IMPRESSION: No acute fracture or dislocation.   PATIENT SURVEYS:  FOTO: Perceived function   48%, predicted   54%    COGNITION: Overall cognitive status: Within functional limits for tasks assessed                         SENSATION: Light touch: Impaired L ant thigh   EDEMA:  NT   MUSCLE LENGTH: Hamstrings: Right NT deg; Left NT deg Maisie Fus test: Right NT deg; Left NT deg   POSTURE: rounded shoulders, forward head, decreased lumbar lordosis, increased thoracic kyphosis, and flexed trunk    LOWER EXTREMITY ROM: Grossly WNLs Active ROM Right eval Left eval  Hip flexion      Hip extension      Hip abduction      Hip adduction      Hip internal rotation      Hip external rotation      Knee flexion      Knee extension      Ankle dorsiflexion      Ankle plantarflexion      Ankle inversion      Ankle eversion       (Blank rows = not tested)   LOWER EXTREMITY MMT:   MMT Right eval Left eval  Hip flexion 4+ 4  Hip extension 3- 3-  Hip abduction 4 4-  Hip adduction 4 4+  Hip internal rotation 4+  4+  Hip external rotation 4 4-  Knee flexion 5 5  Knee extension 5 5  Ankle dorsiflexion 5 5  Ankle plantarflexion 4+ 4+  Ankle inversion      Ankle eversion       (Blank rows = not tested)   LOWER EXTREMITY SPECIAL TESTS:  NT   FUNCTIONAL TESTS:  5 times sit to stand: 11.8 WNLs s use of hands 2 minute walk test: 336ft Standing narrow: 10 sec declined Standing EC, normal base: 10 sec WNLs Single leg stance: 2" each   GAIT: Distance walked: 340' Assistive device utilized: None Level of assistance: Complete [place] Comments: Somewhat short of breath     TODAY'S TREATMENT:    [facility] Adult PT Treatment:                                                DATE: 04/05/23 Therapeutic Exercise: Nustep L5 LE  6 min  SLR c quad sets x15 each Bridging x15 Supine clams x15 GTB Sit to stand x 10 no UEs ; x10 holding a ball with upward lifts  Standing shoulder rows 2x12 GTB Standing hip abd x 15 each side  Standing hip ext x 15 each side  Reviewed and updated HEP        [facility] Adult PT Treatment:                                                DATE:  04/03/23 Therapeutic Exercise: Nustep L6 UE and LE  7 min  Sit to stand x 10 no UEs  Standing hip abd x 10 each side x 3 sets with cues and explanation   BERG BALANCE  Sitting to Standing: Numbers; 0-4: 4 2. Standing unsupported: Numbers; 0-4: 4 3. Sitting unsupported: Numbers; 0-4: 4 4. Standing to Sitting: Numbers; 0-4: 4              5. Transfers: Numbers; 0-4: 4 6. Standing with eyes closed: Numbers; 0-4: 4 7. Standing with feet together: Numbers; 0-4: 3 8.Reaching forward with outstretched arm: Numbers; 0-4: 3 9. Retrieving object from the floor: Numbers; 0-4: 3 10 Turning to look behind: Numbers; 0-4: 2 11. Turning 360 degrees: Numbers; 0-4: 2 12. Place alternate foot on stool: Numbers; 0-4: 2 13.Standing with one foot in front: Numbers; 0-4: 3 14. Standing on one foot: Numbers; 0-4: 3  Total Score: 46/56  #10 due to spine  stiffness with trunk rotation  Self Care: Need for cane, implications of falling, environmental challenges for balance and fall risk                                                                                                                       [facility] Adult PT Treatment:                                                DATE: 03/28/23 Therapeutic Exercise: Developed, instructed in, and pt completed therex as noted in HEP    PATIENT EDUCATION:  Education details: Eval findings, POC, HEP, self care  Person educated: Patient Education method: Explanation, Demonstration, Tactile cues, Verbal cues, and Handouts Education comprehension: verbalized understanding, returned demonstration, verbal cues required, and tactile cues required   HOME EXERCISE PROGRAM: Access Code: [facility] URL: [email or web address] Date: 04/05/2023 Prepared by: Joellyn Rued  Exercises - Standing Hip Abduction with Counter Support  - 1 x daily - 7 x weekly - 2 sets - 10 reps - 2 hold - Standing Single Leg Stance with Counter Support  - 1 x daily - 7 x weekly - 1 sets - 3 reps - 20 hold - Sit to Stand Without Arm Support  - 1 x daily - 7 x weekly - 2 sets - 10 reps - 2 hold - Hooklying Clamshell with Resistance  - 1 x daily - 7 x weekly - 2 sets - 10 reps - 2 hold - Supine Bridge  - 1 x daily - 7 x weekly - 2 sets - 10 reps - 2 hold  ASSESSMENT:   CLINICAL IMPRESSION: PT was completed for LE/lumbopelvic/postural strengthening and balance. Pt's HEP was updated for hip strengthening therex. Pt stated she felt reasonably tired after the session. Pt tolerated PT today without adverse effects. Pt will continue to benefit from skilled PT to address impairments for improved  function.  OBJECTIVE IMPAIRMENTS: decreased activity tolerance, decreased balance, difficulty walking, decreased strength, postural dysfunction, obesity, and pain.    ACTIVITY LIMITATIONS: carrying, lifting, bending, standing, squatting, and  locomotion level   PARTICIPATION LIMITATIONS: meal prep, cleaning, laundry, shopping, community activity, and yard work   PERSONAL FACTORS: Age, Fitness, Past/current experiences, Time since onset of injury/illness/exacerbation, and 1-2 comorbidities: High BMI, R hip fx- IM nail repair 06/08/22  are also affecting patient's functional outcome.    REHAB POTENTIAL: Good   CLINICAL DECISION MAKING: Evolving/moderate complexity   EVALUATION COMPLEXITY: Moderate     GOALS:   SHORT TERM GOALS: Target date: 04/27/23 Pt will be Ind in an initial HEP  Baseline: started Goal status: ongoing    LONG TERM GOALS: Target date: 06/01/23   Pt will be Ind in a final HEP to maintain achieved LOF  Baseline: started Goal status: ongoing   2.  Improve by MCID of 37ft as indication of improved functional mobility  Baseline: 343ft Goal status: ongoing    3.  Increased bilat hip ext strength to 3+ and abd, ER to 4+ for improved balance and functional mobility Baseline: see flow sheets Goal status: ongoing    4.  Pt's Berg balance test will increase by 5 points as indication of improved balance Baseline: 46/56 Goal status: ongoing    5.  Pt's FOTO score will improved to the predicted value of 54% as indication of improved function  Baseline: 48% Goal status: INITIAL PLAN:   PT FREQUENCY: 2x/week   PT DURATION: 8 weeks   PLANNED INTERVENTIONS: Therapeutic exercises, Therapeutic activity, Neuromuscular re-education, Balance training, Gait training, Patient/Family education, Self Care, Joint mobilization, Aquatic Therapy, Dry Needling, Cognitive remediation, Cryotherapy, Moist heat, Taping, Ultrasound, Ionotophoresis 4mg /ml Dexamethasone, Manual therapy, and Re-evaluation   PLAN FOR NEXT SESSION: balance.  Include supine trunk rotation and head turns.  Airex/ more dynamic balance .  assess response to HEP; progress therex as indicated; use of modalities, manual therapy; and TPDN as indicated.         Hadi Dubin MS, PT 04/05/23 5:55 PM

## 2023-04-05 ENCOUNTER — Ambulatory Visit: Payer: Medicare HMO

## 2023-04-05 ENCOUNTER — Ambulatory Visit: Payer: Medicare HMO | Attending: Family Medicine

## 2023-04-05 DIAGNOSIS — M6281 Muscle weakness (generalized): Secondary | ICD-10-CM | POA: Diagnosis not present

## 2023-04-05 DIAGNOSIS — R269 Unspecified abnormalities of gait and mobility: Secondary | ICD-10-CM | POA: Diagnosis not present

## 2023-04-05 DIAGNOSIS — R262 Difficulty in walking, not elsewhere classified: Secondary | ICD-10-CM | POA: Diagnosis not present

## 2023-04-17 ENCOUNTER — Ambulatory Visit: Payer: Medicare HMO | Admitting: Physical Therapy

## 2023-04-17 ENCOUNTER — Encounter: Payer: Self-pay | Admitting: Physical Therapy

## 2023-04-17 DIAGNOSIS — R269 Unspecified abnormalities of gait and mobility: Secondary | ICD-10-CM | POA: Diagnosis not present

## 2023-04-17 DIAGNOSIS — R262 Difficulty in walking, not elsewhere classified: Secondary | ICD-10-CM

## 2023-04-17 DIAGNOSIS — M6281 Muscle weakness (generalized): Secondary | ICD-10-CM

## 2023-04-17 NOTE — Therapy (Signed)
OUTPATIENT PHYSICAL THERAPY TREATMENT NOTE   Patient Name: Krystal Moore MRN: [id number] DOB:1930/04/03, 89 y.o., female Today's Date: 04/17/2023  PCP: Marcine Matar, MD REFERRING PROVIDER: Marcine Matar, MD  END OF SESSION:   PT End of Session - 04/17/23 1304     Visit Number 4    Number of Visits 17    Date for PT Re-Evaluation 06/01/23    PT Start Time 1300    PT Stop Time 1343    PT Time Calculation (min) 43 min             Past Medical History:  Diagnosis Date   Anemia    Narcolepsy    Neuropathy    Obesity    Pneumonia    Polyneuropathy in other diseases classified elsewhere [facility])    Shoulder fracture, left    Past Surgical History:  Procedure Laterality Date   CATARACT EXTRACTION Left 06/2019   INTRAMEDULLARY (IM) NAIL INTERTROCHANTERIC Right 06/08/2022   Procedure: INTRAMEDULLARY (IM) NAIL INTERTROCHANTERIC;  Surgeon: Samson Frederic, MD;  Location: [facility];  Service: Orthopedics;  Laterality: Right;   MOUTH SURGERY     NASAL SINUS SURGERY     Patient Active Problem List   Diagnosis Date Noted   CTS (carpal tunnel syndrome) 08/16/2022   Closed right hip fracture (HCC) 06/07/2022   Fall at home, initial encounter 06/07/2022   Numbness 07/30/2019   Spinal stenosis of lumbar region 07/30/2019   Osteopenia after menopause 05/29/2019   Over weight 05/29/2019   Elevated blood pressure reading 05/29/2019   Immunization due 08/03/2017   Left leg swelling 05/04/2017   Allergic rhinitis 10/11/2015   Hypothyroidism 12/10/2013   Peripheral neuropathy 05/16/2013    REFERRING DIAG:  Diagnosis  R26.9 (ICD-10-CM) - Gait disturbance    THERAPY DIAG:  Difficulty in walking, not elsewhere classified  Muscle weakness (generalized)  Rationale for Evaluation and Treatment Rehabilitation  PERTINENT HISTORY: see above   PRECAUTIONS: falls , balance  SUBJECTIVE:                                                                                                                                                                                       SUBJECTIVE STATEMENT:  I don't have pain, the right hip is sore and the left leg feels heavy   PAIN:  Are you having pain? Yes: NPRS scale: none /10 Pain location: hips when she does her standing LE exercises  Pain description: sore, burning  Aggravating factors: exercises  Relieving factors: rest   OBJECTIVE: (objective measures completed at initial evaluation unless otherwise dated)  DIAGNOSTIC FINDINGS:  DG hip 06/08/22 IMPRESSION: Post ORIF of proximal  RIGHT femur.   DG Knee 06/07/22 IMPRESSION: No acute fracture or dislocation.   PATIENT SURVEYS:  FOTO: Perceived function   48%, predicted   54%    COGNITION: Overall cognitive status: Within functional limits for tasks assessed                         SENSATION: Light touch: Impaired L ant thigh   EDEMA:  NT   MUSCLE LENGTH: Hamstrings: Right NT deg; Left NT deg Maisie Fus test: Right NT deg; Left NT deg   POSTURE: rounded shoulders, forward head, decreased lumbar lordosis, increased thoracic kyphosis, and flexed trunk    LOWER EXTREMITY ROM: Grossly WNLs Active ROM Right eval Left eval  Hip flexion      Hip extension      Hip abduction      Hip adduction      Hip internal rotation      Hip external rotation      Knee flexion      Knee extension      Ankle dorsiflexion      Ankle plantarflexion      Ankle inversion      Ankle eversion       (Blank rows = not tested)   LOWER EXTREMITY MMT:   MMT Right eval Left eval  Hip flexion 4+ 4  Hip extension 3- 3-  Hip abduction 4 4-  Hip adduction 4 4+  Hip internal rotation 4+ 4+  Hip external rotation 4 4-  Knee flexion 5 5  Knee extension 5 5  Ankle dorsiflexion 5 5  Ankle plantarflexion 4+ 4+  Ankle inversion      Ankle eversion       (Blank rows = not tested)   LOWER EXTREMITY SPECIAL TESTS:  NT   FUNCTIONAL TESTS:  5 times sit to stand:  11.8 WNLs s use of hands 2 minute walk test: 357ft Standing narrow: 10 sec declined Standing EC, normal base: 10 sec WNLs Single leg stance: 2" each   GAIT: Distance walked: 340' Assistive device utilized: None Level of assistance: Complete [place] Comments: Somewhat short of breath     TODAY'S TREATMENT:   [facility] Adult PT Treatment:                                                DATE: 04/17/23 Therapeutic Exercise: Nustep  Standing heel raises x 15  Standing march  x 15 each  Standing hip abdct x 15 each Standing h/s curls x 10 each  Standning hip ext x 15 each Mini sink squat LAQ x 15 each  Supine march Bridge Ball squeeze  L quad set Green band clam Hip flexor stretch left , EOM     [facility] Adult PT Treatment:                                                DATE: 04/05/23 Therapeutic Exercise: Nustep L5 LE  6 min  SLR c quad sets x15 each Bridging x15 Supine clams x15 GTB Sit to stand x 10 no UEs ; x10 holding a ball with upward lifts  Standing shoulder rows 2x12 GTB Standing hip abd x 15 each side  Standing hip ext x 15 each side  Reviewed and updated HEP        [facility] Adult PT Treatment:                                                DATE: 04/03/23 Therapeutic Exercise: Nustep L6 UE and LE  7 min  Sit to stand x 10 no UEs  Standing hip abd x 10 each side x 3 sets with cues and explanation   BERG BALANCE  Sitting to Standing: Numbers; 0-4: 4 2. Standing unsupported: Numbers; 0-4: 4 3. Sitting unsupported: Numbers; 0-4: 4 4. Standing to Sitting: Numbers; 0-4: 4              5. Transfers: Numbers; 0-4: 4 6. Standing with eyes closed: Numbers; 0-4: 4 7. Standing with feet together: Numbers; 0-4: 3 8.Reaching forward with outstretched arm: Numbers; 0-4: 3 9. Retrieving object from the floor: Numbers; 0-4: 3 10 Turning to look behind: Numbers; 0-4: 2 11. Turning 360 degrees: Numbers; 0-4: 2 12. Place alternate foot on stool: Numbers; 0-4: 2 13.Standing with  one foot in front: Numbers; 0-4: 3 14. Standing on one foot: Numbers; 0-4: 3  Total Score: 46/56  #10 due to spine stiffness with trunk rotation  Self Care: Need for cane, implications of falling, environmental challenges for balance and fall risk                                                                                                                       [facility] Adult PT Treatment:                                                DATE: 03/28/23 Therapeutic Exercise: Developed, instructed in, and pt completed therex as noted in HEP    PATIENT EDUCATION:  Education details: Eval findings, POC, HEP, self care  Person educated: Patient Education method: Explanation, Demonstration, Tactile cues, Verbal cues, and Handouts Education comprehension: verbalized understanding, returned demonstration, verbal cues required, and tactile cues required   HOME EXERCISE PROGRAM: Access Code: [facility] URL: [email or web address] Date: 04/05/2023 Prepared by: Joellyn Rued  Exercises - Standing Hip Abduction with Counter Support  - 1 x daily - 7 x weekly - 2 sets - 10 reps - 2 hold - Standing Single Leg Stance with Counter Support  - 1 x daily - 7 x weekly - 1 sets - 3 reps - 20 hold - Sit to Stand Without Arm Support  - 1 x daily - 7 x weekly - 2 sets - 10 reps - 2 hold - Hooklying Clamshell with Resistance  - 1 x daily - 7 x weekly - 2 sets - 10 reps - 2 hold -  Supine Bridge  - 1 x daily - 7 x weekly - 2 sets - 10 reps - 2 hold  ASSESSMENT:   CLINICAL IMPRESSION: PT was completed for LE/lumbopelvic strengthening. Pt reports no pain on arrival. She reports continued heaviness in Left thigh and hesitancy with thinking the leg might give out. Began hip flexor stretch with care to avoid lumbar strain. Pt reported the stretch felt good and she will  try it at home.   Pt tolerated PT today without adverse effects. Pt will continue to benefit from skilled PT to address impairments for improved  function.  OBJECTIVE IMPAIRMENTS: decreased activity tolerance, decreased balance, difficulty walking, decreased strength, postural dysfunction, obesity, and pain.    ACTIVITY LIMITATIONS: carrying, lifting, bending, standing, squatting, and locomotion level   PARTICIPATION LIMITATIONS: meal prep, cleaning, laundry, shopping, community activity, and yard work   PERSONAL FACTORS: Age, Fitness, Past/current experiences, Time since onset of injury/illness/exacerbation, and 1-2 comorbidities: High BMI, R hip fx- IM nail repair 06/08/22  are also affecting patient's functional outcome.    REHAB POTENTIAL: Good   CLINICAL DECISION MAKING: Evolving/moderate complexity   EVALUATION COMPLEXITY: Moderate     GOALS:   SHORT TERM GOALS: Target date: 04/27/23 Pt will be Ind in an initial HEP  Baseline: started Goal status: ongoing    LONG TERM GOALS: Target date: 06/01/23   Pt will be Ind in a final HEP to maintain achieved LOF  Baseline: started Goal status: ongoing   2.  Improve by MCID of 90ft as indication of improved functional mobility  Baseline: 339ft Goal status: ongoing    3.  Increased bilat hip ext strength to 3+ and abd, ER to 4+ for improved balance and functional mobility Baseline: see flow sheets Goal status: ongoing    4.  Pt's Berg balance test will increase by 5 points as indication of improved balance Baseline: 46/56 Goal status: ongoing    5.  Pt's FOTO score will improved to the predicted value of 54% as indication of improved function  Baseline: 48% Goal status: INITIAL PLAN:   PT FREQUENCY: 2x/week   PT DURATION: 8 weeks   PLANNED INTERVENTIONS: Therapeutic exercises, Therapeutic activity, Neuromuscular re-education, Balance training, Gait training, Patient/Family education, Self Care, Joint mobilization, Aquatic Therapy, Dry Needling, Cognitive remediation, Cryotherapy, Moist heat, Taping, Ultrasound, Ionotophoresis 4mg /ml Dexamethasone, Manual therapy,  and Re-evaluation   PLAN FOR NEXT SESSION: balance.  Include supine trunk rotation and head turns.  Airex/ more dynamic balance .  assess response to HEP; progress therex as indicated; use of modalities, manual therapy; and TPDN as indicated.        Jannette Spanner, PTA 04/17/23 1:46 PM Phone: [phone] Fax: [phone]

## 2023-04-18 ENCOUNTER — Other Ambulatory Visit: Payer: Self-pay | Admitting: Internal Medicine

## 2023-04-18 DIAGNOSIS — M8580 Other specified disorders of bone density and structure, unspecified site: Secondary | ICD-10-CM

## 2023-04-19 ENCOUNTER — Encounter: Payer: Self-pay | Admitting: Physical Therapy

## 2023-04-19 ENCOUNTER — Ambulatory Visit: Payer: Medicare HMO | Admitting: Physical Therapy

## 2023-04-19 DIAGNOSIS — M6281 Muscle weakness (generalized): Secondary | ICD-10-CM

## 2023-04-19 DIAGNOSIS — R269 Unspecified abnormalities of gait and mobility: Secondary | ICD-10-CM | POA: Diagnosis not present

## 2023-04-19 DIAGNOSIS — R262 Difficulty in walking, not elsewhere classified: Secondary | ICD-10-CM | POA: Diagnosis not present

## 2023-04-19 NOTE — Therapy (Signed)
OUTPATIENT PHYSICAL THERAPY TREATMENT NOTE   Patient Name: Krystal Moore MRN: [id number] DOB:July 26, 1930, 89 y.o., female Today's Date: 04/19/2023  PCP: Marcine Matar, MD REFERRING PROVIDER: Marcine Matar, MD  END OF SESSION:   PT End of Session - 04/19/23 1402     Visit Number 5    Number of Visits 17    Date for PT Re-Evaluation 06/01/23    Authorization Type [employer] MEDICARE HMO/PPO    PT Start Time 1400    PT Stop Time 1445    PT Time Calculation (min) 45 min             Past Medical History:  Diagnosis Date   Anemia    Narcolepsy    Neuropathy    Obesity    Pneumonia    Polyneuropathy in other diseases classified elsewhere [facility])    Shoulder fracture, left    Past Surgical History:  Procedure Laterality Date   CATARACT EXTRACTION Left 06/2019   INTRAMEDULLARY (IM) NAIL INTERTROCHANTERIC Right 06/08/2022   Procedure: INTRAMEDULLARY (IM) NAIL INTERTROCHANTERIC;  Surgeon: Samson Frederic, MD;  Location: [facility];  Service: Orthopedics;  Laterality: Right;   MOUTH SURGERY     NASAL SINUS SURGERY     Patient Active Problem List   Diagnosis Date Noted   CTS (carpal tunnel syndrome) 08/16/2022   Closed right hip fracture (HCC) 06/07/2022   Fall at home, initial encounter 06/07/2022   Numbness 07/30/2019   Spinal stenosis of lumbar region 07/30/2019   Osteopenia after menopause 05/29/2019   Over weight 05/29/2019   Elevated blood pressure reading 05/29/2019   Immunization due 08/03/2017   Left leg swelling 05/04/2017   Allergic rhinitis 10/11/2015   Hypothyroidism 12/10/2013   Peripheral neuropathy 05/16/2013    REFERRING DIAG:  Diagnosis  R26.9 (ICD-10-CM) - Gait disturbance    THERAPY DIAG:  Difficulty in walking, not elsewhere classified  Muscle weakness (generalized)  Rationale for Evaluation and Treatment Rehabilitation  PERTINENT HISTORY: see above   PRECAUTIONS: falls , balance  SUBJECTIVE:                                                                                                                                                                                       SUBJECTIVE STATEMENT:  I don't have pain, the right hip is sore and the left leg feels heavy   PAIN:  Are you having pain? Yes: NPRS scale: none /10 Pain location: hips when she does her standing LE exercises  Pain description: sore, burning  Aggravating factors: exercises  Relieving factors: rest   OBJECTIVE: (objective measures completed at initial evaluation unless otherwise dated)  DIAGNOSTIC FINDINGS:  DG hip 06/08/22 IMPRESSION: Post ORIF of proximal RIGHT femur.   DG Knee 06/07/22 IMPRESSION: No acute fracture or dislocation.   PATIENT SURVEYS:  FOTO: Perceived function   48%, predicted   54%    COGNITION: Overall cognitive status: Within functional limits for tasks assessed                         SENSATION: Light touch: Impaired L ant thigh   EDEMA:  NT   MUSCLE LENGTH: Hamstrings: Right NT deg; Left NT deg Maisie Fus test: Right NT deg; Left NT deg   POSTURE: rounded shoulders, forward head, decreased lumbar lordosis, increased thoracic kyphosis, and flexed trunk    LOWER EXTREMITY ROM: Grossly WNLs Active ROM Right eval Left eval  Hip flexion      Hip extension      Hip abduction      Hip adduction      Hip internal rotation      Hip external rotation      Knee flexion      Knee extension      Ankle dorsiflexion      Ankle plantarflexion      Ankle inversion      Ankle eversion       (Blank rows = not tested)   LOWER EXTREMITY MMT:   MMT Right eval Left eval  Hip flexion 4+ 4  Hip extension 3- 3-  Hip abduction 4 4-  Hip adduction 4 4+  Hip internal rotation 4+ 4+  Hip external rotation 4 4-  Knee flexion 5 5  Knee extension 5 5  Ankle dorsiflexion 5 5  Ankle plantarflexion 4+ 4+  Ankle inversion      Ankle eversion       (Blank rows = not tested)   LOWER EXTREMITY SPECIAL TESTS:  NT    FUNCTIONAL TESTS:  5 times sit to stand: 11.8 WNLs s use of hands 2 minute walk test: 348ft Standing narrow: 10 sec declined Standing EC, normal base: 10 sec WNLs Single leg stance: 2" each   GAIT: Distance walked: 340' Assistive device utilized: None Level of assistance: Complete Independence Comments: Somewhat short of breath     TODAY'S TREATMENT:   [facility] Adult PT Treatment:                                                DATE: 04/19/23 Therapeutic Exercise: Supine March 10 x 2  Supine clam blue band 10 x 2  Banded Bridge blue 10 x 2  LTR x 10 Ball Squeeze x 10 Seated lumbar flexion  Standing hip abdct  red band 10 x 2  Standing hip ext red band 10 x 2  Mini squats at sink  Hip flexion x 10 each Seated h/s stretch with strap  Supine thomas stretch each side  Seated hip hinge Seated pelvic rocking  Neuromuscular re-ed: Narrow stance on AIREX with head turns and nods  Self Care: Avoid sacral sitting: Seated posture towel roll at lumbar, support using chair back   [facility] Adult PT Treatment:  DATE: 04/17/23 Therapeutic Exercise: Nustep  Standing heel raises x 15  Standing march  x 15 each  Standing hip abdct x 15 each Standing h/s curls x 10 each  Standning hip ext x 15 each Mini sink squat LAQ x 15 each  Supine march Bridge Ball squeeze  L quad set Green band clam Hip flexor stretch left , EOM     [facility] Adult PT Treatment:                                                DATE: 04/05/23 Therapeutic Exercise: Nustep L5 LE  6 min  SLR c quad sets x15 each Bridging x15 Supine clams x15 GTB Sit to stand x 10 no UEs ; x10 holding a ball with upward lifts  Standing shoulder rows 2x12 GTB Standing hip abd x 15 each side  Standing hip ext x 15 each side  Reviewed and updated HEP        [facility] Adult PT Treatment:                                                DATE: 04/03/23 Therapeutic Exercise: Nustep L6 UE and LE  7 min   Sit to stand x 10 no UEs  Standing hip abd x 10 each side x 3 sets with cues and explanation   BERG BALANCE  Sitting to Standing: Numbers; 0-4: 4 2. Standing unsupported: Numbers; 0-4: 4 3. Sitting unsupported: Numbers; 0-4: 4 4. Standing to Sitting: Numbers; 0-4: 4              5. Transfers: Numbers; 0-4: 4 6. Standing with eyes closed: Numbers; 0-4: 4 7. Standing with feet together: Numbers; 0-4: 3 8.Reaching forward with outstretched arm: Numbers; 0-4: 3 9. Retrieving object from the floor: Numbers; 0-4: 3 10 Turning to look behind: Numbers; 0-4: 2 11. Turning 360 degrees: Numbers; 0-4: 2 12. Place alternate foot on stool: Numbers; 0-4: 2 13.Standing with one foot in front: Numbers; 0-4: 3 14. Standing on one foot: Numbers; 0-4: 3  Total Score: 46/56  #10 due to spine stiffness with trunk rotation  Self Care: Need for cane, implications of falling, environmental challenges for balance and fall risk                                                                                                                       [facility] Adult PT Treatment:                                                DATE: 03/28/23 Therapeutic  Exercise: Developed, instructed in, and pt completed therex as noted in HEP    PATIENT EDUCATION:  Education details: Eval findings, POC, HEP, self care  Person educated: Patient Education method: Explanation, Demonstration, Tactile cues, Verbal cues, and Handouts Education comprehension: verbalized understanding, returned demonstration, verbal cues required, and tactile cues required   HOME EXERCISE PROGRAM: Access Code: [facility] URL: [email or web address] Date: 04/05/2023 Prepared by: Joellyn Rued  Exercises - Standing Hip Abduction with Counter Support  - 1 x daily - 7 x weekly - 2 sets - 10 reps - 2 hold - Standing Single Leg Stance with Counter Support  - 1 x daily - 7 x weekly - 1 sets - 3 reps - 20 hold - Sit to Stand Without Arm Support  -  1 x daily - 7 x weekly - 2 sets - 10 reps - 2 hold - Hooklying Clamshell with Resistance  - 1 x daily - 7 x weekly - 2 sets - 10 reps - 2 hold - Supine Bridge  - 1 x daily - 7 x weekly - 2 sets - 10 reps - 2 hold  - Modified Thomas Stretch  - 1 x daily - 7 x weekly - 1 sets - 5 reps - 10 hold - Long Sitting Calf Stretch with Strap  - 1 x daily - 7 x weekly - 1 sets - 5-10 reps - 5-10 hold  ASSESSMENT:   CLINICAL IMPRESSION: PT was completed for LE/lumbopelvic strengthening. Pt reports no pain on arrival. She reports improvement in left thigh pain. Education provided on seated posture. She tends to sacral sit. Used supportive chair and lumbar support to educate on correct seated posture. Continued with hip strength and trunk/ hip mobility. Updated HEP with hip flexor and hamstring stretches. Began dynamic balance.  Pt tolerated PT today without adverse effects. Pt will continue to benefit from skilled PT to address impairments for improved function.  OBJECTIVE IMPAIRMENTS: decreased activity tolerance, decreased balance, difficulty walking, decreased strength, postural dysfunction, obesity, and pain.    ACTIVITY LIMITATIONS: carrying, lifting, bending, standing, squatting, and locomotion level   PARTICIPATION LIMITATIONS: meal prep, cleaning, laundry, shopping, community activity, and yard work   PERSONAL FACTORS: Age, Fitness, Past/current experiences, Time since onset of injury/illness/exacerbation, and 1-2 comorbidities: High BMI, R hip fx- IM nail repair 06/08/22  are also affecting patient's functional outcome.    REHAB POTENTIAL: Good   CLINICAL DECISION MAKING: Evolving/moderate complexity   EVALUATION COMPLEXITY: Moderate     GOALS:   SHORT TERM GOALS: Target date: 04/27/23 Pt will be Ind in an initial HEP  Baseline: started Goal status: ongoing    LONG TERM GOALS: Target date: 06/01/23   Pt will be Ind in a final HEP to maintain achieved LOF  Baseline: started Goal status:  ongoing   2.  Improve by MCID of 12ft as indication of improved functional mobility  Baseline: 359ft Goal status: ongoing    3.  Increased bilat hip ext strength to 3+ and abd, ER to 4+ for improved balance and functional mobility Baseline: see flow sheets Goal status: ongoing    4.  Pt's Berg balance test will increase by 5 points as indication of improved balance Baseline: 46/56 Goal status: ongoing    5.  Pt's FOTO score will improved to the predicted value of 54% as indication of improved function  Baseline: 48% Goal status: INITIAL PLAN:   PT FREQUENCY: 2x/week   PT DURATION: 8 weeks   PLANNED INTERVENTIONS: Therapeutic exercises,  Therapeutic activity, Neuromuscular re-education, Balance training, Gait training, Patient/Family education, Self Care, Joint mobilization, Aquatic Therapy, Dry Needling, Cognitive remediation, Cryotherapy, Moist heat, Taping, Ultrasound, Ionotophoresis 4mg /ml Dexamethasone, Manual therapy, and Re-evaluation   PLAN FOR NEXT SESSION: balance.  Include supine trunk rotation and head turns.  Airex/ more dynamic balance .  assess response to HEP; progress therex as indicated; use of modalities, manual therapy; and TPDN as indicated.        Jannette Spanner, PTA 04/19/23 3:08 PM Phone: [phone] Fax: [phone]

## 2023-04-23 NOTE — Therapy (Unsigned)
OUTPATIENT PHYSICAL THERAPY TREATMENT NOTE   Patient Name: Krystal Moore MRN: [id number] DOB:May 28, 1930, 89 y.o., female Today's Date: 04/24/2023  PCP: Marcine Matar, MD REFERRING PROVIDER: Marcine Matar, MD  END OF SESSION:   PT End of Session - 04/24/23 1110     Visit Number 6    Number of Visits 17    Date for PT Re-Evaluation 06/01/23    Authorization Type [employer] MEDICARE HMO/PPO    PT Start Time 1106    PT Stop Time 1145    PT Time Calculation (min) 39 min    Activity Tolerance Patient tolerated treatment well    Behavior During Therapy WFL for tasks assessed/performed              Past Medical History:  Diagnosis Date   Anemia    Narcolepsy    Neuropathy    Obesity    Pneumonia    Polyneuropathy in other diseases classified elsewhere [facility])    Shoulder fracture, left    Past Surgical History:  Procedure Laterality Date   CATARACT EXTRACTION Left 06/2019   INTRAMEDULLARY (IM) NAIL INTERTROCHANTERIC Right 06/08/2022   Procedure: INTRAMEDULLARY (IM) NAIL INTERTROCHANTERIC;  Surgeon: Samson Frederic, MD;  Location: [facility];  Service: Orthopedics;  Laterality: Right;   MOUTH SURGERY     NASAL SINUS SURGERY     Patient Active Problem List   Diagnosis Date Noted   CTS (carpal tunnel syndrome) 08/16/2022   Closed right hip fracture (HCC) 06/07/2022   Fall at home, initial encounter 06/07/2022   Numbness 07/30/2019   Spinal stenosis of lumbar region 07/30/2019   Osteopenia after menopause 05/29/2019   Over weight 05/29/2019   Elevated blood pressure reading 05/29/2019   Immunization due 08/03/2017   Left leg swelling 05/04/2017   Allergic rhinitis 10/11/2015   Hypothyroidism 12/10/2013   Peripheral neuropathy 05/16/2013    REFERRING DIAG:  Diagnosis  R26.9 (ICD-10-CM) - Gait disturbance    THERAPY DIAG:  Difficulty in walking, not elsewhere classified  Muscle weakness (generalized)  Rationale for Evaluation and Treatment  Rehabilitation  PERTINENT HISTORY: see above   PRECAUTIONS: falls , balance  SUBJECTIVE:                                                                                                                                                                                      SUBJECTIVE STATEMENT:  The left thigh feels better than it did last time. The green band really helped. She wants to work on her posture.  No pain . Trying to sit more upright   PAIN:  Are you having pain? Yes: NPRS scale: none /10 Pain location: hips  when she does her standing LE exercises  Pain description: sore, burning  Aggravating factors: exercises  Relieving factors: rest   OBJECTIVE: (objective measures completed at initial evaluation unless otherwise dated)  DIAGNOSTIC FINDINGS:  DG hip 06/08/22 IMPRESSION: Post ORIF of proximal RIGHT femur.   DG Knee 06/07/22 IMPRESSION: No acute fracture or dislocation.   PATIENT SURVEYS:  FOTO: Perceived function   48%, predicted   54%    COGNITION: Overall cognitive status: Within functional limits for tasks assessed                         SENSATION: Light touch: Impaired L ant thigh   EDEMA:  NT   MUSCLE LENGTH: Hamstrings: Right NT deg; Left NT deg Maisie Fus test: Right NT deg; Left NT deg   POSTURE: rounded shoulders, forward head, decreased lumbar lordosis, increased thoracic kyphosis, and flexed trunk    LOWER EXTREMITY ROM: Grossly WNLs Active ROM Right eval Left eval  Hip flexion      Hip extension      Hip abduction      Hip adduction      Hip internal rotation      Hip external rotation      Knee flexion      Knee extension      Ankle dorsiflexion      Ankle plantarflexion      Ankle inversion      Ankle eversion       (Blank rows = not tested)   LOWER EXTREMITY MMT:   MMT Right eval Left eval  Hip flexion 4+ 4  Hip extension 3- 3-  Hip abduction 4 4-  Hip adduction 4 4+  Hip internal rotation 4+ 4+  Hip external rotation 4  4-  Knee flexion 5 5  Knee extension 5 5  Ankle dorsiflexion 5 5  Ankle plantarflexion 4+ 4+  Ankle inversion      Ankle eversion       (Blank rows = not tested)   LOWER EXTREMITY SPECIAL TESTS:  NT   FUNCTIONAL TESTS:  5 times sit to stand: 11.8 WNLs s use of hands 2 minute walk test: 355ft Standing narrow: 10 sec declined Standing EC, normal base: 10 sec WNLs Single leg stance: 2" each   GAIT: Distance walked: 340' Assistive device utilized: None Level of assistance: Complete Independence Comments: Somewhat short of breath     TODAY'S TREATMENT:    [facility] Adult PT Treatment:                                                DATE: 04/24/23 Therapeutic Exercise: NuStep L4 UE and LE for 7 min  Seated T-spine extension into foam roller  Shoulder flexion (double arm) with red looped band  x 8  Shoulder chest press with red loop x 8  Seated row 2 x 15 blue band with thoracic ext.  Standing row hold with high march  Shoulder ext. Green band x 10  Horizontal pull yellow band x 10  Supine LTR arms out x 10  Supine red band march  Supine red band clam   [facility] Adult PT Treatment:  DATE: 04/19/23 Therapeutic Exercise: Supine March 10 x 2  Supine clam blue band 10 x 2  Banded Bridge blue 10 x 2  LTR x 10 Ball Squeeze x 10 Seated lumbar flexion  Standing hip abdct  red band 10 x 2  Standing hip ext red band 10 x 2  Mini squats at sink  Hip flexion x 10 each Seated h/s stretch with strap  Supine thomas stretch each side  Seated hip hinge Seated pelvic rocking  Neuromuscular re-ed: Narrow stance on AIREX with head turns and nods  Self Care: Avoid sacral sitting: Seated posture towel roll at lumbar, support using chair back   [facility] Adult PT Treatment:                                                DATE: 04/17/23 Therapeutic Exercise: Nustep  Standing heel raises x 15  Standing march  x 15 each  Standing hip abdct x 15  each Standing h/s curls x 10 each  Standning hip ext x 15 each Mini sink squat LAQ x 15 each  Supine march Bridge Ball squeeze  L quad set Green band clam Hip flexor stretch left , EOM     [facility] Adult PT Treatment:                                                DATE: 04/05/23 Therapeutic Exercise: Nustep L5 LE  6 min  SLR c quad sets x15 each Bridging x15 Supine clams x15 GTB Sit to stand x 10 no UEs ; x10 holding a ball with upward lifts  Standing shoulder rows 2x12 GTB Standing hip abd x 15 each side  Standing hip ext x 15 each side  Reviewed and updated HEP        [facility] Adult PT Treatment:                                                DATE: 04/03/23 Therapeutic Exercise: Nustep L6 UE and LE  7 min  Sit to stand x 10 no UEs  Standing hip abd x 10 each side x 3 sets with cues and explanation   BERG BALANCE  Sitting to Standing: Numbers; 0-4: 4 2. Standing unsupported: Numbers; 0-4: 4 3. Sitting unsupported: Numbers; 0-4: 4 4. Standing to Sitting: Numbers; 0-4: 4              5. Transfers: Numbers; 0-4: 4 6. Standing with eyes closed: Numbers; 0-4: 4 7. Standing with feet together: Numbers; 0-4: 3 8.Reaching forward with outstretched arm: Numbers; 0-4: 3 9. Retrieving object from the floor: Numbers; 0-4: 3 10 Turning to look behind: Numbers; 0-4: 2 11. Turning 360 degrees: Numbers; 0-4: 2 12. Place alternate foot on stool: Numbers; 0-4: 2 13.Standing with one foot in front: Numbers; 0-4: 3 14. Standing on one foot: Numbers; 0-4: 3  Total Score: 46/56  #10 due to spine stiffness with trunk rotation  Self Care: Need for cane, implications of falling, environmental challenges for balance and fall risk  [facility] Adult PT Treatment:                                                DATE: 03/28/23 Therapeutic Exercise: Developed, instructed in, and pt completed  therex as noted in HEP    PATIENT EDUCATION:  Education details: Eval findings, POC, HEP, self care  Person educated: Patient Education method: Explanation, Demonstration, Tactile cues, Verbal cues, and Handouts Education comprehension: verbalized understanding, returned demonstration, verbal cues required, and tactile cues required   HOME EXERCISE PROGRAM:  Access Code: [facility] URL: [email or web address] Date: 04/24/2023 Prepared by: Karie Mainland  Exercises - Standing Hip Abduction with Counter Support  - 1 x daily - 7 x weekly - 2 sets - 10 reps - 2 hold - Standing Single Leg Stance with Counter Support  - 1 x daily - 7 x weekly - 1 sets - 3 reps - 20 hold - Sit to Stand Without Arm Support  - 1 x daily - 7 x weekly - 2 sets - 10 reps - 2 hold - Hooklying Clamshell with Resistance  - 1 x daily - 7 x weekly - 2 sets - 10 reps - 2 hold - Supine Bridge  - 1 x daily - 7 x weekly - 2 sets - 10 reps - 2 hold - Modified Thomas Stretch  - 1 x daily - 7 x weekly - 1 sets - 5 reps - 10 hold - Long Sitting Calf Stretch with Strap  - 1 x daily - 7 x weekly - 1 sets - 5-10 reps - 5-10 hold - Standing Row with Anchored Resistance  - 1 x daily - 7 x weekly - 2 sets - 10 reps - 5 hold - Shoulder extension with resistance - Neutral  - 1 x daily - 7 x weekly - 2 sets - 10 reps - 5 hold  ASSESSMENT:   CLINICAL IMPRESSION:  Pt worked on posture throughout session in sitting and in standing. She needs cues to lift head and maintain trunk extension.  Her posture does affect her balance as well.  Added upper body exercises in standing to integrate balance into her HEP as well.   OBJECTIVE IMPAIRMENTS: decreased activity tolerance, decreased balance, difficulty walking, decreased strength, postural dysfunction, obesity, and pain.    ACTIVITY LIMITATIONS: carrying, lifting, bending, standing, squatting, and locomotion level   PARTICIPATION LIMITATIONS: meal prep, cleaning, laundry,  shopping, community activity, and yard work   PERSONAL FACTORS: Age, Fitness, Past/current experiences, Time since onset of injury/illness/exacerbation, and 1-2 comorbidities: High BMI, R hip fx- IM nail repair 06/08/22  are also affecting patient's functional outcome.    REHAB POTENTIAL: Good   CLINICAL DECISION MAKING: Evolving/moderate complexity   EVALUATION COMPLEXITY: Moderate     GOALS:   SHORT TERM GOALS: Target date: 04/27/23 Pt will be Ind in an initial HEP  Baseline: started Goal status: ongoing    LONG TERM GOALS: Target date: 06/01/23   Pt will be Ind in a final HEP to maintain achieved LOF  Baseline: started Goal status: ongoing   2.  Improve by MCID of 64ft as indication of improved functional mobility  Baseline: 393ft Goal status: ongoing    3.  Increased bilat hip ext strength to 3+ and abd, ER to 4+ for improved balance and functional mobility Baseline: see flow sheets Goal status: ongoing  4.  Pt's Berg balance test will increase by 5 points as indication of improved balance Baseline: 46/56 Goal status: ongoing    5.  Pt's FOTO score will improved to the predicted value of 54% as indication of improved function  Baseline: 48% Goal status: INITIAL PLAN:   PT FREQUENCY: 2x/week   PT DURATION: 8 weeks   PLANNED INTERVENTIONS: Therapeutic exercises, Therapeutic activity, Neuromuscular re-education, Balance training, Gait training, Patient/Family education, Self Care, Joint mobilization, Aquatic Therapy, Dry Needling, Cognitive remediation, Cryotherapy, Moist heat, Taping, Ultrasound, Ionotophoresis 4mg /ml Dexamethasone, Manual therapy, and Re-evaluation   PLAN FOR NEXT SESSION: balance.  Include supine trunk rotation and head turns.  Airex/ more dynamic balance .  assess response to HEP; progress therex as indicated; use of modalities, manual therapy; and TPDN as indicated.            Karie Mainland, PT 04/24/23 11:53 AM Phone: [phone] Fax:  [phone]

## 2023-04-24 ENCOUNTER — Ambulatory Visit: Payer: Medicare HMO | Attending: Internal Medicine | Admitting: Physical Therapy

## 2023-04-24 ENCOUNTER — Encounter: Payer: Self-pay | Admitting: Physical Therapy

## 2023-04-24 DIAGNOSIS — M25641 Stiffness of right hand, not elsewhere classified: Secondary | ICD-10-CM | POA: Insufficient documentation

## 2023-04-24 DIAGNOSIS — R262 Difficulty in walking, not elsewhere classified: Secondary | ICD-10-CM | POA: Insufficient documentation

## 2023-04-24 DIAGNOSIS — M6281 Muscle weakness (generalized): Secondary | ICD-10-CM | POA: Insufficient documentation

## 2023-04-24 DIAGNOSIS — M25631 Stiffness of right wrist, not elsewhere classified: Secondary | ICD-10-CM | POA: Insufficient documentation

## 2023-04-24 DIAGNOSIS — R208 Other disturbances of skin sensation: Secondary | ICD-10-CM | POA: Diagnosis not present

## 2023-04-26 NOTE — Therapy (Signed)
OUTPATIENT PHYSICAL THERAPY TREATMENT NOTE   Patient Name: Krystal Moore MRN: [id number] DOB:Aug 23, 1930, 89 y.o., female Today's Date: 04/27/2023  PCP: Marcine Matar, MD REFERRING PROVIDER: Marcine Matar, MD  END OF SESSION:   PT End of Session - 04/27/23 1039     Visit Number 7    Number of Visits 17    Date for PT Re-Evaluation 06/01/23    Authorization Type [employer] MEDICARE HMO/PPO    PT Start Time 1023    PT Stop Time 1102    PT Time Calculation (min) 39 min    Activity Tolerance Patient tolerated treatment well    Behavior During Therapy WFL for tasks assessed/performed               Past Medical History:  Diagnosis Date   Anemia    Narcolepsy    Neuropathy    Obesity    Pneumonia    Polyneuropathy in other diseases classified elsewhere [facility])    Shoulder fracture, left    Past Surgical History:  Procedure Laterality Date   CATARACT EXTRACTION Left 06/2019   INTRAMEDULLARY (IM) NAIL INTERTROCHANTERIC Right 06/08/2022   Procedure: INTRAMEDULLARY (IM) NAIL INTERTROCHANTERIC;  Surgeon: Samson Frederic, MD;  Location: [facility];  Service: Orthopedics;  Laterality: Right;   MOUTH SURGERY     NASAL SINUS SURGERY     Patient Active Problem List   Diagnosis Date Noted   CTS (carpal tunnel syndrome) 08/16/2022   Closed right hip fracture (HCC) 06/07/2022   Fall at home, initial encounter 06/07/2022   Numbness 07/30/2019   Spinal stenosis of lumbar region 07/30/2019   Osteopenia after menopause 05/29/2019   Over weight 05/29/2019   Elevated blood pressure reading 05/29/2019   Immunization due 08/03/2017   Left leg swelling 05/04/2017   Allergic rhinitis 10/11/2015   Hypothyroidism 12/10/2013   Peripheral neuropathy 05/16/2013    REFERRING DIAG:  Diagnosis  R26.9 (ICD-10-CM) - Gait disturbance    THERAPY DIAG:  Difficulty in walking, not elsewhere classified  Muscle weakness (generalized)  Rationale for Evaluation and Treatment  Rehabilitation  PERTINENT HISTORY: see above   PRECAUTIONS: falls , balance  SUBJECTIVE:                                                                                                                                                                                      SUBJECTIVE STATEMENT:  Pt reports better standing balance with dressing, and completing HEP therex daily  PAIN:  Are you having pain? Yes: NPRS scale: none /10 Pain location: hips when she does her standing LE exercises  Pain description: sore, burning  Aggravating factors: exercises  Relieving factors:  rest   OBJECTIVE: (objective measures completed at initial evaluation unless otherwise dated)  DIAGNOSTIC FINDINGS:  DG hip 06/08/22 IMPRESSION: Post ORIF of proximal RIGHT femur.   DG Knee 06/07/22 IMPRESSION: No acute fracture or dislocation.   PATIENT SURVEYS:  FOTO: Perceived function   48%, predicted   54%    COGNITION: Overall cognitive status: Within functional limits for tasks assessed                         SENSATION: Light touch: Impaired L ant thigh   EDEMA:  NT   MUSCLE LENGTH: Hamstrings: Right NT deg; Left NT deg Maisie Fus test: Right NT deg; Left NT deg   POSTURE: rounded shoulders, forward head, decreased lumbar lordosis, increased thoracic kyphosis, and flexed trunk    LOWER EXTREMITY ROM: Grossly WNLs Active ROM Right eval Left eval  Hip flexion      Hip extension      Hip abduction      Hip adduction      Hip internal rotation      Hip external rotation      Knee flexion      Knee extension      Ankle dorsiflexion      Ankle plantarflexion      Ankle inversion      Ankle eversion       (Blank rows = not tested)   LOWER EXTREMITY MMT:   MMT Right eval Left eval  Hip flexion 4+ 4  Hip extension 3- 3-  Hip abduction 4 4-  Hip adduction 4 4+  Hip internal rotation 4+ 4+  Hip external rotation 4 4-  Knee flexion 5 5  Knee extension 5 5  Ankle dorsiflexion 5 5   Ankle plantarflexion 4+ 4+  Ankle inversion      Ankle eversion       (Blank rows = not tested)   LOWER EXTREMITY SPECIAL TESTS:  NT   FUNCTIONAL TESTS:  5 times sit to stand: 11.8 WNLs s use of hands 2 minute walk test: 371ft Standing narrow: 10 sec declined Standing EC, normal base: 10 sec WNLs Single leg stance: 2" each   GAIT: Distance walked: 340' Assistive device utilized: None Level of assistance: Complete Independence Comments: Somewhat short of breath     TODAY'S TREATMENT:   [facility] Adult PT Treatment:                                                DATE: 04/27/23 Therapeutic Exercise: STS 2x10 c 5lb above shoulder press Shoulder ER RTB 2x10 Shoulder abd star pattern GTB 2x10 Standing SL stance balance, 9 sec max with balance adjustments Self Care: Instruction in the application and use of a postural feedback device which the pt brought from home  [facility] Adult PT Treatment:                                                DATE: 04/24/23 Therapeutic Exercise: NuStep L4 UE and LE for 7 min  Seated T-spine extension into foam roller  Shoulder flexion (double arm) with red looped band  x 8  Shoulder chest press with red loop x 8  Seated  row 2 x 15 blue band with thoracic ext.  Standing row hold with high march  Shoulder ext. Green band x 10  Horizontal pull yellow band x 10  Supine LTR arms out x 10  Supine red band march  Supine red band clam   [facility] Adult PT Treatment:                                                DATE: 04/19/23 Therapeutic Exercise: Supine March 10 x 2  Supine clam blue band 10 x 2  Banded Bridge blue 10 x 2  LTR x 10 Ball Squeeze x 10 Seated lumbar flexion  Standing hip abdct  red band 10 x 2  Standing hip ext red band 10 x 2  Mini squats at sink  Hip flexion x 10 each Seated h/s stretch with strap  Supine thomas stretch each side  Seated hip hinge Seated pelvic rocking  Neuromuscular re-ed: Narrow stance on AIREX with head turns  and nods  Self Care: Avoid sacral sitting: Seated posture towel roll at lumbar, support using chair back   [facility] Adult PT Treatment:                                                DATE: 04/17/23 Therapeutic Exercise: Nustep  Standing heel raises x 15  Standing march  x 15 each  Standing hip abdct x 15 each Standing h/s curls x 10 each  Standning hip ext x 15 each Mini sink squat LAQ x 15 each  Supine [address] Ball squeeze  L quad set Green band clam Hip flexor stretch left , EOM    BERG BALANCE  Sitting to Standing: Numbers; 0-4: 4 2. Standing unsupported: Numbers; 0-4: 4 3. Sitting unsupported: Numbers; 0-4: 4 4. Standing to Sitting: Numbers; 0-4: 4              5. Transfers: Numbers; 0-4: 4 6. Standing with eyes closed: Numbers; 0-4: 4 7. Standing with feet together: Numbers; 0-4: 3 8.Reaching forward with outstretched arm: Numbers; 0-4: 3 9. Retrieving object from the floor: Numbers; 0-4: 3 10 Turning to look behind: Numbers; 0-4: 2 11. Turning 360 degrees: Numbers; 0-4: 2 12. Place alternate foot on stool: Numbers; 0-4: 2 13.Standing with one foot in front: Numbers; 0-4: 3 14. Standing on one foot: Numbers; 0-4: 3  Total Score: 46/56  #10 due to spine stiffness with trunk rotation  Self Care: Need for cane, implications of falling, environmental challenges for balance and fall risk                                                                                     PATIENT EDUCATION:  Education details: Eval findings, POC, HEP, self care  Person educated: Patient Education method: Explanation, Demonstration, Tactile cues, Verbal cues, and Handouts Education comprehension: verbalized understanding, returned demonstration, verbal cues required, and  tactile cues required   HOME EXERCISE PROGRAM:  Access Code: [facility] URL: [email or web address] Date: 04/24/2023 Prepared by: Karie Mainland  Exercises - Standing Hip Abduction with Counter  Support  - 1 x daily - 7 x weekly - 2 sets - 10 reps - 2 hold - Standing Single Leg Stance with Counter Support  - 1 x daily - 7 x weekly - 1 sets - 3 reps - 20 hold - Sit to Stand Without Arm Support  - 1 x daily - 7 x weekly - 2 sets - 10 reps - 2 hold - Hooklying Clamshell with Resistance  - 1 x daily - 7 x weekly - 2 sets - 10 reps - 2 hold - Supine Bridge  - 1 x daily - 7 x weekly - 2 sets - 10 reps - 2 hold - Modified Thomas Stretch  - 1 x daily - 7 x weekly - 1 sets - 5 reps - 10 hold - Long Sitting Calf Stretch with Strap  - 1 x daily - 7 x weekly - 1 sets - 5-10 reps - 5-10 hold - Standing Row with Anchored Resistance  - 1 x daily - 7 x weekly - 2 sets - 10 reps - 5 hold - Shoulder extension with resistance - Neutral  - 1 x daily - 7 x weekly - 2 sets - 10 reps - 5 hold  ASSESSMENT:   CLINICAL IMPRESSION:  Pt was able to put on the postural feedback device and voiced understanding of its use. Therex was then completed for trunk, upper body and lower strengthening, posture and balance. With SL stance pt, pt demonstrates improved balance, and pt's 2WMT distance improved as well. Pt is making appropriate progress. Rest was needed between each exercise due to SOB. Pt tolerated PT today without adverse effects. Pt will continue to benefit from skilled PT to address impairments for improved functional mobility.  OBJECTIVE IMPAIRMENTS: decreased activity tolerance, decreased balance, difficulty walking, decreased strength, postural dysfunction, obesity, and pain.    ACTIVITY LIMITATIONS: carrying, lifting, bending, standing, squatting, and locomotion level   PARTICIPATION LIMITATIONS: meal prep, cleaning, laundry, shopping, community activity, and yard work   PERSONAL FACTORS: Age, Fitness, Past/current experiences, Time since onset of injury/illness/exacerbation, and 1-2 comorbidities: High BMI, R hip fx- IM nail repair 06/08/22  are also affecting patient's functional outcome.    REHAB  POTENTIAL: Good   CLINICAL DECISION MAKING: Evolving/moderate complexity   EVALUATION COMPLEXITY: Moderate     GOALS:   SHORT TERM GOALS: Target date: 04/27/23 Pt will be Ind in an initial HEP  Baseline: started Goal status: MET   LONG TERM GOALS: Target date: 06/01/23   Pt will be Ind in a final HEP to maintain achieved LOF  Baseline: started Goal status: ongoing   2.  Improve by MCID of 70ft as indication of improved functional mobility  Baseline: 32ft Status: 494ft  Goal status: Improved    3.  Increased bilat hip ext strength to 3+ and abd, ER to 4+ for improved balance and functional mobility Baseline: see flow sheets Goal status: ongoing    4.  Pt's Berg balance test will increase by 5 points as indication of improved balance Baseline: 46/56 Goal status: ongoing    5.  Pt's FOTO score will improved to the predicted value of 54% as indication of improved function  Baseline: 48% Goal status: INITIAL PLAN:   PT FREQUENCY: 2x/week   PT DURATION: 8 weeks   PLANNED INTERVENTIONS: Therapeutic  exercises, Therapeutic activity, Neuromuscular re-education, Balance training, Gait training, Patient/Family education, Self Care, Joint mobilization, Aquatic Therapy, Dry Needling, Cognitive remediation, Cryotherapy, Moist heat, Taping, Ultrasound, Ionotophoresis 4mg /ml Dexamethasone, Manual therapy, and Re-evaluation   PLAN FOR NEXT SESSION: balance.  Include supine trunk rotation and head turns.  Airex/ more dynamic balance .  assess response to HEP; progress therex as indicated; use of modalities, manual therapy; and TPDN as indicated.          Stefen Juba MS, PT 04/27/23 11:31 AM

## 2023-04-27 ENCOUNTER — Ambulatory Visit: Payer: Medicare HMO

## 2023-04-27 DIAGNOSIS — R262 Difficulty in walking, not elsewhere classified: Secondary | ICD-10-CM | POA: Diagnosis not present

## 2023-04-27 DIAGNOSIS — M25631 Stiffness of right wrist, not elsewhere classified: Secondary | ICD-10-CM | POA: Diagnosis not present

## 2023-04-27 DIAGNOSIS — M25641 Stiffness of right hand, not elsewhere classified: Secondary | ICD-10-CM | POA: Diagnosis not present

## 2023-04-27 DIAGNOSIS — M6281 Muscle weakness (generalized): Secondary | ICD-10-CM | POA: Diagnosis not present

## 2023-04-27 DIAGNOSIS — R208 Other disturbances of skin sensation: Secondary | ICD-10-CM | POA: Diagnosis not present

## 2023-05-08 NOTE — Therapy (Signed)
OUTPATIENT PHYSICAL THERAPY TREATMENT NOTE   Patient Name: Krystal Moore MRN: [id number] DOB:07-02-1930, 89 y.o., female Today's Date: 05/09/2023  PCP: Marcine Matar, MD REFERRING PROVIDER: Marcine Matar, MD  END OF SESSION:   PT End of Session - 05/09/23 1113     Visit Number 8    Number of Visits 17    Date for PT Re-Evaluation 06/01/23    Authorization Type [employer] MEDICARE HMO/PPO    PT Start Time 1110    PT Stop Time 1250    PT Time Calculation (min) 100 min    Activity Tolerance Patient tolerated treatment well    Behavior During Therapy WFL for tasks assessed/performed                Past Medical History:  Diagnosis Date   Anemia    Narcolepsy    Neuropathy    Obesity    Pneumonia    Polyneuropathy in other diseases classified elsewhere [facility])    Shoulder fracture, left    Past Surgical History:  Procedure Laterality Date   CATARACT EXTRACTION Left 06/2019   INTRAMEDULLARY (IM) NAIL INTERTROCHANTERIC Right 06/08/2022   Procedure: INTRAMEDULLARY (IM) NAIL INTERTROCHANTERIC;  Surgeon: Samson Frederic, MD;  Location: [facility];  Service: Orthopedics;  Laterality: Right;   MOUTH SURGERY     NASAL SINUS SURGERY     Patient Active Problem List   Diagnosis Date Noted   CTS (carpal tunnel syndrome) 08/16/2022   Closed right hip fracture (HCC) 06/07/2022   Fall at home, initial encounter 06/07/2022   Numbness 07/30/2019   Spinal stenosis of lumbar region 07/30/2019   Osteopenia after menopause 05/29/2019   Over weight 05/29/2019   Elevated blood pressure reading 05/29/2019   Immunization due 08/03/2017   Left leg swelling 05/04/2017   Allergic rhinitis 10/11/2015   Hypothyroidism 12/10/2013   Peripheral neuropathy 05/16/2013    REFERRING DIAG:  Diagnosis  R26.9 (ICD-10-CM) - Gait disturbance    THERAPY DIAG:  Difficulty in walking, not elsewhere classified  Muscle weakness (generalized)  Rationale for Evaluation and Treatment  Rehabilitation  PERTINENT HISTORY: see above   PRECAUTIONS: falls , balance  SUBJECTIVE:                                                                                                                                                                                      SUBJECTIVE STATEMENT:  Pt reports she has been using the postural reminder device. Pt does report falling  last week slipping on wet graass and hitting the side of her L eye. She saw her eye doctor earlier todat and he found no issues.  PAIN:  Are  you having pain? Yes: NPRS scale: none /10 Pain location: hips when she does her standing LE exercises  Pain description: sore, burning  Aggravating factors: exercises  Relieving factors: rest   OBJECTIVE: (objective measures completed at initial evaluation unless otherwise dated)  DIAGNOSTIC FINDINGS:  DG hip 06/08/22 IMPRESSION: Post ORIF of proximal RIGHT femur.   DG Knee 06/07/22 IMPRESSION: No acute fracture or dislocation.   PATIENT SURVEYS:  FOTO: Perceived function   48%, predicted   54%    COGNITION: Overall cognitive status: Within functional limits for tasks assessed                         SENSATION: Light touch: Impaired L ant thigh   EDEMA:  NT   MUSCLE LENGTH: Hamstrings: Right NT deg; Left NT deg Maisie Fus test: Right NT deg; Left NT deg   POSTURE: rounded shoulders, forward head, decreased lumbar lordosis, increased thoracic kyphosis, and flexed trunk    LOWER EXTREMITY ROM: Grossly WNLs Active ROM Right eval Left eval  Hip flexion      Hip extension      Hip abduction      Hip adduction      Hip internal rotation      Hip external rotation      Knee flexion      Knee extension      Ankle dorsiflexion      Ankle plantarflexion      Ankle inversion      Ankle eversion       (Blank rows = not tested)   LOWER EXTREMITY MMT:   MMT Right eval Left eval  Hip flexion 4+ 4  Hip extension 3- 3-  Hip abduction 4 4-  Hip adduction 4  4+  Hip internal rotation 4+ 4+  Hip external rotation 4 4-  Knee flexion 5 5  Knee extension 5 5  Ankle dorsiflexion 5 5  Ankle plantarflexion 4+ 4+  Ankle inversion      Ankle eversion       (Blank rows = not tested)   LOWER EXTREMITY SPECIAL TESTS:  NT   FUNCTIONAL TESTS:  5 times sit to stand: 11.8 WNLs s use of hands 2 minute walk test: 31ft Standing narrow: 10 sec declined Standing EC, normal base: 10 sec WNLs Single leg stance: 2" each 04/17/23 Berg Balance = Total Score: 46/56  #10 due to spine stiffness with trunk rotation   GAIT: Distance walked: 340' Assistive device utilized: None Level of assistance: Complete Independence Comments: Somewhat short of breath     TODAY'S TREATMENT:   [facility] Adult PT Treatment:                                                DATE: 05/09/23 Therapeutic Exercise: STS 2x10 c 5lb above shoulder press Shoulder wall slides for trunk ext x10 Shoulder ER RTB 2x10 Shoulder abd star pattern GTB 2x10 Wall angels x10 Ankle DF/PF 2x10 Standing Hip abd 2x15 4# each Therapeutic Activity: Low hurdle side steps c // bar assist SL standing  Tandem standing  [facility] Adult PT Treatment:  DATE: 04/27/23 Therapeutic Exercise: STS 2x10 c 5lb above shoulder press Shoulder ER RTB 2x10 Shoulder abd star pattern GTB 2x10 Standing SL stance balance, 9 sec max with balance adjustments Self Care: Instruction in the application and use of a postural feedback device which the pt brought from home  [facility] Adult PT Treatment:                                                DATE: 04/24/23 Therapeutic Exercise: NuStep L4 UE and LE for 7 min  Seated T-spine extension into foam roller  Shoulder flexion (double arm) with red looped band  x 8  Shoulder chest press with red loop x 8  Seated row 2 x 15 blue band with thoracic ext.  Standing row hold with high march  Shoulder ext. Green band x 10  Horizontal pull  yellow band x 10  Supine LTR arms out x 10  Supine red band march  Supine red band clam   [facility] Adult PT Treatment:                                                DATE: 04/19/23 Therapeutic Exercise: Supine March 10 x 2  Supine clam blue band 10 x 2  Banded Bridge blue 10 x 2  LTR x 10 Ball Squeeze x 10 Seated lumbar flexion  Standing hip abdct  red band 10 x 2  Standing hip ext red band 10 x 2  Mini squats at sink  Hip flexion x 10 each Seated h/s stretch with strap  Supine thomas stretch each side  Seated hip hinge Seated pelvic rocking  Neuromuscular re-ed: Narrow stance on AIREX with head turns and nods  Self Care: Avoid sacral sitting: Seated posture towel roll at lumbar, support using chair back                                                                                 PATIENT EDUCATION:  Education details: Eval findings, POC, HEP, self care  Person educated: Patient Education method: Explanation, Demonstration, Tactile cues, Verbal cues, and Handouts Education comprehension: verbalized understanding, returned demonstration, verbal cues required, and tactile cues required   HOME EXERCISE PROGRAM:  Access Code: [facility] URL: [email or web address] Date: 04/24/2023 Prepared by: Karie Mainland  Exercises - Standing Hip Abduction with Counter Support  - 1 x daily - 7 x weekly - 2 sets - 10 reps - 2 hold - Standing Single Leg Stance with Counter Support  - 1 x daily - 7 x weekly - 1 sets - 3 reps - 20 hold - Sit to Stand Without Arm Support  - 1 x daily - 7 x weekly - 2 sets - 10 reps - 2 hold - Hooklying Clamshell with Resistance  - 1 x daily - 7 x weekly - 2 sets - 10 reps - 2 hold - Supine Bridge  - 1 x  daily - 7 x weekly - 2 sets - 10 reps - 2 hold - Modified Thomas Stretch  - 1 x daily - 7 x weekly - 1 sets - 5 reps - 10 hold - Long Sitting Calf Stretch with Strap  - 1 x daily - 7 x weekly - 1 sets - 5-10 reps - 5-10 hold - Standing Row with  Anchored Resistance  - 1 x daily - 7 x weekly - 2 sets - 10 reps - 5 hold - Shoulder extension with resistance - Neutral  - 1 x daily - 7 x weekly - 2 sets - 10 reps - 5 hold  ASSESSMENT:   CLINICAL IMPRESSION:  Pt has started wearing her postural reminder harness. Pt participated in PT for postural, trunk, and LE strengthening and balance. For lateral stepping with low hurdles, pt need // bar assist. Pt tolerated the prescribed therex without adverse effects. Pt will continue to benefit from skilled PT to address impairments for improved functional mobility.  OBJECTIVE IMPAIRMENTS: decreased activity tolerance, decreased balance, difficulty walking, decreased strength, postural dysfunction, obesity, and pain.    ACTIVITY LIMITATIONS: carrying, lifting, bending, standing, squatting, and locomotion level   PARTICIPATION LIMITATIONS: meal prep, cleaning, laundry, shopping, community activity, and yard work   PERSONAL FACTORS: Age, Fitness, Past/current experiences, Time since onset of injury/illness/exacerbation, and 1-2 comorbidities: High BMI, R hip fx- IM nail repair 06/08/22  are also affecting patient's functional outcome.    REHAB POTENTIAL: Good   CLINICAL DECISION MAKING: Evolving/moderate complexity   EVALUATION COMPLEXITY: Moderate     GOALS:   SHORT TERM GOALS: Target date: 04/27/23 Pt will be Ind in an initial HEP  Baseline: started Goal status: MET   LONG TERM GOALS: Target date: 06/01/23   Pt will be Ind in a final HEP to maintain achieved LOF  Baseline: started Goal status: ongoing   2.  Improve by MCID of 58ft as indication of improved functional mobility  Baseline: 353ft Status: 436ft  Goal status: Improved    3.  Increased bilat hip ext strength to 3+ and abd, ER to 4+ for improved balance and functional mobility Baseline: see flow sheets Goal status: ongoing    4.  Pt's Berg balance test will increase by 5 points as indication of improved  balance Baseline: 46/56 Goal status: ongoing    5.  Pt's FOTO score will improved to the predicted value of 54% as indication of improved function  Baseline: 48% Goal status: INITIAL PLAN:   PT FREQUENCY: 2x/week   PT DURATION: 8 weeks   PLANNED INTERVENTIONS: Therapeutic exercises, Therapeutic activity, Neuromuscular re-education, Balance training, Gait training, Patient/Family education, Self Care, Joint mobilization, Aquatic Therapy, Dry Needling, Cognitive remediation, Cryotherapy, Moist heat, Taping, Ultrasound, Ionotophoresis 4mg /ml Dexamethasone, Manual therapy, and Re-evaluation   PLAN FOR NEXT SESSION: balance.  Include supine trunk rotation and head turns.  Airex/ more dynamic balance .  assess response to HEP; progress therex as indicated; use of modalities, manual therapy; and TPDN as indicated.         Moksh Loomer MS, PT 05/09/23 11:51 AM

## 2023-05-09 ENCOUNTER — Ambulatory Visit: Payer: Medicare HMO

## 2023-05-09 DIAGNOSIS — R262 Difficulty in walking, not elsewhere classified: Secondary | ICD-10-CM | POA: Diagnosis not present

## 2023-05-09 DIAGNOSIS — H353132 Nonexudative age-related macular degeneration, bilateral, intermediate dry stage: Secondary | ICD-10-CM | POA: Diagnosis not present

## 2023-05-09 DIAGNOSIS — R208 Other disturbances of skin sensation: Secondary | ICD-10-CM | POA: Diagnosis not present

## 2023-05-09 DIAGNOSIS — M25641 Stiffness of right hand, not elsewhere classified: Secondary | ICD-10-CM | POA: Diagnosis not present

## 2023-05-09 DIAGNOSIS — M25631 Stiffness of right wrist, not elsewhere classified: Secondary | ICD-10-CM | POA: Diagnosis not present

## 2023-05-09 DIAGNOSIS — M6281 Muscle weakness (generalized): Secondary | ICD-10-CM | POA: Diagnosis not present

## 2023-05-09 DIAGNOSIS — H1045 Other chronic allergic conjunctivitis: Secondary | ICD-10-CM | POA: Diagnosis not present

## 2023-05-09 DIAGNOSIS — Z961 Presence of intraocular lens: Secondary | ICD-10-CM | POA: Diagnosis not present

## 2023-05-09 DIAGNOSIS — H04123 Dry eye syndrome of bilateral lacrimal glands: Secondary | ICD-10-CM | POA: Diagnosis not present

## 2023-05-10 NOTE — Therapy (Signed)
OUTPATIENT PHYSICAL THERAPY TREATMENT NOTE   Patient Name: Krystal Moore MRN: [id number] DOB:11-23-1929, 89 y.o., female Today's Date: 05/11/2023  PCP: Marcine Matar, MD REFERRING PROVIDER: Marcine Matar, MD  END OF SESSION:   PT End of Session - 05/11/23 1111     Visit Number 9    Number of Visits 17    Date for PT Re-Evaluation 06/01/23    Authorization Type [employer] MEDICARE HMO/PPO    PT Start Time 1106    PT Stop Time 1147    PT Time Calculation (min) 41 min    Activity Tolerance Patient tolerated treatment well    Behavior During Therapy WFL for tasks assessed/performed                 Past Medical History:  Diagnosis Date   Anemia    Narcolepsy    Neuropathy    Obesity    Pneumonia    Polyneuropathy in other diseases classified elsewhere [facility])    Shoulder fracture, left    Past Surgical History:  Procedure Laterality Date   CATARACT EXTRACTION Left 06/2019   INTRAMEDULLARY (IM) NAIL INTERTROCHANTERIC Right 06/08/2022   Procedure: INTRAMEDULLARY (IM) NAIL INTERTROCHANTERIC;  Surgeon: Samson Frederic, MD;  Location: [facility];  Service: Orthopedics;  Laterality: Right;   MOUTH SURGERY     NASAL SINUS SURGERY     Patient Active Problem List   Diagnosis Date Noted   CTS (carpal tunnel syndrome) 08/16/2022   Closed right hip fracture (HCC) 06/07/2022   Fall at home, initial encounter 06/07/2022   Numbness 07/30/2019   Spinal stenosis of lumbar region 07/30/2019   Osteopenia after menopause 05/29/2019   Over weight 05/29/2019   Elevated blood pressure reading 05/29/2019   Immunization due 08/03/2017   Left leg swelling 05/04/2017   Allergic rhinitis 10/11/2015   Hypothyroidism 12/10/2013   Peripheral neuropathy 05/16/2013    REFERRING DIAG:  Diagnosis  R26.9 (ICD-10-CM) - Gait disturbance    THERAPY DIAG:  Difficulty in walking, not elsewhere classified  Muscle weakness (generalized)  Stiffness of right hand, not elsewhere  classified  Stiffness of right wrist, not elsewhere classified  Other disturbances of skin sensation  Rationale for Evaluation and Treatment Rehabilitation  PERTINENT HISTORY: see above   PRECAUTIONS: falls , balance  SUBJECTIVE:                                                                                                                                                                                      SUBJECTIVE STATEMENT:  " I think I'm getting stronger".Marland Kitchen  PAIN:  Are you having pain? Yes: NPRS scale: none /10 Pain location: hips when  she does her standing LE exercises  Pain description: sore, burning  Aggravating factors: exercises  Relieving factors: rest   OBJECTIVE: (objective measures completed at initial evaluation unless otherwise dated)  DIAGNOSTIC FINDINGS:  DG hip 06/08/22 IMPRESSION: Post ORIF of proximal RIGHT femur.   DG Knee 06/07/22 IMPRESSION: No acute fracture or dislocation.   PATIENT SURVEYS:  FOTO: Perceived function   48%, predicted   54%    COGNITION: Overall cognitive status: Within functional limits for tasks assessed                         SENSATION: Light touch: Impaired L ant thigh   EDEMA:  NT   MUSCLE LENGTH: Hamstrings: Right NT deg; Left NT deg Maisie Fus test: Right NT deg; Left NT deg   POSTURE: rounded shoulders, forward head, decreased lumbar lordosis, increased thoracic kyphosis, and flexed trunk    LOWER EXTREMITY ROM: Grossly WNLs Active ROM Right eval Left eval  Hip flexion      Hip extension      Hip abduction      Hip adduction      Hip internal rotation      Hip external rotation      Knee flexion      Knee extension      Ankle dorsiflexion      Ankle plantarflexion      Ankle inversion      Ankle eversion       (Blank rows = not tested)   LOWER EXTREMITY MMT:   MMT Right eval Left eval RT LT  Hip flexion 4+ 4 5- 4+  Hip extension 3- 3- 4 4  Hip abduction 4 4- 4+ 4+  Hip adduction 4 4+ 5 5   Hip internal rotation 4+ 4+ 5 5  Hip external rotation 4 4- 4+ 4+  Knee flexion 5 5    Knee extension 5 5    Ankle dorsiflexion 5 5    Ankle plantarflexion 4+ 4+    Ankle inversion        Ankle eversion         (Blank rows = not tested)   LOWER EXTREMITY SPECIAL TESTS:  NT   FUNCTIONAL TESTS:  5 times sit to stand: 11.8 WNLs s use of hands 2 minute walk test: 344ft Standing narrow: 10 sec declined Standing EC, normal base: 10 sec WNLs Single leg stance: 2" each 04/17/23 Berg Balance = Total Score: 46/56  #10 due to spine stiffness with trunk rotation   GAIT: Distance walked: 340' Assistive device utilized: None Level of assistance: Complete Independence Comments: Somewhat short of breath     TODAY'S TREATMENT:   [facility] Adult PT Treatment:                                                DATE: 05/11/23 Therapeutic Exercise: Nustep 5 mins L5 UE/LE Bridging 2x10 Supine clams 2x10 GTB STS 2x10 c 5lb above shoulder press Shoulder abd star pattern GTB 2x10 Ankle DF/PF 2x10 MMT LEs Therapeutic Activity: Marching on airex Tandem standing on airex  [facility] Adult PT Treatment:  DATE: 05/09/23 Therapeutic Exercise: STS 2x10 c 5lb above shoulder press Shoulder wall slides for trunk ext x10 Shoulder ER RTB 2x10 Shoulder abd star pattern GTB 2x10 Wall angels x10 Ankle DF/PF 2x10 Standing Hip abd 2x15 4# each Therapeutic Activity: Low hurdle side steps c // bar assist SL standing  Tandem standing  [facility] Adult PT Treatment:                                                DATE: 04/27/23 Therapeutic Exercise: STS 2x10 c 5lb above shoulder press Shoulder ER RTB 2x10 Shoulder abd star pattern GTB 2x10 Standing SL stance balance, 9 sec max with balance adjustments Self Care: Instruction in the application and use of a postural feedback device which the pt brought from home                                                                               PATIENT EDUCATION:  Education details: Eval findings, POC, HEP, self care  Person educated: Patient Education method: Explanation, Demonstration, Tactile cues, Verbal cues, and Handouts Education comprehension: verbalized understanding, returned demonstration, verbal cues required, and tactile cues required   HOME EXERCISE PROGRAM:  Access Code: [facility] URL: [email or web address] Date: 04/24/2023 Prepared by: Karie Mainland  Exercises - Standing Hip Abduction with Counter Support  - 1 x daily - 7 x weekly - 2 sets - 10 reps - 2 hold - Standing Single Leg Stance with Counter Support  - 1 x daily - 7 x weekly - 1 sets - 3 reps - 20 hold - Sit to Stand Without Arm Support  - 1 x daily - 7 x weekly - 2 sets - 10 reps - 2 hold - Hooklying Clamshell with Resistance  - 1 x daily - 7 x weekly - 2 sets - 10 reps - 2 hold - Supine Bridge  - 1 x daily - 7 x weekly - 2 sets - 10 reps - 2 hold - Modified Thomas Stretch  - 1 x daily - 7 x weekly - 1 sets - 5 reps - 10 hold - Long Sitting Calf Stretch with Strap  - 1 x daily - 7 x weekly - 1 sets - 5-10 reps - 5-10 hold - Standing Row with Anchored Resistance  - 1 x daily - 7 x weekly - 2 sets - 10 reps - 5 hold - Shoulder extension with resistance - Neutral  - 1 x daily - 7 x weekly - 2 sets - 10 reps - 5 hold  ASSESSMENT:   CLINICAL IMPRESSION:  PT was continued for postural, trunk, and LE strengthening and balance. MMT revealed increased hip strength meeting the LTG #3. Pt is making appropriate progress with PT intervention. Pt tolerated the prescribed therex without adverse effects. Pt experiences short of breath with some therex. Rest breaks were given as needed. Pt will continue to benefit from skilled PT to address impairments for improved functional mobility.  OBJECTIVE IMPAIRMENTS: decreased activity tolerance, decreased balance, difficulty walking, decreased strength, postural dysfunction, obesity, and pain.  ACTIVITY LIMITATIONS: carrying, lifting, bending, standing, squatting, and locomotion level   PARTICIPATION LIMITATIONS: meal prep, cleaning, laundry, shopping, community activity, and yard work   PERSONAL FACTORS: Age, Fitness, Past/current experiences, Time since onset of injury/illness/exacerbation, and 1-2 comorbidities: High BMI, R hip fx- IM nail repair 06/08/22  are also affecting patient's functional outcome.    REHAB POTENTIAL: Good   CLINICAL DECISION MAKING: Evolving/moderate complexity   EVALUATION COMPLEXITY: Moderate     GOALS:   SHORT TERM GOALS: Target date: 04/27/23 Pt will be Ind in an initial HEP  Baseline: started Goal status: MET   LONG TERM GOALS: Target date: 06/01/23   Pt will be Ind in a final HEP to maintain achieved LOF  Baseline: started Goal status: ongoing   2.  Improve by MCID of 55ft as indication of improved functional mobility  Baseline: 335ft Status: 419ft  Goal status: Improved    3.  Increased bilat hip ext strength to 3+ and abd, ER to 4+ for improved balance and functional mobility Baseline: see flow sheets 05/11/23: see flow sheets Goal status: MET    4.  Pt's Berg balance test will increase by 5 points as indication of improved balance Baseline: 46/56 Goal status: ongoing    5.  Pt's FOTO score will improved to the predicted value of 54% as indication of improved function  Baseline: 48% Goal status: INITIAL PLAN:   PT FREQUENCY: 2x/week   PT DURATION: 8 weeks   PLANNED INTERVENTIONS: Therapeutic exercises, Therapeutic activity, Neuromuscular re-education, Balance training, Gait training, Patient/Family education, Self Care, Joint mobilization, Aquatic Therapy, Dry Needling, Cognitive remediation, Cryotherapy, Moist heat, Taping, Ultrasound, Ionotophoresis 4mg /ml Dexamethasone, Manual therapy, and Re-evaluation   PLAN FOR NEXT SESSION: balance.  Include supine trunk rotation and head turns.  Airex/ more dynamic balance .   assess response to HEP; progress therex as indicated; use of modalities, manual therapy; and TPDN as indicated.         Myranda Pavone MS, PT 05/11/23 8:18 PM

## 2023-05-11 ENCOUNTER — Ambulatory Visit: Payer: Medicare HMO

## 2023-05-11 DIAGNOSIS — M25641 Stiffness of right hand, not elsewhere classified: Secondary | ICD-10-CM | POA: Diagnosis not present

## 2023-05-11 DIAGNOSIS — R208 Other disturbances of skin sensation: Secondary | ICD-10-CM | POA: Diagnosis not present

## 2023-05-11 DIAGNOSIS — R262 Difficulty in walking, not elsewhere classified: Secondary | ICD-10-CM | POA: Diagnosis not present

## 2023-05-11 DIAGNOSIS — M25631 Stiffness of right wrist, not elsewhere classified: Secondary | ICD-10-CM

## 2023-05-11 DIAGNOSIS — M6281 Muscle weakness (generalized): Secondary | ICD-10-CM

## 2023-05-14 NOTE — Therapy (Signed)
OUTPATIENT PHYSICAL THERAPY TREATMENT NOTE/Progress Note   Patient Name: Krystal Moore MRN: [id number] DOB:1930-10-02, 89 y.o., female Today's Date: 05/15/2023  PCP: Marcine Matar, MD REFERRING PROVIDER: Marcine Matar, MD  Progress Note Reporting Period 03/28/23 to 05/15/23  See note below for Objective Data and Assessment of Progress/Goals.      END OF SESSION:   PT End of Session - 05/15/23 1425     Visit Number 10    Number of Visits 17    Date for PT Re-Evaluation 06/01/23    Authorization Type [employer] MEDICARE HMO/PPO    PT Start Time 1418    PT Stop Time 1500    PT Time Calculation (min) 42 min    Activity Tolerance Patient tolerated treatment well    Behavior During Therapy WFL for tasks assessed/performed                  Past Medical History:  Diagnosis Date   Anemia    Narcolepsy    Neuropathy    Obesity    Pneumonia    Polyneuropathy in other diseases classified elsewhere [facility])    Shoulder fracture, left    Past Surgical History:  Procedure Laterality Date   CATARACT EXTRACTION Left 06/2019   INTRAMEDULLARY (IM) NAIL INTERTROCHANTERIC Right 06/08/2022   Procedure: INTRAMEDULLARY (IM) NAIL INTERTROCHANTERIC;  Surgeon: Samson Frederic, MD;  Location: [facility];  Service: Orthopedics;  Laterality: Right;   MOUTH SURGERY     NASAL SINUS SURGERY     Patient Active Problem List   Diagnosis Date Noted   CTS (carpal tunnel syndrome) 08/16/2022   Closed right hip fracture (HCC) 06/07/2022   Fall at home, initial encounter 06/07/2022   Numbness 07/30/2019   Spinal stenosis of lumbar region 07/30/2019   Osteopenia after menopause 05/29/2019   Over weight 05/29/2019   Elevated blood pressure reading 05/29/2019   Immunization due 08/03/2017   Left leg swelling 05/04/2017   Allergic rhinitis 10/11/2015   Hypothyroidism 12/10/2013   Peripheral neuropathy 05/16/2013    REFERRING DIAG:  Diagnosis  R26.9 (ICD-10-CM) - Gait disturbance     THERAPY DIAG:  Difficulty in walking, not elsewhere classified  Muscle weakness (generalized)  Other disturbances of skin sensation  Rationale for Evaluation and Treatment Rehabilitation  PERTINENT HISTORY: see above   PRECAUTIONS: falls , balance  SUBJECTIVE:                                                                                                                                                                                      SUBJECTIVE STATEMENT:  " My neck is feeling better with completing the posture exercises.  PAIN:  Are you having pain? Yes: NPRS scale: none /10 Pain location: hips when she does her standing LE exercises  Pain description: sore, burning  Aggravating factors: exercises  Relieving factors: rest   OBJECTIVE: (objective measures completed at initial evaluation unless otherwise dated)  DIAGNOSTIC FINDINGS:  DG hip 06/08/22 IMPRESSION: Post ORIF of proximal RIGHT femur.   DG Knee 06/07/22 IMPRESSION: No acute fracture or dislocation.   PATIENT SURVEYS:  FOTO: Perceived function   48%, predicted   54%    COGNITION: Overall cognitive status: Within functional limits for tasks assessed                         SENSATION: Light touch: Impaired L ant thigh   EDEMA:  NT   MUSCLE LENGTH: Hamstrings: Right NT deg; Left NT deg Maisie Fus test: Right NT deg; Left NT deg   POSTURE: rounded shoulders, forward head, decreased lumbar lordosis, increased thoracic kyphosis, and flexed trunk    LOWER EXTREMITY ROM: Grossly WNLs Active ROM Right eval Left eval  Hip flexion      Hip extension      Hip abduction      Hip adduction      Hip internal rotation      Hip external rotation      Knee flexion      Knee extension      Ankle dorsiflexion      Ankle plantarflexion      Ankle inversion      Ankle eversion       (Blank rows = not tested)   LOWER EXTREMITY MMT:   MMT Right eval Left eval RT LT  Hip flexion 4+ 4 5- 4+  Hip  extension 3- 3- 4 4  Hip abduction 4 4- 4+ 4+  Hip adduction 4 4+ 5 5  Hip internal rotation 4+ 4+ 5 5  Hip external rotation 4 4- 4+ 4+  Knee flexion 5 5    Knee extension 5 5    Ankle dorsiflexion 5 5    Ankle plantarflexion 4+ 4+    Ankle inversion        Ankle eversion         (Blank rows = not tested)   LOWER EXTREMITY SPECIAL TESTS:  NT   FUNCTIONAL TESTS:  5 times sit to stand: 11.8 WNLs s use of hands 2 minute walk test: 328ft Standing narrow: 10 sec declined Standing EC, normal base: 10 sec WNLs Single leg stance: 2" each 04/17/23 Berg Balance = Total Score: 46/56  #10 due to spine stiffness with trunk rotation   GAIT: Distance walked: 340' Assistive device utilized: None Level of assistance: Complete Independence Comments: Somewhat short of breath     TODAY'S TREATMENT:   [facility] Adult PT Treatment:                                                DATE: 05/15/23 Therapeutic Exercise: Nustep 5 mins L5 UE/LE Standing chin tucks x10 c small ball against wall STS 2x10 c 5lb above shoulder press Shoulder abd star pattern GTB 2x10 Shoulder ER pattern GTB 2x10 Ankle DF/PF 2x10 Lateral side steps c RTB at ankles 41ft Therapeutic Activity: Marching on airex FOTO- completion and review  [facility] Adult PT Treatment:  DATE: 05/11/23 Therapeutic Exercise: Nustep 5 mins L5 UE/LE Bridging 2x10 Supine clams 2x10 GTB STS 2x10 c 5lb above shoulder press Shoulder abd star pattern GTB 2x10 Ankle DF/PF 2x10 MMT LEs Therapeutic Activity: Marching on airex Tandem standing on airex  [facility] Adult PT Treatment:                                                DATE: 05/09/23 Therapeutic Exercise: STS 2x10 c 5lb above shoulder press Shoulder wall slides for trunk ext x10 Shoulder ER RTB 2x10 Shoulder abd star pattern GTB 2x10 Wall angels x10 Ankle DF/PF 2x10 Standing Hip abd 2x15 4# each Therapeutic Activity: Low hurdle side steps c //  bar assist SL standing  Tandem standing                                                                       PATIENT EDUCATION:  Education details: Eval findings, POC, HEP, self care  Person educated: Patient Education method: Explanation, Demonstration, Tactile cues, Verbal cues, and Handouts Education comprehension: verbalized understanding, returned demonstration, verbal cues required, and tactile cues required   HOME EXERCISE PROGRAM:  Access Code: [facility] URL: [email or web address] Date: 04/24/2023 Prepared by: Karie Mainland  Exercises - Standing Hip Abduction with Counter Support  - 1 x daily - 7 x weekly - 2 sets - 10 reps - 2 hold - Standing Single Leg Stance with Counter Support  - 1 x daily - 7 x weekly - 1 sets - 3 reps - 20 hold - Sit to Stand Without Arm Support  - 1 x daily - 7 x weekly - 2 sets - 10 reps - 2 hold - Hooklying Clamshell with Resistance  - 1 x daily - 7 x weekly - 2 sets - 10 reps - 2 hold - Supine Bridge  - 1 x daily - 7 x weekly - 2 sets - 10 reps - 2 hold - Modified Thomas Stretch  - 1 x daily - 7 x weekly - 1 sets - 5 reps - 10 hold - Long Sitting Calf Stretch with Strap  - 1 x daily - 7 x weekly - 1 sets - 5-10 reps - 5-10 hold - Standing Row with Anchored Resistance  - 1 x daily - 7 x weekly - 2 sets - 10 reps - 5 hold - Shoulder extension with resistance - Neutral  - 1 x daily - 7 x weekly - 2 sets - 10 reps - 5 hold  ASSESSMENT:   CLINICAL IMPRESSION:  Re-assessed FOTO with patient meeting the predicted value. Improved FOTO score is consistent with strength and functional gains the pt has already made. PT was completed for postural, trunk, and LE strengthening and balance activities.  Pt tolerated the prescribed therex without adverse effects. Pt will continue to benefit from skilled PT to address impairments for improved functional mobility and safety.  OBJECTIVE IMPAIRMENTS: decreased activity tolerance, decreased balance,  difficulty walking, decreased strength, postural dysfunction, obesity, and pain.    ACTIVITY LIMITATIONS: carrying, lifting, bending, standing, squatting, and locomotion level   PARTICIPATION LIMITATIONS: meal prep, cleaning, laundry,  shopping, community activity, and yard work   PERSONAL FACTORS: Age, Fitness, Past/current experiences, Time since onset of injury/illness/exacerbation, and 1-2 comorbidities: High BMI, R hip fx- IM nail repair 06/08/22  are also affecting patient's functional outcome.    REHAB POTENTIAL: Good   CLINICAL DECISION MAKING: Evolving/moderate complexity   EVALUATION COMPLEXITY: Moderate     GOALS:   SHORT TERM GOALS: Target date: 04/27/23 Pt will be Ind in an initial HEP  Baseline: started Goal status: MET   LONG TERM GOALS: Target date: 06/01/23   Pt will be Ind in a final HEP to maintain achieved LOF  Baseline: started Goal status: ongoing   2.  Improve by MCID of 72ft as indication of improved functional mobility  Baseline: 321ft Status: 458ft  Goal status: Improved    3.  Increased bilat hip ext strength to 3+ and abd, ER to 4+ for improved balance and functional mobility Baseline: see flow sheets 05/11/23: see flow sheets Goal status: MET    4.  Pt's Berg balance test will increase by 5 points as indication of improved balance Baseline: 46/56 Goal status: ongoing    5.  Pt's FOTO score will improved to the predicted value of 54% as indication of improved function  Baseline: 48% Goal status: MET 05/15/23 PLAN:   PT FREQUENCY: 2x/week   PT DURATION: 8 weeks   PLANNED INTERVENTIONS: Therapeutic exercises, Therapeutic activity, Neuromuscular re-education, Balance training, Gait training, Patient/Family education, Self Care, Joint mobilization, Aquatic Therapy, Dry Needling, Cognitive remediation, Cryotherapy, Moist heat, Taping, Ultrasound, Ionotophoresis 4mg /ml Dexamethasone, Manual therapy, and Re-evaluation   PLAN FOR NEXT SESSION:  balance.  Include supine trunk rotation and head turns.  Airex/ more dynamic balance .  assess response to HEP; progress therex as indicated; use of modalities, manual therapy; and TPDN as indicated.         Kiam Bransfield MS, PT 05/15/23 3:20 PM

## 2023-05-15 ENCOUNTER — Ambulatory Visit: Payer: Medicare HMO

## 2023-05-15 DIAGNOSIS — R208 Other disturbances of skin sensation: Secondary | ICD-10-CM | POA: Diagnosis not present

## 2023-05-15 DIAGNOSIS — R262 Difficulty in walking, not elsewhere classified: Secondary | ICD-10-CM

## 2023-05-15 DIAGNOSIS — M6281 Muscle weakness (generalized): Secondary | ICD-10-CM

## 2023-05-15 DIAGNOSIS — M25641 Stiffness of right hand, not elsewhere classified: Secondary | ICD-10-CM | POA: Diagnosis not present

## 2023-05-15 DIAGNOSIS — M25631 Stiffness of right wrist, not elsewhere classified: Secondary | ICD-10-CM | POA: Diagnosis not present

## 2023-05-15 NOTE — Therapy (Signed)
OUTPATIENT PHYSICAL THERAPY TREATMENT NOTE/Progress Note   Patient Name: Krystal Moore MRN: [id number] DOB:Oct 09, 1930, 89 y.o., female Today's Date: 05/15/2023  PCP: Marcine Matar, MD REFERRING PROVIDER: Marcine Matar, MD  Progress Note Reporting Period 03/28/23 to 05/15/23  See note below for Objective Data and Assessment of Progress/Goals.      END OF SESSION:          Past Medical History:  Diagnosis Date   Anemia    Narcolepsy    Neuropathy    Obesity    Pneumonia    Polyneuropathy in other diseases classified elsewhere [facility])    Shoulder fracture, left    Past Surgical History:  Procedure Laterality Date   CATARACT EXTRACTION Left 06/2019   INTRAMEDULLARY (IM) NAIL INTERTROCHANTERIC Right 06/08/2022   Procedure: INTRAMEDULLARY (IM) NAIL INTERTROCHANTERIC;  Surgeon: Samson Frederic, MD;  Location: [facility];  Service: Orthopedics;  Laterality: Right;   MOUTH SURGERY     NASAL SINUS SURGERY     Patient Active Problem List   Diagnosis Date Noted   CTS (carpal tunnel syndrome) 08/16/2022   Closed right hip fracture (HCC) 06/07/2022   Fall at home, initial encounter 06/07/2022   Numbness 07/30/2019   Spinal stenosis of lumbar region 07/30/2019   Osteopenia after menopause 05/29/2019   Over weight 05/29/2019   Elevated blood pressure reading 05/29/2019   Immunization due 08/03/2017   Left leg swelling 05/04/2017   Allergic rhinitis 10/11/2015   Hypothyroidism 12/10/2013   Peripheral neuropathy 05/16/2013    REFERRING DIAG:  Diagnosis  R26.9 (ICD-10-CM) - Gait disturbance    THERAPY DIAG:  No diagnosis found.  Rationale for Evaluation and Treatment Rehabilitation  PERTINENT HISTORY: see above   PRECAUTIONS: falls , balance  SUBJECTIVE:                                                                                                                                                                                      SUBJECTIVE  STATEMENT:  " My neck is feeling better with completing the posture exercises.  PAIN:  Are you having pain? Yes: NPRS scale: none /10 Pain location: hips when she does her standing LE exercises  Pain description: sore, burning  Aggravating factors: exercises  Relieving factors: rest   OBJECTIVE: (objective measures completed at initial evaluation unless otherwise dated)  DIAGNOSTIC FINDINGS:  DG hip 06/08/22 IMPRESSION: Post ORIF of proximal RIGHT femur.   DG Knee 06/07/22 IMPRESSION: No acute fracture or dislocation.   PATIENT SURVEYS:  FOTO: Perceived function   48%, predicted   54%    COGNITION: Overall cognitive status: Within functional limits for tasks assessed  SENSATION: Light touch: Impaired L ant thigh   EDEMA:  NT   MUSCLE LENGTH: Hamstrings: Right NT deg; Left NT deg Maisie Fus test: Right NT deg; Left NT deg   POSTURE: rounded shoulders, forward head, decreased lumbar lordosis, increased thoracic kyphosis, and flexed trunk    LOWER EXTREMITY ROM: Grossly WNLs Active ROM Right eval Left eval  Hip flexion      Hip extension      Hip abduction      Hip adduction      Hip internal rotation      Hip external rotation      Knee flexion      Knee extension      Ankle dorsiflexion      Ankle plantarflexion      Ankle inversion      Ankle eversion       (Blank rows = not tested)   LOWER EXTREMITY MMT:   MMT Right eval Left eval RT LT  Hip flexion 4+ 4 5- 4+  Hip extension 3- 3- 4 4  Hip abduction 4 4- 4+ 4+  Hip adduction 4 4+ 5 5  Hip internal rotation 4+ 4+ 5 5  Hip external rotation 4 4- 4+ 4+  Knee flexion 5 5    Knee extension 5 5    Ankle dorsiflexion 5 5    Ankle plantarflexion 4+ 4+    Ankle inversion        Ankle eversion         (Blank rows = not tested)   LOWER EXTREMITY SPECIAL TESTS:  NT   FUNCTIONAL TESTS:  5 times sit to stand: 11.8 WNLs s use of hands 2 minute walk test: 339ft Standing narrow: 10  sec declined Standing EC, normal base: 10 sec WNLs Single leg stance: 2" each 04/17/23 Berg Balance = Total Score: 46/56  #10 due to spine stiffness with trunk rotation   GAIT: Distance walked: 340' Assistive device utilized: None Level of assistance: Complete Independence Comments: Somewhat short of breath     TODAY'S TREATMENT:  [facility] Adult PT Treatment:                                                DATE: 05/17/23 Therapeutic Exercise: *** Manual Therapy: *** Neuromuscular re-ed: *** Therapeutic Activity: *** Modalities: *** Self Care: ***    [facility] Adult PT Treatment:                                                DATE: 05/15/23 Therapeutic Exercise: Nustep 5 mins L5 UE/LE Standing chin tucks x10 c small ball against wall STS 2x10 c 5lb above shoulder press Shoulder abd star pattern GTB 2x10 Shoulder ER pattern GTB 2x10 Ankle DF/PF 2x10 Lateral side steps c RTB at ankles 65ft Therapeutic Activity: Marching on airex FOTO- completion and review  [facility] Adult PT Treatment:                                                DATE: 05/11/23 Therapeutic Exercise: Nustep 5 mins L5 UE/LE Bridging 2x10 Supine clams  2x10 GTB STS 2x10 c 5lb above shoulder press Shoulder abd star pattern GTB 2x10 Ankle DF/PF 2x10 MMT LEs Therapeutic Activity: Marching on airex Tandem standing on airex  [facility] Adult PT Treatment:                                                DATE: 05/09/23 Therapeutic Exercise: STS 2x10 c 5lb above shoulder press Shoulder wall slides for trunk ext x10 Shoulder ER RTB 2x10 Shoulder abd star pattern GTB 2x10 Wall angels x10 Ankle DF/PF 2x10 Standing Hip abd 2x15 4# each Therapeutic Activity: Low hurdle side steps c // bar assist SL standing  Tandem standing                                                                       PATIENT EDUCATION:  Education details: Eval findings, POC, HEP, self care  Person educated: Patient Education method: Explanation,  Demonstration, Tactile cues, Verbal cues, and Handouts Education comprehension: verbalized understanding, returned demonstration, verbal cues required, and tactile cues required   HOME EXERCISE PROGRAM:  Access Code: [facility] URL: [email or web address] Date: 04/24/2023 Prepared by: Karie Mainland  Exercises - Standing Hip Abduction with Counter Support  - 1 x daily - 7 x weekly - 2 sets - 10 reps - 2 hold - Standing Single Leg Stance with Counter Support  - 1 x daily - 7 x weekly - 1 sets - 3 reps - 20 hold - Sit to Stand Without Arm Support  - 1 x daily - 7 x weekly - 2 sets - 10 reps - 2 hold - Hooklying Clamshell with Resistance  - 1 x daily - 7 x weekly - 2 sets - 10 reps - 2 hold - Supine Bridge  - 1 x daily - 7 x weekly - 2 sets - 10 reps - 2 hold - Modified Thomas Stretch  - 1 x daily - 7 x weekly - 1 sets - 5 reps - 10 hold - Long Sitting Calf Stretch with Strap  - 1 x daily - 7 x weekly - 1 sets - 5-10 reps - 5-10 hold - Standing Row with Anchored Resistance  - 1 x daily - 7 x weekly - 2 sets - 10 reps - 5 hold - Shoulder extension with resistance - Neutral  - 1 x daily - 7 x weekly - 2 sets - 10 reps - 5 hold  ASSESSMENT:   CLINICAL IMPRESSION:  Re-assessed FOTO with patient meeting the predicted value. Improved FOTO score is consistent with strength and functional gains the pt has already made. PT was completed for postural, trunk, and LE strengthening and balance activities.  Pt tolerated the prescribed therex without adverse effects. Pt will continue to benefit from skilled PT to address impairments for improved functional mobility and safety.  OBJECTIVE IMPAIRMENTS: decreased activity tolerance, decreased balance, difficulty walking, decreased strength, postural dysfunction, obesity, and pain.    ACTIVITY LIMITATIONS: carrying, lifting, bending, standing, squatting, and locomotion level   PARTICIPATION LIMITATIONS: meal prep, cleaning, laundry, shopping,  community activity, and yard work   PERSONAL FACTORS: Age, Fitness, Past/current  experiences, Time since onset of injury/illness/exacerbation, and 1-2 comorbidities: High BMI, R hip fx- IM nail repair 06/08/22  are also affecting patient's functional outcome.    REHAB POTENTIAL: Good   CLINICAL DECISION MAKING: Evolving/moderate complexity   EVALUATION COMPLEXITY: Moderate     GOALS:   SHORT TERM GOALS: Target date: 04/27/23 Pt will be Ind in an initial HEP  Baseline: started Goal status: MET   LONG TERM GOALS: Target date: 06/01/23   Pt will be Ind in a final HEP to maintain achieved LOF  Baseline: started Goal status: ongoing   2.  Improve by MCID of 58ft as indication of improved functional mobility  Baseline: 328ft Status: 418ft  Goal status: Improved    3.  Increased bilat hip ext strength to 3+ and abd, ER to 4+ for improved balance and functional mobility Baseline: see flow sheets 05/11/23: see flow sheets Goal status: MET    4.  Pt's Berg balance test will increase by 5 points as indication of improved balance Baseline: 46/56 Goal status: ongoing    5.  Pt's FOTO score will improved to the predicted value of 54% as indication of improved function  Baseline: 48% Goal status: MET 05/15/23 PLAN:   PT FREQUENCY: 2x/week   PT DURATION: 8 weeks   PLANNED INTERVENTIONS: Therapeutic exercises, Therapeutic activity, Neuromuscular re-education, Balance training, Gait training, Patient/Family education, Self Care, Joint mobilization, Aquatic Therapy, Dry Needling, Cognitive remediation, Cryotherapy, Moist heat, Taping, Ultrasound, Ionotophoresis 4mg /ml Dexamethasone, Manual therapy, and Re-evaluation   PLAN FOR NEXT SESSION: balance.  Include supine trunk rotation and head turns.  Airex/ more dynamic balance .  assess response to HEP; progress therex as indicated; use of modalities, manual therapy; and TPDN as indicated.         Azka Steger MS, PT 05/15/23 10:04  PM

## 2023-05-16 ENCOUNTER — Ambulatory Visit: Admission: RE | Admit: 2023-05-16 | Payer: Medicare HMO | Source: Ambulatory Visit

## 2023-05-16 ENCOUNTER — Telehealth: Payer: Self-pay | Admitting: Internal Medicine

## 2023-05-16 ENCOUNTER — Other Ambulatory Visit: Payer: Self-pay | Admitting: Internal Medicine

## 2023-05-16 DIAGNOSIS — M8588 Other specified disorders of bone density and structure, other site: Secondary | ICD-10-CM | POA: Diagnosis not present

## 2023-05-16 DIAGNOSIS — N958 Other specified menopausal and perimenopausal disorders: Secondary | ICD-10-CM | POA: Diagnosis not present

## 2023-05-16 DIAGNOSIS — Z780 Asymptomatic menopausal state: Secondary | ICD-10-CM

## 2023-05-16 DIAGNOSIS — E349 Endocrine disorder, unspecified: Secondary | ICD-10-CM | POA: Diagnosis not present

## 2023-05-16 MED ORDER — ALENDRONATE SODIUM 70 MG PO TABS
70.0000 mg | ORAL_TABLET | ORAL | 11 refills | Status: DC
Start: 2023-05-16 — End: 2024-04-16

## 2023-05-16 NOTE — Telephone Encounter (Signed)
Phone call placed to patient today to go over the results of her bone density study.  I left a message informing her of who I am and that we will reach out to her again with her results.  Message sent to my CMA with patient's results.

## 2023-05-17 ENCOUNTER — Ambulatory Visit: Payer: Medicare HMO

## 2023-05-17 DIAGNOSIS — R262 Difficulty in walking, not elsewhere classified: Secondary | ICD-10-CM | POA: Diagnosis not present

## 2023-05-17 DIAGNOSIS — M6281 Muscle weakness (generalized): Secondary | ICD-10-CM

## 2023-05-17 DIAGNOSIS — M25641 Stiffness of right hand, not elsewhere classified: Secondary | ICD-10-CM | POA: Diagnosis not present

## 2023-05-17 DIAGNOSIS — R208 Other disturbances of skin sensation: Secondary | ICD-10-CM | POA: Diagnosis not present

## 2023-05-17 DIAGNOSIS — M25631 Stiffness of right wrist, not elsewhere classified: Secondary | ICD-10-CM | POA: Diagnosis not present

## 2023-05-21 NOTE — Therapy (Signed)
OUTPATIENT PHYSICAL THERAPY TREATMENT NOTE   Patient Name: Krystal Moore MRN: [id number] DOB:Jun 26, 1930, 89 y.o., female Today's Date: 05/22/2023  PCP: Marcine Matar, MD REFERRING PROVIDER: Marcine Matar, MD   See note below for Objective Data and Assessment of Progress/Goals.      END OF SESSION:   PT End of Session - 05/22/23 1426     Visit Number 12    Number of Visits 17    Date for PT Re-Evaluation 06/01/23    Authorization Type [employer] MEDICARE HMO/PPO    PT Start Time 1420    PT Stop Time 1500    PT Time Calculation (min) 40 min    Activity Tolerance Patient tolerated treatment well    Behavior During Therapy WFL for tasks assessed/performed                    Past Medical History:  Diagnosis Date   Anemia    Narcolepsy    Neuropathy    Obesity    Pneumonia    Polyneuropathy in other diseases classified elsewhere [facility])    Shoulder fracture, left    Past Surgical History:  Procedure Laterality Date   CATARACT EXTRACTION Left 06/2019   INTRAMEDULLARY (IM) NAIL INTERTROCHANTERIC Right 06/08/2022   Procedure: INTRAMEDULLARY (IM) NAIL INTERTROCHANTERIC;  Surgeon: Samson Frederic, MD;  Location: [facility];  Service: Orthopedics;  Laterality: Right;   MOUTH SURGERY     NASAL SINUS SURGERY     Patient Active Problem List   Diagnosis Date Noted   CTS (carpal tunnel syndrome) 08/16/2022   Closed right hip fracture (HCC) 06/07/2022   Fall at home, initial encounter 06/07/2022   Numbness 07/30/2019   Spinal stenosis of lumbar region 07/30/2019   Osteopenia after menopause 05/29/2019   Over weight 05/29/2019   Elevated blood pressure reading 05/29/2019   Immunization due 08/03/2017   Left leg swelling 05/04/2017   Allergic rhinitis 10/11/2015   Hypothyroidism 12/10/2013   Peripheral neuropathy 05/16/2013    REFERRING DIAG:  Diagnosis  R26.9 (ICD-10-CM) - Gait disturbance    THERAPY DIAG:  Difficulty in walking, not elsewhere  classified  Muscle weakness (generalized)  Other disturbances of skin sensation  Rationale for Evaluation and Treatment Rehabilitation  PERTINENT HISTORY: see above   PRECAUTIONS: falls , balance  SUBJECTIVE:                                                                                                                                                                                      SUBJECTIVE STATEMENT:  Pt reports she is walking 2 blocks further now when walking her dog.  PAIN:  Are you having  pain? Yes: NPRS scale: none /10 Pain location: hips when she does her standing LE exercises  Pain description: sore, burning  Aggravating factors: exercises  Relieving factors: rest   OBJECTIVE: (objective measures completed at initial evaluation unless otherwise dated)  DIAGNOSTIC FINDINGS:  DG hip 06/08/22 IMPRESSION: Post ORIF of proximal RIGHT femur.   DG Knee 06/07/22 IMPRESSION: No acute fracture or dislocation.   PATIENT SURVEYS:  FOTO: Perceived function   48%, predicted   54%    COGNITION: Overall cognitive status: Within functional limits for tasks assessed                         SENSATION: Light touch: Impaired L ant thigh   EDEMA:  NT   MUSCLE LENGTH: Hamstrings: Right NT deg; Left NT deg Maisie Fus test: Right NT deg; Left NT deg   POSTURE: rounded shoulders, forward head, decreased lumbar lordosis, increased thoracic kyphosis, and flexed trunk    LOWER EXTREMITY ROM: Grossly WNLs Active ROM Right eval Left eval  Hip flexion      Hip extension      Hip abduction      Hip adduction      Hip internal rotation      Hip external rotation      Knee flexion      Knee extension      Ankle dorsiflexion      Ankle plantarflexion      Ankle inversion      Ankle eversion       (Blank rows = not tested)   LOWER EXTREMITY MMT:   MMT Right eval Left eval RT LT  Hip flexion 4+ 4 5- 4+  Hip extension 3- 3- 4 4  Hip abduction 4 4- 4+ 4+  Hip  adduction 4 4+ 5 5  Hip internal rotation 4+ 4+ 5 5  Hip external rotation 4 4- 4+ 4+  Knee flexion 5 5    Knee extension 5 5    Ankle dorsiflexion 5 5    Ankle plantarflexion 4+ 4+    Ankle inversion        Ankle eversion         (Blank rows = not tested)   LOWER EXTREMITY SPECIAL TESTS:  NT   FUNCTIONAL TESTS:  5 times sit to stand: 11.8 WNLs s use of hands 2 minute walk test: 358ft Standing narrow: 10 sec declined Standing EC, normal base: 10 sec WNLs Single leg stance: 2" each 04/17/23 Berg Balance = Total Score: 46/56  #10 due to spine stiffness with trunk rotation   GAIT: Distance walked: 340' Assistive device utilized: None Level of assistance: Complete Independence Comments: Somewhat short of breath     TODAY'S TREATMENT:  [facility] Adult PT Treatment:                                                DATE: 05/22/23 Therapeutic Exercise: Nustep 5 mins L6 UE/LE LAQ 2x10 5# each Lateral step ups 6' 2x10 eack STS 2x10 c 5lb above shoulder press Ankle DF/PF 2x10 Therapeutic Activity: Heel to toe walking  [facility] Adult PT Treatment:  DATE: 05/17/23 Therapeutic Exercise: Nustep 8 mins L5 UE/LE Lateral step ups 6' 2x10 eack STS 2x10 c 5lb above shoulder press Shoulder abd star pattern GTB 2x10 Shoulder ER pattern GTB 2x10 Ankle DF/PF 2x10 Therapeutic Activity: BERG BALANCE= Total Score: 54/56  [facility] Adult PT Treatment:                                                DATE: 05/15/23 Therapeutic Exercise: Nustep 5 mins L5 UE/LE Standing chin tucks x10 c small ball against wall STS 2x10 c 5lb above shoulder press Shoulder abd star pattern GTB 2x10 Shoulder ER pattern GTB 2x10 Ankle DF/PF 2x10 Lateral side steps c RTB at ankles 5ft Therapeutic Activity: Marching on airex FOTO- completion and review    PATIENT EDUCATION:  Education details: Eval findings, POC, HEP, self care  Person educated: Patient Education method:  Explanation, Demonstration, Tactile cues, Verbal cues, and Handouts Education comprehension: verbalized understanding, returned demonstration, verbal cues required, and tactile cues required   HOME EXERCISE PROGRAM:  Access Code: [facility] URL: [email or web address] Date: 04/24/2023 Prepared by: Karie Mainland  Exercises - Standing Hip Abduction with Counter Support  - 1 x daily - 7 x weekly - 2 sets - 10 reps - 2 hold - Standing Single Leg Stance with Counter Support  - 1 x daily - 7 x weekly - 1 sets - 3 reps - 20 hold - Sit to Stand Without Arm Support  - 1 x daily - 7 x weekly - 2 sets - 10 reps - 2 hold - Hooklying Clamshell with Resistance  - 1 x daily - 7 x weekly - 2 sets - 10 reps - 2 hold - Supine Bridge  - 1 x daily - 7 x weekly - 2 sets - 10 reps - 2 hold - Modified Thomas Stretch  - 1 x daily - 7 x weekly - 1 sets - 5 reps - 10 hold - Long Sitting Calf Stretch with Strap  - 1 x daily - 7 x weekly - 1 sets - 5-10 reps - 5-10 hold - Standing Row with Anchored Resistance  - 1 x daily - 7 x weekly - 2 sets - 10 reps - 5 hold - Shoulder extension with resistance - Neutral  - 1 x daily - 7 x weekly - 2 sets - 10 reps - 5 hold  ASSESSMENT:   CLINICAL IMPRESSION:  Re-assessed . There was slight improvement since her last assessment. Overall, this functional test has improved. Re: walking pace, pt appears to be at her max level and not sure if this will improve further. PT was provided today for LE and postural strengthening, and for balance. Pt tolerated PT today without adverse effects.  Pt will continue to benefit from skilled PT to address impairments for improved functional mobility.   OBJECTIVE IMPAIRMENTS: decreased activity tolerance, decreased balance, difficulty walking, decreased strength, postural dysfunction, obesity, and pain.    ACTIVITY LIMITATIONS: carrying, lifting, bending, standing, squatting, and locomotion level   PARTICIPATION LIMITATIONS: meal  prep, cleaning, laundry, shopping, community activity, and yard work   PERSONAL FACTORS: Age, Fitness, Past/current experiences, Time since onset of injury/illness/exacerbation, and 1-2 comorbidities: High BMI, R hip fx- IM nail repair 06/08/22  are also affecting patient's functional outcome.    REHAB POTENTIAL: Good   CLINICAL DECISION MAKING: Evolving/moderate complexity   EVALUATION COMPLEXITY:  Moderate     GOALS:   SHORT TERM GOALS: Target date: 04/27/23 Pt will be Ind in an initial HEP  Baseline: started Goal status: MET   LONG TERM GOALS: Target date: 06/01/23   Pt will be Ind in a final HEP to maintain achieved LOF  Baseline: started Goal status: ongoing   2.  Improve by MCID of 5ft as indication of improved functional mobility  Baseline: 316ft Status: 419ft;05/22/23=410ft Goal status: Improved    3.  Increased bilat hip ext strength to 3+ and abd, ER to 4+ for improved balance and functional mobility Baseline: see flow sheets 05/11/23: see flow sheets Goal status: MET    4.  Pt's Berg balance test will increase by 5 points as indication of improved balance Baseline: 46/56 05/17/23: 54/56 Goal status: MET   5.  Pt's FOTO score will improved to the predicted value of 54% as indication of improved function  Baseline: 48% Goal status: MET 05/15/23 PLAN:   PT FREQUENCY: 2x/week   PT DURATION: 8 weeks   PLANNED INTERVENTIONS: Therapeutic exercises, Therapeutic activity, Neuromuscular re-education, Balance training, Gait training, Patient/Family education, Self Care, Joint mobilization, Aquatic Therapy, Dry Needling, Cognitive remediation, Cryotherapy, Moist heat, Taping, Ultrasound, Ionotophoresis 4mg /ml Dexamethasone, Manual therapy, and Re-evaluation   PLAN FOR NEXT SESSION: balance.  Include supine trunk rotation and head turns.  Airex/ more dynamic balance .  assess response to HEP; progress therex as indicated; use of modalities, manual therapy; and TPDN as  indicated.         Durwood Dittus MS, PT 05/22/23 4:04 PM

## 2023-05-22 ENCOUNTER — Ambulatory Visit: Payer: Medicare HMO

## 2023-05-22 DIAGNOSIS — R262 Difficulty in walking, not elsewhere classified: Secondary | ICD-10-CM | POA: Diagnosis not present

## 2023-05-22 DIAGNOSIS — M25641 Stiffness of right hand, not elsewhere classified: Secondary | ICD-10-CM | POA: Diagnosis not present

## 2023-05-22 DIAGNOSIS — M6281 Muscle weakness (generalized): Secondary | ICD-10-CM

## 2023-05-22 DIAGNOSIS — R208 Other disturbances of skin sensation: Secondary | ICD-10-CM | POA: Diagnosis not present

## 2023-05-22 DIAGNOSIS — M25631 Stiffness of right wrist, not elsewhere classified: Secondary | ICD-10-CM | POA: Diagnosis not present

## 2023-05-24 ENCOUNTER — Encounter: Payer: Self-pay | Admitting: Physical Therapy

## 2023-05-24 ENCOUNTER — Ambulatory Visit: Payer: Medicare HMO | Attending: Internal Medicine | Admitting: Physical Therapy

## 2023-05-24 DIAGNOSIS — R262 Difficulty in walking, not elsewhere classified: Secondary | ICD-10-CM | POA: Insufficient documentation

## 2023-05-24 DIAGNOSIS — R208 Other disturbances of skin sensation: Secondary | ICD-10-CM | POA: Diagnosis not present

## 2023-05-24 DIAGNOSIS — M6281 Muscle weakness (generalized): Secondary | ICD-10-CM | POA: Diagnosis not present

## 2023-05-24 NOTE — Therapy (Signed)
OUTPATIENT PHYSICAL THERAPY TREATMENT NOTE   Patient Name: Krystal Moore MRN: [id number] DOB:1929/12/08, 89 y.o., female Today's Date: 05/24/2023  PCP: Marcine Matar, MD REFERRING PROVIDER: Marcine Matar, MD      END OF SESSION:   PT End of Session - 05/24/23 1112     Visit Number 13    Number of Visits 17    Date for PT Re-Evaluation 06/01/23    Authorization Type [employer] MEDICARE HMO/PPO    PT Start Time 1110    PT Stop Time 1145    PT Time Calculation (min) 35 min    Activity Tolerance Patient tolerated treatment well    Behavior During Therapy WFL for tasks assessed/performed                     Past Medical History:  Diagnosis Date   Anemia    Narcolepsy    Neuropathy    Obesity    Pneumonia    Polyneuropathy in other diseases classified elsewhere [facility])    Shoulder fracture, left    Past Surgical History:  Procedure Laterality Date   CATARACT EXTRACTION Left 06/2019   INTRAMEDULLARY (IM) NAIL INTERTROCHANTERIC Right 06/08/2022   Procedure: INTRAMEDULLARY (IM) NAIL INTERTROCHANTERIC;  Surgeon: Samson Frederic, MD;  Location: [facility];  Service: Orthopedics;  Laterality: Right;   MOUTH SURGERY     NASAL SINUS SURGERY     Patient Active Problem List   Diagnosis Date Noted   CTS (carpal tunnel syndrome) 08/16/2022   Closed right hip fracture (HCC) 06/07/2022   Fall at home, initial encounter 06/07/2022   Numbness 07/30/2019   Spinal stenosis of lumbar region 07/30/2019   Osteopenia after menopause 05/29/2019   Over weight 05/29/2019   Elevated blood pressure reading 05/29/2019   Immunization due 08/03/2017   Left leg swelling 05/04/2017   Allergic rhinitis 10/11/2015   Hypothyroidism 12/10/2013   Peripheral neuropathy 05/16/2013    REFERRING DIAG:  Diagnosis  R26.9 (ICD-10-CM) - Gait disturbance    THERAPY DIAG:  Difficulty in walking, not elsewhere classified  Muscle weakness (generalized)  Other disturbances of  skin sensation  Rationale for Evaluation and Treatment Rehabilitation  PERTINENT HISTORY: see above   PRECAUTIONS: falls , balance  SUBJECTIVE:                                                                                                                                                                                      SUBJECTIVE STATEMENT:  I feel some pain across the low back, below the waistline.  I am sore from the weights. I want to get some weights for home, but I am limited  in grip.   PAIN:  Are you having pain? Yes: NPRS scale: min  /10 Pain location: hips when she does her standing LE exercises  Pain description: sore, burning  Aggravating factors: exercises  Relieving factors: rest   OBJECTIVE: (objective measures completed at initial evaluation unless otherwise dated)  DIAGNOSTIC FINDINGS:  DG hip 06/08/22 IMPRESSION: Post ORIF of proximal RIGHT femur.   DG Knee 06/07/22 IMPRESSION: No acute fracture or dislocation.   PATIENT SURVEYS:  FOTO: Perceived function   48%, predicted   54%    COGNITION: Overall cognitive status: Within functional limits for tasks assessed                         SENSATION: Light touch: Impaired L ant thigh   EDEMA:  NT   MUSCLE LENGTH: Hamstrings: Right NT deg; Left NT deg Maisie Fus test: Right NT deg; Left NT deg   POSTURE: rounded shoulders, forward head, decreased lumbar lordosis, increased thoracic kyphosis, and flexed trunk    LOWER EXTREMITY ROM: Grossly WNLs Active ROM Right eval Left eval  Hip flexion      Hip extension      Hip abduction      Hip adduction      Hip internal rotation      Hip external rotation      Knee flexion      Knee extension      Ankle dorsiflexion      Ankle plantarflexion      Ankle inversion      Ankle eversion       (Blank rows = not tested)   LOWER EXTREMITY MMT:   MMT Right eval Left eval RT LT  Hip flexion 4+ 4 5- 4+  Hip extension 3- 3- 4 4  Hip abduction 4 4- 4+ 4+   Hip adduction 4 4+ 5 5  Hip internal rotation 4+ 4+ 5 5  Hip external rotation 4 4- 4+ 4+  Knee flexion 5 5    Knee extension 5 5    Ankle dorsiflexion 5 5    Ankle plantarflexion 4+ 4+    Ankle inversion        Ankle eversion         (Blank rows = not tested)   LOWER EXTREMITY SPECIAL TESTS:  NT   FUNCTIONAL TESTS:  5 times sit to stand: 11.8 WNLs s use of hands 2 minute walk test: 384ft Standing narrow: 10 sec declined Standing EC, normal base: 10 sec WNLs Single leg stance: 2" each 04/17/23 Berg Balance = Total Score: 46/56  #10 due to spine stiffness with trunk rotation   GAIT: Distance walked: 340' Assistive device utilized: None Level of assistance: Complete Independence Comments: Somewhat short of breath     TODAY'S TREATMENT:   [facility] Adult PT Treatment:                                                DATE: 05/24/23 Therapeutic Exercise: Supine clam green band double leg and single leg  Bridge with band x 15  March x 15 Bridge with march green band x 10 each  Sidelying green band clam x 15 Row and extension green band x 15  Self care Osteoporosis and what to avoid , how to modify her morning routine. Balance, what weights to purchase,  dumbbell vs hand   [facility] Adult PT Treatment:                                                DATE: 05/22/23 Therapeutic Exercise: Nustep 5 mins L6 UE/LE LAQ 2x10 5# each Lateral step ups 6' 2x10 eack STS 2x10 c 5lb above shoulder press Ankle DF/PF 2x10 Therapeutic Activity: Heel to toe walking  [facility] Adult PT Treatment:                                                DATE: 05/17/23 Therapeutic Exercise: Nustep 8 mins L5 UE/LE Lateral step ups 6' 2x10 eack STS 2x10 c 5lb above shoulder press Shoulder abd star pattern GTB 2x10 Shoulder ER pattern GTB 2x10 Ankle DF/PF 2x10 Therapeutic Activity: BERG BALANCE= Total Score: 54/56  [facility] Adult PT Treatment:                                                DATE:  05/15/23 Therapeutic Exercise: Nustep 5 mins L5 UE/LE Standing chin tucks x10 c small ball against wall STS 2x10 c 5lb above shoulder press Shoulder abd star pattern GTB 2x10 Shoulder ER pattern GTB 2x10 Ankle DF/PF 2x10 Lateral side steps c RTB at ankles 67ft Therapeutic Activity: Marching on airex FOTO- completion and review    PATIENT EDUCATION:  Education details: Eval findings, POC, HEP, self care  Person educated: Patient Education method: Explanation, Demonstration, Tactile cues, Verbal cues, and Handouts Education comprehension: verbalized understanding, returned demonstration, verbal cues required, and tactile cues required   HOME EXERCISE PROGRAM:  Access Code: [facility] URL: [email or web address] Date: 04/24/2023 Prepared by: Karie Mainland  Exercises - Standing Hip Abduction with Counter Support  - 1 x daily - 7 x weekly - 2 sets - 10 reps - 2 hold - Standing Single Leg Stance with Counter Support  - 1 x daily - 7 x weekly - 1 sets - 3 reps - 20 hold - Sit to Stand Without Arm Support  - 1 x daily - 7 x weekly - 2 sets - 10 reps - 2 hold - Hooklying Clamshell with Resistance  - 1 x daily - 7 x weekly - 2 sets - 10 reps - 2 hold - Supine Bridge  - 1 x daily - 7 x weekly - 2 sets - 10 reps - 2 hold - Modified Thomas Stretch  - 1 x daily - 7 x weekly - 1 sets - 5 reps - 10 hold - Long Sitting Calf Stretch with Strap  - 1 x daily - 7 x weekly - 1 sets - 5-10 reps - 5-10 hold - Standing Row with Anchored Resistance  - 1 x daily - 7 x weekly - 2 sets - 10 reps - 5 hold - Shoulder extension with resistance - Neutral  - 1 x daily - 7 x weekly - 2 sets - 10 reps - 5 hold  ASSESSMENT:   CLINICAL IMPRESSION:  Patient was a bit late, able to do her home routine. Reviewed home program and discussed balance, safety and new diagnosis  of osteoporosis and how it relates to her daily activities.  She was able to understand how to modify her daily routine , was used to  flexing down toward to floor and bouncing which is contraindicated.    Pt will continue to benefit from skilled PT to address impairments for improved functional mobility.   OBJECTIVE IMPAIRMENTS: decreased activity tolerance, decreased balance, difficulty walking, decreased strength, postural dysfunction, obesity, and pain.    ACTIVITY LIMITATIONS: carrying, lifting, bending, standing, squatting, and locomotion level   PARTICIPATION LIMITATIONS: meal prep, cleaning, laundry, shopping, community activity, and yard work   PERSONAL FACTORS: Age, Fitness, Past/current experiences, Time since onset of injury/illness/exacerbation, and 1-2 comorbidities: High BMI, R hip fx- IM nail repair 06/08/22  are also affecting patient's functional outcome.    REHAB POTENTIAL: Good   CLINICAL DECISION MAKING: Evolving/moderate complexity   EVALUATION COMPLEXITY: Moderate     GOALS:   SHORT TERM GOALS: Target date: 04/27/23 Pt will be Ind in an initial HEP  Baseline: started Goal status: MET   LONG TERM GOALS: Target date: 06/01/23   Pt will be Ind in a final HEP to maintain achieved LOF  Baseline: started Goal status: ongoing   2.  Improve by MCID of 2ft as indication of improved functional mobility  Baseline: 353ft Status: 463ft;05/22/23=410ft Goal status: Improved    3.  Increased bilat hip ext strength to 3+ and abd, ER to 4+ for improved balance and functional mobility Baseline: see flow sheets 05/11/23: see flow sheets Goal status: MET    4.  Pt's Berg balance test will increase by 5 points as indication of improved balance Baseline: 46/56 05/17/23: 54/56 Goal status: MET   5.  Pt's FOTO score will improved to the predicted value of 54% as indication of improved function  Baseline: 48% Goal status: MET 05/15/23 PLAN:   PT FREQUENCY: 2x/week   PT DURATION: 8 weeks   PLANNED INTERVENTIONS: Therapeutic exercises, Therapeutic activity, Neuromuscular re-education, Balance  training, Gait training, Patient/Family education, Self Care, Joint mobilization, Aquatic Therapy, Dry Needling, Cognitive remediation, Cryotherapy, Moist heat, Taping, Ultrasound, Ionotophoresis 4mg /ml Dexamethasone, Manual therapy, and Re-evaluation   PLAN FOR NEXT SESSION: Review med mobility and rolling to side vs flexing. balance.  Include supine trunk rotation and head turns.  Airex/ more dynamic balance .  assess response to HEP; progress therex as indicated; use of modalities, manual therapy; and TPDN as indicated.        Karie Mainland, PT 05/24/23 12:16 PM Phone: [phone] Fax: [phone]

## 2023-05-29 ENCOUNTER — Ambulatory Visit: Payer: Medicare HMO

## 2023-05-29 DIAGNOSIS — M6281 Muscle weakness (generalized): Secondary | ICD-10-CM

## 2023-05-29 DIAGNOSIS — R262 Difficulty in walking, not elsewhere classified: Secondary | ICD-10-CM | POA: Diagnosis not present

## 2023-05-29 DIAGNOSIS — R208 Other disturbances of skin sensation: Secondary | ICD-10-CM | POA: Diagnosis not present

## 2023-05-29 NOTE — Therapy (Signed)
OUTPATIENT PHYSICAL THERAPY TREATMENT NOTE   Patient Name: Krystal Moore MRN: [id number] DOB:1930/05/09, 89 y.o., female Today's Date: 05/29/2023  PCP: Marcine Matar, MD REFERRING PROVIDER: Marcine Matar, MD      END OF SESSION:   PT End of Session - 05/29/23 1157     Visit Number 14    Number of Visits 17    Date for PT Re-Evaluation 06/01/23    Authorization Type [employer] MEDICARE HMO/PPO    PT Start Time 1152    PT Stop Time 1235    PT Time Calculation (min) 43 min    Activity Tolerance Patient tolerated treatment well    Behavior During Therapy WFL for tasks assessed/performed                      Past Medical History:  Diagnosis Date   Anemia    Narcolepsy    Neuropathy    Obesity    Pneumonia    Polyneuropathy in other diseases classified elsewhere [facility])    Shoulder fracture, left    Past Surgical History:  Procedure Laterality Date   CATARACT EXTRACTION Left 06/2019   INTRAMEDULLARY (IM) NAIL INTERTROCHANTERIC Right 06/08/2022   Procedure: INTRAMEDULLARY (IM) NAIL INTERTROCHANTERIC;  Surgeon: Samson Frederic, MD;  Location: [facility];  Service: Orthopedics;  Laterality: Right;   MOUTH SURGERY     NASAL SINUS SURGERY     Patient Active Problem List   Diagnosis Date Noted   CTS (carpal tunnel syndrome) 08/16/2022   Closed right hip fracture (HCC) 06/07/2022   Fall at home, initial encounter 06/07/2022   Numbness 07/30/2019   Spinal stenosis of lumbar region 07/30/2019   Osteopenia after menopause 05/29/2019   Over weight 05/29/2019   Elevated blood pressure reading 05/29/2019   Immunization due 08/03/2017   Left leg swelling 05/04/2017   Allergic rhinitis 10/11/2015   Hypothyroidism 12/10/2013   Peripheral neuropathy 05/16/2013    REFERRING DIAG:  Diagnosis  R26.9 (ICD-10-CM) - Gait disturbance    THERAPY DIAG:  Difficulty in walking, not elsewhere classified  Muscle weakness (generalized)  Other disturbances of  skin sensation  Rationale for Evaluation and Treatment Rehabilitation  PERTINENT HISTORY: see above   PRECAUTIONS: falls , balance  SUBJECTIVE:                                                                                                                                                                                      SUBJECTIVE STATEMENT:  Pt reports being a little more osre working out [employer] at home. She reports both legs feel better and strong.  PAIN:  Are you having pain? Yes:  NPRS scale: min  /10 Pain location: hips when she does her standing LE exercises  Pain description: sore, burning  Aggravating factors: exercises  Relieving factors: rest   OBJECTIVE: (objective measures completed at initial evaluation unless otherwise dated)  DIAGNOSTIC FINDINGS:  DG hip 06/08/22 IMPRESSION: Post ORIF of proximal RIGHT femur.   DG Knee 06/07/22 IMPRESSION: No acute fracture or dislocation.   PATIENT SURVEYS:  FOTO: Perceived function   48%, predicted   54%    COGNITION: Overall cognitive status: Within functional limits for tasks assessed                         SENSATION: Light touch: Impaired L ant thigh   EDEMA:  NT   MUSCLE LENGTH: Hamstrings: Right NT deg; Left NT deg Maisie Fus test: Right NT deg; Left NT deg   POSTURE: rounded shoulders, forward head, decreased lumbar lordosis, increased thoracic kyphosis, and flexed trunk    LOWER EXTREMITY ROM: Grossly WNLs Active ROM Right eval Left eval  Hip flexion      Hip extension      Hip abduction      Hip adduction      Hip internal rotation      Hip external rotation      Knee flexion      Knee extension      Ankle dorsiflexion      Ankle plantarflexion      Ankle inversion      Ankle eversion       (Blank rows = not tested)   LOWER EXTREMITY MMT:   MMT Right eval Left eval RT LT  Hip flexion 4+ 4 5- 4+  Hip extension 3- 3- 4 4  Hip abduction 4 4- 4+ 4+  Hip adduction 4 4+ 5 5  Hip  internal rotation 4+ 4+ 5 5  Hip external rotation 4 4- 4+ 4+  Knee flexion 5 5    Knee extension 5 5    Ankle dorsiflexion 5 5    Ankle plantarflexion 4+ 4+    Ankle inversion        Ankle eversion         (Blank rows = not tested)   LOWER EXTREMITY SPECIAL TESTS:  NT   FUNCTIONAL TESTS:  5 times sit to stand: 11.8 WNLs s use of hands 2 minute walk test: 373ft Standing narrow: 10 sec declined Standing EC, normal base: 10 sec WNLs Single leg stance: 2" each 04/17/23 Berg Balance = Total Score: 46/56  #10 due to spine stiffness with trunk rotation   GAIT: Distance walked: 340' Assistive device utilized: None Level of assistance: Complete Independence Comments: Somewhat short of breath     TODAY'S TREATMENT:  [facility] Adult PT Treatment:                                                DATE: 05/29/23 Therapeutic Exercise: Nustep 5 mins L6 UE/LE Standing shoulder abd star pattern 2x10 RTB Standing shoulder abd star pattern 2x10 RTB Bilat shoulder flexion c wand 2x10 STS 2x10 c 2lb above shoulder press Ankle DF/PF 2x10 Long sit ankle DF stretch c strap x3 20" Updated HEP  [facility] Adult PT Treatment:  DATE: 05/24/23 Therapeutic Exercise: Supine clam green band double leg and single leg  Bridge with band x 15  March x 15 Bridge with march green band x 10 each  Sidelying green band clam x 15 Row and extension green band x 15  Self care Osteoporosis and what to avoid , how to modify her morning routine. Balance, what weights to purchase, dumbbell vs hand   [facility] Adult PT Treatment:                                                DATE: 05/22/23 Therapeutic Exercise: Nustep 5 mins L6 UE/LE LAQ 2x10 5# each Lateral step ups 6' 2x10 eack STS 2x10 c 5lb above shoulder press Ankle DF/PF 2x10 Therapeutic Activity: Heel to toe walking  [facility] Adult PT Treatment:                                                DATE: 05/17/23 Therapeutic  Exercise: Nustep 8 mins L5 UE/LE Lateral step ups 6' 2x10 eack STS 2x10 c 5lb above shoulder press Shoulder abd star pattern GTB 2x10 Shoulder ER pattern GTB 2x10 Ankle DF/PF 2x10 Therapeutic Activity: BERG BALANCE= Total Score: 54/56     PATIENT EDUCATION:  Education details: Eval findings, POC, HEP, self care  Person educated: Patient Education method: Explanation, Demonstration, Tactile cues, Verbal cues, and Handouts Education comprehension: verbalized understanding, returned demonstration, verbal cues required, and tactile cues required   HOME EXERCISE PROGRAM:  Access Code: [facility] URL: [email or web address] Date: 05/29/2023 Prepared by: Joellyn Rued  Exercises - Standing Hip Abduction with Counter Support  - 1 x daily - 7 x weekly - 2 sets - 10 reps - 2 hold - Standing Single Leg Stance with Counter Support  - 1 x daily - 7 x weekly - 1 sets - 3 reps - 20 hold - Sit to Stand Without Arm Support  - 1 x daily - 7 x weekly - 2 sets - 10 reps - 2 hold - Heel Toe Raises with Counter Support  - 1 x daily - 7 x weekly - 2 sets - 10 reps - 23 hold - Hooklying Clamshell with Resistance  - 1 x daily - 7 x weekly - 2 sets - 10 reps - 2 hold - Supine Bridge  - 1 x daily - 7 x weekly - 2 sets - 10 reps - 2 hold - Shoulder External Rotation and Scapular Retraction with Resistance  - 1 x daily - 7 x weekly - 2 sets - 10 reps - 3 hold - Standing Shoulder Horizontal Abduction with Resistance  - 1 x daily - 7 x weekly - 2 sets - 10 reps - 3 hold - Modified Thomas Stretch  - 1 x daily - 7 x weekly - 1 sets - 5 reps - 10 hold - Long Sitting Calf Stretch with Strap  - 1 x daily - 7 x weekly - 1 sets - 5-3 reps - 20 hold  ASSESSMENT:   CLINICAL IMPRESSION:  PT was completed for LE and postural strengthening and balance therex. Reviewed most of the therex and an updated the HEP. Discussed completion schedule of the HEP: To complete approx 1/2 of the therex daily  and then  alternate. Pt voiced understanding of this process. Pt's next PT session is her last scheduled. If pt's is continuing to do well, anticipated DC from PT services.  OBJECTIVE IMPAIRMENTS: decreased activity tolerance, decreased balance, difficulty walking, decreased strength, postural dysfunction, obesity, and pain.    ACTIVITY LIMITATIONS: carrying, lifting, bending, standing, squatting, and locomotion level   PARTICIPATION LIMITATIONS: meal prep, cleaning, laundry, shopping, community activity, and yard work   PERSONAL FACTORS: Age, Fitness, Past/current experiences, Time since onset of injury/illness/exacerbation, and 1-2 comorbidities: High BMI, R hip fx- IM nail repair 06/08/22  are also affecting patient's functional outcome.    REHAB POTENTIAL: Good   CLINICAL DECISION MAKING: Evolving/moderate complexity   EVALUATION COMPLEXITY: Moderate     GOALS:   SHORT TERM GOALS: Target date: 04/27/23 Pt will be Ind in an initial HEP  Baseline: started Goal status: MET   LONG TERM GOALS: Target date: 06/01/23   Pt will be Ind in a final HEP to maintain achieved LOF  Baseline: started Goal status: ongoing   2.  Improve by MCID of 55ft as indication of improved functional mobility  Baseline: 338ft Status: 481ft;05/22/23=410ft Goal status: Improved    3.  Increased bilat hip ext strength to 3+ and abd, ER to 4+ for improved balance and functional mobility Baseline: see flow sheets 05/11/23: see flow sheets Goal status: MET    4.  Pt's Berg balance test will increase by 5 points as indication of improved balance Baseline: 46/56 05/17/23: 54/56 Goal status: MET   5.  Pt's FOTO score will improved to the predicted value of 54% as indication of improved function  Baseline: 48% Goal status: MET 05/15/23 PLAN:   PT FREQUENCY: 2x/week   PT DURATION: 8 weeks   PLANNED INTERVENTIONS: Therapeutic exercises, Therapeutic activity, Neuromuscular re-education, Balance training, Gait  training, Patient/Family education, Self Care, Joint mobilization, Aquatic Therapy, Dry Needling, Cognitive remediation, Cryotherapy, Moist heat, Taping, Ultrasound, Ionotophoresis 4mg /ml Dexamethasone, Manual therapy, and Re-evaluation   PLAN FOR NEXT SESSION: Review med mobility and rolling to side vs flexing. balance.  Include supine trunk rotation and head turns.  Airex/ more dynamic balance .  assess response to HEP; progress therex as indicated; use of modalities, manual therapy; and TPDN as indicated.        Nasir Bright MS, PT 05/29/23 12:57 PM

## 2023-05-30 NOTE — Therapy (Signed)
OUTPATIENT PHYSICAL THERAPY TREATMENT NOTE DISCHARGE   Patient Name: Krystal Moore MRN: [id number] DOB:1930/01/02, 89 y.o., female Today's Date: 05/31/2023  PCP: Marcine Matar, MD REFERRING PROVIDER: Marcine Matar, MD      END OF SESSION:   PT End of Session - 05/31/23 1103     Visit Number 15    Number of Visits 17    Date for PT Re-Evaluation 06/01/23    Authorization Type [employer] MEDICARE HMO/PPO    PT Start Time 1101    PT Stop Time 1145    PT Time Calculation (min) 44 min    Activity Tolerance Patient tolerated treatment well    Behavior During Therapy WFL for tasks assessed/performed                       Past Medical History:  Diagnosis Date   Anemia    Narcolepsy    Neuropathy    Obesity    Pneumonia    Polyneuropathy in other diseases classified elsewhere [facility])    Shoulder fracture, left    Past Surgical History:  Procedure Laterality Date   CATARACT EXTRACTION Left 06/2019   INTRAMEDULLARY (IM) NAIL INTERTROCHANTERIC Right 06/08/2022   Procedure: INTRAMEDULLARY (IM) NAIL INTERTROCHANTERIC;  Surgeon: Samson Frederic, MD;  Location: [facility];  Service: Orthopedics;  Laterality: Right;   MOUTH SURGERY     NASAL SINUS SURGERY     Patient Active Problem List   Diagnosis Date Noted   CTS (carpal tunnel syndrome) 08/16/2022   Closed right hip fracture (HCC) 06/07/2022   Fall at home, initial encounter 06/07/2022   Numbness 07/30/2019   Spinal stenosis of lumbar region 07/30/2019   Osteopenia after menopause 05/29/2019   Over weight 05/29/2019   Elevated blood pressure reading 05/29/2019   Immunization due 08/03/2017   Left leg swelling 05/04/2017   Allergic rhinitis 10/11/2015   Hypothyroidism 12/10/2013   Peripheral neuropathy 05/16/2013    REFERRING DIAG:  Diagnosis  R26.9 (ICD-10-CM) - Gait disturbance    THERAPY DIAG:  Difficulty in walking, not elsewhere classified  Muscle weakness (generalized)  Other  disturbances of skin sensation  Rationale for Evaluation and Treatment Rehabilitation  PERTINENT HISTORY: see above   PRECAUTIONS: falls , balance  SUBJECTIVE:                                                                                                                                                                                      SUBJECTIVE STATEMENT:  Pt feeling really good today.  She got 2 lbs.  Less discomfort in LLE. I want to know how to space the exercises.  PAIN:  Are you having pain? Yes: NPRS scale: none  /10 Pain location: hips when she does her standing LE exercises  Pain description: sore, burning  Aggravating factors: exercises  Relieving factors: rest   OBJECTIVE: (objective measures completed at initial evaluation unless otherwise dated)  DIAGNOSTIC FINDINGS:  DG hip 06/08/22 IMPRESSION: Post ORIF of proximal RIGHT femur.   DG Knee 06/07/22 IMPRESSION: No acute fracture or dislocation.   PATIENT SURVEYS:  FOTO: Perceived function   48%, predicted   54%    COGNITION: Overall cognitive status: Within functional limits for tasks assessed                         SENSATION: Light touch: Impaired L ant thigh   EDEMA:  NT   MUSCLE LENGTH: Hamstrings: Right NT deg; Left NT deg Maisie Fus test: Right NT deg; Left NT deg   POSTURE: rounded shoulders, forward head, decreased lumbar lordosis, increased thoracic kyphosis, and flexed trunk    LOWER EXTREMITY ROM: Grossly WNLs Active ROM Right eval Left eval  Hip flexion      Hip extension      Hip abduction      Hip adduction      Hip internal rotation      Hip external rotation      Knee flexion      Knee extension      Ankle dorsiflexion      Ankle plantarflexion      Ankle inversion      Ankle eversion       (Blank rows = not tested)   LOWER EXTREMITY MMT:   MMT Right eval Left eval RT LT  Hip flexion 4+ 4 5- 4+  Hip extension 3- 3- 4 4  Hip abduction 4 4- 4+ 4+  Hip adduction 4  4+ 5 5  Hip internal rotation 4+ 4+ 5 5  Hip external rotation 4 4- 4+ 4+  Knee flexion 5 5    Knee extension 5 5    Ankle dorsiflexion 5 5    Ankle plantarflexion 4+ 4+    Ankle inversion        Ankle eversion         (Blank rows = not tested)   LOWER EXTREMITY SPECIAL TESTS:  NT   FUNCTIONAL TESTS:  5 times sit to stand: 11.8 WNLs s use of hands 2 minute walk test: 367ft , 05/31/23: 377 feet   Standing narrow: 10 sec declined Standing EC, normal base: 10 sec WNLs Single leg stance: 2" each 04/17/23 Berg Balance = Total Score: 46/56  #10 due to spine stiffness with trunk rotation   GAIT: Distance walked: 340' Assistive device utilized: None Level of assistance: Complete Independence Comments: Somewhat short of breath     TODAY'S TREATMENT:   [facility] Adult PT Treatment:                                                DATE: 05/31/23 Therapeutic Exercise: Supine flexion double arm with dowel x 10 Supine chest press with dowel and 2.5 lbs cuff wgt Cross body reaching 2.5 lbs x 10 LTR with head turns x 10  2 min walk - 377 feet, mod dyspnea  Standing LE therapeutic exercise:  Calf raise x 20 , 2.5 lbs x 15 Standing hip abd and  extension 2.5 lbs High knee march 2.5 lbs  x 10 1 UE assist  Side stepping with ankle wgts   Self Care: Organized HEP for weekly routine, incorporating swim days and a rest day Return if needed with a status change, increased symptoms, fall   [facility] Adult PT Treatment:                                                DATE: 05/29/23 Therapeutic Exercise: Nustep 5 mins L6 UE/LE Standing shoulder abd star pattern 2x10 RTB Standing shoulder abd star pattern 2x10 RTB Bilat shoulder flexion c wand 2x10 STS 2x10 c 2lb above shoulder press Ankle DF/PF 2x10 Long sit ankle DF stretch c strap x3 20" Updated HEP  [facility] Adult PT Treatment:                                                DATE: 05/24/23 Therapeutic Exercise: Supine clam green band double leg and single  leg  Bridge with band x 15  March x 15 Bridge with march green band x 10 each  Sidelying green band clam x 15 Row and extension green band x 15  Self care Osteoporosis and what to avoid , how to modify her morning routine. Balance, what weights to purchase, dumbbell vs hand   [facility] Adult PT Treatment:                                                DATE: 05/22/23 Therapeutic Exercise: Nustep 5 mins L6 UE/LE LAQ 2x10 5# each Lateral step ups 6' 2x10 eack STS 2x10 c 5lb above shoulder press Ankle DF/PF 2x10 Therapeutic Activity: Heel to toe walking  [facility] Adult PT Treatment:                                                DATE: 05/17/23 Therapeutic Exercise: Nustep 8 mins L5 UE/LE Lateral step ups 6' 2x10 eack STS 2x10 c 5lb above shoulder press Shoulder abd star pattern GTB 2x10 Shoulder ER pattern GTB 2x10 Ankle DF/PF 2x10 Therapeutic Activity: BERG BALANCE= Total Score: 54/56     PATIENT EDUCATION:  Education details: HEP programming for DC  Person educated: Patient Education method: Explanation, Demonstration, Tactile cues, Verbal cues, and Handouts Education comprehension: verbalized understanding, returned demonstration, verbal cues required, and tactile cues required   HOME EXERCISE PROGRAM:  Access Code: [facility] URL: [email or web address] Date: 05/29/2023 Prepared by: Joellyn Rued  Exercises - Standing Hip Abduction with Counter Support  - 1 x daily - 7 x weekly - 2 sets - 10 reps - 2 hold - Standing Single Leg Stance with Counter Support  - 1 x daily - 7 x weekly - 1 sets - 3 reps - 20 hold - Sit to Stand Without Arm Support  - 1 x daily - 7 x weekly - 2 sets - 10 reps - 2 hold - Heel Toe Raises with Counter Support  - 1 x  daily - 7 x weekly - 2 sets - 10 reps - 23 hold - Hooklying Clamshell with Resistance  - 1 x daily - 7 x weekly - 2 sets - 10 reps - 2 hold - Supine Bridge  - 1 x daily - 7 x weekly - 2 sets - 10 reps - 2 hold - Shoulder  External Rotation and Scapular Retraction with Resistance  - 1 x daily - 7 x weekly - 2 sets - 10 reps - 3 hold - Standing Shoulder Horizontal Abduction with Resistance  - 1 x daily - 7 x weekly - 2 sets - 10 reps - 3 hold - Modified Thomas Stretch  - 1 x daily - 7 x weekly - 1 sets - 5 reps - 10 hold - Long Sitting Calf Stretch with Strap  - 1 x daily - 7 x weekly - 1 sets - 5-3 reps - 20 hold  ASSESSMENT:   CLINICAL IMPRESSION:  PT was completed for LE and postural strengthening and balance therex. Patient has met her goals for PT and has made remarkable progress. She understands her routine and will continue to perform at home as well as pool classes.   OBJECTIVE IMPAIRMENTS: decreased activity tolerance, decreased balance, difficulty walking, decreased strength, postural dysfunction, obesity, and pain.    ACTIVITY LIMITATIONS: carrying, lifting, bending, standing, squatting, and locomotion level   PARTICIPATION LIMITATIONS: meal prep, cleaning, laundry, shopping, community activity, and yard work   PERSONAL FACTORS: Age, Fitness, Past/current experiences, Time since onset of injury/illness/exacerbation, and 1-2 comorbidities: High BMI, R hip fx- IM nail repair 06/08/22  are also affecting patient's functional outcome.    REHAB POTENTIAL: Good   CLINICAL DECISION MAKING: Evolving/moderate complexity   EVALUATION COMPLEXITY: Moderate     GOALS:   SHORT TERM GOALS: Target date: 04/27/23 Pt will be Ind in an initial HEP  Baseline: started Goal status: MET   LONG TERM GOALS: Target date: 06/01/23   Pt will be Ind in a final HEP to maintain achieved LOF  Baseline: started Goal status: MET   2.  Improve by MCID of 53ft as indication of improved functional mobility  Baseline: 353ft Status: 435ft;05/22/23=410ft Goal status: MET    3.  Increased bilat hip ext strength to 3+ and abd, ER to 4+ for improved balance and functional mobility Baseline: see flow sheets 05/11/23: see flow  sheets Goal status: MET    4.  Pt's Berg balance test will increase by 5 points as indication of improved balance Baseline: 46/56 05/17/23: 54/56 Goal status: MET   5.  Pt's FOTO score will improved to the predicted value of 54% as indication of improved function  Baseline: 48% Goal status: MET 05/15/23 PLAN:   PT FREQUENCY: 2x/week   PT DURATION: 8 weeks   PLANNED INTERVENTIONS: Therapeutic exercises, Therapeutic activity, Neuromuscular re-education, Balance training, Gait training, Patient/Family education, Self Care, Joint mobilization, Aquatic Therapy, Dry Needling, Cognitive remediation, Cryotherapy, Moist heat, Taping, Ultrasound, Ionotophoresis 4mg /ml Dexamethasone, Manual therapy, and Re-evaluation   PLAN FOR NEXT SESSION: NA, DC        Karie Mainland, PT 05/31/23 11:36 AM Phone: [phone] Fax: [phone]     PHYSICAL THERAPY DISCHARGE SUMMARY  Visits from Start of Care: 15  Current functional level related to goals / functional outcomes: See above   Remaining deficits: Posture, high level balance    Education / Equipment: Body mechanics, posture, balance, safety, osteoporosis, HEP    Patient agrees to discharge. Patient goals were met.  Patient is being discharged due to being pleased with the current functional level.  Karie Mainland, PT 05/31/23 11:49 AM Phone: [phone] Fax: [phone]

## 2023-05-31 ENCOUNTER — Ambulatory Visit: Payer: Medicare HMO | Admitting: Physical Therapy

## 2023-05-31 ENCOUNTER — Encounter: Payer: Self-pay | Admitting: Physical Therapy

## 2023-05-31 DIAGNOSIS — R262 Difficulty in walking, not elsewhere classified: Secondary | ICD-10-CM | POA: Diagnosis not present

## 2023-05-31 DIAGNOSIS — M6281 Muscle weakness (generalized): Secondary | ICD-10-CM | POA: Diagnosis not present

## 2023-05-31 DIAGNOSIS — R208 Other disturbances of skin sensation: Secondary | ICD-10-CM | POA: Diagnosis not present

## 2023-07-10 ENCOUNTER — Encounter: Payer: Self-pay | Admitting: Internal Medicine

## 2023-07-10 ENCOUNTER — Ambulatory Visit: Payer: Medicare HMO | Attending: Internal Medicine | Admitting: Internal Medicine

## 2023-07-10 ENCOUNTER — Telehealth: Payer: Self-pay

## 2023-07-10 VITALS — BP 140/84 | HR 73 | Temp 98.4°F | Ht 63.0 in | Wt 158.0 lb

## 2023-07-10 DIAGNOSIS — E039 Hypothyroidism, unspecified: Secondary | ICD-10-CM | POA: Diagnosis not present

## 2023-07-10 DIAGNOSIS — R030 Elevated blood-pressure reading, without diagnosis of hypertension: Secondary | ICD-10-CM

## 2023-07-10 DIAGNOSIS — Z23 Encounter for immunization: Secondary | ICD-10-CM | POA: Diagnosis not present

## 2023-07-10 DIAGNOSIS — M810 Age-related osteoporosis without current pathological fracture: Secondary | ICD-10-CM

## 2023-07-10 NOTE — Patient Instructions (Signed)
You should be on the Fosamax also known as alendronate 70 mg once a week.  Please check your bottle when you return home.  Please call us back with the dose of the calcium plus vitamin D supplement that you are taking.

## 2023-07-10 NOTE — Telephone Encounter (Signed)
Copied from CRM [id number]. Topic: General - Inquiry >> Jul 10, 2023  2:24 PM Marlow Baars wrote: Reason for CRM: The patient called in to inform her provider of the dosage of the Vitamin D3 she is currently taking. She is currently taking 125 mg of the D3 by itself and with the combined calcium 25 mcg. The calcium is 1200mg . Please assist patient further if necessary.

## 2023-07-10 NOTE — Progress Notes (Signed)
Patient ID: Krystal Moore, female    DOB: 1929/10/31  MRN: [id number]  CC: Hypothyroidism (Hypothyroidism f/u. Nicki Reaper taking mushroom coffee to help with constipation/Yes to flu vax)   Subjective: Krystal Moore is a 89 y.o. female who presents for chronic ds management. Her concerns today include:  Hx of osteoporosis on Fosamax, hypothyroid, obesity, Vit B12 def, RT CTS, OA cervical spine, lumbar spinal stenosis,   Blood pressure was elevated on last visit and still elevated today. 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 Does not use salt in her foods.  No device to check BP   Patient had bone density study done.  That showed that she has progressed from osteopenia to osteoporosis at the hips.  I recommended increasing the Fosamax from 35 mg once a week to 70 mg once a week. She is not sure if she got the 70 mg or not She is taking calcium and vitamin D supplement combined into 1 pill plus some additional Vit D both once a day.  Does not recall the dose; both OTC Referred to PT on last visit for gait training which she has completed.  Walks 2 blocks 3x/day now.  Worked with  therapist to build her leg muscles back up. Reports her balance is better. Not having to use a cane.  No falls  Hypothyroid: Taking levothyroxine as prescribed.  Last TSH was normal at 2.99.  HM: Due for flu shot and COVID-19 vaccine booster.. Patient Active Problem List   Diagnosis Date Noted   CTS (carpal tunnel syndrome) 08/16/2022   Closed right hip fracture (HCC) 06/07/2022   Fall at home, initial encounter 06/07/2022   Numbness 07/30/2019   Spinal stenosis of lumbar region 07/30/2019   Osteopenia after menopause 05/29/2019   Over weight 05/29/2019   Elevated blood pressure reading 05/29/2019   Immunization due 08/03/2017   Left leg swelling 05/04/2017   Allergic rhinitis 10/11/2015    Hypothyroidism 12/10/2013   Peripheral neuropathy 05/16/2013     Current Outpatient Medications on File Prior to Visit  Medication Sig Dispense Refill   alendronate (FOSAMAX) 70 MG tablet Take 1 tablet (70 mg total) by mouth every 7 (seven) days. Take with a full glass of water on an empty stomach. Sit upright for 30 minutes after taking. 4 tablet 11   calcium carbonate (OS-CAL - DOSED IN MG OF ELEMENTAL CALCIUM) 1250 (500 Ca) MG tablet Take 1 tablet by mouth daily.     Cholecalciferol (VITAMIN D3) 125 MCG (5000 UT) CAPS Take 1 capsule by mouth daily.     cyanocobalamin (VITAMIN B12) 1000 MCG tablet Take 3,000 mcg by mouth daily.     furosemide (LASIX) 20 MG tablet TAKE 1 TABLET BY MOUTH EVERY DAY FOR 5 DAYS THEN 1 EVERY DAY AS NEEDED FOR SWELLING 10 tablet 0   gabapentin (NEURONTIN) 100 MG capsule Take 1 capsule (100 mg total) by mouth at bedtime. 30 capsule 3   Krill Oil 1000 MG CAPS Take 1,000 mg by mouth daily.     levothyroxine (SYNTHROID) 112 MCG tablet TAKE 1 TABLET BY MOUTH EVERY DAY BEFORE BREAKFAST 90 tablet 1   tiZANidine (ZANAFLEX) 4 MG tablet Take 1 tablet (4 mg total) by mouth 2 (two) times daily as needed for muscle spasms. 30 tablet 1   No current facility-administered medications on file prior to visit.    Allergies  Allergen Reactions  Elemental Sulfur Shortness Of Breath and Swelling   Milk-Related Compounds Nausea And Vomiting   Poison Ivy Extract [Poison Ivy Extract] Itching, Rash and Other (See Comments)    Muscle damage    Tobacco [Tobacco] Anaphylaxis and Swelling    Allergic to cigarette smoke   Cymbalta [Duloxetine Hcl] Diarrhea   Orange Juice [Orange Oil] Nausea And Vomiting    Social History   Socioeconomic History   Marital status: Widowed    Spouse name: Not on file   Number of children: 0   Years of education: college   Highest education level: Master's degree (e.g., MA, MS, MEng, MEd, MSW, MBA)  Occupational History   Not on file  Tobacco Use    Smoking status: Never   Smokeless tobacco: Never  Substance and Sexual Activity   Alcohol use: Yes    Comment: 1/4 glass per day   Drug use: No   Sexual activity: Not Currently  Other Topics Concern   Not on file  Social History Narrative   ** Merged History Encounter **       07/30/19 Patient lives at home and she has a roommate.  Has a poodle.  Patient works part time Programmer, systems. Patient has a college education. Both handed. Plays organ since age 69.  Caffeine- None    Social Determinants of Health   Financial Resource Strain: Low Risk  (07/09/2023)   Overall Financial Resource Strain (CARDIA)    Difficulty of Paying Living Expenses: Not very hard  Food Insecurity: No Food Insecurity (07/09/2023)   Hunger Vital Sign    Worried About Running Out of Food in the Last Year: Never true    Ran Out of Food in the Last Year: Never true  Transportation Needs: Unmet Transportation Needs (07/09/2023)   PRAPARE - Transportation    Lack of Transportation (Medical): Yes    Lack of Transportation (Non-Medical): Yes  Physical Activity: Sufficiently Active (07/09/2023)   Exercise Vital Sign    Days of Exercise per Week: 7 days    Minutes of Exercise per Session: 60 min  Stress: No Stress Concern Present (07/09/2023)   [employer] of Occupational Health - Occupational Stress Questionnaire    Feeling of Stress : Only a little  Social Connections: Moderately Integrated (07/09/2023)   Social Connection and Isolation Panel [NHANES]    Frequency of Communication with Friends and Family: More than three times a week    Frequency of Social Gatherings with Friends and Family: More than three times a week    Attends Religious Services: More than 4 times per year    Active Member of [employer] or Organizations: Yes    Attends Banker Meetings: More than 4 times per year    Marital Status: Widowed  Intimate Partner Violence: Not on file    Family History  Problem Relation  Age of Onset   Stroke Mother        questionable   Clotting disorder Mother    Heart attack Father     Past Surgical History:  Procedure Laterality Date   CATARACT EXTRACTION Left 06/2019   INTRAMEDULLARY (IM) NAIL INTERTROCHANTERIC Right 06/08/2022   Procedure: INTRAMEDULLARY (IM) NAIL INTERTROCHANTERIC;  Surgeon: Samson Frederic, MD;  Location: [facility];  Service: Orthopedics;  Laterality: Right;   MOUTH SURGERY     NASAL SINUS SURGERY      ROS: Review of Systems Negative except as stated above  PHYSICAL EXAM: BP (!) 140/84   Pulse 73  Temp 98.4 F (36.9 C) (Oral)   Ht 5\' 3"  (1.6 m)   Wt 158 lb (71.7 kg)   SpO2 96%   BMI 27.99 kg/m   Physical Exam  General appearance - alert, well appearing, pleasant elderly Caucasian female and in no distress Mental status - normal mood, behavior, speech, dress, motor activity, and thought processes Neck - supple, no significant adenopathy Chest - clear to auscultation, no wheezes, rales or rhonchi, symmetric air entry Heart - normal rate, regular rhythm, normal S1, S2, no murmurs, rubs, clicks or gallops Extremities - peripheral pulses normal, no pedal edema, no clubbing or cyanosis      Latest Ref Rng & Units 03/22/2023   11:56 AM 06/11/2022    6:29 AM 06/10/2022    3:53 AM  CMP  Glucose 70 - 99 mg/dL 78  98  130   BUN 10 - 36 mg/dL 16  8  12    Creatinine 0.57 - 1.00 mg/dL 8.65  7.84  6.96   Sodium 134 - 144 mmol/L 139  137  134   Potassium 3.5 - 5.2 mmol/L 4.4  4.1  4.0   Chloride 96 - 106 mmol/L 102  107  103   CO2 20 - 29 mmol/L 21  26  25    Calcium 8.7 - 10.3 mg/dL 29.5  9.1  9.0   Total Protein 6.0 - 8.5 g/dL 6.8     Total Bilirubin 0.0 - 1.2 mg/dL 0.4     Alkaline Phos 44 - 121 IU/L 72     AST 0 - 40 IU/L 19     ALT 0 - 32 IU/L 11      Lipid Panel     Component Value Date/Time   CHOL 233 (H) 05/29/2019 1127   TRIG 55 05/29/2019 1127   HDL 90 05/29/2019 1127   CHOLHDL 2.6 05/29/2019 1127   CHOLHDL 3.1 06/08/2016  1155   VLDL 20 06/08/2016 1155   LDLCALC 132 (H) 05/29/2019 1127    CBC    Component Value Date/Time   WBC 5.3 03/22/2023 1156   WBC 5.4 06/11/2022 0629   RBC 4.50 03/22/2023 1156   RBC 3.66 (L) 06/11/2022 0629   HGB 13.8 03/22/2023 1156   HGB 13.5 06/07/2015 1240   HCT 40.6 03/22/2023 1156   HCT 41.0 06/07/2015 1240   PLT 220 03/22/2023 1156   MCV 90 03/22/2023 1156   MCV 91.9 06/07/2015 1240   MCH 30.7 03/22/2023 1156   MCH 31.4 06/11/2022 0629   MCHC 34.0 03/22/2023 1156   MCHC 33.1 06/11/2022 0629   RDW 12.8 03/22/2023 1156   RDW 13.6 06/07/2015 1240   LYMPHSABS 1.6 06/07/2022 1149   LYMPHSABS 1.5 06/07/2015 1240   MONOABS 0.3 06/07/2022 1149   MONOABS 0.4 06/07/2015 1240   EOSABS 0.2 06/07/2022 1149   EOSABS 0.2 06/07/2015 1240   BASOSABS 0.0 06/07/2022 1149   BASOSABS 0.1 06/07/2015 1240    ASSESSMENT AND PLAN: 1. Hypothyroidism, unspecified type Continue levothyroxine 112 mcg daily.  2. Elevated blood pressure reading Discussed starting her on medication today versus bring her back in 1 month for recheck.  Patient prefers the latter.  3. Age-related osteoporosis without current pathological fracture I will do on her discharge summary the dose of the Fosamax that she should be on which is a 70 mg.  I also request that she call us back to let us know the dose of the combined calcium plus vitamin D tablet and the additional vitamin  D tablet that she is taking.  I want to make sure that she is not on too higher dose.  We will also check vitamin D level today - VITAMIN D 25 Hydroxy (Vit-D Deficiency, Fractures)  4. Need for COVID-19 vaccine Encouraged her to get a COVID-19 booster shot which she can get at any outside pharmacy  5. Encounter for immunization - Flu Vaccine Trivalent High Dose (Fluad)    Patient was given the opportunity to ask questions.  Patient verbalized understanding of the plan and was able to repeat key elements of the plan.   This  documentation was completed using Paediatric nurse.  Any transcriptional errors are unintentional.  Orders Placed This Encounter  Procedures   Flu Vaccine Trivalent High Dose (Fluad)   VITAMIN D 25 Hydroxy (Vit-D Deficiency, Fractures)     Requested Prescriptions    No prescriptions requested or ordered in this encounter    Return in about 1 month (around 08/09/2023) for BP check.  Jonah Blue, MD, FACP

## 2023-08-13 ENCOUNTER — Ambulatory Visit: Payer: Medicare HMO | Admitting: Internal Medicine

## 2023-08-31 ENCOUNTER — Ambulatory Visit: Payer: Self-pay | Admitting: *Deleted

## 2023-08-31 ENCOUNTER — Other Ambulatory Visit: Payer: Self-pay | Admitting: Internal Medicine

## 2023-08-31 DIAGNOSIS — M7989 Other specified soft tissue disorders: Secondary | ICD-10-CM

## 2023-08-31 MED ORDER — FUROSEMIDE 20 MG PO TABS
ORAL_TABLET | ORAL | 0 refills | Status: DC
Start: 2023-08-31 — End: 2023-10-18

## 2023-08-31 NOTE — Telephone Encounter (Signed)
Message from [place] A sent at 08/31/2023 12:25 PM EST  Summary: swelling in both legs   Pt states that she has swelling in both of her legs since yesterday. Pt states that she has been walking her pet around the block every hour and had been drinking more water. Pt states that she had two furosemide pills (for swelling) left and she took one yesterday and one this morning. Pt is wanting to know if some more of that could be refilled and if there is anything else she can do or take.   [id number] [place], [place] - [address] AT [facility] OF [address] Phone: [phone] Fax: [phone]          Call History  Contact Date/Time Type Contact Phone/Fax User  08/31/2023 12:13 PM EST Phone (Incoming) Moore, Krystal (Self) [phone] Judie Petit) Allred, Jasmine Awe   Reason for Disposition  [1] Localized swelling (e.g., small area of puffy or swollen skin) AND [2] itchy    Having swelling up to knees.  After taking 2 doses of Lasix 20 mg the swelling has gone down.    She has been walking her dog around the block and it's been very warm and humid outside.  Given ED precautions.  Requesting a refill of her Lasix 20 mg that's for as needed use.  Answer Assessment - Initial Assessment Questions 1. ONSET: "When did the swelling start?" (e.g., minutes, hours, days)     I'm having swelling in both of legs all of a sudden.  Ankles yesterday but to day just below my knees both legs.   My ankles are less swollen now.   I took a Lasix yesterday and again this morning.    2. LOCATION: "What part of the leg is swollen?"  "Are both legs swollen or just one leg?"     See above  3. SEVERITY: "How bad is the swelling?" (e.g., localized; mild, moderate, severe)   - Localized: Small area of swelling localized to one leg.   - MILD pedal edema: Swelling limited to foot and ankle, pitting edema < 1/4 inch (6 mm) deep, rest and elevation eliminate most or all swelling.    - MODERATE edema: Swelling of lower leg to knee, pitting edema > 1/4 inch (6 mm) deep, rest and elevation only partially reduce swelling.   - SEVERE edema: Swelling extends above knee, facial or hand swelling present.      Moderate No shortness of breath or chest discomfort. 4. REDNESS: "Does the swelling look red or infected?"     No    They feel tight.   No pain or redness in either leg.   I move every hour and walk since having my hip surgery.   I walk my puppy every day. I'm having to watch my breathing since having my hip surgery.   I've never had to do that before.   Each week I can walk a little further.   My breathing tells me when I have to turn around and come home.  I've been getting better with my distance.   5. PAIN: "Is the swelling painful to touch?" If Yes, ask: "How painful is it?"   (Scale 1-10; mild, moderate or severe)     I'm not in pain. 6. FEVER: "Do you have a fever?" If Yes, ask: "What is it, how was it measured, and when did it start?"      Not asked 7. CAUSE: "What  do you think is causing the leg swelling?"     I don't know. I have the original bottle of Lasix July 2023 that was for my hip recovery.   Take one tablet for 5 days  and then one every day as needed for swelling. 8. MEDICAL HISTORY: "Do you have a history of blood clots (e.g., DVT), cancer, heart failure, kidney disease, or liver failure?"     No blood clots.     I'm a free bleeder.   9. RECURRENT SYMPTOM: "Have you had leg swelling before?" If Yes, ask: "When was the last time?" "What happened that time?"     Yes after hip surgery.   That's why I had the Lasix left over but I took my last pill this morning.   It says to take it as needed for the swelling in my legs.   10. OTHER SYMPTOMS: "Do you have any other symptoms?" (e.g., chest pain, difficulty breathing)       Denies chest discomfort or shortness of breath 11. PREGNANCY: "Is there any chance you are pregnant?" "When was your last menstrual  period?"       N/A due to age  Protocols used: Leg Swelling and Edema-A-AH

## 2023-08-31 NOTE — Telephone Encounter (Signed)
Call was placed to patient this evening.  Patient reports that the swelling in her legs are getting better.  She took the last time that she had left of furosemide that I had given her in the past to use as needed.  Requesting refill.  Refill sent.  Advised patient to follow-up with me if it does not continue to improve.  She expressed understanding

## 2023-08-31 NOTE — Progress Notes (Signed)
Called pt and informed RF sent on Furosemide.

## 2023-08-31 NOTE — Telephone Encounter (Signed)
Spoke with patient . Patient denies SOB but has notices swelling in both legs. Asking patient to push down with one finger on both legs where there is swelling and tell me if when she release if there is an indentation . Patient voiced that there is noticeable indentation on both legs. Advised patient to keep legs elevated and to seek medication at the ED or UC if S/S  worsen. Routing to PCP . Please advise.

## 2023-08-31 NOTE — Telephone Encounter (Signed)
  Chief Complaint: Having swelling up to knees on both legs that started yesterday.    Needs a refill of her Lasix 20 mg she has as needed for legs swelling.   Symptoms: Swelling of both legs Frequency: Started yesterday Pertinent Negatives: Patient denies pain, shortness of breath, or chest discomfort.   No redness or wounds on legs.   Disposition: [] ED /[] Urgent Care (no appt availability in office) / [] Appointment(In office/virtual)/ []  [facility]/ [] Home Care/ [] Refused Recommended Disposition /[] [facility]/ [x]  Follow-up with PCP Additional Notes: Message sent to Dr. Laural Benes requesting a refill of the Lasix 20 mg.  If agreeable send to [facility] on [address]

## 2023-10-16 ENCOUNTER — Other Ambulatory Visit: Payer: Self-pay | Admitting: Internal Medicine

## 2023-10-16 DIAGNOSIS — M7989 Other specified soft tissue disorders: Secondary | ICD-10-CM

## 2023-10-18 NOTE — Telephone Encounter (Signed)
Requested medication (s) are due for refill today:   Yes  Requested medication (s) are on the active medication list:   Yes  Future visit scheduled:   Yes tomorrow 12/27 with Dr. Laural Benes   Last ordered: 08/31/2023 #20, 0 refills  Unable to refill because labs are due per protocol.      Requested Prescriptions  Pending Prescriptions Disp Refills   furosemide (LASIX) 20 MG tablet [Pharmacy Med Name: FUROSEMIDE 20MG  TABLETS] 20 tablet 0    Sig: TAKE 1 TABLET BY MOUTH EVERY DAY FOR 5 DAYS THEN 1 EVERY DAY AS NEEDED FOR SWELLING     Cardiovascular:  Diuretics - Loop Failed - 10/18/2023 10:45 AM      Failed - K in normal range and within 180 days    Potassium  Date Value Ref Range Status  03/22/2023 4.4 3.5 - 5.2 mmol/L Final         Failed - Ca in normal range and within 180 days    Calcium  Date Value Ref Range Status  03/22/2023 10.0 8.7 - 10.3 mg/dL Final         Failed - Na in normal range and within 180 days    Sodium  Date Value Ref Range Status  03/22/2023 139 134 - 144 mmol/L Final         Failed - Cr in normal range and within 180 days    Creatinine, Ser  Date Value Ref Range Status  03/22/2023 0.89 0.57 - 1.00 mg/dL Final         Failed - Cl in normal range and within 180 days    Chloride  Date Value Ref Range Status  03/22/2023 102 96 - 106 mmol/L Final         Failed - Mg Level in normal range and within 180 days    No results found for: "MG"       Failed - Last BP in normal range    BP Readings from Last 1 Encounters:  07/10/23 (!) 140/84         Passed - Valid encounter within last 6 months    Recent Outpatient Visits           3 months ago Hypothyroidism, unspecified type   [facility] Of [place]. [facility] Marcine Matar, MD   7 months ago Left lumbar radiculopathy   [facility] - [facility] Dept Of [place]. [facility] Marcine Matar, MD   11 months ago Osteopenia after  menopause   [facility] Dept Of [place]. [facility] Marcine Matar, MD   1 year ago Closed fracture of right hip, sequela   [facility] - A Dept Of [place]. [facility] Marcine Matar, MD   1 year ago Hypothyroidism, unspecified type   [facility] - A Dept Of [place]. [facility] [place], Marzella Schlein, [place]       Future Appointments             Tomorrow Marcine Matar, MD [facility] - A Dept Of [place]a Brideg[place]. [facility]

## 2023-10-19 ENCOUNTER — Encounter: Payer: Self-pay | Admitting: Internal Medicine

## 2023-10-19 ENCOUNTER — Ambulatory Visit: Payer: Medicare HMO | Attending: Internal Medicine | Admitting: Internal Medicine

## 2023-10-19 VITALS — BP 124/79 | HR 69 | Temp 98.0°F | Ht 63.0 in | Wt 159.0 lb

## 2023-10-19 DIAGNOSIS — Z9181 History of falling: Secondary | ICD-10-CM

## 2023-10-19 DIAGNOSIS — Z5982 Transportation insecurity: Secondary | ICD-10-CM

## 2023-10-19 DIAGNOSIS — E538 Deficiency of other specified B group vitamins: Secondary | ICD-10-CM | POA: Diagnosis not present

## 2023-10-19 DIAGNOSIS — M25473 Effusion, unspecified ankle: Secondary | ICD-10-CM

## 2023-10-19 DIAGNOSIS — M810 Age-related osteoporosis without current pathological fracture: Secondary | ICD-10-CM | POA: Diagnosis not present

## 2023-10-19 DIAGNOSIS — E039 Hypothyroidism, unspecified: Secondary | ICD-10-CM | POA: Diagnosis not present

## 2023-10-19 MED ORDER — FUROSEMIDE 20 MG PO TABS: ORAL_TABLET | ORAL | 0 refills | Status: DC

## 2023-10-19 NOTE — Progress Notes (Signed)
Patient ID: Krystal Moore, female    DOB: 15-Jan-1930  MRN: [id number]  CC: Hypothyroidism (Hypothyroidism f/u. Med refill. Franchot Erichsen RSV - pt watched video /Already received flu vax)   Subjective: Krystal Moore is a 89 y.o. female who presents for chronic ds management.  She is by herself. Her concerns today include:  Hx of osteoporosis hips on Fosamax, hypothyroid, obesity, Vit B12 def, RT CTS, OA cervical spine, lumbar spinal stenosis,   HM:  does not want RSV vaccine  Elev BP:  BP elev on last 2 visits. No device to check BP She does not add salt to foods Still walks 30  mins three times a day with her dog. Used Furosemide 2x in past 2 wks for swelling in ankles.  Osteoporosis:  taking and tolerating Fosamax once a wk.  Taking VitD/Ca supplements daily. Larey Seat forward on her sofa about a wk ago ago while leaning over it to put candles in the windows. No injuries. Ambulates unassisted. Now has a friend who is staying with her and they both help each other out.  Still drives but only when she needs to. Other wise independant in ADLs. Eating plenty fruits and veggies  Vit B12 def:  taking B12 supplement  Thyroid:  Taking Levothyroxine consistently  Patient Active Problem List   Diagnosis Date Noted   Age-related osteoporosis without current pathological fracture 07/10/2023   CTS (carpal tunnel syndrome) 08/16/2022   Closed right hip fracture (HCC) 06/07/2022   Fall at home, initial encounter 06/07/2022   Numbness 07/30/2019   Spinal stenosis of lumbar region 07/30/2019   Osteopenia after menopause 05/29/2019   Over weight 05/29/2019   Elevated blood pressure reading 05/29/2019   Immunization due 08/03/2017   Left leg swelling 05/04/2017   Allergic rhinitis 10/11/2015   Hypothyroidism 12/10/2013   Peripheral neuropathy 05/16/2013     Current Outpatient Medications on File Prior to Visit  Medication Sig Dispense Refill   alendronate (FOSAMAX) 70 MG  tablet Take 1 tablet (70 mg total) by mouth every 7 (seven) days. Take with a full glass of water on an empty stomach. Sit upright for 30 minutes after taking. 4 tablet 11   calcium carbonate (OS-CAL - DOSED IN MG OF ELEMENTAL CALCIUM) 1250 (500 Ca) MG tablet Take 1 tablet by mouth daily.     Cholecalciferol (VITAMIN D3) 125 MCG (5000 UT) CAPS Take 1 capsule by mouth daily.     cyanocobalamin (VITAMIN B12) 1000 MCG tablet Take 3,000 mcg by mouth daily.     gabapentin (NEURONTIN) 100 MG capsule Take 1 capsule (100 mg total) by mouth at bedtime. 30 capsule 3   Krill Oil 1000 MG CAPS Take 1,000 mg by mouth daily.     levothyroxine (SYNTHROID) 112 MCG tablet TAKE 1 TABLET BY MOUTH EVERY DAY BEFORE BREAKFAST 90 tablet 1   tiZANidine (ZANAFLEX) 4 MG tablet Take 1 tablet (4 mg total) by mouth 2 (two) times daily as needed for muscle spasms. 30 tablet 1   No current facility-administered medications on file prior to visit.    Allergies  Allergen Reactions   Elemental Sulfur Shortness Of Breath and Swelling   Milk-Related Compounds Nausea And Vomiting   Poison Ivy Extract [Poison Ivy Extract] Itching, Rash and Other (See Comments)    Muscle damage    Tobacco [Tobacco] Anaphylaxis and Swelling    Allergic to cigarette smoke   Cymbalta [Duloxetine Hcl] Diarrhea   Orange Juice [Orange Oil] Nausea And Vomiting  Social History   Socioeconomic History   Marital status: Widowed    Spouse name: Not on file   Number of children: 0   Years of education: college   Highest education level: Master's degree (e.g., MA, MS, MEng, MEd, MSW, MBA)  Occupational History   Not on file  Tobacco Use   Smoking status: Never   Smokeless tobacco: Never  Substance and Sexual Activity   Alcohol use: Yes    Comment: 1/4 glass per day   Drug use: No   Sexual activity: Not Currently  Other Topics Concern   Not on file  Social History Narrative   ** Merged History Encounter **       07/30/19 Patient lives at  home and she has a roommate.  Has a poodle.  Patient works part time Programmer, systems. Patient has a college education. Both handed. Plays organ since age 31.  Caffeine- None    Social Drivers of Health   Financial Resource Strain: Low Risk  (07/09/2023)   Overall Financial Resource Strain (CARDIA)    Difficulty of Paying Living Expenses: Not very hard  Food Insecurity: No Food Insecurity (07/09/2023)   Hunger Vital Sign    Worried About Running Out of Food in the Last Year: Never true    Ran Out of Food in the Last Year: Never true  Transportation Needs: Unmet Transportation Needs (07/09/2023)   PRAPARE - Transportation    Lack of Transportation (Medical): Yes    Lack of Transportation (Non-Medical): Yes  Physical Activity: Sufficiently Active (07/09/2023)   Exercise Vital Sign    Days of Exercise per Week: 7 days    Minutes of Exercise per Session: 60 min  Stress: No Stress Concern Present (07/09/2023)   [employer] of Occupational Health - Occupational Stress Questionnaire    Feeling of Stress : Only a little  Social Connections: Moderately Integrated (07/09/2023)   Social Connection and Isolation Panel [NHANES]    Frequency of Communication with Friends and Family: More than three times a week    Frequency of Social Gatherings with Friends and Family: More than three times a week    Attends Religious Services: More than 4 times per year    Active Member of [employer] or Organizations: Yes    Attends Banker Meetings: More than 4 times per year    Marital Status: Widowed  Intimate Partner Violence: Not on file    Family History  Problem Relation Age of Onset   Stroke Mother        questionable   Clotting disorder Mother    Heart attack Father     Past Surgical History:  Procedure Laterality Date   CATARACT EXTRACTION Left 06/2019   INTRAMEDULLARY (IM) NAIL INTERTROCHANTERIC Right 06/08/2022   Procedure: INTRAMEDULLARY (IM) NAIL INTERTROCHANTERIC;   Surgeon: Samson Frederic, MD;  Location: [facility];  Service: Orthopedics;  Laterality: Right;   MOUTH SURGERY     NASAL SINUS SURGERY      ROS: Review of Systems Negative except as stated above  PHYSICAL EXAM: BP 124/79   Pulse 69   Temp 98 F (36.7 C) (Oral)   Ht 5\' 3"  (1.6 m)   Wt 159 lb (72.1 kg)   SpO2 97%   BMI 28.17 kg/m   Physical Exam  General appearance - alert, well appearing, elderly Caucasian female and in no distress.  She ambulates unassisted. Mental status - normal mood, behavior, speech, dress, motor activity, and thought processes Neck -  supple, no significant adenopathy Chest - clear to auscultation, no wheezes, rales or rhonchi, symmetric air entry Heart - normal rate, regular rhythm, normal S1, S2, no murmurs, rubs, clicks or gallops Extremities - trace ankle edema LT>RT      Latest Ref Rng & Units 03/22/2023   11:56 AM 06/11/2022    6:29 AM 06/10/2022    3:53 AM  CMP  Glucose 70 - 99 mg/dL 78  98  191   BUN 10 - 36 mg/dL 16  8  12    Creatinine 0.57 - 1.00 mg/dL 4.78  2.95  6.21   Sodium 134 - 144 mmol/L 139  137  134   Potassium 3.5 - 5.2 mmol/L 4.4  4.1  4.0   Chloride 96 - 106 mmol/L 102  107  103   CO2 20 - 29 mmol/L 21  26  25    Calcium 8.7 - 10.3 mg/dL 30.8  9.1  9.0   Total Protein 6.0 - 8.5 g/dL 6.8     Total Bilirubin 0.0 - 1.2 mg/dL 0.4     Alkaline Phos 44 - 121 IU/L 72     AST 0 - 40 IU/L 19     ALT 0 - 32 IU/L 11      Lipid Panel     Component Value Date/Time   CHOL 233 (H) 05/29/2019 1127   TRIG 55 05/29/2019 1127   HDL 90 05/29/2019 1127   CHOLHDL 2.6 05/29/2019 1127   CHOLHDL 3.1 06/08/2016 1155   VLDL 20 06/08/2016 1155   LDLCALC 132 (H) 05/29/2019 1127    CBC    Component Value Date/Time   WBC 5.3 03/22/2023 1156   WBC 5.4 06/11/2022 0629   RBC 4.50 03/22/2023 1156   RBC 3.66 (L) 06/11/2022 0629   HGB 13.8 03/22/2023 1156   HGB 13.5 06/07/2015 1240   HCT 40.6 03/22/2023 1156   HCT 41.0 06/07/2015 1240   PLT 220  03/22/2023 1156   MCV 90 03/22/2023 1156   MCV 91.9 06/07/2015 1240   MCH 30.7 03/22/2023 1156   MCH 31.4 06/11/2022 0629   MCHC 34.0 03/22/2023 1156   MCHC 33.1 06/11/2022 0629   RDW 12.8 03/22/2023 1156   RDW 13.6 06/07/2015 1240   LYMPHSABS 1.6 06/07/2022 1149   LYMPHSABS 1.5 06/07/2015 1240   MONOABS 0.3 06/07/2022 1149   MONOABS 0.4 06/07/2015 1240   EOSABS 0.2 06/07/2022 1149   EOSABS 0.2 06/07/2015 1240   BASOSABS 0.0 06/07/2022 1149   BASOSABS 0.1 06/07/2015 1240    ASSESSMENT AND PLAN:  1. Hypothyroidism, unspecified type (Primary) Continue levothyroxine as prescribed.  Due for TSH check today. - TSH  2. Age-related osteoporosis without current pathological fracture She is taking and tolerating Fosamax without any GI complaints.  She will continue the Fosamax once a week.  Continue calcium and vitamin D supplement as well.  3. History of recent fall Patient fell on her sofa while leaning forward over it to place him candles in the window.  Advised to be careful.  Avoid far reaching.  4. Ankle edema - furosemide (LASIX) 20 MG tablet; TAKE 1 TABLET BY MOUTH EVERY DAY FOR 5 DAYS THEN 1 EVERY DAY AS NEEDED FOR SWELLING  Dispense: 20 tablet; Refill: 0  5. Vitamin B 12 deficiency - Vitamin B12    Patient was given the opportunity to ask questions.  Patient verbalized understanding of the plan and was able to repeat key elements of the plan.   This documentation  was completed using Paediatric nurse.  Any transcriptional errors are unintentional.  Orders Placed This Encounter  Procedures   Vitamin B12   TSH     Requested Prescriptions   Signed Prescriptions Disp Refills   furosemide (LASIX) 20 MG tablet 20 tablet 0    Sig: TAKE 1 TABLET BY MOUTH EVERY DAY FOR 5 DAYS THEN 1 EVERY DAY AS NEEDED FOR SWELLING    Return in about 4 months (around 02/17/2024) for Give appt via phone Medicare Wellness Visit end of February with CMA.  Jonah Blue, MD, FACP

## 2023-10-20 LAB — VITAMIN B12: Vitamin B-12: >2000 pg/mL — ABNORMAL HIGH (ref 232–1245)

## 2023-10-20 LAB — TSH: TSH: 2.69 u[IU]/mL (ref 0.450–4.500)

## 2023-10-20 NOTE — Progress Notes (Signed)
Vitamin B 12 level is elevated.  Please inquire for me how much Vitamin B 12 supplement she is taking daily as we will have to reduce the dose. Thyroid level is normal.

## 2023-11-29 ENCOUNTER — Other Ambulatory Visit: Payer: Self-pay | Admitting: Internal Medicine

## 2023-11-29 DIAGNOSIS — E039 Hypothyroidism, unspecified: Secondary | ICD-10-CM

## 2023-12-18 ENCOUNTER — Ambulatory Visit: Payer: Medicare HMO | Attending: Family Medicine

## 2023-12-18 VITALS — Ht 63.0 in | Wt 154.0 lb

## 2023-12-18 DIAGNOSIS — Z0000 Encounter for general adult medical examination without abnormal findings: Secondary | ICD-10-CM

## 2023-12-18 NOTE — Patient Instructions (Addendum)
 Krystal Moore , Thank you for taking time to come for your Medicare Wellness Visit. I appreciate your ongoing commitment to your health goals. Please review the following plan we discussed and let me know if I can assist you in the future.   Referrals/Orders/Follow-Ups/Clinician Recommendations: Yes; Keep maintaining your health by keeping your appointments with Dr. Laural Benes and any specialists that you may see.  Call us if you need anything.  Have a great year!!!!  This is a list of the screening recommended for you and due dates:  Health Maintenance  Topic Date Due   COVID-19 Vaccine (4 - 2024-25 season) 06/24/2023   Medicare Annual Wellness Visit  12/17/2024   DTaP/Tdap/Td vaccine (3 - Td or Tdap) 11/29/2030   Pneumonia Vaccine  Completed   Flu Shot  Completed   DEXA scan (bone density measurement)  Completed   Zoster (Shingles) Vaccine  Completed   HPV Vaccine  Aged Out    Advanced directives: (Copy Requested) Please bring a copy of your health care power of attorney and living will to the office to be added to your chart at your convenience.  Next Medicare Annual Wellness Visit scheduled for next year: Yes

## 2023-12-18 NOTE — Progress Notes (Signed)
 Subjective:   Krystal Moore is a 89 y.o. who presents for a Medicare Wellness preventive visit.  Visit Complete: Virtual I connected with  Krystal Moore on 12/18/23 by a audio enabled telemedicine application and verified that I am speaking with the correct person using two identifiers.  Patient Location: Home  Provider Location: Office/Clinic  I discussed the limitations of evaluation and management by telemedicine. The patient expressed understanding and agreed to proceed.  Vital Signs: Because this visit was a virtual/telehealth visit, some criteria may be missing or patient reported. Any vitals not documented were not able to be obtained and vitals that have been documented are patient reported.  VideoDeclined- This patient declined Librarian, academic. Therefore the visit was completed with audio only.  AWV Questionnaire: Yes: Patient Medicare AWV questionnaire was completed by the patient on 12/17/2023; I have confirmed that all information answered by patient is correct and no changes since this date.  Cardiac Risk Factors include: advanced age (>53men, >21 women)     Objective:    Today's Vitals   12/18/23 0849  Weight: 154 lb (69.9 kg)  Height: 5\' 3"  (1.6 m)  PainSc: 0-No pain   Body mass index is 27.28 kg/m.     12/18/2023    8:53 AM 03/29/2023    6:23 AM 12/08/2022   10:33 AM 06/08/2022   10:38 AM 06/07/2022    2:20 PM 06/07/2022   11:45 AM 07/27/2021    6:59 PM  Advanced Directives  Does Patient Have a Medical Advance Directive? Yes No Yes Yes Yes No No  Type of Estate agent of [place];Living will   Living will Healthcare Power of [place];Living will    Does patient want to make changes to medical advance directive?    No - Patient declined No - Patient declined    Copy of Healthcare Power of Attorney in Chart? No - copy requested    No - copy requested    Would patient like information on  creating a medical advance directive?  No - Patient declined   No - Patient declined No - Patient declined No - Patient declined    Current Medications (verified) Outpatient Encounter Medications as of 12/18/2023  Medication Sig   alendronate (FOSAMAX) 70 MG tablet Take 1 tablet (70 mg total) by mouth every 7 (seven) days. Take with a full glass of water on an empty stomach. Sit upright for 30 minutes after taking.   calcium carbonate (OS-CAL - DOSED IN MG OF ELEMENTAL CALCIUM) 1250 (500 Ca) MG tablet Take 1 tablet by mouth daily.   Cholecalciferol (VITAMIN D3) 125 MCG (5000 UT) CAPS Take 1 capsule by mouth daily.   cyanocobalamin (VITAMIN B12) 1000 MCG tablet Take 2,500 mcg by mouth daily.   furosemide (LASIX) 20 MG tablet TAKE 1 TABLET BY MOUTH EVERY DAY FOR 5 DAYS THEN 1 EVERY DAY AS NEEDED FOR SWELLING   gabapentin (NEURONTIN) 100 MG capsule Take 1 capsule (100 mg total) by mouth at bedtime.   Krill Oil 1000 MG CAPS Take 1,000 mg by mouth daily.   levothyroxine (SYNTHROID) 112 MCG tablet TAKE 1 TABLET BY MOUTH EVERY DAY BEFORE BREAKFAST   tiZANidine (ZANAFLEX) 4 MG tablet Take 1 tablet (4 mg total) by mouth 2 (two) times daily as needed for muscle spasms.   No facility-administered encounter medications on file as of 12/18/2023.    Allergies (verified) Elemental sulfur, Milk-related compounds, Poison ivy extract [poison ivy extract], Tobacco [tobacco], Cymbalta [duloxetine  hcl], and Orange juice [orange oil]   History: Past Medical History:  Diagnosis Date   Anemia    Narcolepsy    Neuropathy    Obesity    Pneumonia    Polyneuropathy in other diseases classified elsewhere [facility])    Shoulder fracture, left    Past Surgical History:  Procedure Laterality Date   CATARACT EXTRACTION Left 06/2019   INTRAMEDULLARY (IM) NAIL INTERTROCHANTERIC Right 06/08/2022   Procedure: INTRAMEDULLARY (IM) NAIL INTERTROCHANTERIC;  Surgeon: Samson Frederic, MD;  Location: [facility];  Service: Orthopedics;   Laterality: Right;   MOUTH SURGERY     NASAL SINUS SURGERY     Family History  Problem Relation Age of Onset   Stroke Mother        questionable   Clotting disorder Mother    Heart attack Father    Social History   Socioeconomic History   Marital status: Widowed    Spouse name: Not on file   Number of children: 0   Years of education: college   Highest education level: Master's degree (e.g., MA, MS, MEng, MEd, MSW, MBA)  Occupational History   Not on file  Tobacco Use   Smoking status: Never   Smokeless tobacco: Never  Substance and Sexual Activity   Alcohol use: Yes    Comment: 1/4 glass per day   Drug use: No   Sexual activity: Not Currently  Other Topics Concern   Not on file  Social History Narrative   ** Merged History Encounter **       07/30/19 Patient lives at home and she has a roommate.  Has a poodle.  Patient works part time Programmer, systems. Patient has a college education. Both handed. Plays organ since age 55.  Caffeine- None    Social Drivers of Health   Financial Resource Strain: Low Risk  (12/18/2023)   Overall Financial Resource Strain (CARDIA)    Difficulty of Paying Living Expenses: Not very hard  Food Insecurity: No Food Insecurity (12/18/2023)   Hunger Vital Sign    Worried About Running Out of Food in the Last Year: Never true    Ran Out of Food in the Last Year: Never true  Transportation Needs: No Transportation Needs (12/18/2023)   PRAPARE - Administrator, Civil Service (Medical): No    Lack of Transportation (Non-Medical): No  Physical Activity: Sufficiently Active (12/18/2023)   Exercise Vital Sign    Days of Exercise per Week: 7 days    Minutes of Exercise per Session: 90 min  Stress: No Stress Concern Present (12/18/2023)   [employer] of Occupational Health - Occupational Stress Questionnaire    Feeling of Stress : Not at all  Social Connections: Moderately Integrated (12/18/2023)   Social Connection and  Isolation Panel [NHANES]    Frequency of Communication with Friends and Family: More than three times a week    Frequency of Social Gatherings with Friends and Family: More than three times a week    Attends Religious Services: More than 4 times per year    Active Member of [employer] or Organizations: Yes    Attends Banker Meetings: More than 4 times per year    Marital Status: Widowed    Tobacco Counseling Counseling given: Not Answered    Clinical Intake:  Pre-visit preparation completed: Yes  Pain : No/denies pain Pain Score: 0-No pain     BMI - recorded: 27.28 Nutritional Status: BMI 25 -29 Overweight Nutritional Risks: None  Diabetes: No  How often do you need to have someone help you when you read instructions, pamphlets, or other written materials from your doctor or pharmacy?: 1 - Never What is the last grade level you completed in school?: COLLEGE EDUCATION  Interpreter Needed?: No  Information entered by :: Branna Cortina N. Renard Caperton, LPN.   Activities of Daily Living     12/18/2023    8:57 AM 12/17/2023   12:39 PM  In your present state of health, do you have any difficulty performing the following activities:  Hearing? 0 0  Vision? 0 0  Difficulty concentrating or making decisions? 0 0  Walking or climbing stairs? 1 1  Dressing or bathing? 0 0  Doing errands, shopping? 1 1  Preparing Food and eating ? N N  Using the Toilet? N N  In the past six months, have you accidently leaked urine? Y Y  Do you have problems with loss of bowel control? N N  Managing your Medications? N N  Managing your Finances? N N  Housekeeping or managing your Housekeeping? Malvin Johns    Patient Care Team: Marcine Matar, MD as PCP - General (Internal Medicine) York Spaniel, MD (Inactive) as Consulting Physician (Neurology) Dione Booze, Bertram Millard, MD as Consulting Physician (Ophthalmology)  Indicate any recent Medical Services you may have received from other than [facility]  providers in the past year (date may be approximate).     Assessment:   This is a routine wellness examination for Belina.  Hearing/Vision screen Hearing Screening - Comments:: Denies hearing difficulties. No hearing aids.  Vision Screening - Comments:: Patient does not wear rx glasses - up to date with routine eye exams [facility]    Goals Addressed             This Visit's Progress    My goal is to keep exercising and keep my muscles strong.         Depression Screen     12/18/2023    8:55 AM 03/22/2023   10:40 AM 11/20/2022   11:35 AM 07/21/2022    1:47 PM 12/28/2021   10:54 AM 08/04/2021   11:01 AM 02/09/2021    2:38 PM  PHQ 2/9 Scores  PHQ - 2 Score 0 0 0 0 0 4 2  PHQ- 9 Score 0 1 1   15 4     Fall Risk     12/18/2023    8:55 AM 12/17/2023   12:39 PM 03/22/2023   10:35 AM 12/08/2022   10:33 AM 11/20/2022   11:35 AM  Fall Risk   Falls in the past year? 1 1 1  0 1  Number falls in past yr: 0 0 0 0   Injury with Fall? 1 1 1  0 1  Risk for fall due to : Impaired balance/gait  History of fall(s) No Fall Risks History of fall(s)  Follow up Falls evaluation completed;Education provided        MEDICARE RISK AT HOME:  Medicare Risk at Home Any stairs in or around the home?: Yes If so, are there any without handrails?: No Home free of loose throw rugs in walkways, pet beds, electrical cords, etc?: Yes Adequate lighting in your home to reduce risk of falls?: Yes Life alert?: Yes Use of a cane, walker or w/c?: Yes Grab bars in the bathroom?: Yes Shower chair or bench in shower?: Yes Elevated toilet seat or a handicapped toilet?: No  TIMED UP AND GO:  Was the  test performed?  No  Cognitive Function: 6CIT completed    12/18/2023    8:57 AM 12/08/2022   10:35 AM  MMSE - Mini Mental State Exam  Not completed: Unable to complete   Orientation to time  5  Orientation to Place  5  Registration  3  Attention/ Calculation  5  Recall  3  Language- name 2  objects  2  Language- repeat  1  Language- follow 3 step command  3  Language- read & follow direction  1  Write a sentence  1  Copy design  1  Total score  30        12/18/2023    8:57 AM 12/08/2022   10:37 AM  6CIT Screen  What Year? 0 points 0 points  What month? 0 points 0 points  What time? 0 points 0 points  Count back from 20 0 points 0 points  Months in reverse 0 points   Repeat phrase 0 points 0 points  Total Score 0 points     Immunizations Immunization History  Administered Date(s) Administered   Fluad Quad(high Dose 65+) 08/12/2019, 07/21/2022   Fluad Trivalent(High Dose 65+) 07/10/2023   Influenza, High Dose Seasonal PF 10/15/2018   Influenza,inj,Quad PF,6+ Mos 10/11/2015, 06/08/2016, 08/03/2017, 08/04/2021   PFIZER Comirnaty(Gray Top)Covid-19 Tri-Sucrose Vaccine 11/16/2020   PFIZER(Purple Top)SARS-COV-2 Vaccination 12/14/2019, 01/07/2020   Pneumococcal Conjugate (Pcv15) 07/21/2022   Pneumococcal Polysaccharide-23 11/21/2016   Tdap 07/28/2014, 11/29/2020   Zoster Recombinant(Shingrix) 08/12/2019, 01/31/2020    Screening Tests Health Maintenance  Topic Date Due   COVID-19 Vaccine (4 - 2024-25 season) 06/24/2023   Medicare Annual Wellness (AWV)  12/17/2024   DTaP/Tdap/Td (3 - Td or Tdap) 11/29/2030   Pneumonia Vaccine 31+ Years old  Completed   INFLUENZA VACCINE  Completed   DEXA SCAN  Completed   Zoster Vaccines- Shingrix  Completed   HPV VACCINES  Aged Out    Health Maintenance  Health Maintenance Due  Topic Date Due   COVID-19 Vaccine (4 - 2024-25 season) 06/24/2023   Health Maintenance Items Addressed: Yes Patient is due for Covid-19 vaccine.  Additional Screening:  Vision Screening: Recommended annual ophthalmology exams for early detection of glaucoma and other disorders of the eye.  Dental Screening: Recommended annual dental exams for proper oral hygiene  Community Resource Referral / Chronic Care Management: CRR required this visit?   No   CCM required this visit?  No     Plan:     I have personally reviewed and noted the following in the patient's chart:   Medical and social history Use of alcohol, tobacco or illicit drugs  Current medications and supplements including opioid prescriptions. Patient is not currently taking opioid prescriptions. Functional ability and status Nutritional status Physical activity Advanced directives List of other physicians Hospitalizations, surgeries, and ER visits in previous 12 months Vitals Screenings to include cognitive, depression, and falls Referrals and appointments  In addition, I have reviewed and discussed with patient certain preventive protocols, quality metrics, and best practice recommendations. A written personalized care plan for preventive services as well as general preventive health recommendations were provided to patient.     Mickeal Needy, LPN   9/56/2130   After Visit Summary: (MyChart) Due to this being a telephonic visit, the after visit summary with patients personalized plan was offered to patient via MyChart   Notes: Nothing significant to report at this time.

## 2023-12-24 DIAGNOSIS — M79601 Pain in right arm: Secondary | ICD-10-CM | POA: Diagnosis not present

## 2023-12-24 DIAGNOSIS — S46211A Strain of muscle, fascia and tendon of other parts of biceps, right arm, initial encounter: Secondary | ICD-10-CM | POA: Diagnosis not present

## 2024-01-31 ENCOUNTER — Other Ambulatory Visit: Payer: Self-pay | Admitting: Internal Medicine

## 2024-01-31 DIAGNOSIS — M25473 Effusion, unspecified ankle: Secondary | ICD-10-CM

## 2024-01-31 MED ORDER — FUROSEMIDE 20 MG PO TABS: ORAL_TABLET | ORAL | 0 refills | Status: DC

## 2024-01-31 NOTE — Telephone Encounter (Signed)
 Requested Prescriptions  Pending Prescriptions Disp Refills   furosemide (LASIX) 20 MG tablet 30 tablet 0    Sig: TAKE 1 TABLET BY MOUTH EVERY DAY FOR 5 DAYS THEN 1 EVERY DAY AS NEEDED FOR SWELLING     Cardiovascular:  Diuretics - Loop Failed - 01/31/2024  3:21 PM      Failed - K in normal range and within 180 days    Potassium  Date Value Ref Range Status  03/22/2023 4.4 3.5 - 5.2 mmol/L Final         Failed - Ca in normal range and within 180 days    Calcium  Date Value Ref Range Status  03/22/2023 10.0 8.7 - 10.3 mg/dL Final         Failed - Na in normal range and within 180 days    Sodium  Date Value Ref Range Status  03/22/2023 139 134 - 144 mmol/L Final         Failed - Cr in normal range and within 180 days    Creatinine, Ser  Date Value Ref Range Status  03/22/2023 0.89 0.57 - 1.00 mg/dL Final         Failed - Cl in normal range and within 180 days    Chloride  Date Value Ref Range Status  03/22/2023 102 96 - 106 mmol/L Final         Failed - Mg Level in normal range and within 180 days    No results found for: "MG"       Passed - Last BP in normal range    BP Readings from Last 1 Encounters:  10/19/23 124/79         Passed - Valid encounter within last 6 months    Recent Outpatient Visits           3 months ago Hypothyroidism, unspecified type   [facility] Dept Of [place]. [facility] Marcine Matar, MD   6 months ago Hypothyroidism, unspecified type   [facility] - A Dept Of [place]. [facility] Marcine Matar, MD   10 months ago Left lumbar radiculopathy   [facility] - A Dept Of [place]. [facility] Marcine Matar, MD   1 year ago Osteopenia after menopause   [facility] - A Dept Of [place]. [facility] Marcine Matar, MD   1 year ago Closed fracture of right hip, sequela   [facility]  - A Dept Of [place]. [facility] Marcine Matar, MD       Future Appointments             In 2 weeks Marcine Matar, MD [facility] - A Dept Of [place]igha Brideg[facility]. [facility]

## 2024-01-31 NOTE — Telephone Encounter (Signed)
 Copied from CRM [id number]. Topic: Clinical - Medication Refill >> Jan 31, 2024 12:00 PM Tiffany S wrote: Most Recent Primary Care Visit:  Provider: Mickeal Needy  Department: [facility]  Visit Type: MEDICARE AWV, SEQUENTIAL  Date: 12/18/2023  Medication: furosemide (LASIX) 20 MG tablet [[id number]]  Has the patient contacted their pharmacy? Yes (Agent: If no, request that the patient contact the pharmacy for the refill. If patient does not wish to contact the pharmacy document the reason why and proceed with request.) (Agent: If yes, when and what did the pharmacy advise?)  Is this the correct pharmacy for this prescription? Yes If no, delete pharmacy and type the correct one.  This is the patient's preferred pharmacy:  [id number] - [place], [place] - [address] AT [facility] OF [address] [place]  [address] Phone: [phone] Fax: [phone]   Has the prescription been filled recently? Yes  Is the patient out of the medication? Yes  Has the patient been seen for an appointment in the last year OR does the patient have an upcoming appointment? Yes  Can we respond through MyChart? Yes  Agent: Please be advised that Rx refills may take up to 3 business days. We ask that you follow-up with your pharmacy.

## 2024-02-01 ENCOUNTER — Other Ambulatory Visit: Payer: Self-pay

## 2024-02-01 ENCOUNTER — Emergency Department (HOSPITAL_COMMUNITY)
Admission: EM | Admit: 2024-02-01 | Discharge: 2024-02-01 | Disposition: A | Discharge status: Home / Self Care | Attending: Emergency Medicine | Admitting: Emergency Medicine

## 2024-02-01 ENCOUNTER — Emergency Department (HOSPITAL_COMMUNITY)

## 2024-02-01 DIAGNOSIS — I444 Left anterior fascicular block: Secondary | ICD-10-CM | POA: Diagnosis not present

## 2024-02-01 DIAGNOSIS — R600 Localized edema: Secondary | ICD-10-CM

## 2024-02-01 DIAGNOSIS — D649 Anemia, unspecified: Secondary | ICD-10-CM | POA: Diagnosis not present

## 2024-02-01 DIAGNOSIS — R9431 Abnormal electrocardiogram [ECG] [EKG]: Secondary | ICD-10-CM | POA: Diagnosis not present

## 2024-02-01 DIAGNOSIS — R609 Edema, unspecified: Secondary | ICD-10-CM | POA: Insufficient documentation

## 2024-02-01 DIAGNOSIS — R2242 Localized swelling, mass and lump, left lower limb: Secondary | ICD-10-CM | POA: Diagnosis not present

## 2024-02-01 DIAGNOSIS — L03116 Cellulitis of left lower limb: Secondary | ICD-10-CM | POA: Insufficient documentation

## 2024-02-01 DIAGNOSIS — E669 Obesity, unspecified: Secondary | ICD-10-CM | POA: Diagnosis not present

## 2024-02-01 DIAGNOSIS — M7989 Other specified soft tissue disorders: Secondary | ICD-10-CM

## 2024-02-01 DIAGNOSIS — L03115 Cellulitis of right lower limb: Secondary | ICD-10-CM

## 2024-02-01 DIAGNOSIS — G629 Polyneuropathy, unspecified: Secondary | ICD-10-CM | POA: Insufficient documentation

## 2024-02-01 LAB — COMPREHENSIVE METABOLIC PANEL WITH GFR
ALT: 13 U/L (ref 0–44)
AST: 21 U/L (ref 15–41)
Albumin: 3.8 g/dL (ref 3.5–5.0)
Alkaline Phosphatase: 43 U/L (ref 38–126)
Anion gap: 8 (ref 5–15)
BUN: 19 mg/dL (ref 8–23)
CO2: 23 mmol/L (ref 22–32)
Calcium: 8.9 mg/dL (ref 8.9–10.3)
Chloride: 103 mmol/L (ref 98–111)
Creatinine, Ser: 0.91 mg/dL (ref 0.44–1.00)
GFR, Estimated: 60 mL/min — ABNORMAL LOW (ref >60)
Glucose, Bld: 96 mg/dL (ref 70–99)
Potassium: 3.8 mmol/L (ref 3.5–5.1)
Sodium: 134 mmol/L — ABNORMAL LOW (ref 135–145)
Total Bilirubin: 1 mg/dL (ref 0.0–1.2)
Total Protein: 6.6 g/dL (ref 6.5–8.1)

## 2024-02-01 LAB — I-STAT CG4 LACTIC ACID, ED: Lactic Acid, Venous: 0.8 mmol/L (ref 0.5–1.9)

## 2024-02-01 LAB — CBC
HCT: 39.5 % (ref 36.0–46.0)
Hemoglobin: 12.9 g/dL (ref 12.0–15.0)
MCH: 31.4 pg (ref 26.0–34.0)
MCHC: 32.7 g/dL (ref 30.0–36.0)
MCV: 96.1 fL (ref 80.0–100.0)
Platelets: 211 10*3/uL (ref 150–400)
RBC: 4.11 MIL/uL (ref 3.87–5.11)
RDW: 12.7 % (ref 11.5–15.5)
WBC: 4.3 10*3/uL (ref 4.0–10.5)
nRBC: 0 % (ref 0.0–0.2)

## 2024-02-01 LAB — TSH: TSH: 4.315 u[IU]/mL (ref 0.350–4.500)

## 2024-02-01 MED ORDER — FUROSEMIDE 10 MG/ML IJ SOLN
20.0000 mg | Freq: Once | INTRAMUSCULAR | Status: AC
  Administered 2024-02-01: 20 mg via INTRAVENOUS
  Filled 2024-02-01: qty 4

## 2024-02-01 MED ORDER — SODIUM CHLORIDE 0.9 % IV SOLN
1.0000 g | Freq: Once | INTRAVENOUS | Status: AC
  Administered 2024-02-01: 1 g via INTRAVENOUS
  Filled 2024-02-01: qty 10

## 2024-02-01 MED ORDER — POTASSIUM CHLORIDE ER 10 MEQ PO TBCR: 10.0000 meq | EXTENDED_RELEASE_TABLET | Freq: Every day | ORAL | 0 refills | Status: DC

## 2024-02-01 MED ORDER — DOXYCYCLINE HYCLATE 100 MG PO CAPS
100.0000 mg | ORAL_CAPSULE | Freq: Two times a day (BID) | ORAL | 0 refills | Status: DC
Start: 2024-02-01 — End: 2024-04-16

## 2024-02-01 MED ORDER — FUROSEMIDE 20 MG PO TABS: 20.0000 mg | ORAL_TABLET | Freq: Every day | ORAL | 0 refills | Status: DC | PRN

## 2024-02-01 NOTE — ED Notes (Signed)
 Vascular tech at bedside.

## 2024-02-01 NOTE — ED Provider Notes (Signed)
 [place] [facility]GENCY DEPARTMENT AT [facility] Provider Note   CSN: [id number] Arrival date & time: 02/01/24  1241     History  Chief Complaint  Patient presents with   Leg Swelling    Krystal Moore is a 89 y.o. female.  Pt is a 89 yo female with pmhx significant for polyneuropathy, spinal stenosis anemia, and obesity.  Pt has bilateral leg swelling and redness.  Sx started a few days ago.  She has a rx for lasix, but instructions say to only take it for 5 days.  She did not want to take it more than she should, so she came in.  She denies fevers.  She is quite mentally sharp and still works as a Forensic scientist.  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       Home Medications Prior to Admission medications   Medication Sig Start Date End Date Taking? Authorizing Provider  alendronate (FOSAMAX) 70 MG tablet Take 1 tablet (70 mg total) by mouth every 7 (seven) days. Take with a full glass of water on an empty stomach. Sit upright for 30 minutes after taking. 05/16/23   Marcine Matar, MD  calcium carbonate (OS-CAL - DOSED IN MG OF ELEMENTAL CALCIUM) 1250 (500 Ca) MG tablet Take 1 tablet by mouth daily.    [provider]  Cholecalciferol (VITAMIN D3) 125 MCG (5000 UT) CAPS Take 1 capsule by mouth daily.    [provider]  cyanocobalamin (VITAMIN B12) 1000 MCG tablet Take 2,500 mcg by mouth daily.    [provider]  furosemide (LASIX) 20 MG tablet TAKE 1 TABLET BY MOUTH EVERY DAY FOR 5 DAYS THEN 1 EVERY DAY AS NEEDED FOR SWELLING 01/31/24   Marcine Matar, MD  gabapentin (NEURONTIN) 100 MG capsule Take 1 capsule (100 mg total) by mouth at bedtime. 11/20/22   Marcine Matar, MD  Krill Oil 1000 MG CAPS Take 1,000 mg by mouth daily.    [provider]  levothyroxine (SYNTHROID) 112 MCG tablet TAKE 1 TABLET BY MOUTH EVERY DAY BEFORE BREAKFAST 11/29/23   Marcine Matar, MD  tiZANidine (ZANAFLEX) 4 MG tablet Take 1  tablet (4 mg total) by mouth 2 (two) times daily as needed for muscle spasms. 09/11/22   Marcine Matar, MD      Allergies    Elemental sulfur, Milk-related compounds, Poison ivy extract [poison ivy extract], Tobacco [tobacco], Cymbalta [duloxetine hcl], and Orange juice [orange oil]    Review of Systems   Review of Systems  Cardiovascular:  Positive for leg swelling.  All other systems reviewed and are negative.   Physical Exam Updated Vital Signs BP (!) 148/73 (BP Location: Left Arm)   Pulse (!) 55   Temp (!) 97.5 F (36.4 C) (Oral)   Resp 17   Ht 5\' 2"  (1.575 m)   Wt 66.7 kg   SpO2 100%   BMI 26.89 kg/m  Physical Exam Vitals and nursing note reviewed.  Constitutional:      Appearance: Normal appearance.  HENT:     Head: Normocephalic and atraumatic.     Right Ear: External ear normal.     Left Ear: External ear normal.     Nose: Nose normal.     Mouth/Throat:     Mouth: Mucous membranes are moist.     Pharynx: Oropharynx is clear.  Eyes:     Extraocular Movements: Extraocular movements intact.     Conjunctiva/sclera: Conjunctivae normal.  Pupils: Pupils are equal, round, and reactive to light.  Cardiovascular:     Rate and Rhythm: Normal rate and regular rhythm.     Pulses: Normal pulses.     Heart sounds: Normal heart sounds.  Pulmonary:     Effort: Pulmonary effort is normal.     Breath sounds: Normal breath sounds.  Abdominal:     General: Abdomen is flat. Bowel sounds are normal.     Palpations: Abdomen is soft.  Musculoskeletal:        General: Normal range of motion.     Cervical back: Normal range of motion and neck supple.     Right lower leg: Edema present.     Left lower leg: Edema present.  Skin:    General: Skin is warm and dry.     Capillary Refill: Capillary refill takes less than 2 seconds.     Comments: Redness to both lower legs which is new in the last few days  Neurological:     General: No focal deficit present.     Mental  Status: She is alert and oriented to person, place, and time.  Psychiatric:        Mood and Affect: Mood normal.        Behavior: Behavior normal.     ED Results / Procedures / Treatments   Labs (all labs ordered are listed, but only abnormal results are displayed) Labs Reviewed  COMPREHENSIVE METABOLIC PANEL WITH GFR - Abnormal; Notable for the following components:      Result Value   Sodium 134 (*)    GFR, Estimated 60 (*)    All other components within normal limits  CBC  TSH  I-STAT CG4 LACTIC ACID, ED  I-STAT CG4 LACTIC ACID, ED    EKG EKG Interpretation Date/Time:  Friday February 01 2024 16:43:38 EDT Ventricular Rate:  57 PR Interval:  244 QRS Duration:  120 QT Interval:  465 QTC Calculation: 453 R Axis:   -64  Text Interpretation: Sinus rhythm Prolonged PR interval Left anterior fascicular block Anterior infarct, old No significant change since last tracing Confirmed by Jacalyn Lefevre [id number]) on 02/01/2024 4:45:53 PM  Radiology VAS [place] LOWER EXTREMITY VENOUS (DVT) (7a-7p) Result Date: 02/01/2024  Lower Venous DVT Study Patient Name:  Krystal Moore  Date of Exam:   02/01/2024 Medical Rec #: [id number]                Accession #:    [id number] Date of Birth: 03-24-1930               Patient Gender: F Patient Age:   89 years Exam Location:  [facility] Procedure:      VAS [place] LOWER EXTREMITY VENOUS (DVT) Referring Phys: Iridessa Harrow --------------------------------------------------------------------------------  Indications: Pain, Swelling, Edema, and Erythema.  Comparison Study: No prior exam. Performing Technologist: Fernande Bras  Examination Guidelines: A complete evaluation includes B-mode imaging, spectral Doppler, color Doppler, and power Doppler as needed of all accessible portions of each vessel. Bilateral testing is considered an integral part of a complete examination. Limited examinations for reoccurring indications may be performed as noted.  The reflux portion of the exam is performed with the patient in reverse Trendelenburg.  +---------+---------------+---------+-----------+----------+--------------+ RIGHT    CompressibilityPhasicitySpontaneityPropertiesThrombus Aging +---------+---------------+---------+-----------+----------+--------------+ CFV      Full           Yes      Yes                                 +---------+---------------+---------+-----------+----------+--------------+  SFJ      Full           Yes      Yes                                 +---------+---------------+---------+-----------+----------+--------------+ FV Prox  Full                                                        +---------+---------------+---------+-----------+----------+--------------+ FV Mid   Full                                                        +---------+---------------+---------+-----------+----------+--------------+ FV DistalFull                                                        +---------+---------------+---------+-----------+----------+--------------+ PFV      Full                                                        +---------+---------------+---------+-----------+----------+--------------+ POP      Full           Yes      Yes                                 +---------+---------------+---------+-----------+----------+--------------+ PTV      Full                                                        +---------+---------------+---------+-----------+----------+--------------+ PERO     Full                                                        +---------+---------------+---------+-----------+----------+--------------+   +---------+---------------+---------+-----------+----------+--------------+ LEFT     CompressibilityPhasicitySpontaneityPropertiesThrombus Aging +---------+---------------+---------+-----------+----------+--------------+ CFV      Full           Yes       Yes                                 +---------+---------------+---------+-----------+----------+--------------+ SFJ      Full           Yes      Yes                                 +---------+---------------+---------+-----------+----------+--------------+  FV Prox  Full                                                        +---------+---------------+---------+-----------+----------+--------------+ FV Mid   Full                                                        +---------+---------------+---------+-----------+----------+--------------+ FV DistalFull                                                        +---------+---------------+---------+-----------+----------+--------------+ PFV      Full                                                        +---------+---------------+---------+-----------+----------+--------------+ POP      Full           Yes      Yes                                 +---------+---------------+---------+-----------+----------+--------------+ PTV      Full                                                        +---------+---------------+---------+-----------+----------+--------------+ PERO     Full                                                        +---------+---------------+---------+-----------+----------+--------------+     Summary: BILATERAL: - No evidence of deep vein thrombosis seen in the lower extremities, bilaterally. -No evidence of popliteal cyst, bilaterally.   *See table(s) above for measurements and observations.    Preliminary     Procedures Procedures    Medications Ordered in ED Medications  cefTRIAXone (ROCEPHIN) 1 g in sodium chloride 0.9 % 100 mL IVPB (1 g Intravenous New Bag/Given 02/01/24 1705)  furosemide (LASIX) injection 20 mg (20 mg Intravenous Given 02/01/24 1704)    ED Course/ Medical Decision Making/ A&P                                 Medical Decision Making Amount and/or Complexity  of Data Reviewed Labs: ordered.  Risk Prescription drug management.   This patient presents to the ED for concern of leg swelling, this involves an extensive number of treatment options, and is a complaint that carries with it a high risk of complications  and morbidity.  The differential diagnosis includes dvt, peripheral edema, cellulitis   Co morbidities that complicate the patient evaluation  polyneuropathy, spinal stenosis anemia, and obesity   Additional history obtained:  Additional history obtained from epic chart review  Lab Tests:  I Ordered, and personally interpreted labs.  The pertinent results include:  cbc nl, cmp nl, lactic 0.8, tsh nl at 4.3   Imaging Studies ordered:  I ordered imaging studies including [place]  I independently visualized and interpreted imaging which showed  No evidence of deep vein thrombosis seen in the lower extremities, bilaterally.  -No evidence of popliteal cyst, bilaterally.   I agree with the radiologist interpretation   Cardiac Monitoring:  The patient was maintained on a cardiac monitor.  I personally viewed and interpreted the cardiac monitored which showed an underlying rhythm of: nsr   Medicines ordered and prescription drug management:  I ordered medication including lasix  for swelling  Reevaluation of the patient after these medicines showed that the patient improved I have reviewed the patients home medicines and have made adjustments as needed   Test Considered:  [place]   Critical Interventions:  abx   Problem List / ED Course:  Peripheral edema:  Swelling has improved with iv lasix.  pt d/c with lasix/kcl Cellulitis:  pt d/c with doxy   Reevaluation:  After the interventions noted above, I reevaluated the patient and found that they have :improved   Social Determinants of Health:  Lives at home   Dispostion:  After consideration of the diagnostic results and the patients response to treatment, I  feel that the patent would benefit from discharge with outpatient f/u.          Final Clinical Impression(s) / ED Diagnoses Final diagnoses:  Peripheral edema  Cellulitis of left lower extremity    Rx / DC Orders ED Discharge Orders     None         Jacalyn Lefevre, MD 02/01/24 1826

## 2024-02-01 NOTE — ED Triage Notes (Signed)
 Pt arrived via POV. C/o LLE swelling for 4x days. +3 pitting edema on both legs.  States they feel a burning sensation above knee

## 2024-02-11 ENCOUNTER — Ambulatory Visit: Payer: Self-pay

## 2024-02-11 ENCOUNTER — Other Ambulatory Visit: Payer: Self-pay | Admitting: Internal Medicine

## 2024-02-11 NOTE — Telephone Encounter (Signed)
 Noted.

## 2024-02-11 NOTE — Telephone Encounter (Signed)
  Chief Complaint: ankle swelling Symptoms: bilateral ankle swelling and pink, pain in ankles Frequency: 1 week ago Pertinent Negatives: Patient denies fevers, sob, chest pain Disposition: [] ED /[] Urgent Care (no appt availability in office) / [x] Appointment(In office/virtual)/ []  [facility]/ [] Home Care/ [x] Refused Recommended Disposition /[] [facility]/ []  Follow-up with PCP Additional Notes: Patient reports she is experiencing bilateral ankle swelling. She states she was seen in the ED last week for similar issues and was diagnosed with cellulitis. Patient reports she was prescribed Doxycycline  and Lasix , she completed her Doxycycline  today. Patient reports the swelling has gone down with the Doxycycline  but the swelling is still present (though she states is has decreased and is now only in her ankles) and is still painful. So she is requesting an extention of the Doxycycline  until her follow-up appt on Monday 4/28. Advised patient would forward request to appropriate person for follow-up. Advised patient to call back with worsening symptoms. Patient verbalized understanding.     Copied from CRM [id number]. Topic: Clinical - Red Word Triage >> Feb 11, 2024  9:46 AM Everette C wrote: [facility] that prompted transfer to Nurse Triage: The patient is continuing to experience swelling and discomfort in their left and right ankles that they've ben previously treated/seen for. The patient shares that their swelling is decreasing but still painful Reason for Disposition  MILD or MODERATE ankle swelling (e.g., can't move joint normally, can't do usual activities) (Exceptions: Itchy, localized swelling; swelling is chronic.)  Answer Assessment - Initial Assessment Questions 1. LOCATION: "Which ankle is swollen?" "Where is the swelling?"     bilateral 2. ONSET: "When did the swelling start?"     1 week ago, worsening 3. SWELLING: "How bad is the swelling?" Or, "How large is it?"  (e.g., mild, moderate, severe; size of localized swelling)    - NONE: No joint swelling.   - LOCALIZED: Localized; small area of puffy or swollen skin (e.g., insect bite, skin irritation).   - MILD: Joint looks or feels mildly swollen or puffy.   - MODERATE: Swollen; interferes with normal activities (e.g., work or school); decreased range of movement; may be limping.   - SEVERE: Very swollen; can't move swollen joint at all; limping a lot or unable to walk.     mild 4. PAIN: "Is there any pain?" If Yes, ask: "How bad is it?" (Scale 1-10; or mild, moderate, severe)   - NONE (0): no pain.   - MILD (1-3): doesn't interfere with normal activities.    - MODERATE (4-7): interferes with normal activities (e.g., work or school) or awakens from sleep, limping.    - SEVERE (8-10): excruciating pain, unable to do any normal activities, unable to walk.      moderate 5. CAUSE: "What do you think caused the ankle swelling?"     Unsure, maybe DVT 6. OTHER SYMPTOMS: "Do you have any other symptoms?" (e.g., fever, chest pain, difficulty breathing, calf pain)     denies  Protocols used: Ankle Swelling-A-AH

## 2024-02-18 ENCOUNTER — Ambulatory Visit: Payer: Medicare HMO | Attending: Internal Medicine | Admitting: Internal Medicine

## 2024-02-18 ENCOUNTER — Encounter: Payer: Self-pay | Admitting: Internal Medicine

## 2024-02-18 VITALS — BP 113/70 | HR 74 | Temp 98.2°F | Ht 62.0 in | Wt 158.0 lb

## 2024-02-18 DIAGNOSIS — E039 Hypothyroidism, unspecified: Secondary | ICD-10-CM

## 2024-02-18 DIAGNOSIS — M25511 Pain in right shoulder: Secondary | ICD-10-CM

## 2024-02-18 DIAGNOSIS — R600 Localized edema: Secondary | ICD-10-CM

## 2024-02-18 DIAGNOSIS — M810 Age-related osteoporosis without current pathological fracture: Secondary | ICD-10-CM

## 2024-02-18 NOTE — Patient Instructions (Signed)
 VISIT SUMMARY:  You came in today for your four-month follow-up. We discussed your ongoing management of osteoporosis and hypothyroidism, as well as recent issues with leg swelling and a shoulder injury. You have been following your medication regimen and exercise routine well. We also reviewed your recent experience with cellulitis and the subsequent treatment.  YOUR PLAN:  -SHOULDER PAIN: You have persistent pain in your right shoulder following a fall. This pain is worse with certain movements. We will refer you to an orthopedic specialist for further evaluation and possible imaging. In the meantime, you can take acetaminophen  as needed for pain relief.  -EDEMA: You have swelling in your legs and ankles, which is being managed with furosemide  and potassium supplements. You should continue taking these medications. Additionally, elevate your legs when sitting and consider wearing knee-high compression socks during the day to help reduce the swelling.  -HYPOTHYROIDISM: Your hypothyroidism is well-managed with your current dose of levothyroxine . Your recent thyroid  function test is within the normal range. Continue taking levothyroxine  112 mcg daily and we will monitor your thyroid  function regularly.  -OSTEOPOROSIS: Your osteoporosis is being managed with Fosamax , calcium, and vitamin D  supplements. Continue taking these as prescribed and maintain your routine of regular weight-bearing exercises, which are beneficial for your bone health.  INSTRUCTIONS:  Please follow up with the orthopedic specialist for your shoulder pain evaluation. Continue your current medications and lifestyle recommendations. If you experience any new or worsening symptoms, please contact our office.

## 2024-02-18 NOTE — Progress Notes (Signed)
 Patient ID: Krystal Moore, female    DOB: July 06, 1930  MRN: [id number]  CC: Hypothyroidism (Hypothyroidism. /Swelling of bilateral legs & ankles, redness X2-3 weeks/Discuss cellulitis & meds)   Subjective: Krystal Moore is a 89 y.o. female who presents for chronic ds management. Her concerns today include:  Hx of osteoporosis hips on Fosamax , hypothyroid, obesity, Vit B12 def, RT CTS, OA cervical spine, lumbar spinal stenosis,   Discussed the use of AI scribe software for clinical note transcription with the patient, who gave verbal consent to proceed.  History of Present Illness   The patient, with a history of osteoporosis and hypothyroidism, presents for a four-month follow-up. She reports adherence to her current regimen of weekly Fosamax , daily calcium and vitamin D  supplements, and daily levothyroxine  112 mcg. She also engages in regular weight-bearing exercise, walking at least an hour daily.  Recently, the patient experienced significant bilateral lower extremity swelling, with the left leg more affected. The swelling was associated with redness and a sensation of the legs feeling like 'concrete.' She was diagnosed with cellulitis at the ER on 02/01/24 and completed a 7 day course of doxycycline , which improved her symptoms. She is currently taking furosemide  daily for residual swelling and potassium supplementation to prevent hypokalemia.  The patient also reports a recent injury to her right shoulder after falling against a pole while gardening. The injury resulted in significant bruising and ongoing pain, particularly with certain movements. She has been managing the pain with acetaminophen .  She also mentions discontinuing gabapentin  about a month ago due to adverse effects and has not had any issues with sleep since discontinuation.      Patient Active Problem List   Diagnosis Date Noted   Age-related osteoporosis without current pathological fracture  07/10/2023   CTS (carpal tunnel syndrome) 08/16/2022   Closed right hip fracture (HCC) 06/07/2022   Fall at home, initial encounter 06/07/2022   Numbness 07/30/2019   Spinal stenosis of lumbar region 07/30/2019   Osteopenia after menopause 05/29/2019   Over weight 05/29/2019   Elevated blood pressure reading 05/29/2019   Immunization due 08/03/2017   Left leg swelling 05/04/2017   Allergic rhinitis 10/11/2015   Hypothyroidism 12/10/2013   Peripheral neuropathy 05/16/2013     Current Outpatient Medications on File Prior to Visit  Medication Sig Dispense Refill   alendronate  (FOSAMAX ) 70 MG tablet Take 1 tablet (70 mg total) by mouth every 7 (seven) days. Take with a full glass of water on an empty stomach. Sit upright for 30 minutes after taking. 4 tablet 11   calcium carbonate (OS-CAL - DOSED IN MG OF ELEMENTAL CALCIUM) 1250 (500 Ca) MG tablet Take 1 tablet by mouth daily.     Cholecalciferol (VITAMIN D3) 125 MCG (5000 UT) CAPS Take 1 capsule by mouth daily.     cyanocobalamin  (VITAMIN B12) 1000 MCG tablet Take 2,500 mcg by mouth daily.     furosemide  (LASIX ) 20 MG tablet Take 1 tablet (20 mg total) by mouth daily as needed for edema. 30 tablet 0   gabapentin  (NEURONTIN ) 100 MG capsule Take 1 capsule (100 mg total) by mouth at bedtime. 30 capsule 3   Krill Oil 1000 MG CAPS Take 1,000 mg by mouth daily.     levothyroxine  (SYNTHROID ) 112 MCG tablet TAKE 1 TABLET BY MOUTH EVERY DAY BEFORE BREAKFAST 90 tablet 1   potassium chloride  (KLOR-CON ) 10 MEQ tablet Take 1 tablet (10 mEq total) by mouth daily. Take if you take the lasix .  Otherwise,  you don't have to take this medication. 30 tablet 0   tiZANidine  (ZANAFLEX ) 4 MG tablet Take 1 tablet (4 mg total) by mouth 2 (two) times daily as needed for muscle spasms. 30 tablet 1   doxycycline  (VIBRAMYCIN ) 100 MG capsule Take 1 capsule (100 mg total) by mouth 2 (two) times daily. (Patient not taking: Reported on 02/18/2024) 14 capsule 0   furosemide   (LASIX ) 20 MG tablet TAKE 1 TABLET BY MOUTH EVERY DAY FOR 5 DAYS THEN 1 EVERY DAY AS NEEDED FOR SWELLING (Patient not taking: Reported on 02/18/2024) 30 tablet 0   No current facility-administered medications on file prior to visit.    Allergies  Allergen Reactions   Elemental Sulfur Shortness Of Breath and Swelling   Milk-Related Compounds Nausea And Vomiting   Poison Ivy Extract [Poison Ivy Extract] Itching, Rash and Other (See Comments)    Muscle damage    Tobacco [Tobacco] Anaphylaxis and Swelling    Allergic to cigarette smoke   Cymbalta  [Duloxetine  Hcl] Diarrhea   Orange Juice [Orange Oil] Nausea And Vomiting    Social History   Socioeconomic History   Marital status: Widowed    Spouse name: Not on file   Number of children: 0   Years of education: college   Highest education level: Master's degree (e.g., MA, MS, MEng, MEd, MSW, MBA)  Occupational History   Not on file  Tobacco Use   Smoking status: Never   Smokeless tobacco: Never  Substance and Sexual Activity   Alcohol  use: Yes    Comment: 1/4 glass per day   Drug use: No   Sexual activity: Not Currently  Other Topics Concern   Not on file  Social History Narrative   ** Merged History Encounter **       07/30/19 Patient lives at home and she has a roommate.  Has a poodle.  Patient works part time Programmer, systems. Patient has a college education. Both handed. Plays organ since age 28.  Caffeine- None    Social Drivers of Health   Financial Resource Strain: Low Risk  (12/18/2023)   Overall Financial Resource Strain (CARDIA)    Difficulty of Paying Living Expenses: Not very hard  Food Insecurity: No Food Insecurity (12/18/2023)   Hunger Vital Sign    Worried About Running Out of Food in the Last Year: Never true    Ran Out of Food in the Last Year: Never true  Transportation Needs: No Transportation Needs (12/18/2023)   PRAPARE - Administrator, Civil Service (Medical): No    Lack of  Transportation (Non-Medical): No  Physical Activity: Sufficiently Active (12/18/2023)   Exercise Vital Sign    Days of Exercise per Week: 7 days    Minutes of Exercise per Session: 90 min  Stress: No Stress Concern Present (12/18/2023)   [employer] of Occupational Health - Occupational Stress Questionnaire    Feeling of Stress : Not at all  Social Connections: Moderately Integrated (12/18/2023)   Social Connection and Isolation Panel [NHANES]    Frequency of Communication with Friends and Family: More than three times a week    Frequency of Social Gatherings with Friends and Family: More than three times a week    Attends Religious Services: More than 4 times per year    Active Member of [employer] or Organizations: Yes    Attends Banker Meetings: More than 4 times per year    Marital Status: Widowed  Intimate Partner Violence: Not  At Risk (12/18/2023)   Humiliation, Afraid, Rape, and Kick questionnaire    Fear of Current or Ex-Partner: No    Emotionally Abused: No    Physically Abused: No    Sexually Abused: No    Family History  Problem Relation Age of Onset   Stroke Mother        questionable   Clotting disorder Mother    Heart attack Father     Past Surgical History:  Procedure Laterality Date   CATARACT EXTRACTION Left 06/2019   INTRAMEDULLARY (IM) NAIL INTERTROCHANTERIC Right 06/08/2022   Procedure: INTRAMEDULLARY (IM) NAIL INTERTROCHANTERIC;  Surgeon: Adonica Hoose, MD;  Location: [facility];  Service: Orthopedics;  Laterality: Right;   MOUTH SURGERY     NASAL SINUS SURGERY      ROS: Review of Systems Negative except as stated above  PHYSICAL EXAM: BP 113/70 (BP Location: Left Arm, Patient Position: Sitting, Cuff Size: Large)   Pulse 74   Temp 98.2 F (36.8 C) (Oral)   Ht 5\' 2"  (1.575 m)   Wt 158 lb (71.7 kg)   SpO2 96%   BMI 28.90 kg/m   Physical Exam  General appearance - alert, well appearing, pleasant elderly Caucasian female and in no  distress Mental status - normal mood, behavior, speech, dress, motor activity, and thought processes Neck - supple, no significant adenopathy Chest - clear to auscultation, no wheezes, rales or rhonchi, symmetric air entry Heart - normal rate, regular rhythm, normal S1, S2, no murmurs, rubs, clicks or gallops Musculoskeletal -right shoulder: No point tenderness.  She has mild discomfort with passive range of motion across the chest. Extremities -she has trace bilateral lower extremity edema with left calf being larger than the right.  Some wrinkling of skin just above the ankle.  No erythema noted at this time      Latest Ref Rng & Units 02/01/2024    1:24 PM 03/22/2023   11:56 AM 06/11/2022    6:29 AM  CMP  Glucose 70 - 99 mg/dL 96  78  98   BUN 8 - 23 mg/dL 19  16  8    Creatinine 0.44 - 1.00 mg/dL 1.61  0.96  0.45   Sodium 135 - 145 mmol/L 134  139  137   Potassium 3.5 - 5.1 mmol/L 3.8  4.4  4.1   Chloride 98 - 111 mmol/L 103  102  107   CO2 22 - 32 mmol/L 23  21  26    Calcium 8.9 - 10.3 mg/dL 8.9  40.9  9.1   Total Protein 6.5 - 8.1 g/dL 6.6  6.8    Total Bilirubin 0.0 - 1.2 mg/dL 1.0  0.4    Alkaline Phos 38 - 126 U/L 43  72    AST 15 - 41 U/L 21  19    ALT 0 - 44 U/L 13  11     Lipid Panel     Component Value Date/Time   CHOL 233 (H) 05/29/2019 1127   TRIG 55 05/29/2019 1127   HDL 90 05/29/2019 1127   CHOLHDL 2.6 05/29/2019 1127   CHOLHDL 3.1 06/08/2016 1155   VLDL 20 06/08/2016 1155   LDLCALC 132 (H) 05/29/2019 1127    CBC    Component Value Date/Time   WBC 4.3 02/01/2024 1324   RBC 4.11 02/01/2024 1324   HGB 12.9 02/01/2024 1324   HGB 13.8 03/22/2023 1156   HGB 13.5 06/07/2015 1240   HCT 39.5 02/01/2024 1324   HCT 40.6  03/22/2023 1156   HCT 41.0 06/07/2015 1240   PLT 211 02/01/2024 1324   PLT 220 03/22/2023 1156   MCV 96.1 02/01/2024 1324   MCV 90 03/22/2023 1156   MCV 91.9 06/07/2015 1240   MCH 31.4 02/01/2024 1324   MCHC 32.7 02/01/2024 1324   RDW 12.7  02/01/2024 1324   RDW 12.8 03/22/2023 1156   RDW 13.6 06/07/2015 1240   LYMPHSABS 1.6 06/07/2022 1149   LYMPHSABS 1.5 06/07/2015 1240   MONOABS 0.3 06/07/2022 1149   MONOABS 0.4 06/07/2015 1240   EOSABS 0.2 06/07/2022 1149   EOSABS 0.2 06/07/2015 1240   BASOSABS 0.0 06/07/2022 1149   BASOSABS 0.1 06/07/2015 1240    ASSESSMENT AND PLAN: 1. Hypothyroidism, unspecified type (Primary) Continue levothyroxine .  Recent TSH was within normal range  2. Age-related osteoporosis without current pathological fracture Continue Fosamax  once a week, calcium and vitamin D  supplement.  3. Edema of both legs Patient reports this has improved significantly from when she had to be seen in the emergency room.  She will continue the furosemide  and potassium supplement.  I recommend getting a pair of below the knee compression socks and wearing them during the day when she is up and active  4. Acute pain of right shoulder Patient presenting with pain in the right shoulder after falling against a metal post while outside gardening 3 weeks ago.  Advised to continue Tylenol  as needed for pain. - AMB referral to orthopedics   Patient was given the opportunity to ask questions.  Patient verbalized understanding of the plan and was able to repeat key elements of the plan.   This documentation was completed using Paediatric nurse.  Any transcriptional errors are unintentional.  No orders of the defined types were placed in this encounter.    Requested Prescriptions    No prescriptions requested or ordered in this encounter    No follow-ups on file.  Concetta Dee, MD, FACP

## 2024-03-10 ENCOUNTER — Telehealth: Payer: Self-pay | Admitting: Internal Medicine

## 2024-03-10 NOTE — Telephone Encounter (Signed)
 Nicholes Barks: please see pt's message.  Please cancel appt with [facility].

## 2024-03-10 NOTE — Telephone Encounter (Addendum)
 FYI Copied from CRM [id number]. Topic: General - Other >> Mar 10, 2024 11:17 AM Juluis Ok wrote:  Reason for CRM: Patient calling to inform provider that she does not want to be seen by [facility]. She states she attempted to cancel her appointment for 05/20, but was told to contact provider. She states she will stay with [facility] and she has an appointment for next Tuesday.

## 2024-03-11 ENCOUNTER — Ambulatory Visit: Admitting: Physician Assistant

## 2024-03-19 DIAGNOSIS — M25511 Pain in right shoulder: Secondary | ICD-10-CM | POA: Diagnosis not present

## 2024-04-09 DIAGNOSIS — M25511 Pain in right shoulder: Secondary | ICD-10-CM | POA: Diagnosis not present

## 2024-04-16 ENCOUNTER — Other Ambulatory Visit: Payer: Self-pay | Admitting: Internal Medicine

## 2024-04-23 DIAGNOSIS — M25511 Pain in right shoulder: Secondary | ICD-10-CM | POA: Diagnosis not present

## 2024-05-07 DIAGNOSIS — M25511 Pain in right shoulder: Secondary | ICD-10-CM | POA: Diagnosis not present

## 2024-05-15 DIAGNOSIS — H04123 Dry eye syndrome of bilateral lacrimal glands: Secondary | ICD-10-CM | POA: Diagnosis not present

## 2024-05-15 DIAGNOSIS — Z961 Presence of intraocular lens: Secondary | ICD-10-CM | POA: Diagnosis not present

## 2024-05-15 DIAGNOSIS — H353132 Nonexudative age-related macular degeneration, bilateral, intermediate dry stage: Secondary | ICD-10-CM | POA: Diagnosis not present

## 2024-05-15 DIAGNOSIS — H1045 Other chronic allergic conjunctivitis: Secondary | ICD-10-CM | POA: Diagnosis not present

## 2024-05-16 ENCOUNTER — Other Ambulatory Visit: Payer: Self-pay | Admitting: Internal Medicine

## 2024-05-16 DIAGNOSIS — M25511 Pain in right shoulder: Secondary | ICD-10-CM | POA: Diagnosis not present

## 2024-05-19 ENCOUNTER — Other Ambulatory Visit: Payer: Self-pay | Admitting: Internal Medicine

## 2024-05-19 DIAGNOSIS — M25511 Pain in right shoulder: Secondary | ICD-10-CM | POA: Diagnosis not present

## 2024-05-19 NOTE — Telephone Encounter (Unsigned)
 Copied from CRM [id number]. Topic: Clinical - Medication Refill >> May 19, 2024  3:14 PM Shardie S wrote: Medication:  calcium  carbonate (OS-CAL - DOSED IN MG OF ELEMENTAL CALCIUM ) 1250 (500 Ca) MG tablet  Has the patient contacted their pharmacy? Yes (Agent: If no, request that the patient contact the pharmacy for the refill. If patient does not wish to contact the pharmacy document the reason why and proceed with request.) (Agent: If yes, when and what did the pharmacy advise?)  This is the patient's preferred pharmacy:  [id number] - [place], [place] - [address] AT [facility] [address] [place] [id number] Phone: [phone] Fax: [phone]  Is this the correct pharmacy for this prescription? Yes If no, delete pharmacy and type the correct one.   Has the prescription been filled recently? No  Is the patient out of the medication? Yes  Has the patient been seen for an appointment in the last year OR does the patient have an upcoming appointment? Yes  Can we respond through MyChart? No  Agent: Please be advised that Rx refills may take up to 3 business days. We ask that you follow-up with your pharmacy.

## 2024-05-20 NOTE — Telephone Encounter (Signed)
 Requested medications are due for refill today.  yes  Requested medications are on the active medications list.  yes  Last refill. 06/07/2022  Future visit scheduled.   yes  Notes to clinic.  Medication is historical.    Requested Prescriptions  Pending Prescriptions Disp Refills   calcium  carbonate (OS-CAL - DOSED IN MG OF ELEMENTAL CALCIUM ) 1250 (500 Ca) MG tablet      Sig: Take 1 tablet (1,250 mg total) by mouth daily.     Endocrinology: Minerals - Calcium  Supplementation - calcium  carbonate Passed - 05/20/2024  4:35 PM      Passed - Ca in normal range and within 360 days    Calcium   Date Value Ref Range Status  02/01/2024 8.9 8.9 - 10.3 mg/dL Final         Passed - Valid encounter within last 12 months    Recent Outpatient Visits           3 months ago Hypothyroidism, unspecified type   [facility] [place] Barnie NOVAK, MD   7 months ago Hypothyroidism, unspecified type   [facility] - A Dept Of [place]. [facility] Barnie NOVAK, MD   10 months ago Hypothyroidism, unspecified type   [place] [facility] Dept Of [place]. [facility] Barnie NOVAK, MD   1 year ago Left lumbar radiculopathy   [place] [facility] - [facility] Dept Of [place]. [facility] Barnie NOVAK, MD   1 year ago Osteopenia after menopause   [place] [facility] - A Dept Of . [facility] Vic[facility] Barnie NOVAK, MD

## 2024-05-21 MED ORDER — CALCIUM CARBONATE 1250 (500 CA) MG PO TABS: 1.0000 | ORAL_TABLET | Freq: Every day | ORAL | 1 refills | Status: AC

## 2024-05-26 ENCOUNTER — Other Ambulatory Visit: Payer: Self-pay | Admitting: Internal Medicine

## 2024-05-26 DIAGNOSIS — E039 Hypothyroidism, unspecified: Secondary | ICD-10-CM

## 2024-05-29 DIAGNOSIS — M25511 Pain in right shoulder: Secondary | ICD-10-CM | POA: Diagnosis not present

## 2024-06-17 ENCOUNTER — Telehealth: Payer: Self-pay | Admitting: Internal Medicine

## 2024-06-17 NOTE — Telephone Encounter (Signed)
 Called patient to confirm upcoming appointment 06/19/2024, no answer. Unable to leave VM.

## 2024-06-19 ENCOUNTER — Encounter: Payer: Self-pay | Admitting: Internal Medicine

## 2024-06-19 ENCOUNTER — Ambulatory Visit: Attending: Internal Medicine | Admitting: Internal Medicine

## 2024-06-19 VITALS — BP 134/75 | HR 65 | Temp 97.8°F | Ht 62.0 in | Wt 157.0 lb

## 2024-06-19 DIAGNOSIS — R600 Localized edema: Secondary | ICD-10-CM

## 2024-06-19 DIAGNOSIS — Z7983 Long term (current) use of bisphosphonates: Secondary | ICD-10-CM | POA: Diagnosis not present

## 2024-06-19 DIAGNOSIS — E663 Overweight: Secondary | ICD-10-CM

## 2024-06-19 DIAGNOSIS — M810 Age-related osteoporosis without current pathological fracture: Secondary | ICD-10-CM | POA: Diagnosis not present

## 2024-06-19 DIAGNOSIS — G5792 Unspecified mononeuropathy of left lower limb: Secondary | ICD-10-CM | POA: Diagnosis not present

## 2024-06-19 DIAGNOSIS — E039 Hypothyroidism, unspecified: Secondary | ICD-10-CM | POA: Diagnosis not present

## 2024-06-19 DIAGNOSIS — Z79890 Hormone replacement therapy (postmenopausal): Secondary | ICD-10-CM

## 2024-06-19 DIAGNOSIS — Z6828 Body mass index (BMI) 28.0-28.9, adult: Secondary | ICD-10-CM

## 2024-06-19 MED ORDER — POTASSIUM CHLORIDE ER 10 MEQ PO TBCR: EXTENDED_RELEASE_TABLET | ORAL | 1 refills | Status: AC

## 2024-06-19 MED ORDER — ALENDRONATE SODIUM 70 MG PO TABS: 70.0000 mg | ORAL_TABLET | ORAL | 11 refills | Status: AC

## 2024-06-19 MED ORDER — FUROSEMIDE 20 MG PO TABS: 20.0000 mg | ORAL_TABLET | Freq: Every day | ORAL | 1 refills | Status: DC | PRN

## 2024-06-19 NOTE — Patient Instructions (Signed)
 VISIT SUMMARY:  Today, we reviewed your current health status and addressed several ongoing issues. Your thyroid  levels are stable, and you are managing your hypothyroidism well. We discussed your osteoporosis treatment and the need to resume your medication. We also talked about your leg symptoms from a past herniated disc and your strategies for managing them. Additionally, we reviewed your weight and general health maintenance, including the need for your annual flu shot.  YOUR PLAN:  -OSTEOPOROSIS: Osteoporosis is a condition where bones become weak and brittle. You have not taken Fosamax  for a month due to a pharmacy issue. We have resent the prescription to the pharmacy. Please take Fosamax  once weekly with water on an empty stomach and remain upright for 30 minutes after taking it.  -HYPOTHYROIDISM: Hypothyroidism is a condition where the thyroid  gland does not produce enough thyroid  hormone. Your thyroid  levels are normal as of April, and you are taking levothyroxine  100 mcg daily as prescribed. Continue with your current medication.  -LEFT THIGH MONONEUROPATHY: Mononeuropathy is a type of nerve damage that affects a single nerve. Your symptoms from the 2022 herniated disc are being managed with massage, ice packs, and tizanidine . Continue your regular walking routine, as it helps manage your symptoms.  -OVERWEIGHT: Your weight is stable at 157 lbs. You are maintaining a healthy diet and regular physical activity, which is good for your overall health.  -GENERAL HEALTH MAINTENANCE: You are due for your annual influenza vaccination. We will administer the flu vaccine today to help protect you from the flu this season.  INSTRUCTIONS:  Please follow up with the pharmacy to ensure you receive your Fosamax  prescription. Continue taking your medications as prescribed and maintain your current diet and exercise routine. If you experience any new or worsening symptoms, please contact our office.  You have received your flu shot today, so no further action is needed for that.

## 2024-06-19 NOTE — Progress Notes (Signed)
 Patient ID: Idalee Foxworthy, female    DOB: Oct 31, 1929  MRN: [id number]  CC: Hypothyroidism (Hypothyroidism f/u. Tere led feeling on L leg - preventing pt from exercising /Discuss fosamax , calcium , potassium)   Subjective: Kashmere Hearn-VonFoerster is a 89 y.o. female who presents for chronic ds management. Her concerns today include:  Hx of osteoporosis hips on Fosamax , hypothyroid, obesity, Vit B12 def, RT CTS, OA cervical spine, lumbar spinal stenosis,   Discussed the use of AI scribe software for clinical note transcription with the patient, who gave verbal consent to proceed.  History of Present Illness Acasia Hearn-VonFoerster is a 89 year old female who presents for follow-up.  Hypothyroidism: She is currently on levothyroxine  112 micrograms daily for hypothyroidism, with thyroid  levels last checked in April showing normal results.  Osteoporosis: She was prescribed Fosamax  (alendronate ) to be taken weekly but has not taken it for the past month due to refill issues. The prescription was initially sent in June with eleven refills, but she has been unable to obtain it since July. She also takes Calcium +D3 600 mg/?mcg, every other day, and vitamin D3 125 micrograms daily.  She has a history of a herniated disc from 2022, resulting in persistent heaviness and burning in her left anterior thigh. The sensation is described as a 'twitch' and 'burning' that comes and goes. She takes tizanidine  when symptoms become noticeable and uses massage and ice packs for relief.   She experiences occasional leg swelling, which she manages with massage two to three times a day. She takes furosemide  as needed for swelling, accompanied by potassium supplements, although she has run out of potassium and has not had a refill.  Her weight has remained stable over the past year, and she maintains a diet of small, frequent meals throughout the day, focusing on fruits and vegetables. She walks around  the block three times a day for 15 to 20 minutes each session.    Patient Active Problem List   Diagnosis Date Noted   Age-related osteoporosis without current pathological fracture 07/10/2023   CTS (carpal tunnel syndrome) 08/16/2022   Closed right hip fracture (HCC) 06/07/2022   Fall at home, initial encounter 06/07/2022   Numbness 07/30/2019   Spinal stenosis of lumbar region 07/30/2019   Osteopenia after menopause 05/29/2019   Over weight 05/29/2019   Elevated blood pressure reading 05/29/2019   Immunization due 08/03/2017   Left leg swelling 05/04/2017   Allergic rhinitis 10/11/2015   Hypothyroidism 12/10/2013   Peripheral neuropathy 05/16/2013     Current Outpatient Medications on File Prior to Visit  Medication Sig Dispense Refill   calcium  carbonate (OS-CAL - DOSED IN MG OF ELEMENTAL CALCIUM ) 1250 (500 Ca) MG tablet Take 1 tablet (1,250 mg total) by mouth daily. 90 tablet 1   Cholecalciferol (VITAMIN D3) 125 MCG (5000 UT) CAPS Take 1 capsule by mouth daily.     cyanocobalamin  (VITAMIN B12) 1000 MCG tablet Take 2,500 mcg by mouth daily.     Krill Oil 1000 MG CAPS Take 1,000 mg by mouth daily.     levothyroxine  (SYNTHROID ) 112 MCG tablet TAKE 1 TABLET BY MOUTH EVERY DAY BEFORE BREAKFAST 90 tablet 1   tiZANidine  (ZANAFLEX ) 4 MG tablet Take 1 tablet (4 mg total) by mouth 2 (two) times daily as needed for muscle spasms. 30 tablet 1   No current facility-administered medications on file prior to visit.    Allergies  Allergen Reactions   Elemental Sulfur Shortness Of Breath and Swelling  Milk-Related Compounds Nausea And Vomiting   Poison Ivy Extract [Poison Ivy Extract] Itching, Rash and Other (See Comments)    Muscle damage    Tobacco [Tobacco] Anaphylaxis and Swelling    Allergic to cigarette smoke   Cymbalta  [Duloxetine  Hcl] Diarrhea   Orange Juice [Orange Oil] Nausea And Vomiting    Social History   Socioeconomic History   Marital status: Widowed    Spouse  name: Not on file   Number of children: 0   Years of education: college   Highest education level: Master's degree (e.g., MA, MS, MEng, MEd, MSW, MBA)  Occupational History   Not on file  Tobacco Use   Smoking status: Never   Smokeless tobacco: Never  Substance and Sexual Activity   Alcohol  use: Yes    Comment: 1/4 glass per day   Drug use: No   Sexual activity: Not Currently  Other Topics Concern   Not on file  Social History Narrative   ** Merged History Encounter **       07/30/19 Patient lives at home and she has a roommate.  Has a poodle.  Patient works part time Programmer, systems. Patient has a college education. Both handed. Plays organ since age 61.  Caffeine- None    Social Drivers of Health   Financial Resource Strain: Low Risk  (06/19/2024)   Overall Financial Resource Strain (CARDIA)    Difficulty of Paying Living Expenses: Not hard at all  Food Insecurity: No Food Insecurity (06/19/2024)   Hunger Vital Sign    Worried About Running Out of Food in the Last Year: Never true    Ran Out of Food in the Last Year: Never true  Transportation Needs: No Transportation Needs (06/19/2024)   PRAPARE - Administrator, Civil Service (Medical): No    Lack of Transportation (Non-Medical): No  Physical Activity: Sufficiently Active (06/19/2024)   Exercise Vital Sign    Days of Exercise per Week: 5 days    Minutes of Exercise per Session: 60 min  Stress: No Stress Concern Present (06/19/2024)   [employer] of Occupational Health - Occupational Stress Questionnaire    Feeling of Stress: Not at all  Social Connections: Moderately Integrated (06/19/2024)   Social Connection and Isolation Panel    Frequency of Communication with Friends and Family: More than three times a week    Frequency of Social Gatherings with Friends and Family: More than three times a week    Attends Religious Services: More than 4 times per year    Active Member of [employer] or Organizations:  Yes    Attends Banker Meetings: 1 to 4 times per year    Marital Status: Widowed  Intimate Partner Violence: Not At Risk (06/19/2024)   Humiliation, Afraid, Rape, and Kick questionnaire    Fear of Current or Ex-Partner: No    Emotionally Abused: No    Physically Abused: No    Sexually Abused: No    Family History  Problem Relation Age of Onset   Stroke Mother        questionable   Clotting disorder Mother    Heart attack Father     Past Surgical History:  Procedure Laterality Date   CATARACT EXTRACTION Left 06/2019   INTRAMEDULLARY (IM) NAIL INTERTROCHANTERIC Right 06/08/2022   Procedure: INTRAMEDULLARY (IM) NAIL INTERTROCHANTERIC;  Surgeon: Fidel Rogue, MD;  Location: [facility];  Service: Orthopedics;  Laterality: Right;   MOUTH SURGERY     NASAL SINUS  SURGERY      ROS: Review of Systems Negative except as stated above  PHYSICAL EXAM: BP 134/75 (BP Location: Left Arm, Patient Position: Sitting, Cuff Size: Large)   Pulse 65   Temp 97.8 F (36.6 C) (Oral)   Ht 5' 2 (1.575 m)   Wt 157 lb (71.2 kg)   SpO2 95%   BMI 28.72 kg/m   Wt Readings from Last 3 Encounters:  06/19/24 157 lb (71.2 kg)  02/18/24 158 lb (71.7 kg)  02/01/24 147 lb (66.7 kg)    Physical Exam  General appearance - alert, well appearing, elderly female and in no distress Mental status - normal mood, behavior, speech, dress, motor activity, and thought processes Neck - supple, no significant adenopathy Chest - clear to auscultation, no wheezes, rales or rhonchi, symmetric air entry Heart - normal rate, regular rhythm, normal S1, S2, no murmurs, rubs, clicks or gallops Extremities - trace BL LE edema Neuro: She walks with a trunk flexed forward.  Gait is slow but steady.  She ambulates unassisted.     Latest Ref Rng & Units 02/01/2024    1:24 PM 03/22/2023   11:56 AM 06/11/2022    6:29 AM  CMP  Glucose 70 - 99 mg/dL 96  78  98   BUN 8 - 23 mg/dL 19  16  8    Creatinine 0.44 - 1.00  mg/dL 9.08  9.10  9.18   Sodium 135 - 145 mmol/L 134  139  137   Potassium 3.5 - 5.1 mmol/L 3.8  4.4  4.1   Chloride 98 - 111 mmol/L 103  102  107   CO2 22 - 32 mmol/L 23  21  26    Calcium  8.9 - 10.3 mg/dL 8.9  89.9  9.1   Total Protein 6.5 - 8.1 g/dL 6.6  6.8    Total Bilirubin 0.0 - 1.2 mg/dL 1.0  0.4    Alkaline Phos 38 - 126 U/L 43  72    AST 15 - 41 U/L 21  19    ALT 0 - 44 U/L 13  11     Lipid Panel     Component Value Date/Time   CHOL 233 (H) 05/29/2019 1127   TRIG 55 05/29/2019 1127   HDL 90 05/29/2019 1127   CHOLHDL 2.6 05/29/2019 1127   CHOLHDL 3.1 06/08/2016 1155   VLDL 20 06/08/2016 1155   LDLCALC 132 (H) 05/29/2019 1127    CBC    Component Value Date/Time   WBC 4.3 02/01/2024 1324   RBC 4.11 02/01/2024 1324   HGB 12.9 02/01/2024 1324   HGB 13.8 03/22/2023 1156   HGB 13.5 06/07/2015 1240   HCT 39.5 02/01/2024 1324   HCT 40.6 03/22/2023 1156   HCT 41.0 06/07/2015 1240   PLT 211 02/01/2024 1324   PLT 220 03/22/2023 1156   MCV 96.1 02/01/2024 1324   MCV 90 03/22/2023 1156   MCV 91.9 06/07/2015 1240   MCH 31.4 02/01/2024 1324   MCHC 32.7 02/01/2024 1324   RDW 12.7 02/01/2024 1324   RDW 12.8 03/22/2023 1156   RDW 13.6 06/07/2015 1240   LYMPHSABS 1.6 06/07/2022 1149   LYMPHSABS 1.5 06/07/2015 1240   MONOABS 0.3 06/07/2022 1149   MONOABS 0.4 06/07/2015 1240   EOSABS 0.2 06/07/2022 1149   EOSABS 0.2 06/07/2015 1240   BASOSABS 0.0 06/07/2022 1149   BASOSABS 0.1 06/07/2015 1240    ASSESSMENT AND PLAN: 1. Hypothyroidism, unspecified type (Primary) Continue levothyroxine  112 mcg daily.  TSH done 4 months ago was normal.  2. Age-related osteoporosis without current pathological fracture Refill Fosamax  Patient to call me back to let me know the dose on the Os-Cal plus D that she takes every other day. - alendronate  (FOSAMAX ) 70 MG tablet; Take 1 tablet (70 mg total) by mouth once a week. Take with a full glass of water on an empty stomach. SIT UPRIGHT FOR  30 MINUTES AFTER TAKING.  Dispense: 4 tablet; Refill: 11 - VITAMIN D  25 Hydroxy (Vit-D Deficiency, Fractures)  3. Neuropathy of left lower extremity Manages with tizanidine  when needed and massage.  4. Overweight (BMI 25.0-29.9) Encouraged her to continue healthy eating habits.  Commended her that she continues to walk daily  5. Edema of both legs - potassium chloride  (KLOR-CON ) 10 MEQ tablet; 1 tab PO on days when you take Furosemide   Dispense: 30 tablet; Refill: 1 - furosemide  (LASIX ) 20 MG tablet; Take 1 tablet (20 mg total) by mouth daily as needed for edema.  Dispense: 30 tablet; Refill: 1   Patient was given the opportunity to ask questions.  Patient verbalized understanding of the plan and was able to repeat key elements of the plan.   This documentation was completed using Paediatric nurse.  Any transcriptional errors are unintentional.  Orders Placed This Encounter  Procedures   VITAMIN D  25 Hydroxy (Vit-D Deficiency, Fractures)     Requested Prescriptions   Signed Prescriptions Disp Refills   potassium chloride  (KLOR-CON ) 10 MEQ tablet 30 tablet 1    Sig: 1 tab PO on days when you take Furosemide    alendronate  (FOSAMAX ) 70 MG tablet 4 tablet 11    Sig: Take 1 tablet (70 mg total) by mouth once a week. Take with a full glass of water on an empty stomach. SIT UPRIGHT FOR 30 MINUTES AFTER TAKING.   furosemide  (LASIX ) 20 MG tablet 30 tablet 1    Sig: Take 1 tablet (20 mg total) by mouth daily as needed for edema.    No follow-ups on file.  Barnie Louder, MD, FACP

## 2024-06-24 ENCOUNTER — Ambulatory Visit

## 2024-07-18 ENCOUNTER — Ambulatory Visit: Payer: Self-pay | Admitting: Nurse Practitioner

## 2024-07-21 ENCOUNTER — Ambulatory Visit: Payer: Self-pay | Admitting: Nurse Practitioner

## 2024-07-23 ENCOUNTER — Other Ambulatory Visit: Payer: Self-pay | Admitting: Internal Medicine

## 2024-07-23 DIAGNOSIS — M5416 Radiculopathy, lumbar region: Secondary | ICD-10-CM

## 2024-07-23 NOTE — Telephone Encounter (Unsigned)
 Copied from CRM #[id number]. Topic: Clinical - Medication Refill >> Jul 23, 2024  1:52 PM Tiffini S wrote: Medication: tiZANidine  (ZANAFLEX ) 4 MG tablet  Has the patient contacted their pharmacy? No (Agent: If no, request that the patient contact the pharmacy for the refill. If patient does not wish to contact the pharmacy document the reason why and proceed with request.) (Agent: If yes, when and what did the pharmacy advise?)  This is the patient's preferred pharmacy:  [id number] - [place], [place] - [address] AT [facility] OF [address] [place] [address] Phone: [phone] Fax: [phone]  Is this the correct pharmacy for this prescription? Yes If no, delete pharmacy and type the correct one.   Has the prescription been filled recently? Yes  Is the patient out of the medication? Yes, patient have one tablet left   Has the patient been seen for an appointment in the last year OR does the patient have an upcoming appointment? Yes  Can we respond through MyChart? Yes  Agent: Please be advised that Rx refills may take up to 3 business days. We ask that you follow-up with your pharmacy.

## 2024-07-24 NOTE — Telephone Encounter (Signed)
 Requested medications are due for refill today.  yes  Requested medications are on the active medications list.  yes  Last refill. 09/11/2022 #30 1 rf  Future visit scheduled.   A wellness visit is scheduled  Notes to clinic.  Refill not delegated    Requested Prescriptions  Pending Prescriptions Disp Refills   tiZANidine  (ZANAFLEX ) 4 MG tablet 30 tablet 1    Sig: Take 1 tablet (4 mg total) by mouth 2 (two) times daily as needed for muscle spasms.     Not Delegated - Cardiovascular:  Alpha-2 Agonists - tizanidine  Failed - 07/24/2024  5:40 PM      Failed - This refill cannot be delegated      Passed - Valid encounter within last 6 months    Recent Outpatient Visits           1 month ago Hypothyroidism, unspecified type   [facility] [place]. [facility] Barnie NOVAK, MD   5 months ago Hypothyroidism, unspecified type   [facility] - [facility] Of [place]. [facility] Barnie NOVAK, MD   9 months ago Hypothyroidism, unspecified type   [facility] Of [place]. [facility] Barnie NOVAK, MD   1 year ago Hypothyroidism, unspecified type   [facility] Of [place]. [facility] Barnie NOVAK, MD   1 year ago Left lumbar radiculopathy   [facility] - [facility] Dept Of [place]. [facility] Barnie NOVAK, MD

## 2024-07-25 MED ORDER — TIZANIDINE HCL 4 MG PO TABS: 4.0000 mg | ORAL_TABLET | Freq: Two times a day (BID) | ORAL | 1 refills | Status: AC | PRN

## 2024-08-25 ENCOUNTER — Other Ambulatory Visit: Payer: Self-pay | Admitting: Internal Medicine

## 2024-08-25 ENCOUNTER — Encounter: Payer: Self-pay | Admitting: Radiology

## 2024-08-25 DIAGNOSIS — R600 Localized edema: Secondary | ICD-10-CM

## 2024-09-10 ENCOUNTER — Other Ambulatory Visit: Payer: Self-pay | Admitting: Internal Medicine

## 2024-09-10 DIAGNOSIS — E039 Hypothyroidism, unspecified: Secondary | ICD-10-CM

## 2024-12-23 ENCOUNTER — Ambulatory Visit: Payer: Medicare HMO
